# Patient Record
Sex: Male | Born: 1935 | Race: White | Hispanic: No | Marital: Married | Smoking: Former smoker
Health system: Southern US, Community
[De-identification: ages and names within clinical notes are randomized; demographics above are authoritative.]

## PROBLEM LIST (undated history)

## (undated) DIAGNOSIS — I10 Essential (primary) hypertension: Secondary | ICD-10-CM

## (undated) DIAGNOSIS — K5790 Diverticulosis of intestine, part unspecified, without perforation or abscess without bleeding: Secondary | ICD-10-CM

## (undated) DIAGNOSIS — Z860101 Personal history of adenomatous and serrated colon polyps: Secondary | ICD-10-CM

## (undated) DIAGNOSIS — I350 Nonrheumatic aortic (valve) stenosis: Secondary | ICD-10-CM

## (undated) DIAGNOSIS — M109 Gout, unspecified: Secondary | ICD-10-CM

## (undated) DIAGNOSIS — Z86010 Personal history of colonic polyps: Secondary | ICD-10-CM

## (undated) DIAGNOSIS — I6529 Occlusion and stenosis of unspecified carotid artery: Secondary | ICD-10-CM

## (undated) DIAGNOSIS — M1990 Unspecified osteoarthritis, unspecified site: Secondary | ICD-10-CM

## (undated) DIAGNOSIS — D509 Iron deficiency anemia, unspecified: Secondary | ICD-10-CM

## (undated) DIAGNOSIS — I4891 Unspecified atrial fibrillation: Secondary | ICD-10-CM

## (undated) DIAGNOSIS — Q2730 Arteriovenous malformation, site unspecified: Secondary | ICD-10-CM

## (undated) DIAGNOSIS — I639 Cerebral infarction, unspecified: Secondary | ICD-10-CM

## (undated) DIAGNOSIS — K648 Other hemorrhoids: Secondary | ICD-10-CM

## (undated) DIAGNOSIS — I00 Rheumatic fever without heart involvement: Secondary | ICD-10-CM

## (undated) DIAGNOSIS — E785 Hyperlipidemia, unspecified: Secondary | ICD-10-CM

## (undated) DIAGNOSIS — D126 Benign neoplasm of colon, unspecified: Secondary | ICD-10-CM

## (undated) HISTORY — DX: Hyperlipidemia, unspecified: E78.5

## (undated) HISTORY — PX: COLONOSCOPY: SHX174

## (undated) HISTORY — DX: Other hemorrhoids: K64.8

## (undated) HISTORY — DX: Diverticulosis of intestine, part unspecified, without perforation or abscess without bleeding: K57.90

## (undated) HISTORY — DX: Unspecified atrial fibrillation: I48.91

## (undated) HISTORY — DX: Personal history of colonic polyps: Z86.010

## (undated) HISTORY — DX: Nonrheumatic aortic (valve) stenosis: I35.0

## (undated) HISTORY — DX: Iron deficiency anemia, unspecified: D50.9

## (undated) HISTORY — DX: Arteriovenous malformation, site unspecified: Q27.30

## (undated) HISTORY — DX: Essential (primary) hypertension: I10

## (undated) HISTORY — DX: Personal history of adenomatous and serrated colon polyps: Z86.0101

## (undated) HISTORY — PX: ELBOW SURGERY: SHX618

## (undated) HISTORY — DX: Unspecified osteoarthritis, unspecified site: M19.90

## (undated) HISTORY — DX: Benign neoplasm of colon, unspecified: D12.6

---
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
## 2002-01-31 ENCOUNTER — Encounter: Payer: Self-pay | Admitting: *Deleted

## 2002-01-31 ENCOUNTER — Encounter: Payer: Self-pay | Admitting: Emergency Medicine

## 2002-01-31 ENCOUNTER — Emergency Department (HOSPITAL_COMMUNITY): Admission: EM | Admit: 2002-01-31 | Discharge: 2002-01-31 | Payer: Self-pay | Admitting: Emergency Medicine

## 2002-02-02 ENCOUNTER — Ambulatory Visit (HOSPITAL_BASED_OUTPATIENT_CLINIC_OR_DEPARTMENT_OTHER): Admission: RE | Admit: 2002-02-02 | Discharge: 2002-02-02 | Payer: Self-pay | Admitting: *Deleted

## 2002-07-23 ENCOUNTER — Encounter: Payer: Self-pay | Admitting: Family Medicine

## 2002-07-23 ENCOUNTER — Ambulatory Visit (HOSPITAL_COMMUNITY): Admission: RE | Admit: 2002-07-23 | Discharge: 2002-07-23 | Payer: Self-pay | Admitting: Family Medicine

## 2002-10-14 ENCOUNTER — Encounter: Payer: Self-pay | Admitting: Neurosurgery

## 2002-10-14 ENCOUNTER — Encounter: Admission: RE | Admit: 2002-10-14 | Discharge: 2002-10-14 | Payer: Self-pay | Admitting: Neurosurgery

## 2002-10-28 ENCOUNTER — Encounter: Payer: Self-pay | Admitting: Neurosurgery

## 2002-10-28 ENCOUNTER — Encounter: Admission: RE | Admit: 2002-10-28 | Discharge: 2002-10-28 | Payer: Self-pay | Admitting: Neurosurgery

## 2003-10-21 ENCOUNTER — Ambulatory Visit (HOSPITAL_COMMUNITY): Admission: RE | Admit: 2003-10-21 | Discharge: 2003-10-21 | Payer: Self-pay | Admitting: Family Medicine

## 2004-04-11 ENCOUNTER — Ambulatory Visit (HOSPITAL_COMMUNITY): Admission: RE | Admit: 2004-04-11 | Discharge: 2004-04-11 | Payer: Self-pay | Admitting: Neurosurgery

## 2004-05-03 ENCOUNTER — Encounter: Admission: RE | Admit: 2004-05-03 | Discharge: 2004-05-03 | Payer: Self-pay | Admitting: Neurosurgery

## 2004-05-17 ENCOUNTER — Encounter: Admission: RE | Admit: 2004-05-17 | Discharge: 2004-05-17 | Payer: Self-pay | Admitting: Neurosurgery

## 2005-02-05 ENCOUNTER — Ambulatory Visit (HOSPITAL_COMMUNITY): Admission: RE | Admit: 2005-02-05 | Discharge: 2005-02-05 | Payer: Self-pay | Admitting: Family Medicine

## 2005-11-14 ENCOUNTER — Ambulatory Visit (HOSPITAL_COMMUNITY): Admission: RE | Admit: 2005-11-14 | Discharge: 2005-11-14 | Payer: Self-pay | Admitting: Family Medicine

## 2006-05-16 ENCOUNTER — Ambulatory Visit: Payer: Self-pay | Admitting: Gastroenterology

## 2006-05-29 ENCOUNTER — Encounter: Payer: Self-pay | Admitting: Gastroenterology

## 2006-05-29 ENCOUNTER — Ambulatory Visit: Payer: Self-pay | Admitting: Gastroenterology

## 2009-05-03 ENCOUNTER — Encounter (INDEPENDENT_AMBULATORY_CARE_PROVIDER_SITE_OTHER): Payer: Self-pay | Admitting: *Deleted

## 2009-09-27 ENCOUNTER — Ambulatory Visit (HOSPITAL_COMMUNITY): Admission: RE | Admit: 2009-09-27 | Discharge: 2009-09-27 | Payer: Self-pay | Admitting: Family Medicine

## 2010-12-29 NOTE — Op Note (Signed)
[place]. [facility]  Patient:    BERVIN, KRONINGER Visit Number: [id number] MRN: [id number]          Service Type: DSU Location: [facility] Attending Physician:  Carlisle Cater Dictated by:   Daryl Eastern, M.D. Proc. Date: 02/02/02 Admit Date:  02/02/2002   CC:         Bonne Dolores, M.D.   Operative Report  PREOPERATIVE DIAGNOSIS:  Crush injury with avulsion fracture of extensor tendon, distal phalanx, right index finger.  POSTOPERATIVE DIAGNOSIS:  Crush injury with avulsion fracture of extensor tendon, distal phalanx, right index finger.  OPERATION PERFORMED:  Percutaneous pinning of distal interphalangeal joint right index finger with secondary closure of wound.  SURGEON:  Daryl Eastern, M.D.  ANESTHESIA:  0.5% local with sedation.  OPERATIVE FINDINGS:  The patient had a wood splitter injury with an avulsion fracture at the extensor tendon insertion on the distal phalanx.  The skin was fairly macerated and  the fracture was dorsally displaced.  DESCRIPTION OF PROCEDURE:  Under 0.5% Marcaine local anesthesia the right arm was prepped and draped in the usual fashion and the sutures were removed.  The DIP joint was pinned with a 4-5 K-wire percutaneously from the tip across the DIP joint with the DIP joint held in hyperextension.  The pin was bent over and left protruding from the skin after x-rays showed good alignment.  The fracture fragment was reduced and was manually pushed into place.  The joint had been irrigated with saline and the skin was closed with 4-0 nylon sutures. Sterile dressings were applied followed by finger splints.  The patient tolerated the procedure well and went to the recovery room awake and stable in good condition. Dictated by:   Daryl Eastern, M.D. Attending Physician:  Carlisle Cater DD:  02/02/02 TD:  02/02/02 Job: [id number]

## 2011-05-18 ENCOUNTER — Encounter: Payer: Self-pay | Admitting: Gastroenterology

## 2011-06-21 ENCOUNTER — Encounter: Payer: Self-pay | Admitting: Gastroenterology

## 2011-06-21 ENCOUNTER — Ambulatory Visit (INDEPENDENT_AMBULATORY_CARE_PROVIDER_SITE_OTHER): Payer: Medicare Other | Admitting: Gastroenterology

## 2011-06-21 VITALS — BP 152/70 | HR 60 | Wt 210.6 lb

## 2011-06-21 DIAGNOSIS — Z860100 Personal history of colon polyps, unspecified: Secondary | ICD-10-CM

## 2011-06-21 DIAGNOSIS — K5730 Diverticulosis of large intestine without perforation or abscess without bleeding: Secondary | ICD-10-CM

## 2011-06-21 NOTE — Patient Instructions (Signed)
Please call the office after the first of the year (January) to schedule your colonoscopy.  CC: Delman Cheadle M.D.  Diverticulosis Diverticulosis is a common condition that develops when small pouches (diverticula) form in the wall of the colon. The risk of diverticulosis increases with age. It happens more often in people who eat a low-fiber diet. Most individuals with diverticulosis have no symptoms. Those individuals with symptoms usually experience abdominal pain, constipation, or loose stools (diarrhea). HOME CARE INSTRUCTIONS   Increase the amount of fiber in your diet as directed by your caregiver or dietician. This may reduce symptoms of diverticulosis.   Your caregiver may recommend taking a dietary fiber supplement.   Drink at least 6 to 8 glasses of water each day to prevent constipation.   Try not to strain when you have a bowel movement.   Your caregiver may recommend avoiding nuts and seeds to prevent complications, although this is still an uncertain benefit.   Only take over-the-counter or prescription medicines for pain, discomfort, or fever as directed by your caregiver.  FOODS WITH HIGH FIBER CONTENT INCLUDE:  Fruits. Apple, peach, pear, tangerine, raisins, prunes.   Vegetables. Brussels sprouts, asparagus, broccoli, cabbage, carrot, cauliflower, romaine lettuce, spinach, summer squash, tomato, winter squash, zucchini.   Starchy Vegetables. Baked beans, kidney beans, lima beans, split peas, lentils, potatoes (with skin).   Grains. Whole wheat bread, brown rice, bran flake cereal, plain oatmeal, white rice, shredded wheat, bran muffins.  SEEK IMMEDIATE MEDICAL CARE IF:   You develop increasing pain or severe bloating.   You have an oral temperature above 102 F (38.9 C), not controlled by medicine.   You develop vomiting or bowel movements that are bloody or black.  Document Released: 04/26/2004 Document Revised: 04/11/2011 Document Reviewed:  12/28/2009 [facility] Patient Information 2012 [place].

## 2011-06-21 NOTE — Progress Notes (Signed)
This is a  extremely pleasant 77 year old Caucasian male in excellent health a sister mild hypertension. 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 Family history is noncontributory. He denies any GI complaints except for mild constipation relieved by herbal tea. He specifically denies rectal bleeding, melena, anorexia, weight loss, upper GI, hepatobiliary, or systemic complaints.  Current Medications, Allergies, Past Medical History, Past Surgical History, Family History and Social History were reviewed in [employer] record.  Pertinent Review of Systems Negative   Physical Exam: Healthy-appearing patient appearing younger than his stated age. I cannot appreciate stigmata of chronic liver disease. Chest is clear he is in a regular rhythm without murmurs gallops or rubs. I cannot appreciate hepatosplenomegaly, abdominal masses or tenderness. Bowel sounds are normal. Peripheral extremities are unremarkable and mental status is normal.    Assessment and Plan: We'll schedule followup colonoscopy in this extremely healthy 77 year old Caucasian male with mild essential hypertension. Blood pressure today on therapy is 152/70 and pulse is 60 and regular. He had a previous serrated adenoma, and this does increase his risk for colon cancer. I have reviewed colonoscopy, balanced electrolyte preparation, IV sedation, and he is agreed to proceed as planned. Patient is not on aspirin or other anticoagulants. Continue his other medications as listed and reviewed in his record.  Please copy her primary care physician, referring physician, and pertinent subspecialists. Encounter Diagnoses  Name Primary?  . Personal history of colonic polyps Yes  . Diverticulosis of colon (without mention of hemorrhage)

## 2011-08-30 DIAGNOSIS — D235 Other benign neoplasm of skin of trunk: Secondary | ICD-10-CM | POA: Diagnosis not present

## 2011-08-30 DIAGNOSIS — L259 Unspecified contact dermatitis, unspecified cause: Secondary | ICD-10-CM | POA: Diagnosis not present

## 2011-08-30 DIAGNOSIS — L570 Actinic keratosis: Secondary | ICD-10-CM | POA: Diagnosis not present

## 2011-09-27 DIAGNOSIS — E119 Type 2 diabetes mellitus without complications: Secondary | ICD-10-CM | POA: Diagnosis not present

## 2011-09-27 DIAGNOSIS — L820 Inflamed seborrheic keratosis: Secondary | ICD-10-CM | POA: Diagnosis not present

## 2011-09-27 DIAGNOSIS — M069 Rheumatoid arthritis, unspecified: Secondary | ICD-10-CM | POA: Diagnosis not present

## 2011-09-27 DIAGNOSIS — Z6828 Body mass index (BMI) 28.0-28.9, adult: Secondary | ICD-10-CM | POA: Diagnosis not present

## 2011-09-27 DIAGNOSIS — L570 Actinic keratosis: Secondary | ICD-10-CM | POA: Diagnosis not present

## 2011-09-27 DIAGNOSIS — I10 Essential (primary) hypertension: Secondary | ICD-10-CM | POA: Diagnosis not present

## 2011-09-27 DIAGNOSIS — E785 Hyperlipidemia, unspecified: Secondary | ICD-10-CM | POA: Diagnosis not present

## 2011-10-15 DIAGNOSIS — E785 Hyperlipidemia, unspecified: Secondary | ICD-10-CM | POA: Diagnosis not present

## 2011-10-15 DIAGNOSIS — Z125 Encounter for screening for malignant neoplasm of prostate: Secondary | ICD-10-CM | POA: Diagnosis not present

## 2011-10-15 DIAGNOSIS — E119 Type 2 diabetes mellitus without complications: Secondary | ICD-10-CM | POA: Diagnosis not present

## 2011-10-15 DIAGNOSIS — I10 Essential (primary) hypertension: Secondary | ICD-10-CM | POA: Diagnosis not present

## 2011-12-12 ENCOUNTER — Encounter: Payer: Self-pay | Admitting: Gastroenterology

## 2011-12-21 ENCOUNTER — Ambulatory Visit: Payer: Medicare Other | Admitting: Gastroenterology

## 2012-01-16 ENCOUNTER — Encounter: Payer: Medicare Other | Admitting: Gastroenterology

## 2012-01-21 ENCOUNTER — Ambulatory Visit (AMBULATORY_SURGERY_CENTER): Payer: Medicare Other | Admitting: *Deleted

## 2012-01-21 VITALS — Ht 74.0 in | Wt 223.0 lb

## 2012-01-21 DIAGNOSIS — Z860100 Personal history of colon polyps, unspecified: Secondary | ICD-10-CM

## 2012-01-21 DIAGNOSIS — Z1211 Encounter for screening for malignant neoplasm of colon: Secondary | ICD-10-CM

## 2012-01-21 MED ORDER — PEG-KCL-NACL-NASULF-NA ASC-C 100 G PO SOLR: ORAL | Status: DC

## 2012-02-04 ENCOUNTER — Ambulatory Visit (AMBULATORY_SURGERY_CENTER): Payer: Medicare Other | Admitting: Gastroenterology

## 2012-02-04 VITALS — BP 146/75 | HR 79 | Temp 98.2°F | Resp 16 | Ht 74.0 in | Wt 223.0 lb

## 2012-02-04 DIAGNOSIS — I10 Essential (primary) hypertension: Secondary | ICD-10-CM | POA: Diagnosis not present

## 2012-02-04 DIAGNOSIS — E785 Hyperlipidemia, unspecified: Secondary | ICD-10-CM | POA: Diagnosis not present

## 2012-02-04 DIAGNOSIS — K5520 Angiodysplasia of colon without hemorrhage: Secondary | ICD-10-CM

## 2012-02-04 DIAGNOSIS — D126 Benign neoplasm of colon, unspecified: Secondary | ICD-10-CM | POA: Diagnosis not present

## 2012-02-04 DIAGNOSIS — K5730 Diverticulosis of large intestine without perforation or abscess without bleeding: Secondary | ICD-10-CM | POA: Diagnosis not present

## 2012-02-04 DIAGNOSIS — Z1211 Encounter for screening for malignant neoplasm of colon: Secondary | ICD-10-CM

## 2012-02-04 DIAGNOSIS — Q2733 Arteriovenous malformation of digestive system vessel: Secondary | ICD-10-CM | POA: Diagnosis not present

## 2012-02-04 DIAGNOSIS — Z860100 Personal history of colon polyps, unspecified: Secondary | ICD-10-CM

## 2012-02-04 MED ORDER — SODIUM CHLORIDE 0.9 % IV SOLN: 500.0000 mL | INTRAVENOUS | Status: DC

## 2012-02-04 NOTE — Progress Notes (Signed)
Patient did not experience any of the following events: a burn prior to discharge; a fall within the facility; wrong site/side/patient/procedure/implant event; or a hospital transfer or hospital admission upon discharge from the facility. (G8907) Patient did not have preoperative order for IV antibiotic SSI prophylaxis. (G8918)  

## 2012-02-04 NOTE — Op Note (Signed)
[facility] [address] [employer] [address]. [place], [place]  [id number]  COLONOSCOPY PROCEDURE REPORT  PATIENT:  Ronald Colon, Ronald Colon  MR#:  [id number] BIRTHDATE:  1929-04-09, 83 yrs. old  GENDER:  male ENDOSCOPIST:  Loralee Pacas. Sharlett Iles, MD, [facility] REF. BY:  Stanford Scotland, NP PROCEDURE DATE:  02/04/2012 PROCEDURE:  Colonoscopy with snare polypectomy ASA CLASS:  Class III INDICATIONS:  history of pre-cancerous (adenomatous) colon polyps  MEDICATIONS:   propofol (Diprivan) 170 mg IV  DESCRIPTION OF PROCEDURE:   After the risks and benefits and of the procedure were explained, informed consent was obtained. Digital rectal exam was performed and revealed no abnormalities. The LB PCF-H180AL [id number] endoscope was introduced through the anus and advanced to the cecum, which was identified by both the appendix and ileocecal valve.  The quality of the prep was good, using MoviPrep.  The instrument was then slowly withdrawn as the colon was fully examined. <<PROCEDUREIMAGES>>  FINDINGS:  There were mild diverticular changes in left colon. diverticulosis was found.  A sessile polyp was found in the distal transverse colon. A 7MM FLAT POLYP HOT SNARE REMOVED FROM DISTAL TV COLON AREA.  A diminutive polyp was found in the rectum. SMALL NODULE ABLATED WITH CAUTERY.IN RECTUM.  This was otherwise a normal examination of the colon.  Arteriovenous malformations were seen in the in the cecum. SEE PICTURES.   Retroflexed views in the rectum revealed no abnormalities.    The scope was then withdrawn from the patient and the procedure completed.  COMPLICATIONS:  None ENDOSCOPIC IMPRESSION: 1) Diverticulosis,mild,left sided diverticulosis 2) Sessile polyp in the distal transverse colon 3) Diminutive polyp in the rectum 4) Otherwise normal examination R/O ADENOMAS. RECOMMENDATIONS: 1) Await biopsy results 2) High fiber diet. F/U PER AGE AND PATH RESULTS.  REPEAT EXAM:   No  ______________________________ Loralee Pacas. Sharlett Iles, MD, [facility]  CC:  n. eSIGNED:   Loralee Pacas. Antavia Tandy at 02/04/2012 11:17 AM  Gavin Pound, [id number]

## 2012-02-04 NOTE — Progress Notes (Signed)
The pt tolerated the colonoscopy very well. Maw   

## 2012-02-04 NOTE — Patient Instructions (Signed)
YOU HAD AN ENDOSCOPIC PROCEDURE TODAY AT THE [facility]: Refer to the procedure report that was given to you for any specific questions about what was found during the examination.  If the procedure report does not answer your questions, please call your gastroenterologist to clarify.  If you requested that your care partner not be given the details of your procedure findings, then the procedure report has been included in a sealed envelope for you to review at your convenience later.  YOU SHOULD EXPECT: Some feelings of bloating in the abdomen. Passage of more gas than usual.  Walking can help get rid of the air that was put into your GI tract during the procedure and reduce the bloating. If you had a lower endoscopy (such as a colonoscopy or flexible sigmoidoscopy) you may notice spotting of blood in your stool or on the toilet paper. If you underwent a bowel prep for your procedure, then you may not have a normal bowel movement for a few days.  DIET: Your first meal following the procedure should be a light meal and then it is ok to progress to your normal diet.  A half-sandwich or bowl of soup is an example of a good first meal.  Heavy or fried foods are harder to digest and may make you feel nauseous or bloated.  Likewise meals heavy in dairy and vegetables can cause extra gas to form and this can also increase the bloating.  Drink plenty of fluids but you should avoid alcoholic beverages for 24 hours.  ACTIVITY: Your care partner should take you home directly after the procedure.  You should plan to take it easy, moving slowly for the rest of the day.  You can resume normal activity the day after the procedure however you should NOT DRIVE or use heavy machinery for 24 hours (because of the sedation medicines used during the test).    SYMPTOMS TO REPORT IMMEDIATELY: A gastroenterologist can be reached at any hour.  During normal business hours, 8:30 AM to 5:00 PM Monday through Friday,  call [phone].  After hours and on weekends, please call the GI answering service at [phone] who will take a message and have the physician on call contact you.   Following lower endoscopy (colonoscopy or flexible sigmoidoscopy):  Excessive amounts of blood in the stool  Significant tenderness or worsening of abdominal pains  Swelling of the abdomen that is new, acute  Fever of 100F or higher    FOLLOW UP: If any biopsies were taken you will be contacted by phone or by letter within the next 1-3 weeks.  Call your gastroenterologist if you have not heard about the biopsies in 3 weeks.  Our staff will call the home number listed on your records the next business day following your procedure to check on you and address any questions or concerns that you may have at that time regarding the information given to you following your procedure. This is a courtesy call and so if there is no answer at the home number and we have not heard from you through the emergency physician on call, we will assume that you have returned to your regular daily activities without incident.  SIGNATURES/CONFIDENTIALITY: You and/or your care partner have signed paperwork which will be entered into your electronic medical record.  These signatures attest to the fact that that the information above on your After Visit Summary has been reviewed and is understood.  Full responsibility of the confidentiality   of this discharge information lies with you and/or your care-partner.     INFORMATION ON POLYPS, DIVERTICULOSIS, & HIGH FIBER DIET GIVEN TO YOU TODAY

## 2012-02-05 ENCOUNTER — Telehealth: Payer: Self-pay

## 2012-02-05 NOTE — Telephone Encounter (Signed)
  Follow up Call-  Call back number 02/04/2012  Post procedure Call Back phone  # [phone], [phone] cell  Permission to leave phone message Yes     Patient questions:  Do you have a fever, pain , or abdominal swelling? no Pain Score  0 *  Have you tolerated food without any problems? no  Have you been able to return to your normal activities? yes  Do you have any questions about your discharge instructions: Diet   no Medications  no Follow up visit  no  Do you have questions or concerns about your Care? no  Actions: * If pain score is 4 or above: No action needed, pain <4. Per the pt, "I did just fine.  I was real pleased how things went yesterday.  Everyone was real nice". Maw

## 2012-02-08 ENCOUNTER — Encounter: Payer: Self-pay | Admitting: Gastroenterology

## 2012-03-27 DIAGNOSIS — D235 Other benign neoplasm of skin of trunk: Secondary | ICD-10-CM | POA: Diagnosis not present

## 2012-03-27 DIAGNOSIS — L570 Actinic keratosis: Secondary | ICD-10-CM | POA: Diagnosis not present

## 2012-03-27 DIAGNOSIS — L820 Inflamed seborrheic keratosis: Secondary | ICD-10-CM | POA: Diagnosis not present

## 2012-04-24 DIAGNOSIS — Z6828 Body mass index (BMI) 28.0-28.9, adult: Secondary | ICD-10-CM | POA: Diagnosis not present

## 2012-04-24 DIAGNOSIS — E785 Hyperlipidemia, unspecified: Secondary | ICD-10-CM | POA: Diagnosis not present

## 2012-04-24 DIAGNOSIS — I10 Essential (primary) hypertension: Secondary | ICD-10-CM | POA: Diagnosis not present

## 2012-04-24 DIAGNOSIS — M159 Polyosteoarthritis, unspecified: Secondary | ICD-10-CM | POA: Diagnosis not present

## 2012-04-25 DIAGNOSIS — E119 Type 2 diabetes mellitus without complications: Secondary | ICD-10-CM | POA: Diagnosis not present

## 2012-04-25 DIAGNOSIS — I10 Essential (primary) hypertension: Secondary | ICD-10-CM | POA: Diagnosis not present

## 2012-04-25 DIAGNOSIS — E785 Hyperlipidemia, unspecified: Secondary | ICD-10-CM | POA: Diagnosis not present

## 2012-06-11 DIAGNOSIS — Z6828 Body mass index (BMI) 28.0-28.9, adult: Secondary | ICD-10-CM | POA: Diagnosis not present

## 2012-06-11 DIAGNOSIS — M130 Polyarthritis, unspecified: Secondary | ICD-10-CM | POA: Diagnosis not present

## 2012-06-11 DIAGNOSIS — E782 Mixed hyperlipidemia: Secondary | ICD-10-CM | POA: Diagnosis not present

## 2012-06-11 DIAGNOSIS — M109 Gout, unspecified: Secondary | ICD-10-CM | POA: Diagnosis not present

## 2012-06-11 DIAGNOSIS — Z23 Encounter for immunization: Secondary | ICD-10-CM | POA: Diagnosis not present

## 2012-06-20 ENCOUNTER — Ambulatory Visit (HOSPITAL_COMMUNITY)
Admission: RE | Admit: 2012-06-20 | Discharge: 2012-06-20 | Disposition: A | Discharge status: Home / Self Care | Payer: Medicare Other | Source: Ambulatory Visit | Attending: Family Medicine | Admitting: Family Medicine

## 2012-06-20 ENCOUNTER — Other Ambulatory Visit (HOSPITAL_COMMUNITY): Payer: Self-pay | Admitting: Family Medicine

## 2012-06-20 DIAGNOSIS — M109 Gout, unspecified: Secondary | ICD-10-CM | POA: Diagnosis not present

## 2012-06-20 DIAGNOSIS — Z6828 Body mass index (BMI) 28.0-28.9, adult: Secondary | ICD-10-CM | POA: Diagnosis not present

## 2012-06-20 DIAGNOSIS — S8000XA Contusion of unspecified knee, initial encounter: Secondary | ICD-10-CM

## 2012-06-20 DIAGNOSIS — S8990XA Unspecified injury of unspecified lower leg, initial encounter: Secondary | ICD-10-CM | POA: Diagnosis not present

## 2012-06-20 DIAGNOSIS — S99929A Unspecified injury of unspecified foot, initial encounter: Secondary | ICD-10-CM | POA: Diagnosis not present

## 2012-06-20 DIAGNOSIS — M7989 Other specified soft tissue disorders: Secondary | ICD-10-CM | POA: Diagnosis not present

## 2012-06-20 DIAGNOSIS — X58XXXA Exposure to other specified factors, initial encounter: Secondary | ICD-10-CM | POA: Insufficient documentation

## 2012-06-20 DIAGNOSIS — S99919A Unspecified injury of unspecified ankle, initial encounter: Secondary | ICD-10-CM | POA: Diagnosis not present

## 2012-07-09 DIAGNOSIS — J069 Acute upper respiratory infection, unspecified: Secondary | ICD-10-CM | POA: Diagnosis not present

## 2012-07-09 DIAGNOSIS — Z6828 Body mass index (BMI) 28.0-28.9, adult: Secondary | ICD-10-CM | POA: Diagnosis not present

## 2012-09-23 DIAGNOSIS — H538 Other visual disturbances: Secondary | ICD-10-CM | POA: Diagnosis not present

## 2012-09-23 DIAGNOSIS — H2510 Age-related nuclear cataract, unspecified eye: Secondary | ICD-10-CM | POA: Diagnosis not present

## 2012-10-23 DIAGNOSIS — L820 Inflamed seborrheic keratosis: Secondary | ICD-10-CM | POA: Diagnosis not present

## 2012-10-23 DIAGNOSIS — L570 Actinic keratosis: Secondary | ICD-10-CM | POA: Diagnosis not present

## 2012-11-19 DIAGNOSIS — R21 Rash and other nonspecific skin eruption: Secondary | ICD-10-CM | POA: Diagnosis not present

## 2012-11-19 DIAGNOSIS — Z6828 Body mass index (BMI) 28.0-28.9, adult: Secondary | ICD-10-CM | POA: Diagnosis not present

## 2012-11-19 DIAGNOSIS — M159 Polyosteoarthritis, unspecified: Secondary | ICD-10-CM | POA: Diagnosis not present

## 2012-11-19 DIAGNOSIS — E119 Type 2 diabetes mellitus without complications: Secondary | ICD-10-CM | POA: Diagnosis not present

## 2012-11-19 DIAGNOSIS — I10 Essential (primary) hypertension: Secondary | ICD-10-CM | POA: Diagnosis not present

## 2012-11-19 DIAGNOSIS — G5600 Carpal tunnel syndrome, unspecified upper limb: Secondary | ICD-10-CM | POA: Diagnosis not present

## 2012-12-25 ENCOUNTER — Ambulatory Visit: Payer: Medicare Other | Attending: Internal Medicine

## 2013-04-07 DIAGNOSIS — M549 Dorsalgia, unspecified: Secondary | ICD-10-CM | POA: Diagnosis not present

## 2013-04-07 DIAGNOSIS — M1990 Unspecified osteoarthritis, unspecified site: Secondary | ICD-10-CM | POA: Diagnosis not present

## 2013-04-07 DIAGNOSIS — Z6828 Body mass index (BMI) 28.0-28.9, adult: Secondary | ICD-10-CM | POA: Diagnosis not present

## 2013-04-07 DIAGNOSIS — R141 Gas pain: Secondary | ICD-10-CM | POA: Diagnosis not present

## 2013-06-10 DIAGNOSIS — I10 Essential (primary) hypertension: Secondary | ICD-10-CM | POA: Diagnosis not present

## 2013-06-10 DIAGNOSIS — M2550 Pain in unspecified joint: Secondary | ICD-10-CM | POA: Diagnosis not present

## 2013-06-10 DIAGNOSIS — I499 Cardiac arrhythmia, unspecified: Secondary | ICD-10-CM | POA: Diagnosis not present

## 2013-06-10 DIAGNOSIS — Z6827 Body mass index (BMI) 27.0-27.9, adult: Secondary | ICD-10-CM | POA: Diagnosis not present

## 2013-06-11 DIAGNOSIS — Z6827 Body mass index (BMI) 27.0-27.9, adult: Secondary | ICD-10-CM | POA: Diagnosis not present

## 2013-06-11 DIAGNOSIS — Z125 Encounter for screening for malignant neoplasm of prostate: Secondary | ICD-10-CM | POA: Diagnosis not present

## 2013-06-11 DIAGNOSIS — G8929 Other chronic pain: Secondary | ICD-10-CM | POA: Diagnosis not present

## 2013-06-11 DIAGNOSIS — I10 Essential (primary) hypertension: Secondary | ICD-10-CM | POA: Diagnosis not present

## 2013-06-11 DIAGNOSIS — Z23 Encounter for immunization: Secondary | ICD-10-CM | POA: Diagnosis not present

## 2013-07-02 ENCOUNTER — Ambulatory Visit: Payer: Medicare Other | Admitting: Cardiology

## 2013-08-11 DIAGNOSIS — E669 Obesity, unspecified: Secondary | ICD-10-CM | POA: Diagnosis not present

## 2013-08-11 DIAGNOSIS — E119 Type 2 diabetes mellitus without complications: Secondary | ICD-10-CM | POA: Diagnosis not present

## 2013-08-11 DIAGNOSIS — K219 Gastro-esophageal reflux disease without esophagitis: Secondary | ICD-10-CM | POA: Diagnosis not present

## 2013-08-12 ENCOUNTER — Encounter: Payer: Self-pay | Admitting: *Deleted

## 2013-08-18 ENCOUNTER — Ambulatory Visit (INDEPENDENT_AMBULATORY_CARE_PROVIDER_SITE_OTHER): Payer: Medicare Other | Admitting: Cardiology

## 2013-08-18 ENCOUNTER — Encounter: Payer: Self-pay | Admitting: Cardiology

## 2013-08-18 VITALS — BP 155/76 | HR 96 | Ht 74.0 in | Wt 207.1 lb

## 2013-08-18 DIAGNOSIS — I499 Cardiac arrhythmia, unspecified: Secondary | ICD-10-CM | POA: Diagnosis not present

## 2013-08-18 DIAGNOSIS — R011 Cardiac murmur, unspecified: Secondary | ICD-10-CM

## 2013-08-18 DIAGNOSIS — I10 Essential (primary) hypertension: Secondary | ICD-10-CM

## 2013-08-18 NOTE — Progress Notes (Signed)
Clinical Summary Mr. Ronald Colon is an 80 y.o.male referred for cardiology consultation by Dr. Hilma Favors. He has a reported long-standing history of cardiac murmur, recognized when he was trying to get into the [employer], he was declined based on his cardiac evaluation and asked if he had had history of rheumatic heart disease. He does recall a febrile illness as a child. Otherwise he does not indicate any limiting shortness of breath, no chest pain or palpitations, no syncope. He states that he is active, just hunted recently during deer season, was able to climb up into dear stands and drag deers out of the woods.  ECG from October 2014 showed sinus rhythm with PACs, left anterior fascicular block, incomplete right bundle branch block. Lab work at that time revealed hemoglobin 13.5, platelets 260, potassium 4.6, BUN 10, currently 0.9, normal AST and ALT, cholesterol 159, triglycerides 219, HDL 35, LDL 123.  He has never had an echocardiogram.  No Known Allergies  Current Outpatient Prescriptions  Medication Sig Dispense Refill  . acetaminophen (TYLENOL) 500 MG tablet Take 500 mg by mouth every 6 (six) hours as needed.      Marland Kitchen allopurinol (ZYLOPRIM) 300 MG tablet Take 300 mg by mouth daily.      Marland Kitchen amLODipine-olmesartan (AZOR) 5-40 MG per tablet Take 1 tablet by mouth daily.        Marland Kitchen COLCRYS 0.6 MG tablet Take 1 tablet by mouth as needed.      . ezetimibe (ZETIA) 10 MG tablet Take 10 mg by mouth daily.      . fish oil-omega-3 fatty acids 1000 MG capsule Take 1 g by mouth daily.      . predniSONE (DELTASONE) 10 MG tablet Take 1 tablet by mouth as needed.      . traZODone (DESYREL) 50 MG tablet Take 50 mg by mouth at bedtime as needed.        No current facility-administered medications for this visit.    Past Medical History  Diagnosis Date  . Diverticulosis   . Colon polyp   . Arthritis   . Essential hypertension, benign   . Hyperlipidemia     Past Surgical History  Procedure Laterality Date   . Elbow surgery      Family History  Problem Relation Age of Onset  . Colon cancer Neg Hx     Social History Mr. Vanorden reports that he has quit smoking. His smoking use included Cigarettes. He smoked 0.00 packs per day. He has never used smokeless tobacco. Mr. Fahey reports that he does not drink alcohol.  Review of Systems Arthritic pains are his main limitation. No bleeding problems. Stable appetite. Otherwise negative except as outlined.  Physical Examination Filed Vitals:   08/18/13 0806  BP: 155/76  Pulse: 96   Filed Weights   08/18/13 0806  Weight: 207 lb 1.9 oz (93.949 kg)   The patient appears comfortable at rest. HEENT: Conjunctiva and lids normal, oropharynx clear. Neck: Supple, no elevated JVP or carotid bruits, no thyromegaly. Lungs: Clear to auscultation, nonlabored breathing at rest. Cardiac: Regular rate and rhythm, no S3, [id number] at base systolic murmur, no obvious diastolic murmur appreciated, no pericardial rub. Abdomen: Soft, nontender, bowel sounds present, no guarding or rebound. Extremities: No pitting edema, distal pulses 2+. Skin: Warm and dry. Musculoskeletal: No kyphosis. Neuropsychiatric: Alert and oriented x3, affect grossly appropriate. Hard of hearing.   Problem List and Plan   Heart murmur He is asymptomatic, reportedly has a long-standing murmur. On today's  exam I appreciate a soft systolic murmur, no obvious diastolic murmur. He gives a history that could be consistent with rheumatic heart disease as a child, has never had an echocardiogram. ECG shows incomplete right bundle branch and left anterior fascicular block. Plan will be to obtain an echocardiogram and we will inform him of the results and determine if any further testing is needed.  Irregular heartbeat PACs noted on his most recent ECG. He reports no palpitations, no dizziness or syncope. Doubt that further workup beyond the echocardiogram already mentioned is needed at this  time.  Essential hypertension, benign Keep followup with Dr. Hilma Favors.    Satira Sark, M.D., F.A.C.C.

## 2013-08-18 NOTE — Assessment & Plan Note (Signed)
PACs noted on his most recent ECG. He reports no palpitations, no dizziness or syncope. Doubt that further workup beyond the echocardiogram already mentioned is needed at this time.

## 2013-08-18 NOTE — Assessment & Plan Note (Signed)
Keep followup with Dr. Hilma Favors.

## 2013-08-18 NOTE — Assessment & Plan Note (Signed)
He is asymptomatic, reportedly has a long-standing murmur. On today's exam I appreciate a soft systolic murmur, no obvious diastolic murmur. He gives a history that could be consistent with rheumatic heart disease as a child, has never had an echocardiogram. ECG shows incomplete right bundle branch and left anterior fascicular block. Plan will be to obtain an echocardiogram and we will inform him of the results and determine if any further testing is needed.

## 2013-08-18 NOTE — Patient Instructions (Addendum)

## 2013-08-24 ENCOUNTER — Other Ambulatory Visit (INDEPENDENT_AMBULATORY_CARE_PROVIDER_SITE_OTHER): Payer: Self-pay | Admitting: *Deleted

## 2013-08-24 ENCOUNTER — Encounter (INDEPENDENT_AMBULATORY_CARE_PROVIDER_SITE_OTHER): Payer: Self-pay | Admitting: *Deleted

## 2013-08-24 ENCOUNTER — Ambulatory Visit (INDEPENDENT_AMBULATORY_CARE_PROVIDER_SITE_OTHER): Payer: Medicare Other | Admitting: Internal Medicine

## 2013-08-24 ENCOUNTER — Encounter (INDEPENDENT_AMBULATORY_CARE_PROVIDER_SITE_OTHER): Payer: Self-pay | Admitting: Internal Medicine

## 2013-08-24 VITALS — BP 122/62 | HR 76 | Temp 98.2°F | Ht 74.0 in | Wt 209.2 lb

## 2013-08-24 DIAGNOSIS — K219 Gastro-esophageal reflux disease without esophagitis: Secondary | ICD-10-CM

## 2013-08-24 DIAGNOSIS — R634 Abnormal weight loss: Secondary | ICD-10-CM

## 2013-08-24 MED ORDER — DEXLANSOPRAZOLE 60 MG PO CPDR: 60.0000 mg | DELAYED_RELEASE_CAPSULE | Freq: Every day | ORAL | Status: DC

## 2013-08-24 NOTE — Progress Notes (Signed)
Subjective:     Patient ID: Ronald Colon, male   DOB: 1929-02-01, 80 y.o.   MRN: [id number]  HPI referred to our office for GERD. He says when he eats, he has a sick feeling and water will come up in his mouth.  At night when he lies down, he has tenderness in his epigastric region. Symptoms started around the 1st of December after coming off Prednisone for his arthritis. Hx of bleeding ulcer per patient.  No dysphagia. He sometimes he chokes on tea or water. He can lie down at night and sometimes the acid reflux will bubble up in his esophagus. He says he has lost from 222 to  209.2 pounds over the past 3 months.  His appetite is good.  Usually has a BM daily and sometimes he has 2. No melena or bright red rectal bleeding. He says for the past 3 months he was taking Prednisone for his arthritis so he can climb a tree to deer hunt.  02/05/2012 Colonoscopy for person hx of polyps.  Dr. Sharlett Iles:  Mild left sided diverticulosis. Sesile polyp in the distal transverse colon. Diminutive polyp in the rectum. Otherwise normal exam. Biopsy: Tubular adenoma. Negative for high grade dysplasia.   CBC 05/2013 H and H 13.5 and 38.7, platelets 260    Review of Systems see hpi Current Outpatient Prescriptions  Medication Sig Dispense Refill  . acetaminophen (TYLENOL) 500 MG tablet Take 500 mg by mouth every 6 (six) hours as needed.      Marland Kitchen allopurinol (ZYLOPRIM) 300 MG tablet Take 300 mg by mouth daily.      Marland Kitchen amLODipine-olmesartan (AZOR) 5-40 MG per tablet Take 1 tablet by mouth daily.        Marland Kitchen COLCRYS 0.6 MG tablet Take 1 tablet by mouth as needed.      . ezetimibe (ZETIA) 10 MG tablet Take 10 mg by mouth daily.      . fish oil-omega-3 fatty acids 1000 MG capsule Take 1 g by mouth daily.      . ondansetron (ZOFRAN-ODT) 4 MG disintegrating tablet Take 4 mg by mouth every 8 (eight) hours as needed for nausea or vomiting.      . RABEprazole (ACIPHEX) 20 MG tablet Take 20 mg by mouth daily.      . traZODone  (DESYREL) 50 MG tablet Take 50 mg by mouth at bedtime as needed.        No current facility-administered medications for this visit.   No Known Allergies Past Surgical History  Procedure Laterality Date  . Elbow surgery          Objective:   Physical Exam  Filed Vitals:   08/24/13 1438  BP: 122/62  Pulse: 76  Temp: 98.2 F (36.8 C)  Height: 6\' 2"  (1.88 m)  Weight: 209 lb 3.2 oz (94.892 kg)   Alert and oriented. Skin warm and dry. Oral mucosa is moist.   . Sclera anicteric, conjunctivae is pink. Thyroid not enlarged. No cervical lymphadenopathy. Lungs clear. Heart regular rate and rhythm.  Abdomen is soft. Bowel sounds are positive. No hepatomegaly. No abdominal masses felt. Soreness to epigastric region..  No edema to lower extremities.       Assessment:    Uncontrolled GERD. Weight loss. Gastric neoplasm needs to be ruled out. PUD also in the differential.     Plan:    Rx for Dexilant. EGD with Dr. Laural Golden. Dexilant samples x 2 given to patient.

## 2013-08-24 NOTE — Patient Instructions (Signed)
EGD with Dr. Rehman. The risks and benefits such as perforation, bleeding, and infection were reviewed with the patient and is agreeable. 

## 2013-08-27 ENCOUNTER — Encounter (HOSPITAL_COMMUNITY): Payer: Self-pay | Admitting: Pharmacy Technician

## 2013-09-03 ENCOUNTER — Ambulatory Visit (HOSPITAL_COMMUNITY)
Admission: RE | Admit: 2013-09-03 | Discharge: 2013-09-03 | Disposition: A | Discharge status: Home / Self Care | Payer: Medicare Other | Source: Ambulatory Visit | Attending: Internal Medicine | Admitting: Internal Medicine

## 2013-09-03 ENCOUNTER — Encounter (HOSPITAL_COMMUNITY): Payer: Self-pay | Admitting: *Deleted

## 2013-09-03 ENCOUNTER — Encounter (HOSPITAL_COMMUNITY)
Admission: RE | Disposition: A | Discharge status: Home / Self Care | Payer: Self-pay | Source: Ambulatory Visit | Attending: Internal Medicine

## 2013-09-03 DIAGNOSIS — I10 Essential (primary) hypertension: Secondary | ICD-10-CM | POA: Diagnosis not present

## 2013-09-03 DIAGNOSIS — E785 Hyperlipidemia, unspecified: Secondary | ICD-10-CM | POA: Diagnosis not present

## 2013-09-03 DIAGNOSIS — Z8711 Personal history of peptic ulcer disease: Secondary | ICD-10-CM | POA: Diagnosis not present

## 2013-09-03 DIAGNOSIS — K2940 Chronic atrophic gastritis without bleeding: Secondary | ICD-10-CM | POA: Insufficient documentation

## 2013-09-03 DIAGNOSIS — R1013 Epigastric pain: Secondary | ICD-10-CM | POA: Diagnosis not present

## 2013-09-03 DIAGNOSIS — K117 Disturbances of salivary secretion: Secondary | ICD-10-CM

## 2013-09-03 DIAGNOSIS — K219 Gastro-esophageal reflux disease without esophagitis: Secondary | ICD-10-CM

## 2013-09-03 DIAGNOSIS — R634 Abnormal weight loss: Secondary | ICD-10-CM

## 2013-09-03 DIAGNOSIS — R1310 Dysphagia, unspecified: Secondary | ICD-10-CM | POA: Diagnosis not present

## 2013-09-03 HISTORY — PX: ESOPHAGOGASTRODUODENOSCOPY: SHX5428

## 2013-09-03 HISTORY — PX: ESOPHAGEAL DILATION: SHX303

## 2013-09-03 SURGERY — EGD (ESOPHAGOGASTRODUODENOSCOPY)
Anesthesia: Moderate Sedation

## 2013-09-03 MED ORDER — MEPERIDINE HCL 50 MG/ML IJ SOLN
INTRAMUSCULAR | Status: DC | PRN
  Administered 2013-09-03 (×2): 25 mg via INTRAVENOUS

## 2013-09-03 MED ORDER — BUTAMBEN-TETRACAINE-BENZOCAINE 2-2-14 % EX AERO
INHALATION_SPRAY | CUTANEOUS | Status: DC | PRN
  Administered 2013-09-03: 2 via TOPICAL

## 2013-09-03 MED ORDER — MIDAZOLAM HCL 5 MG/5ML IJ SOLN
INTRAMUSCULAR | Status: AC
  Filled 2013-09-03: qty 10

## 2013-09-03 MED ORDER — SODIUM CHLORIDE 0.9 % IV SOLN
INTRAVENOUS | Status: DC
  Administered 2013-09-03: 1000 mL via INTRAVENOUS

## 2013-09-03 MED ORDER — MIDAZOLAM HCL 5 MG/5ML IJ SOLN
INTRAMUSCULAR | Status: DC | PRN
  Administered 2013-09-03 (×2): 1 mg via INTRAVENOUS
  Administered 2013-09-03: 2 mg via INTRAVENOUS
  Administered 2013-09-03: 1 mg via INTRAVENOUS

## 2013-09-03 MED ORDER — MEPERIDINE HCL 50 MG/ML IJ SOLN
INTRAMUSCULAR | Status: AC
  Filled 2013-09-03: qty 1

## 2013-09-03 NOTE — H&P (Addendum)
Ronald Colon is an 80 y.o. male.   Chief Complaint: Patient is here for EGD. HPI: Patient is an 80 year old Caucasian male who presents with a few weeks history of epigastric pain postprandial nausea and waterbrash. He took prednisone last month and symptoms started after he finished it. He has history of peptic ulcer disease with GI bleed 7 years ago. He has good appetite. He denies vomiting melena or rectal bleeding. He has lost 12 pounds in the last 3 months voluntarily. He does not take NSAIDs. Symptoms have not resolved with PPI. He also complains of intermittent dysphagia with solids and liquids.  Past Medical History  Diagnosis Date  . Diverticulosis   . Colon polyp   . Arthritis   . Essential hypertension, benign   . Hyperlipidemia     Past Surgical History  Procedure Laterality Date  . Elbow surgery      Family History  Problem Relation Age of Onset  . Colon cancer Neg Hx    Social History:  reports that he has quit smoking. His smoking use included Cigarettes. He smoked 0.00 packs per day. He has never used smokeless tobacco. He reports that he does not drink alcohol or use illicit drugs.  Allergies: No Known Allergies  Medications Prior to Admission  Medication Sig Dispense Refill  . acetaminophen (TYLENOL) 500 MG tablet Take 500 mg by mouth every 6 (six) hours as needed.      Marland Kitchen allopurinol (ZYLOPRIM) 300 MG tablet Take 300 mg by mouth daily.      Marland Kitchen amLODipine-olmesartan (AZOR) 5-40 MG per tablet Take 1 tablet by mouth daily.        Marland Kitchen COLCRYS 0.6 MG tablet Take 1 tablet by mouth daily as needed.       Marland Kitchen dexlansoprazole (DEXILANT) 60 MG capsule Take 1 capsule (60 mg total) by mouth daily.  90 capsule  3  . ezetimibe (ZETIA) 10 MG tablet Take 10 mg by mouth daily.      . fish oil-omega-3 fatty acids 1000 MG capsule Take 1 g by mouth daily.      . RABEprazole (ACIPHEX) 20 MG tablet Take 20 mg by mouth daily.      . traZODone (DESYREL) 50 MG tablet Take 50 mg by mouth at  bedtime as needed.         No results found for this or any previous visit (from the past 48 hour(s)). No results found.  ROS  Blood pressure 171/79, pulse 77, temperature 98.5 F (36.9 C), temperature source Oral, resp. rate 16, height 6\' 2"  (1.88 m), weight 209 lb (94.802 kg), SpO2 97.00%. Physical Exam  Constitutional: He appears well-developed and well-nourished.  HENT:  Mouth/Throat: Oropharynx is clear and moist.  Eyes: Conjunctivae are normal. No scleral icterus.  Neck: No thyromegaly present.  Cardiovascular: Normal rate, regular rhythm and normal heart sounds.   No murmur heard. Respiratory: Effort normal and breath sounds normal.  GI: Soft. He exhibits no distension and no mass. There is no tenderness.  Musculoskeletal: He exhibits no edema.  Lymphadenopathy:    He has no cervical adenopathy.  Neurological: He is alert.  Skin: Skin is warm and dry.     Assessment/Plan Epigastric pain postprandial hypersalivation. History of peptic ulcer disease. EGD and possible ED.  REHMAN,NAJEEB U 09/03/2013, 10:14 AM

## 2013-09-03 NOTE — Op Note (Signed)
EGD PROCEDURE REPORT  PATIENT:  Ronald Colon  MR#:  [id number] Birthdate:  06-18-1929, 80 y.o., male Endoscopist:  Dr. Rogene Houston, MD Referred By:  Dr. Purvis Kilts, MD  Procedure Date: 09/03/2013  Procedure:   EGD with ED.  Indications:  Patient is a digital Caucasian male with history of bleeding peptic ulcer disease who presents with a few weeks history of epigastric pain postprandial hypersalivation. Symptoms started after he took prednisone for arthritis. He has not responded to PPI. CBC was normal.            Informed Consent:  The risks, benefits, alternatives & imponderables which include, but are not limited to, bleeding, infection, perforation, drug reaction and potential missed lesion have been reviewed.  The potential for biopsy, lesion removal, esophageal dilation, etc. have also been discussed.  Questions have been answered.  All parties agreeable.  Please see history & physical in medical record for more information.  Medications:  Demerol 50 mg IV Versed 5 mg IV Cetacaine spray topically for oropharyngeal anesthesia  Description of procedure:  The endoscope was introduced through the mouth and advanced to the second portion of the duodenum without difficulty or limitations. The mucosal surfaces were surveyed very carefully during advancement of the scope and upon withdrawal.  Findings:  Esophagus:  Mucosa of the esophagus was normal. GE junction was unremarkable without ring or stricture formation. GEJ:  41 cm Stomach:  Stomach was empty and distended very well with insufflation. Folds in the proximal stomach are normal. Examination mucosa gastric body was normal. Antral/prepyloric mucosa revealed edema, erythema, granularity and deformity to pylorus was patent. Angularis fundus and cardia were examined by retroflexing the scope and were normal. Duodenum:  Normal bulbar and post bulbar mucosa.  Therapeutic/Diagnostic Maneuvers Performed:  Biopsy was taken from  antral mucosa for routine histology. Esophagus was dilated by passing 56 [place] Maloney dilator to full insertion. Esophageal mucosa was reexamined post dilation and no mucosal disruption noted.  Complications:  None  Impression: No evidence of esophagitis, ring or stricture formation. Antral gastritis with deformity to pylorus but it is patent. Biopsy taken from antral mucosa. Esophagus dilated by passing 56 [place] Maloney dilator.    Comment; EGD findings would not explain patient's epigastric pain and hypersalivation unless he has poor gastric emptying. If gastric biopsy is unremarkable will proceed with upper abdominal ultrasound.   Recommendations:  Patient will continue Dexilant 60 mg po qam. I will be contacting patient with results of biopsy and further recommendations.  REHMAN,NAJEEB U  09/03/2013  10:44 AM  CC: Dr. Hilma Favors, Betsy Coder, MD & Dr. Rayne Du ref. provider found

## 2013-09-03 NOTE — Discharge Instructions (Signed)
Resume usual medications. When you finish Dexilant samples go back on Rebarazole or AcipHex 20 mg daily before breakfast. Resume usual diet. No driving for 24 hours. Physician will contact you with biopsy results.  Gastrointestinal Endoscopy Care After Refer to this sheet in the next few weeks. These instructions provide you with information on caring for yourself after your procedure. Your caregiver may also give you more specific instructions. Your treatment has been planned according to current medical practices, but problems sometimes occur. Call your caregiver if you have any problems or questions after your procedure. HOME CARE INSTRUCTIONS  If you were given medicine to help you relax (sedative), do not drive, operate machinery, or sign important documents for 24 hours.  Avoid alcohol and hot or warm beverages for the first 24 hours after the procedure.  Only take over-the-counter or prescription medicines for pain, discomfort, or fever as directed by your caregiver. You may resume taking your normal medicines unless your caregiver tells you otherwise. Ask your caregiver when you may resume taking medicines that may cause bleeding, such as aspirin, clopidogrel, or warfarin.  You may return to your normal diet and activities on the day after your procedure, or as directed by your caregiver. Walking may help to reduce any bloated feeling in your abdomen.  Drink enough fluids to keep your urine clear or pale yellow.  You may gargle with salt water if you have a sore throat. SEEK IMMEDIATE MEDICAL CARE IF:  You have severe nausea or vomiting.  You have severe abdominal pain, abdominal cramps that last longer than 6 hours, or abdominal swelling (distention).  You have severe shoulder or back pain.  You have trouble swallowing.  You have shortness of breath, your breathing is shallow, or you are breathing faster than normal.  You have a fever or a rapid heartbeat.  You vomit blood  or material that looks like coffee grounds.  You have bloody, black, or tarry stools. MAKE SURE YOU:  Understand these instructions.  Will watch your condition.  Will get help right away if you are not doing well or get worse.

## 2013-09-08 ENCOUNTER — Encounter (HOSPITAL_COMMUNITY): Payer: Self-pay | Admitting: Internal Medicine

## 2013-09-08 ENCOUNTER — Encounter (INDEPENDENT_AMBULATORY_CARE_PROVIDER_SITE_OTHER): Payer: Self-pay | Admitting: *Deleted

## 2013-09-11 DIAGNOSIS — M549 Dorsalgia, unspecified: Secondary | ICD-10-CM | POA: Diagnosis not present

## 2013-09-11 DIAGNOSIS — J019 Acute sinusitis, unspecified: Secondary | ICD-10-CM | POA: Diagnosis not present

## 2013-09-11 DIAGNOSIS — Z6827 Body mass index (BMI) 27.0-27.9, adult: Secondary | ICD-10-CM | POA: Diagnosis not present

## 2013-09-16 ENCOUNTER — Other Ambulatory Visit: Payer: Medicare Other

## 2013-09-16 ENCOUNTER — Other Ambulatory Visit: Payer: Self-pay

## 2013-09-16 ENCOUNTER — Other Ambulatory Visit (INDEPENDENT_AMBULATORY_CARE_PROVIDER_SITE_OTHER): Payer: Medicare Other

## 2013-09-16 DIAGNOSIS — I369 Nonrheumatic tricuspid valve disorder, unspecified: Secondary | ICD-10-CM | POA: Diagnosis not present

## 2013-09-16 DIAGNOSIS — R011 Cardiac murmur, unspecified: Secondary | ICD-10-CM | POA: Diagnosis not present

## 2013-09-16 DIAGNOSIS — I499 Cardiac arrhythmia, unspecified: Secondary | ICD-10-CM

## 2013-09-16 DIAGNOSIS — I359 Nonrheumatic aortic valve disorder, unspecified: Secondary | ICD-10-CM

## 2013-09-29 ENCOUNTER — Ambulatory Visit: Payer: Medicare Other | Admitting: Cardiology

## 2013-09-30 ENCOUNTER — Encounter: Payer: Medicare Other | Admitting: Cardiology

## 2013-09-30 ENCOUNTER — Encounter: Payer: Self-pay | Admitting: Cardiology

## 2013-09-30 NOTE — Progress Notes (Signed)
Patient canceled.  This encounter was created in error - please disregard. 

## 2013-10-06 ENCOUNTER — Encounter (INDEPENDENT_AMBULATORY_CARE_PROVIDER_SITE_OTHER): Payer: Self-pay | Admitting: *Deleted

## 2013-10-12 DIAGNOSIS — J310 Chronic rhinitis: Secondary | ICD-10-CM | POA: Diagnosis not present

## 2013-11-04 ENCOUNTER — Ambulatory Visit (INDEPENDENT_AMBULATORY_CARE_PROVIDER_SITE_OTHER): Payer: Medicare Other | Admitting: Internal Medicine

## 2013-11-06 DIAGNOSIS — M545 Low back pain, unspecified: Secondary | ICD-10-CM | POA: Diagnosis not present

## 2013-11-06 DIAGNOSIS — M159 Polyosteoarthritis, unspecified: Secondary | ICD-10-CM | POA: Diagnosis not present

## 2013-11-06 DIAGNOSIS — Z6828 Body mass index (BMI) 28.0-28.9, adult: Secondary | ICD-10-CM | POA: Diagnosis not present

## 2013-11-06 DIAGNOSIS — M109 Gout, unspecified: Secondary | ICD-10-CM | POA: Diagnosis not present

## 2013-11-12 ENCOUNTER — Other Ambulatory Visit (HOSPITAL_COMMUNITY): Payer: Self-pay | Admitting: Family Medicine

## 2013-11-12 DIAGNOSIS — M545 Low back pain, unspecified: Secondary | ICD-10-CM

## 2013-11-16 ENCOUNTER — Ambulatory Visit: Payer: Medicare Other | Admitting: Cardiology

## 2013-11-18 ENCOUNTER — Ambulatory Visit (HOSPITAL_COMMUNITY)
Admission: RE | Admit: 2013-11-18 | Discharge: 2013-11-18 | Disposition: A | Discharge status: Home / Self Care | Payer: Medicare Other | Source: Ambulatory Visit | Attending: Family Medicine | Admitting: Family Medicine

## 2013-11-18 DIAGNOSIS — M545 Low back pain, unspecified: Secondary | ICD-10-CM | POA: Insufficient documentation

## 2013-11-18 DIAGNOSIS — X58XXXA Exposure to other specified factors, initial encounter: Secondary | ICD-10-CM | POA: Insufficient documentation

## 2013-11-18 DIAGNOSIS — IMO0002 Reserved for concepts with insufficient information to code with codable children: Secondary | ICD-10-CM | POA: Insufficient documentation

## 2013-11-18 DIAGNOSIS — M5137 Other intervertebral disc degeneration, lumbosacral region: Secondary | ICD-10-CM | POA: Diagnosis not present

## 2013-11-18 DIAGNOSIS — M47817 Spondylosis without myelopathy or radiculopathy, lumbosacral region: Secondary | ICD-10-CM | POA: Diagnosis not present

## 2013-11-18 DIAGNOSIS — M5126 Other intervertebral disc displacement, lumbar region: Secondary | ICD-10-CM | POA: Diagnosis not present

## 2013-11-18 DIAGNOSIS — M48061 Spinal stenosis, lumbar region without neurogenic claudication: Secondary | ICD-10-CM | POA: Diagnosis not present

## 2013-12-08 ENCOUNTER — Encounter: Payer: Self-pay | Admitting: Cardiology

## 2013-12-08 ENCOUNTER — Ambulatory Visit (INDEPENDENT_AMBULATORY_CARE_PROVIDER_SITE_OTHER): Payer: Medicare Other | Admitting: Cardiology

## 2013-12-08 VITALS — BP 150/54 | HR 93 | Ht 74.0 in | Wt 216.0 lb

## 2013-12-08 DIAGNOSIS — I10 Essential (primary) hypertension: Secondary | ICD-10-CM | POA: Diagnosis not present

## 2013-12-08 DIAGNOSIS — R011 Cardiac murmur, unspecified: Secondary | ICD-10-CM

## 2013-12-08 NOTE — Assessment & Plan Note (Signed)
Keep followup with Dr. Hilma Favors.

## 2013-12-08 NOTE — Patient Instructions (Signed)
Continue all current medications. Follow up as needed  

## 2013-12-08 NOTE — Progress Notes (Signed)
Clinical Summary Ronald Colon is an 80 y.o.male last seen in January of this year. He was referred for an echocardiogram in light of long-standing heart murmur.  Echocardiogram from February of this year demonstrated moderate LVH with LVEF 60%, mildly sclerotic aortic valve with mild aortic regurgitation, mildly thickened mitral leaflets with trivial mitral regurgitation.  We reviewed the results again. He reports no major functional limitation related to shortness of breath, chest pain, or palpitations. He has had trouble with lower back pain and will be seeing Dr. Carloyn Manner soon.    No Known Allergies  Current Outpatient Prescriptions  Medication Sig Dispense Refill  . acetaminophen (TYLENOL) 500 MG tablet Take 500 mg by mouth every 6 (six) hours as needed.      Marland Kitchen allopurinol (ZYLOPRIM) 300 MG tablet Take 300 mg by mouth daily.      Marland Kitchen amLODipine-olmesartan (AZOR) 5-40 MG per tablet Take 1 tablet by mouth daily.        Marland Kitchen COLCRYS 0.6 MG tablet Take 1 tablet by mouth daily as needed.       Marland Kitchen dexlansoprazole (DEXILANT) 60 MG capsule Take 1 capsule (60 mg total) by mouth daily.  90 capsule  3  . diazepam (VALIUM) 5 MG tablet Take 5 mg by mouth every 12 (twelve) hours as needed.       . ezetimibe (ZETIA) 10 MG tablet Take 10 mg by mouth daily.      . fish oil-omega-3 fatty acids 1000 MG capsule Take 1 g by mouth daily.      Marland Kitchen ipratropium (ATROVENT) 0.03 % nasal spray Place 2 sprays into both nostrils 2 (two) times daily.       . traZODone (DESYREL) 50 MG tablet Take 50 mg by mouth at bedtime as needed.        No current facility-administered medications for this visit.    Past Medical History  Diagnosis Date  . Diverticulosis   . Colon polyp   . Arthritis   . Essential hypertension, benign   . Hyperlipidemia     Social History Ronald Colon reports that he has quit smoking. His smoking use included Cigarettes. He smoked 0.00 packs per day. He has never used smokeless tobacco. Ronald Colon  reports that he does not drink alcohol.  Review of Systems Stable appetite, no bleeding problems, no syncope. Otherwise as outlined.  Physical Examination Filed Vitals:   12/08/13 1325  BP: 150/54  Pulse: 93   Filed Weights   12/08/13 1325  Weight: 216 lb (97.977 kg)    The patient appears comfortable at rest.  HEENT: Conjunctiva and lids normal, oropharynx clear.  Neck: Supple, no elevated JVP or carotid bruits, no thyromegaly.  Lungs: Clear to auscultation, nonlabored breathing at rest.  Cardiac: Regular rate and rhythm, no S3, [id number] at base systolic murmur, no obvious diastolic murmur appreciated, no pericardial rub.  Abdomen: Soft, nontender, bowel sounds present, no guarding or rebound.  Extremities: No pitting edema, distal pulses 2+.    Problem List and Plan   Heart murmur Related to sclerotic aortic valve without stenosis, also has mild aortic regurgitation but no significant diastolic murmur. He is asymptomatic and there is no indication for further workup at this point. He does not require antibiotic prophylaxis for dental procedures. I recommended that he keep follow with Dr. Hilma Favors. We can certainly see him back down the road if the situation changes.  Essential hypertension, benign Keep followup with Dr. Hilma Favors.    Satira Sark,  M.D., F.A.C.C.

## 2013-12-08 NOTE — Assessment & Plan Note (Signed)
Related to sclerotic aortic valve without stenosis, also has mild aortic regurgitation but no significant diastolic murmur. He is asymptomatic and there is no indication for further workup at this point. He does not require antibiotic prophylaxis for dental procedures. I recommended that he keep follow with Dr. Hilma Favors. We can certainly see him back down the road if the situation changes.

## 2013-12-24 DIAGNOSIS — M5126 Other intervertebral disc displacement, lumbar region: Secondary | ICD-10-CM | POA: Diagnosis not present

## 2013-12-24 DIAGNOSIS — M48061 Spinal stenosis, lumbar region without neurogenic claudication: Secondary | ICD-10-CM | POA: Diagnosis not present

## 2013-12-24 DIAGNOSIS — M47817 Spondylosis without myelopathy or radiculopathy, lumbosacral region: Secondary | ICD-10-CM | POA: Diagnosis not present

## 2014-01-11 DIAGNOSIS — M47817 Spondylosis without myelopathy or radiculopathy, lumbosacral region: Secondary | ICD-10-CM | POA: Diagnosis not present

## 2014-01-11 DIAGNOSIS — M5137 Other intervertebral disc degeneration, lumbosacral region: Secondary | ICD-10-CM | POA: Diagnosis not present

## 2014-01-11 DIAGNOSIS — M48061 Spinal stenosis, lumbar region without neurogenic claudication: Secondary | ICD-10-CM | POA: Diagnosis not present

## 2014-01-15 DIAGNOSIS — E785 Hyperlipidemia, unspecified: Secondary | ICD-10-CM | POA: Diagnosis not present

## 2014-01-15 DIAGNOSIS — I10 Essential (primary) hypertension: Secondary | ICD-10-CM | POA: Diagnosis not present

## 2014-01-15 DIAGNOSIS — M109 Gout, unspecified: Secondary | ICD-10-CM | POA: Diagnosis not present

## 2014-01-15 DIAGNOSIS — M47817 Spondylosis without myelopathy or radiculopathy, lumbosacral region: Secondary | ICD-10-CM | POA: Diagnosis not present

## 2014-01-15 DIAGNOSIS — Z8711 Personal history of peptic ulcer disease: Secondary | ICD-10-CM | POA: Diagnosis not present

## 2014-01-15 DIAGNOSIS — Z79899 Other long term (current) drug therapy: Secondary | ICD-10-CM | POA: Diagnosis not present

## 2014-01-15 DIAGNOSIS — M5137 Other intervertebral disc degeneration, lumbosacral region: Secondary | ICD-10-CM | POA: Diagnosis not present

## 2014-01-15 DIAGNOSIS — F411 Generalized anxiety disorder: Secondary | ICD-10-CM | POA: Diagnosis not present

## 2014-02-10 DIAGNOSIS — M5137 Other intervertebral disc degeneration, lumbosacral region: Secondary | ICD-10-CM | POA: Diagnosis not present

## 2014-02-10 DIAGNOSIS — M47817 Spondylosis without myelopathy or radiculopathy, lumbosacral region: Secondary | ICD-10-CM | POA: Diagnosis not present

## 2014-02-10 DIAGNOSIS — M48061 Spinal stenosis, lumbar region without neurogenic claudication: Secondary | ICD-10-CM | POA: Diagnosis not present

## 2014-03-02 DIAGNOSIS — F411 Generalized anxiety disorder: Secondary | ICD-10-CM | POA: Diagnosis not present

## 2014-03-02 DIAGNOSIS — M109 Gout, unspecified: Secondary | ICD-10-CM | POA: Diagnosis not present

## 2014-03-02 DIAGNOSIS — M5137 Other intervertebral disc degeneration, lumbosacral region: Secondary | ICD-10-CM | POA: Diagnosis not present

## 2014-03-02 DIAGNOSIS — E785 Hyperlipidemia, unspecified: Secondary | ICD-10-CM | POA: Diagnosis not present

## 2014-03-02 DIAGNOSIS — M47817 Spondylosis without myelopathy or radiculopathy, lumbosacral region: Secondary | ICD-10-CM | POA: Diagnosis not present

## 2014-03-02 DIAGNOSIS — I10 Essential (primary) hypertension: Secondary | ICD-10-CM | POA: Diagnosis not present

## 2014-03-02 DIAGNOSIS — Z79899 Other long term (current) drug therapy: Secondary | ICD-10-CM | POA: Diagnosis not present

## 2014-03-02 DIAGNOSIS — Z8711 Personal history of peptic ulcer disease: Secondary | ICD-10-CM | POA: Diagnosis not present

## 2014-03-18 DIAGNOSIS — L820 Inflamed seborrheic keratosis: Secondary | ICD-10-CM | POA: Diagnosis not present

## 2014-03-18 DIAGNOSIS — L570 Actinic keratosis: Secondary | ICD-10-CM | POA: Diagnosis not present

## 2014-04-06 ENCOUNTER — Encounter: Payer: Self-pay | Admitting: Gastroenterology

## 2014-04-28 DIAGNOSIS — M48061 Spinal stenosis, lumbar region without neurogenic claudication: Secondary | ICD-10-CM | POA: Diagnosis not present

## 2014-04-28 DIAGNOSIS — M2559 Pain in other specified joint: Secondary | ICD-10-CM | POA: Diagnosis not present

## 2014-04-28 DIAGNOSIS — M47817 Spondylosis without myelopathy or radiculopathy, lumbosacral region: Secondary | ICD-10-CM | POA: Diagnosis not present

## 2014-04-28 DIAGNOSIS — M5137 Other intervertebral disc degeneration, lumbosacral region: Secondary | ICD-10-CM | POA: Diagnosis not present

## 2014-05-18 DIAGNOSIS — E785 Hyperlipidemia, unspecified: Secondary | ICD-10-CM | POA: Diagnosis not present

## 2014-05-18 DIAGNOSIS — I10 Essential (primary) hypertension: Secondary | ICD-10-CM | POA: Diagnosis not present

## 2014-05-18 DIAGNOSIS — Z79899 Other long term (current) drug therapy: Secondary | ICD-10-CM | POA: Diagnosis not present

## 2014-05-18 DIAGNOSIS — M2550 Pain in unspecified joint: Secondary | ICD-10-CM | POA: Diagnosis not present

## 2014-05-18 DIAGNOSIS — M47816 Spondylosis without myelopathy or radiculopathy, lumbar region: Secondary | ICD-10-CM | POA: Diagnosis not present

## 2014-05-18 DIAGNOSIS — M5136 Other intervertebral disc degeneration, lumbar region: Secondary | ICD-10-CM | POA: Diagnosis not present

## 2014-05-18 DIAGNOSIS — F419 Anxiety disorder, unspecified: Secondary | ICD-10-CM | POA: Diagnosis not present

## 2014-05-18 DIAGNOSIS — M47817 Spondylosis without myelopathy or radiculopathy, lumbosacral region: Secondary | ICD-10-CM | POA: Diagnosis not present

## 2014-05-18 DIAGNOSIS — M109 Gout, unspecified: Secondary | ICD-10-CM | POA: Diagnosis not present

## 2014-05-18 DIAGNOSIS — M4806 Spinal stenosis, lumbar region: Secondary | ICD-10-CM | POA: Diagnosis not present

## 2014-05-18 DIAGNOSIS — Z8711 Personal history of peptic ulcer disease: Secondary | ICD-10-CM | POA: Diagnosis not present

## 2014-06-04 DIAGNOSIS — M169 Osteoarthritis of hip, unspecified: Secondary | ICD-10-CM | POA: Diagnosis not present

## 2014-06-04 DIAGNOSIS — Z6828 Body mass index (BMI) 28.0-28.9, adult: Secondary | ICD-10-CM | POA: Diagnosis not present

## 2014-06-04 DIAGNOSIS — J069 Acute upper respiratory infection, unspecified: Secondary | ICD-10-CM | POA: Diagnosis not present

## 2014-06-04 DIAGNOSIS — Z23 Encounter for immunization: Secondary | ICD-10-CM | POA: Diagnosis not present

## 2014-06-10 DIAGNOSIS — M1040 Other secondary gout, unspecified site: Secondary | ICD-10-CM | POA: Diagnosis not present

## 2014-06-10 DIAGNOSIS — E119 Type 2 diabetes mellitus without complications: Secondary | ICD-10-CM | POA: Diagnosis not present

## 2014-06-23 DIAGNOSIS — H2513 Age-related nuclear cataract, bilateral: Secondary | ICD-10-CM | POA: Diagnosis not present

## 2014-06-23 DIAGNOSIS — H538 Other visual disturbances: Secondary | ICD-10-CM | POA: Diagnosis not present

## 2014-06-25 DIAGNOSIS — M4806 Spinal stenosis, lumbar region: Secondary | ICD-10-CM | POA: Diagnosis not present

## 2014-06-25 DIAGNOSIS — M5126 Other intervertebral disc displacement, lumbar region: Secondary | ICD-10-CM | POA: Diagnosis not present

## 2014-06-25 DIAGNOSIS — M47816 Spondylosis without myelopathy or radiculopathy, lumbar region: Secondary | ICD-10-CM | POA: Diagnosis not present

## 2014-07-13 DIAGNOSIS — B029 Zoster without complications: Secondary | ICD-10-CM | POA: Diagnosis not present

## 2014-07-13 DIAGNOSIS — Z6828 Body mass index (BMI) 28.0-28.9, adult: Secondary | ICD-10-CM | POA: Diagnosis not present

## 2014-07-13 DIAGNOSIS — E119 Type 2 diabetes mellitus without complications: Secondary | ICD-10-CM | POA: Diagnosis not present

## 2014-07-15 DIAGNOSIS — E119 Type 2 diabetes mellitus without complications: Secondary | ICD-10-CM | POA: Diagnosis not present

## 2014-07-23 DIAGNOSIS — B029 Zoster without complications: Secondary | ICD-10-CM | POA: Diagnosis not present

## 2014-07-23 DIAGNOSIS — E663 Overweight: Secondary | ICD-10-CM | POA: Diagnosis not present

## 2014-07-23 DIAGNOSIS — Z6827 Body mass index (BMI) 27.0-27.9, adult: Secondary | ICD-10-CM | POA: Diagnosis not present

## 2014-07-26 NOTE — Patient Instructions (Signed)
Your procedure is scheduled on: 08/02/2014  Report to [facility] at  55   AM.  Call this number if you have problems the morning of surgery: [phone]   Do not eat food or drink liquids :After Midnight.      Take these medicines the morning of surgery with A SIP OF WATER: allopurinol, amlodipine   Do not wear jewelry, make-up or nail polish.  Do not wear lotions, powders, or perfumes.   Do not shave 48 hours prior to surgery.  Do not bring valuables to the hospital.  Contacts, dentures or bridgework may not be worn into surgery.  Leave suitcase in the car. After surgery it may be brought to your room.  For patients admitted to the hospital, checkout time is 11:00 AM the day of discharge.   Patients discharged the day of surgery will not be allowed to drive home.  :     Please read over the following fact sheets that you were given: Coughing and Deep Breathing, Surgical Site Infection Prevention, Anesthesia Post-op Instructions and Care and Recovery After Surgery    Cataract A cataract is a clouding of the lens of the eye. When a lens becomes cloudy, vision is reduced based on the degree and nature of the clouding. Many cataracts reduce vision to some degree. Some cataracts make people more near-sighted as they develop. Other cataracts increase glare. Cataracts that are ignored and become worse can sometimes look white. The white color can be seen through the pupil. CAUSES   Aging. However, cataracts may occur at any age, even in newborns.   Certain drugs.   Trauma to the eye.   Certain diseases such as diabetes.   Specific eye diseases such as chronic inflammation inside the eye or a sudden attack of a rare form of glaucoma.   Inherited or acquired medical problems.  SYMPTOMS   Gradual, progressive drop in vision in the affected eye.   Severe, rapid visual loss. This most often happens when trauma is the cause.  DIAGNOSIS  To detect a cataract, an eye doctor examines the  lens. Cataracts are best diagnosed with an exam of the eyes with the pupils enlarged (dilated) by drops.  TREATMENT  For an early cataract, vision may improve by using different eyeglasses or stronger lighting. If that does not help your vision, surgery is the only effective treatment. A cataract needs to be surgically removed when vision loss interferes with your everyday activities, such as driving, reading, or watching TV. A cataract may also have to be removed if it prevents examination or treatment of another eye problem. Surgery removes the cloudy lens and usually replaces it with a substitute lens (intraocular lens, IOL).  At a time when both you and your doctor agree, the cataract will be surgically removed. If you have cataracts in both eyes, only one is usually removed at a time. This allows the operated eye to heal and be out of danger from any possible problems after surgery (such as infection or poor wound healing). In rare cases, a cataract may be doing damage to your eye. In these cases, your caregiver may advise surgical removal right away. The vast majority of people who have cataract surgery have better vision afterward. HOME CARE INSTRUCTIONS  If you are not planning surgery, you may be asked to do the following:  Use different eyeglasses.   Use stronger or brighter lighting.   Ask your eye doctor about reducing your medicine dose or changing  medicines if it is thought that a medicine caused your cataract. Changing medicines does not make the cataract go away on its own.   Become familiar with your surroundings. Poor vision can lead to injury. Avoid bumping into things on the affected side. You are at a higher risk for tripping or falling.   Exercise extreme care when driving or operating machinery.   Wear sunglasses if you are sensitive to bright light or experiencing problems with glare.  SEEK IMMEDIATE MEDICAL CARE IF:   You have a worsening or sudden vision loss.   You  notice redness, swelling, or increasing pain in the eye.   You have a fever.  Document Released: 07/30/2005 Document Revised: 07/19/2011 Document Reviewed: 03/23/2011 [facility] Patient Information 2012 [place].PATIENT INSTRUCTIONS POST-ANESTHESIA  IMMEDIATELY FOLLOWING SURGERY:  Do not drive or operate machinery for the first twenty four hours after surgery.  Do not make any important decisions for twenty four hours after surgery or while taking narcotic pain medications or sedatives.  If you develop intractable nausea and vomiting or a severe headache please notify your doctor immediately.  FOLLOW-UP:  Please make an appointment with your surgeon as instructed. You do not need to follow up with anesthesia unless specifically instructed to do so.  WOUND CARE INSTRUCTIONS (if applicable):  Keep a dry clean dressing on the anesthesia/puncture wound site if there is drainage.  Once the wound has quit draining you may leave it open to air.  Generally you should leave the bandage intact for twenty four hours unless there is drainage.  If the epidural site drains for more than 36-48 hours please call the anesthesia department.  QUESTIONS?:  Please feel free to call your physician or the hospital operator if you have any questions, and they will be happy to assist you.

## 2014-07-27 ENCOUNTER — Encounter (HOSPITAL_COMMUNITY): Payer: Self-pay

## 2014-07-27 ENCOUNTER — Encounter (HOSPITAL_COMMUNITY)
Admission: RE | Admit: 2014-07-27 | Discharge: 2014-07-27 | Disposition: A | Discharge status: Home / Self Care | Payer: Medicare Other | Source: Ambulatory Visit | Attending: Ophthalmology | Admitting: Ophthalmology

## 2014-07-27 DIAGNOSIS — I517 Cardiomegaly: Secondary | ICD-10-CM | POA: Diagnosis not present

## 2014-07-27 DIAGNOSIS — Z01818 Encounter for other preprocedural examination: Secondary | ICD-10-CM | POA: Diagnosis not present

## 2014-07-27 DIAGNOSIS — I444 Left anterior fascicular block: Secondary | ICD-10-CM | POA: Diagnosis not present

## 2014-07-27 DIAGNOSIS — H2511 Age-related nuclear cataract, right eye: Secondary | ICD-10-CM | POA: Diagnosis not present

## 2014-07-27 DIAGNOSIS — H538 Other visual disturbances: Secondary | ICD-10-CM | POA: Diagnosis not present

## 2014-07-27 LAB — BASIC METABOLIC PANEL
ANION GAP: 12 (ref 5–15)
BUN: 20 mg/dL (ref 6–23)
CALCIUM: 10 mg/dL (ref 8.4–10.5)
CHLORIDE: 101 meq/L (ref 96–112)
CO2: 26 mEq/L (ref 19–32)
Creatinine, Ser: 1.03 mg/dL (ref 0.50–1.35)
GFR calc Af Amer: 75 mL/min — ABNORMAL LOW (ref >90)
GFR calc non Af Amer: 65 mL/min — ABNORMAL LOW (ref >90)
Glucose, Bld: 96 mg/dL (ref 70–99)
Potassium: 4.9 mEq/L (ref 3.7–5.3)
Sodium: 139 mEq/L (ref 137–147)

## 2014-07-27 LAB — HEMOGLOBIN AND HEMATOCRIT, BLOOD
HCT: 37.9 % — ABNORMAL LOW (ref 39.0–52.0)
Hemoglobin: 12.8 g/dL — ABNORMAL LOW (ref 13.0–17.0)

## 2014-08-02 ENCOUNTER — Encounter (HOSPITAL_COMMUNITY)
Admission: RE | Disposition: A | Discharge status: Home / Self Care | Payer: Self-pay | Source: Ambulatory Visit | Attending: Ophthalmology

## 2014-08-02 ENCOUNTER — Ambulatory Visit (HOSPITAL_COMMUNITY)
Admission: RE | Admit: 2014-08-02 | Discharge: 2014-08-02 | Disposition: A | Discharge status: Home / Self Care | Payer: Medicare Other | Source: Ambulatory Visit | Attending: Ophthalmology | Admitting: Ophthalmology

## 2014-08-02 ENCOUNTER — Ambulatory Visit (HOSPITAL_COMMUNITY): Payer: Medicare Other | Admitting: Anesthesiology

## 2014-08-02 ENCOUNTER — Encounter (HOSPITAL_COMMUNITY): Payer: Self-pay | Admitting: *Deleted

## 2014-08-02 DIAGNOSIS — Z87891 Personal history of nicotine dependence: Secondary | ICD-10-CM | POA: Insufficient documentation

## 2014-08-02 DIAGNOSIS — H2511 Age-related nuclear cataract, right eye: Secondary | ICD-10-CM | POA: Diagnosis not present

## 2014-08-02 DIAGNOSIS — K219 Gastro-esophageal reflux disease without esophagitis: Secondary | ICD-10-CM | POA: Insufficient documentation

## 2014-08-02 DIAGNOSIS — I10 Essential (primary) hypertension: Secondary | ICD-10-CM | POA: Diagnosis not present

## 2014-08-02 DIAGNOSIS — H538 Other visual disturbances: Secondary | ICD-10-CM | POA: Diagnosis not present

## 2014-08-02 DIAGNOSIS — H268 Other specified cataract: Secondary | ICD-10-CM | POA: Insufficient documentation

## 2014-08-02 DIAGNOSIS — H259 Unspecified age-related cataract: Secondary | ICD-10-CM | POA: Diagnosis not present

## 2014-08-02 DIAGNOSIS — Z79899 Other long term (current) drug therapy: Secondary | ICD-10-CM | POA: Diagnosis not present

## 2014-08-02 HISTORY — PX: CATARACT EXTRACTION W/PHACO: SHX586

## 2014-08-02 SURGERY — PHACOEMULSIFICATION, CATARACT, WITH IOL INSERTION
Anesthesia: Monitor Anesthesia Care | Site: Eye | Laterality: Right

## 2014-08-02 MED ORDER — MIDAZOLAM HCL 2 MG/2ML IJ SOLN
INTRAMUSCULAR | Status: AC
  Filled 2014-08-02: qty 2

## 2014-08-02 MED ORDER — LACTATED RINGERS IV SOLN
INTRAVENOUS | Status: DC
  Administered 2014-08-02: 1000 mL via INTRAVENOUS

## 2014-08-02 MED ORDER — TETRACAINE HCL 0.5 % OP SOLN
1.0000 [drp] | OPHTHALMIC | Status: AC
  Administered 2014-08-02 (×3): 1 [drp] via OPHTHALMIC

## 2014-08-02 MED ORDER — BSS IO SOLN
INTRAOCULAR | Status: DC | PRN
  Administered 2014-08-02: 15 mL via INTRAOCULAR

## 2014-08-02 MED ORDER — POVIDONE-IODINE 5 % OP SOLN
OPHTHALMIC | Status: DC | PRN
  Administered 2014-08-02: 1 via OPHTHALMIC

## 2014-08-02 MED ORDER — TETRACAINE 0.5 % OP SOLN OPTIME - NO CHARGE
OPHTHALMIC | Status: DC | PRN
  Administered 2014-08-02: 1 [drp] via OPHTHALMIC

## 2014-08-02 MED ORDER — NA HYALUR & NA CHOND-NA HYALUR 0.55-0.5 ML IO KIT
PACK | INTRAOCULAR | Status: DC | PRN
  Administered 2014-08-02: 1 via OPHTHALMIC

## 2014-08-02 MED ORDER — LIDOCAINE HCL 3.5 % OP GEL: 1.0000 "application " | Freq: Once | OPHTHALMIC | Status: DC

## 2014-08-02 MED ORDER — FENTANYL CITRATE 0.05 MG/ML IJ SOLN
INTRAMUSCULAR | Status: AC
  Filled 2014-08-02: qty 2

## 2014-08-02 MED ORDER — LIDOCAINE HCL 3.5 % OP GEL
OPHTHALMIC | Status: DC | PRN
  Administered 2014-08-02: 1 via OPHTHALMIC

## 2014-08-02 MED ORDER — CYCLOPENTOLATE-PHENYLEPHRINE OP SOLN OPTIME - NO CHARGE
OPHTHALMIC | Status: AC
  Filled 2014-08-02: qty 2

## 2014-08-02 MED ORDER — MIDAZOLAM HCL 2 MG/2ML IJ SOLN
1.0000 mg | INTRAMUSCULAR | Status: DC | PRN
  Administered 2014-08-02: 2 mg via INTRAVENOUS

## 2014-08-02 MED ORDER — EPINEPHRINE HCL 1 MG/ML IJ SOLN
INTRAOCULAR | Status: DC | PRN
  Administered 2014-08-02: 500 mL

## 2014-08-02 MED ORDER — TETRACAINE HCL 0.5 % OP SOLN
OPHTHALMIC | Status: AC
  Filled 2014-08-02: qty 2

## 2014-08-02 MED ORDER — EPINEPHRINE HCL 1 MG/ML IJ SOLN
INTRAMUSCULAR | Status: AC
  Filled 2014-08-02: qty 1

## 2014-08-02 MED ORDER — FENTANYL CITRATE 0.05 MG/ML IJ SOLN
25.0000 ug | INTRAMUSCULAR | Status: AC
  Administered 2014-08-02 (×2): 25 ug via INTRAVENOUS

## 2014-08-02 MED ORDER — CYCLOPENTOLATE-PHENYLEPHRINE 0.2-1 % OP SOLN
1.0000 [drp] | OPHTHALMIC | Status: AC
  Administered 2014-08-02 (×3): 1 [drp] via OPHTHALMIC

## 2014-08-02 MED ORDER — LIDOCAINE HCL 3.5 % OP GEL
OPHTHALMIC | Status: AC
  Filled 2014-08-02: qty 1

## 2014-08-02 SURGICAL SUPPLY — 28 items
CAPSULAR TENSION RING-AMO (OPHTHALMIC RELATED) IMPLANT
CLOTH BEACON ORANGE TIMEOUT ST (SAFETY) ×3 IMPLANT
GLOVE BIO SURGEON STRL SZ7.5 (GLOVE) IMPLANT
GLOVE BIOGEL M 6.5 STRL (GLOVE) IMPLANT
GLOVE BIOGEL PI IND STRL 6.5 (GLOVE) ×1 IMPLANT
GLOVE BIOGEL PI IND STRL 7.0 (GLOVE) IMPLANT
GLOVE BIOGEL PI INDICATOR 6.5 (GLOVE) ×2
GLOVE BIOGEL PI INDICATOR 7.0 (GLOVE)
GLOVE ECLIPSE 6.5 STRL STRAW (GLOVE) IMPLANT
GLOVE ECLIPSE 7.5 STRL STRAW (GLOVE) IMPLANT
GLOVE EXAM NITRILE LRG STRL (GLOVE) IMPLANT
GLOVE EXAM NITRILE MD LF STRL (GLOVE) ×3 IMPLANT
GLOVE SKINSENSE NS SZ6.5 (GLOVE)
GLOVE SKINSENSE NS SZ7.0 (GLOVE)
GLOVE SKINSENSE STRL SZ6.5 (GLOVE) IMPLANT
GLOVE SKINSENSE STRL SZ7.0 (GLOVE) IMPLANT
INST SET CATARACT ~~LOC~~ (KITS) ×3 IMPLANT
KIT VITRECTOMY (OPHTHALMIC RELATED) IMPLANT
PAD ARMBOARD 7.5X6 YLW CONV (MISCELLANEOUS) ×3 IMPLANT
PROC W NO LENS (INTRAOCULAR LENS)
PROC W SPEC LENS (INTRAOCULAR LENS)
PROCESS W NO LENS (INTRAOCULAR LENS) IMPLANT
PROCESS W SPEC LENS (INTRAOCULAR LENS) IMPLANT
RETRACTOR IRIS SIGHTPATH (OPHTHALMIC RELATED) IMPLANT
RING MALYGIN (MISCELLANEOUS) IMPLANT
SIGHTPATH CAT PROC W REG LENS (Ophthalmic Related) ×3 IMPLANT
VISCOELASTIC ADDITIONAL (OPHTHALMIC RELATED) IMPLANT
WATER STERILE IRR 250ML POUR (IV SOLUTION) ×3 IMPLANT

## 2014-08-02 NOTE — Transfer of Care (Signed)
Immediate Anesthesia Transfer of Care Note  Patient: Ronald Colon  Procedure(s) Performed: Procedure(s): CATARACT EXTRACTION PHACO AND INTRAOCULAR LENS PLACEMENT; CDE:  12.77 (Right)  Patient Location: Short Stay  Anesthesia Type:MAC  Level of Consciousness: awake, alert , oriented and patient cooperative  Airway & Oxygen Therapy: Patient Spontanous Breathing  Post-op Assessment: Report given to PACU RN, Post -op Vital signs reviewed and stable and Patient moving all extremities  Post vital signs: Reviewed and stable  Complications: No apparent anesthesia complications

## 2014-08-02 NOTE — Anesthesia Preprocedure Evaluation (Signed)
Anesthesia Evaluation  Patient identified by MRN, date of birth, ID band Patient awake    Reviewed: Allergy & Precautions, H&P , NPO status , Patient's Chart, lab work & pertinent test results  Airway Mallampati: II  TM Distance: >3 FB     Dental  (+) Teeth Intact, Poor Dentition   Pulmonary former smoker,  breath sounds clear to auscultation        Cardiovascular hypertension, Pt. on medications + dysrhythmias Rhythm:Regular Rate:Normal     Neuro/Psych    GI/Hepatic GERD-  Controlled and Medicated,  Endo/Other    Renal/GU      Musculoskeletal   Abdominal   Peds  Hematology   Anesthesia Other Findings   Reproductive/Obstetrics                             Anesthesia Physical Anesthesia Plan  ASA: III  Anesthesia Plan: MAC   Post-op Pain Management:    Induction: Intravenous  Airway Management Planned: Nasal Cannula  Additional Equipment:   Intra-op Plan:   Post-operative Plan:   Informed Consent: I have reviewed the patients History and Physical, chart, labs and discussed the procedure including the risks, benefits and alternatives for the proposed anesthesia with the patient or authorized representative who has indicated his/her understanding and acceptance.     Plan Discussed with:   Anesthesia Plan Comments:         Anesthesia Quick Evaluation

## 2014-08-02 NOTE — H&P (Signed)
I have reviewed the pre printed H&P, the patient was re-examined, and I have identified no significant interval changes in the patient's medical condition.  There is no change in the plan of care since the history and physical of record. 

## 2014-08-02 NOTE — Discharge Instructions (Addendum)
MARITZA SLIKER 08/02/2014 Dr. Iona Hansen Post operative Instructions for Cataract Patients  These instructions are for Ronald Colon and pertain to the operative eye.  1.  Resume your normal diet and previous oral medicines.  2. Your Follow-up appointment is at Dr. Iona Hansen' office in [place] on 07/04/2014 at 10:45 am.  3. You may leave the hospital when your driver is present and your nurse releases you.  4. Begin Pred Forte (prednisolone acetate 1%), Acular LS (ketorolac tromethamine .4%) and Gatifloxacin 0.5% eye drops; 1 drop each 4 times daily to operative eye. Begin 3 hours after discharge from Short Stay Unit.  Moxifloxacin 0.5% may be substituted for Gatifloxacin using the same instructions.  56. Page Dr. Iona Hansen via beeper [phone] for significant pain in or around operative eye that is not relieved by Tylenol.  6. If you took Plavix before surgery, restart it at the usual dose on the evening of surgery.  7. Wear dark glasses as necessary for excessive light sensitivity.  8. Do no forcefully rub you your operative eye.  9. Keep your operative eye dry for 1 week. You may gently clean your eyelids with a damp washcloth.  10. You may resume normal occupational activities in one week and resume driving as tolerated after the first post operative visit.  11. It is normal to have blurred vision and a scratchy sensation following surgery.  Dr. Iona [id number]  PATIENT INSTRUCTIONS POST-ANESTHESIA  IMMEDIATELY FOLLOWING SURGERY:  Do not drive or operate machinery for the first twenty four hours after surgery.  Do not make any important decisions for twenty four hours after surgery or while taking narcotic pain medications or sedatives.  If you develop intractable nausea and vomiting or a severe headache please notify your doctor immediately.  FOLLOW-UP:  Please make an appointment with your surgeon as instructed. You do not need to follow up with anesthesia unless specifically instructed to  do so.  WOUND CARE INSTRUCTIONS (if applicable):  Keep a dry clean dressing on the anesthesia/puncture wound site if there is drainage.  Once the wound has quit draining you may leave it open to air.  Generally you should leave the bandage intact for twenty four hours unless there is drainage.  If the epidural site drains for more than 36-48 hours please call the anesthesia department.  QUESTIONS?:  Please feel free to call your physician or the hospital operator if you have any questions, and they will be happy to assist you.

## 2014-08-02 NOTE — Op Note (Signed)
See scanned op note 

## 2014-08-02 NOTE — Anesthesia Postprocedure Evaluation (Signed)
  Anesthesia Post-op Note  Patient: Ronald Colon  Procedure(s) Performed: Procedure(s): CATARACT EXTRACTION PHACO AND INTRAOCULAR LENS PLACEMENT; CDE:  12.77 (Right)  Patient Location: Short Stay  Anesthesia Type:MAC  Level of Consciousness: awake, alert , oriented and patient cooperative  Airway and Oxygen Therapy: Patient Spontanous Breathing  Post-op Pain: none  Post-op Assessment: Post-op Vital signs reviewed, Patient's Cardiovascular Status Stable, Respiratory Function Stable, Patent Airway, Adequate PO intake and Pain level controlled  Post-op Vital Signs: Reviewed and stable  Last Vitals:  Filed Vitals:   08/02/14 0735  BP: 107/61  Temp:   Resp: 12    Complications: No apparent anesthesia complications

## 2014-08-02 NOTE — Brief Op Note (Signed)
08/02/2014  8:27 AM  PATIENT:  Ronald Colon  80 y.o. male  PRE-OPERATIVE DIAGNOSIS:  nuclear cataract right eye  POST-OPERATIVE DIAGNOSIS:  nuclear cataract right eye  PROCEDURE:  Procedure(s): CATARACT EXTRACTION PHACO AND INTRAOCULAR LENS PLACEMENT; CDE:  12.77  SURGEON:  Surgeon(s): Williams Che, MD  ASSISTANTS:  Bonney Roussel, CST  ANESTHESIA STAFF: Anesthesiologist: Lerry Liner, MD CRNA: Charmaine Downs, CRNA  ANESTHESIA:   topical and MAC  REQUESTED LENS POWER: 19.0  LENS IMPLANT INFORMATION:  [employer] [id number] s/n [id number]  Exp 03/2018  CUMULATIVE DISSIPATED ENERGY:12.77  INDICATIONS:see office H&P  OP FINDINGS:dense NS  COMPLICATIONS:None  DICTATION #: none  PLAN OF CARE: as above  PATIENT DISPOSITION:  Short Stay

## 2014-08-03 ENCOUNTER — Encounter (HOSPITAL_COMMUNITY): Payer: Self-pay | Admitting: Ophthalmology

## 2014-08-16 DIAGNOSIS — B029 Zoster without complications: Secondary | ICD-10-CM | POA: Diagnosis not present

## 2014-08-16 DIAGNOSIS — Z6827 Body mass index (BMI) 27.0-27.9, adult: Secondary | ICD-10-CM | POA: Diagnosis not present

## 2014-09-09 DIAGNOSIS — L570 Actinic keratosis: Secondary | ICD-10-CM | POA: Diagnosis not present

## 2014-09-09 DIAGNOSIS — X32XXXD Exposure to sunlight, subsequent encounter: Secondary | ICD-10-CM | POA: Diagnosis not present

## 2014-09-09 DIAGNOSIS — D225 Melanocytic nevi of trunk: Secondary | ICD-10-CM | POA: Diagnosis not present

## 2014-09-14 DIAGNOSIS — M47816 Spondylosis without myelopathy or radiculopathy, lumbar region: Secondary | ICD-10-CM | POA: Diagnosis not present

## 2014-09-14 DIAGNOSIS — M5126 Other intervertebral disc displacement, lumbar region: Secondary | ICD-10-CM | POA: Diagnosis not present

## 2014-09-14 DIAGNOSIS — M4806 Spinal stenosis, lumbar region: Secondary | ICD-10-CM | POA: Diagnosis not present

## 2014-10-06 DIAGNOSIS — Z6827 Body mass index (BMI) 27.0-27.9, adult: Secondary | ICD-10-CM | POA: Diagnosis not present

## 2014-10-06 DIAGNOSIS — B0229 Other postherpetic nervous system involvement: Secondary | ICD-10-CM | POA: Diagnosis not present

## 2014-10-19 DIAGNOSIS — M25552 Pain in left hip: Secondary | ICD-10-CM | POA: Diagnosis not present

## 2014-10-19 DIAGNOSIS — Z6828 Body mass index (BMI) 28.0-28.9, adult: Secondary | ICD-10-CM | POA: Diagnosis not present

## 2014-11-11 DIAGNOSIS — L820 Inflamed seborrheic keratosis: Secondary | ICD-10-CM | POA: Diagnosis not present

## 2014-11-11 DIAGNOSIS — X32XXXD Exposure to sunlight, subsequent encounter: Secondary | ICD-10-CM | POA: Diagnosis not present

## 2014-11-11 DIAGNOSIS — L570 Actinic keratosis: Secondary | ICD-10-CM | POA: Diagnosis not present

## 2014-11-23 DIAGNOSIS — Z23 Encounter for immunization: Secondary | ICD-10-CM | POA: Diagnosis not present

## 2015-02-02 DIAGNOSIS — G629 Polyneuropathy, unspecified: Secondary | ICD-10-CM | POA: Diagnosis not present

## 2015-02-02 DIAGNOSIS — E663 Overweight: Secondary | ICD-10-CM | POA: Diagnosis not present

## 2015-02-02 DIAGNOSIS — Z6827 Body mass index (BMI) 27.0-27.9, adult: Secondary | ICD-10-CM | POA: Diagnosis not present

## 2015-02-07 NOTE — Patient Instructions (Signed)
Ronald Colon  02/07/2015     @PREFPERIOPPHARMACY @   Your procedure is scheduled on 02/15/2015  Report to [facility] [place].M.  Call this number if you have problems the morning of surgery:  [phone]   Remember:  Do not eat food or drink liquids after midnight.  Take these medicines the morning of surgery with A SIP OF WATER Allopurinol, Azor, Colcrys   Do not wear jewelry, make-up or nail polish.  Do not wear lotions, powders, or perfumes.  You may wear deodorant.  Do not shave 48 hours prior to surgery.  Men may shave face and neck.  Do not bring valuables to the hospital.  [facility] is not responsible for any belongings or valuables.  Contacts, dentures or bridgework may not be worn into surgery.  Leave your suitcase in the car.  After surgery it may be brought to your room.  For patients admitted to the hospital, discharge time will be determined by your treatment team.  Patients discharged the day of surgery will not be allowed to drive home.    Please read over the following fact sheets that you were given. Anesthesia Post-op Instructions      PATIENT INSTRUCTIONS POST-ANESTHESIA  IMMEDIATELY FOLLOWING SURGERY:  Do not drive or operate machinery for the first twenty four hours after surgery.  Do not make any important decisions for twenty four hours after surgery or while taking narcotic pain medications or sedatives.  If you develop intractable nausea and vomiting or a severe headache please notify your doctor immediately.  FOLLOW-UP:  Please make an appointment with your surgeon as instructed. You do not need to follow up with anesthesia unless specifically instructed to do so.  WOUND CARE INSTRUCTIONS (if applicable):  Keep a dry clean dressing on the anesthesia/puncture wound site if there is drainage.  Once the wound has quit draining you may leave it open to air.  Generally you should leave the bandage intact for twenty four hours unless there is  drainage.  If the epidural site drains for more than 36-48 hours please call the anesthesia department.  QUESTIONS?:  Please feel free to call your physician or the hospital operator if you have any questions, and they will be happy to assist you.      Cataract A cataract is a clouding of the lens of the eye. When a lens becomes cloudy, vision is reduced based on the degree and nature of the clouding. Many cataracts reduce vision to some degree. Some cataracts make people more near-sighted as they develop. Other cataracts increase glare. Cataracts that are ignored and become worse can sometimes look white. The white color can be seen through the pupil. CAUSES   Aging. However, cataracts may occur at any age, even in newborns.  Certain drugs.  Trauma to the eye.  Certain diseases such as diabetes.  Specific eye diseases such as chronic inflammation inside the eye or a sudden attack of a rare form of glaucoma.  Inherited or acquired medical problems. SYMPTOMS   Gradual, progressive drop in vision in the affected eye.  Severe, rapid visual loss. This most often happens when trauma is the cause. DIAGNOSIS  To detect a cataract, an eye doctor examines the lens. Cataracts are best diagnosed with an exam of the eyes with the pupils enlarged (dilated) by drops.  TREATMENT  For an early cataract, vision may improve by using different eyeglasses or stronger lighting. If that does not help your vision, surgery  is the only effective treatment. A cataract needs to be surgically removed when vision loss interferes with your everyday activities, such as driving, reading, or watching TV. A cataract may also have to be removed if it prevents examination or treatment of another eye problem. Surgery removes the cloudy lens and usually replaces it with a substitute lens (intraocular lens, IOL).  At a time when both you and your doctor agree, the cataract will be surgically removed. If you have cataracts in  both eyes, only one is usually removed at a time. This allows the operated eye to heal and be out of danger from any possible problems after surgery (such as infection or poor wound healing). In rare cases, a cataract may be doing damage to your eye. In these cases, your caregiver may advise surgical removal right away. The vast majority of people who have cataract surgery have better vision afterward. HOME CARE INSTRUCTIONS  If you are not planning surgery, you may be asked to do the following:  Use different eyeglasses.  Use stronger or brighter lighting.  Ask your eye doctor about reducing your medicine dose or changing medicines if it is thought that a medicine caused your cataract. Changing medicines does not make the cataract go away on its own.  Become familiar with your surroundings. Poor vision can lead to injury. Avoid bumping into things on the affected side. You are at a higher risk for tripping or falling.  Exercise extreme care when driving or operating machinery.  Wear sunglasses if you are sensitive to bright light or experiencing problems with glare. SEEK IMMEDIATE MEDICAL CARE IF:   You have a worsening or sudden vision loss.  You notice redness, swelling, or increasing pain in the eye.  You have a fever. Document Released: 07/30/2005 Document Revised: 10/22/2011 Document Reviewed: 03/23/2011 [facility] Patient Information 2015 [place], [place]. This information is not intended to replace advice given to you by your health care provider. Make sure you discuss any questions you have with your health care provider.

## 2015-02-08 ENCOUNTER — Encounter (HOSPITAL_COMMUNITY): Payer: Self-pay

## 2015-02-08 ENCOUNTER — Encounter (HOSPITAL_COMMUNITY)
Admission: RE | Admit: 2015-02-08 | Discharge: 2015-02-08 | Disposition: A | Discharge status: Home / Self Care | Payer: Medicare Other | Source: Ambulatory Visit | Attending: Ophthalmology | Admitting: Ophthalmology

## 2015-02-08 DIAGNOSIS — Z01818 Encounter for other preprocedural examination: Secondary | ICD-10-CM | POA: Insufficient documentation

## 2015-02-08 DIAGNOSIS — H2512 Age-related nuclear cataract, left eye: Secondary | ICD-10-CM | POA: Insufficient documentation

## 2015-02-08 LAB — BASIC METABOLIC PANEL
Anion gap: 9 (ref 5–15)
BUN: 19 mg/dL (ref 6–20)
CO2: 25 mmol/L (ref 22–32)
CREATININE: 1.07 mg/dL (ref 0.61–1.24)
Calcium: 9.6 mg/dL (ref 8.9–10.3)
Chloride: 104 mmol/L (ref 101–111)
GFR calc Af Amer: >60 mL/min (ref >60)
GFR calc non Af Amer: >60 mL/min (ref >60)
Glucose, Bld: 111 mg/dL — ABNORMAL HIGH (ref 65–99)
Potassium: 5.2 mmol/L — ABNORMAL HIGH (ref 3.5–5.1)
Sodium: 138 mmol/L (ref 135–145)

## 2015-02-08 LAB — CBC
HCT: 41.4 % (ref 39.0–52.0)
HEMOGLOBIN: 13.8 g/dL (ref 13.0–17.0)
MCH: 31.3 pg (ref 26.0–34.0)
MCHC: 33.3 g/dL (ref 30.0–36.0)
MCV: 93.9 fL (ref 78.0–100.0)
Platelets: 226 10*3/uL (ref 150–400)
RBC: 4.41 MIL/uL (ref 4.22–5.81)
RDW: 13.7 % (ref 11.5–15.5)
WBC: 7.8 10*3/uL (ref 4.0–10.5)

## 2015-02-08 NOTE — Progress Notes (Signed)
K+ 5.2 reported to Dr Patsey Berthold. No orders given.

## 2015-02-14 MED ORDER — CYCLOPENTOLATE-PHENYLEPHRINE OP SOLN OPTIME - NO CHARGE
OPHTHALMIC | Status: AC
  Filled 2015-02-14: qty 2

## 2015-02-14 MED ORDER — LIDOCAINE HCL 3.5 % OP GEL
OPHTHALMIC | Status: AC
  Filled 2015-02-14: qty 1

## 2015-02-14 MED ORDER — TETRACAINE HCL 0.5 % OP SOLN
OPHTHALMIC | Status: AC
  Filled 2015-02-14: qty 2

## 2015-02-15 ENCOUNTER — Ambulatory Visit (HOSPITAL_COMMUNITY): Payer: Medicare Other | Admitting: Anesthesiology

## 2015-02-15 ENCOUNTER — Encounter (HOSPITAL_COMMUNITY): Payer: Self-pay | Admitting: *Deleted

## 2015-02-15 ENCOUNTER — Encounter (HOSPITAL_COMMUNITY)
Admission: RE | Disposition: A | Discharge status: Home / Self Care | Payer: Self-pay | Source: Ambulatory Visit | Attending: Ophthalmology

## 2015-02-15 ENCOUNTER — Ambulatory Visit (HOSPITAL_COMMUNITY)
Admission: RE | Admit: 2015-02-15 | Discharge: 2015-02-15 | Disposition: A | Discharge status: Home / Self Care | Payer: Medicare Other | Source: Ambulatory Visit | Attending: Ophthalmology | Admitting: Ophthalmology

## 2015-02-15 DIAGNOSIS — H2512 Age-related nuclear cataract, left eye: Secondary | ICD-10-CM | POA: Insufficient documentation

## 2015-02-15 DIAGNOSIS — H259 Unspecified age-related cataract: Secondary | ICD-10-CM | POA: Diagnosis not present

## 2015-02-15 DIAGNOSIS — Z79899 Other long term (current) drug therapy: Secondary | ICD-10-CM | POA: Insufficient documentation

## 2015-02-15 DIAGNOSIS — I10 Essential (primary) hypertension: Secondary | ICD-10-CM | POA: Insufficient documentation

## 2015-02-15 DIAGNOSIS — Z87891 Personal history of nicotine dependence: Secondary | ICD-10-CM | POA: Diagnosis not present

## 2015-02-15 DIAGNOSIS — H538 Other visual disturbances: Secondary | ICD-10-CM | POA: Diagnosis not present

## 2015-02-15 DIAGNOSIS — K219 Gastro-esophageal reflux disease without esophagitis: Secondary | ICD-10-CM | POA: Diagnosis not present

## 2015-02-15 HISTORY — PX: CATARACT EXTRACTION W/PHACO: SHX586

## 2015-02-15 SURGERY — PHACOEMULSIFICATION, CATARACT, WITH IOL INSERTION
Anesthesia: Monitor Anesthesia Care | Laterality: Left

## 2015-02-15 MED ORDER — LACTATED RINGERS IV SOLN
INTRAVENOUS | Status: DC
  Administered 2015-02-15: 1000 mL via INTRAVENOUS

## 2015-02-15 MED ORDER — BSS IO SOLN
INTRAOCULAR | Status: AC
  Filled 2015-02-15: qty 4

## 2015-02-15 MED ORDER — BSS IO SOLN
INTRAOCULAR | Status: DC | PRN
  Administered 2015-02-15: 15 mL

## 2015-02-15 MED ORDER — ONDANSETRON HCL 4 MG/2ML IJ SOLN: 4.0000 mg | Freq: Once | INTRAMUSCULAR | Status: DC | PRN

## 2015-02-15 MED ORDER — POVIDONE-IODINE 5 % OP SOLN
OPHTHALMIC | Status: DC | PRN
  Administered 2015-02-15: 1 via OPHTHALMIC

## 2015-02-15 MED ORDER — FENTANYL CITRATE (PF) 100 MCG/2ML IJ SOLN: 25.0000 ug | INTRAMUSCULAR | Status: DC | PRN

## 2015-02-15 MED ORDER — FENTANYL CITRATE (PF) 100 MCG/2ML IJ SOLN
25.0000 ug | INTRAMUSCULAR | Status: AC
  Administered 2015-02-15 (×2): 25 ug via INTRAVENOUS

## 2015-02-15 MED ORDER — PHENYLEPHRINE-KETOROLAC 1-0.3 % IO SOLN
INTRAOCULAR | Status: DC | PRN
  Administered 2015-02-15: 500 mL via OPHTHALMIC

## 2015-02-15 MED ORDER — LIDOCAINE HCL 3.5 % OP GEL: 1.0000 "application " | Freq: Once | OPHTHALMIC | Status: DC

## 2015-02-15 MED ORDER — TETRACAINE 0.5 % OP SOLN OPTIME - NO CHARGE
OPHTHALMIC | Status: DC | PRN
  Administered 2015-02-15: 1 [drp] via OPHTHALMIC

## 2015-02-15 MED ORDER — LIDOCAINE HCL 3.5 % OP GEL
OPHTHALMIC | Status: DC | PRN
  Administered 2015-02-15: 1 via OPHTHALMIC

## 2015-02-15 MED ORDER — CYCLOPENTOLATE-PHENYLEPHRINE 0.2-1 % OP SOLN
1.0000 [drp] | OPHTHALMIC | Status: AC
  Administered 2015-02-15 (×3): 1 [drp] via OPHTHALMIC

## 2015-02-15 MED ORDER — FENTANYL CITRATE (PF) 100 MCG/2ML IJ SOLN
INTRAMUSCULAR | Status: AC
  Filled 2015-02-15: qty 2

## 2015-02-15 MED ORDER — MIDAZOLAM HCL 2 MG/2ML IJ SOLN
1.0000 mg | INTRAMUSCULAR | Status: DC | PRN
  Administered 2015-02-15: 2 mg via INTRAVENOUS

## 2015-02-15 MED ORDER — TETRACAINE HCL 0.5 % OP SOLN
1.0000 [drp] | OPHTHALMIC | Status: AC
  Administered 2015-02-15 (×3): 1 [drp] via OPHTHALMIC

## 2015-02-15 MED ORDER — NA HYALUR & NA CHOND-NA HYALUR 0.55-0.5 ML IO KIT
PACK | INTRAOCULAR | Status: DC | PRN
  Administered 2015-02-15: 1 via OPHTHALMIC

## 2015-02-15 MED ORDER — MIDAZOLAM HCL 2 MG/2ML IJ SOLN
INTRAMUSCULAR | Status: AC
  Filled 2015-02-15: qty 2

## 2015-02-15 SURGICAL SUPPLY — 28 items
CAPSULAR TENSION RING-AMO (OPHTHALMIC RELATED) IMPLANT
CLOTH BEACON ORANGE TIMEOUT ST (SAFETY) ×2 IMPLANT
GLOVE BIO SURGEON STRL SZ7.5 (GLOVE) IMPLANT
GLOVE BIOGEL M 6.5 STRL (GLOVE) IMPLANT
GLOVE BIOGEL PI IND STRL 6.5 (GLOVE) IMPLANT
GLOVE BIOGEL PI IND STRL 7.0 (GLOVE) ×2 IMPLANT
GLOVE BIOGEL PI INDICATOR 6.5 (GLOVE)
GLOVE BIOGEL PI INDICATOR 7.0 (GLOVE) ×2
GLOVE ECLIPSE 6.5 STRL STRAW (GLOVE) IMPLANT
GLOVE ECLIPSE 7.5 STRL STRAW (GLOVE) IMPLANT
GLOVE EXAM NITRILE LRG STRL (GLOVE) ×2 IMPLANT
GLOVE EXAM NITRILE MD LF STRL (GLOVE) IMPLANT
GLOVE SKINSENSE NS SZ6.5 (GLOVE)
GLOVE SKINSENSE NS SZ7.0 (GLOVE)
GLOVE SKINSENSE STRL SZ6.5 (GLOVE) IMPLANT
GLOVE SKINSENSE STRL SZ7.0 (GLOVE) IMPLANT
INST SET CATARACT ~~LOC~~ (KITS) ×2 IMPLANT
KIT VITRECTOMY (OPHTHALMIC RELATED) IMPLANT
LENS ALC ACRYL/TECN (Ophthalmic Related) ×2 IMPLANT
PAD ARMBOARD 7.5X6 YLW CONV (MISCELLANEOUS) ×2 IMPLANT
PROC W NO LENS (INTRAOCULAR LENS)
PROC W SPEC LENS (INTRAOCULAR LENS)
PROCESS W NO LENS (INTRAOCULAR LENS) IMPLANT
PROCESS W SPEC LENS (INTRAOCULAR LENS) IMPLANT
RETRACTOR IRIS SIGHTPATH (OPHTHALMIC RELATED) IMPLANT
RING MALYGIN (MISCELLANEOUS) IMPLANT
VISCOELASTIC ADDITIONAL (OPHTHALMIC RELATED) IMPLANT
WATER STERILE IRR 250ML POUR (IV SOLUTION) ×2 IMPLANT

## 2015-02-15 NOTE — Discharge Instructions (Addendum)
Cataract A cataract is a clouding of the lens of the eye. When a lens becomes cloudy, vision is reduced based on the degree and nature of the clouding. Many cataracts reduce vision to some degree. Some cataracts make people more near-sighted as they develop. Other cataracts increase glare. Cataracts that are ignored and become worse can sometimes look white. The white color can be seen through the pupil. CAUSES   Aging. However, cataracts may occur at any age, even in newborns.  Certain drugs.  Trauma to the eye.  Certain diseases such as diabetes.  Specific eye diseases such as chronic inflammation inside the eye or a sudden attack of a rare form of glaucoma.  Inherited or acquired medical problems. SYMPTOMS   Gradual, progressive drop in vision in the affected eye.  Severe, rapid visual loss. This most often happens when trauma is the cause. DIAGNOSIS  To detect a cataract, an eye doctor examines the lens. Cataracts are best diagnosed with an exam of the eyes with the pupils enlarged (dilated) by drops.  TREATMENT  For an early cataract, vision may improve by using different eyeglasses or stronger lighting. If that does not help your vision, surgery is the only effective treatment. A cataract needs to be surgically removed when vision loss interferes with your everyday activities, such as driving, reading, or watching TV. A cataract may also have to be removed if it prevents examination or treatment of another eye problem. Surgery removes the cloudy lens and usually replaces it with a substitute lens (intraocular lens, IOL).  At a time when both you and your doctor agree, the cataract will be surgically removed. If you have cataracts in both eyes, only one is usually removed at a time. This allows the operated eye to heal and be out of danger from any possible problems after surgery (such as infection or poor wound healing). In rare cases, a cataract may be doing damage to your eye. In  these cases, your caregiver may advise surgical removal right away. The vast majority of people who have cataract surgery have better vision afterward. HOME CARE INSTRUCTIONS  If you are not planning surgery, you may be asked to do the following:  Use different eyeglasses.  Use stronger or brighter lighting.  Ask your eye doctor about reducing your medicine dose or changing medicines if it is thought that a medicine caused your cataract. Changing medicines does not make the cataract go away on its own.  Become familiar with your surroundings. Poor vision can lead to injury. Avoid bumping into things on the affected side. You are at a higher risk for tripping or falling.  Exercise extreme care when driving or operating machinery.  Wear sunglasses if you are sensitive to bright light or experiencing problems with glare. SEEK IMMEDIATE MEDICAL CARE IF:   You have a worsening or sudden vision loss.  You notice redness, swelling, or increasing pain in the eye. You have a fever.  DrJames RONNALD BIDO 02/15/2015 Dr. Iona Hansen Post operative Instructions for Cataract Patients  These instructions are for Ronald Colon and pertain to the operative eye.  1.  Resume your normal diet and previous oral medicines.  2. Your Follow-up appointment is at Dr. Iona Hansen' office in [place] on 02/16/2015   at 10:15.  3. You may leave the hospital when your driver is present and your nurse releases you.  4. Begin Pred Forte (prednisolone acetate 1%), Acular LS (ketorolac tromethamine .4%) and Gatifloxacin 0.5% eye drops; 1 drop each  4 times daily to operative eye. Begin 3 hours after discharge from Short Stay Unit.  Moxifloxacin 0.5% may be substituted for Gatifloxacin using the same instructions.  88. Page Dr. Iona Hansen via beeper [phone] for significant pain in or around operative eye that is not relieved by Tylenol.  6. If you took Plavix before surgery, restart it at the usual dose on the evening of surgery.  7.  Wear dark glasses as necessary for excessive light sensitivity.  8. Do no forcefully rub you your operative eye.  9. Keep your operative eye dry for 1 week. You may gently clean your eyelids with a damp washcloth.  10. You may resume normal occupational activities in one week and resume driving as tolerated after the first post operative visit.  11. It is normal to have blurred vision and a scratchy sensation following surgery.  Dr. Iona [id number] Monitored Anesthesia Care Monitored anesthesia care is an anesthesia service for a medical procedure. Anesthesia is the loss of the ability to feel pain. It is produced by medicines called anesthetics. It may affect a small area of your body (local anesthesia), a large area of your body (regional anesthesia), or your entire body (general anesthesia). The need for monitored anesthesia care depends your procedure, your condition, and the potential need for regional or general anesthesia. It is often provided during procedures where:   General anesthesia may be needed if there are complications. This is because you need special care when you are under general anesthesia.   You will be under local or regional anesthesia. This is so that you are able to have higher levels of anesthesia if needed.   You will receive calming medicines (sedatives). This is especially the case if sedatives are given to put you in a semi-conscious state of relaxation (deep sedation). This is because the amount of sedative needed to produce this state can be hard to predict. Too much of a sedative can produce general anesthesia. Monitored anesthesia care is performed by one or more health care providers who have special training in all types of anesthesia. You will need to meet with these health care providers before your procedure. During this meeting, they will ask you about your medical history. They will also give you instructions to follow. (For example, you will need to  stop eating and drinking before your procedure. You may also need to stop or change medicines you are taking.) During your procedure, your health care providers will stay with you. They will:   Watch your condition. This includes watching your blood pressure, breathing, and level of pain.   Diagnose and treat problems that occur.   Give medicines if they are needed. These may include calming medicines (sedatives) and anesthetics.   Make sure you are comfortable.  Having monitored anesthesia care does not necessarily mean that you will be under anesthesia. It does mean that your health care providers will be able to manage anesthesia if you need it or if it occurs. It also means that you will be able to have a different type of anesthesia than you are having if you need it. When your procedure is complete, your health care providers will continue to watch your condition. They will make sure any medicines wear off before you are allowed to go home.  Document Released: 04/25/2005 Document Revised: 12/14/2013 Document Reviewed: 09/10/2012 [facility] Patient Information 2015 [place], [place]. This information is not intended to replace advice given to you by your health care provider. Make sure  you discuss any questions you have with your health care provider.

## 2015-02-15 NOTE — Transfer of Care (Signed)
Immediate Anesthesia Transfer of Care Note  Patient: Ronald Colon  Procedure(s) Performed: Procedure(s) with comments: CATARACT EXTRACTION PHACO AND INTRAOCULAR LENS PLACEMENT (IOC) (Left) - CDE 18.00  Patient Location: Short Stay  Anesthesia Type:MAC  Level of Consciousness: awake  Airway & Oxygen Therapy: Patient Spontanous Breathing  Post-op Assessment: Report given to RN  Post vital signs: Reviewed  Last Vitals:  Filed Vitals:   02/15/15 0730  BP: 130/74  Temp:   Resp: 14    Complications: No apparent anesthesia complications

## 2015-02-15 NOTE — Anesthesia Postprocedure Evaluation (Signed)
  Anesthesia Post-op Note  Patient: Ronald Colon  Procedure(s) Performed: Procedure(s) with comments: CATARACT EXTRACTION PHACO AND INTRAOCULAR LENS PLACEMENT (IOC) (Left) - CDE 18.00  Patient Location: Short Stay  Anesthesia Type:MAC  Level of Consciousness: awake, alert  and oriented  Airway and Oxygen Therapy: Patient Spontanous Breathing  Post-op Pain: none  Post-op Assessment: Post-op Vital signs reviewed, Patient's Cardiovascular Status Stable, Respiratory Function Stable, Patent Airway and No signs of Nausea or vomiting              Post-op Vital Signs: Reviewed and stable  Last Vitals:  Filed Vitals:   02/15/15 0730  BP: 130/74  Temp:   Resp: 14    Complications: No apparent anesthesia complications

## 2015-02-15 NOTE — Op Note (Signed)
02/15/2015  8:13 AM  PATIENT:  Ronald Colon  81 y.o. male  PRE-OPERATIVE DIAGNOSIS:  nuclear cataract left eye  POST-OPERATIVE DIAGNOSIS:  nuclear cataract left eye  PROCEDURE:  Procedure(s): CATARACT EXTRACTION PHACO AND INTRAOCULAR LENS PLACEMENT ([place])  SURGEON:  Surgeon(s): Williams Che, MD  ASSISTANTS:  Bonney Roussel, CST  ANESTHESIA STAFF: Anesthesiologist: Lerry Liner, MD CRNA: Ollen Bowl, CRNA  ANESTHESIA:   topical and MAC  REQUESTED LENS POWER: 20.5  LENS IMPLANT INFORMATION:   Alcon SN60WF   S/n SB:[id number]   Exp 03/2019  CUMULATIVE DISSIPATED ENERGY:18.00  INDICATIONS:see scanned office H&P  OP FINDINGS:dense NS  COMPLICATIONS:None  PROCEDURE:  The patient was brought to the operating room in good condition.  The operative eye was prepped and draped in the usual fashion for intraocular surgery.  Lidocaine gel was dropped onto the eye.  A 2.4 mm 10 O'clock near clear corneal stepped incision and a 12 O'clock stab incision were created.  Viscoat was instilled into the anterior chamber.  The 5 mm anterior capsulorhexis was performed with a bent needle cystotome and Utrata forceps.  The lens was hydrodissected and hydrodelineated with a cannula and balanced salt solution and rotated with a Kuglen hook.  Phacoemulsification was perfomed in the divide and conquer technique.  The remaining cortex was removed with I&A and the capsular surfaces polished as necessary.  Provisc was placed into the capsular bag and the lens inserted with the Alcon inserter.  The viscoelastic was removed with I&A and the lens "rocked" into position.  The wounds were hydrated and te anterior chamber was refilled with balanced salt solution.  The wounds were checked for leakage and rehydrated as necessary.  The lid speculum and drapes were removed and the patient was transported to short stay in good condition.  PATIENT DISPOSITION:  Short Stay

## 2015-02-15 NOTE — Brief Op Note (Signed)
02/15/2015  8:13 AM  PATIENT:  Ronald Colon  81 y.o. male  PRE-OPERATIVE DIAGNOSIS:  nuclear cataract left eye  POST-OPERATIVE DIAGNOSIS:  nuclear cataract left eye  PROCEDURE:  Procedure(s): CATARACT EXTRACTION PHACO AND INTRAOCULAR LENS PLACEMENT ([place])  SURGEON:  Surgeon(s): Williams Che, MD  ASSISTANTS:  Bonney Roussel, CST  ANESTHESIA STAFF: Anesthesiologist: Lerry Liner, MD CRNA: Ollen Bowl, CRNA  ANESTHESIA:   topical and MAC  REQUESTED LENS POWER: 20.5  LENS IMPLANT INFORMATION:   Alcon SN60WF   S/n SB:[id number]   Exp 03/2019  CUMULATIVE DISSIPATED ENERGY:18.00  INDICATIONS:see scanned office H&P  OP FINDINGS:dense NS  COMPLICATIONS:None  DICTATION #: none  PLAN OF CARE:  As above  PATIENT DISPOSITION:  Short Stay

## 2015-02-15 NOTE — H&P (Signed)
I have reviewed the pre printed H&P, the patient was re-examined, and I have identified no significant interval changes in the patient's medical condition.  There is no change in the plan of care since the history and physical of record. 

## 2015-02-15 NOTE — Anesthesia Preprocedure Evaluation (Signed)
Anesthesia Evaluation  Patient identified by MRN, date of birth, ID band Patient awake    Reviewed: Allergy & Precautions, H&P , NPO status , Patient's Chart, lab work & pertinent test results  Airway Mallampati: II  TM Distance: >3 FB     Dental  (+) Teeth Intact, Poor Dentition   Pulmonary former smoker,  breath sounds clear to auscultation        Cardiovascular hypertension, Pt. on medications + dysrhythmias Rhythm:Regular Rate:Normal     Neuro/Psych    GI/Hepatic GERD-  Controlled and Medicated,  Endo/Other    Renal/GU      Musculoskeletal   Abdominal   Peds  Hematology   Anesthesia Other Findings   Reproductive/Obstetrics                             Anesthesia Physical Anesthesia Plan  ASA: III  Anesthesia Plan: MAC   Post-op Pain Management:    Induction: Intravenous  Airway Management Planned: Nasal Cannula  Additional Equipment:   Intra-op Plan:   Post-operative Plan:   Informed Consent: I have reviewed the patients History and Physical, chart, labs and discussed the procedure including the risks, benefits and alternatives for the proposed anesthesia with the patient or authorized representative who has indicated his/her understanding and acceptance.     Plan Discussed with:   Anesthesia Plan Comments:         Anesthesia Quick Evaluation

## 2015-02-16 ENCOUNTER — Encounter (HOSPITAL_COMMUNITY): Payer: Self-pay | Admitting: Ophthalmology

## 2015-03-31 DIAGNOSIS — L820 Inflamed seborrheic keratosis: Secondary | ICD-10-CM | POA: Diagnosis not present

## 2015-06-09 DIAGNOSIS — Z1389 Encounter for screening for other disorder: Secondary | ICD-10-CM | POA: Diagnosis not present

## 2015-06-09 DIAGNOSIS — Z6827 Body mass index (BMI) 27.0-27.9, adult: Secondary | ICD-10-CM | POA: Diagnosis not present

## 2015-06-09 DIAGNOSIS — E663 Overweight: Secondary | ICD-10-CM | POA: Diagnosis not present

## 2015-06-09 DIAGNOSIS — Z23 Encounter for immunization: Secondary | ICD-10-CM | POA: Diagnosis not present

## 2015-06-09 DIAGNOSIS — M179 Osteoarthritis of knee, unspecified: Secondary | ICD-10-CM | POA: Diagnosis not present

## 2015-09-20 DIAGNOSIS — Z125 Encounter for screening for malignant neoplasm of prostate: Secondary | ICD-10-CM | POA: Diagnosis not present

## 2015-09-20 DIAGNOSIS — I10 Essential (primary) hypertension: Secondary | ICD-10-CM | POA: Diagnosis not present

## 2015-09-20 DIAGNOSIS — E782 Mixed hyperlipidemia: Secondary | ICD-10-CM | POA: Diagnosis not present

## 2015-09-21 DIAGNOSIS — G5603 Carpal tunnel syndrome, bilateral upper limbs: Secondary | ICD-10-CM | POA: Diagnosis not present

## 2015-09-21 DIAGNOSIS — M79642 Pain in left hand: Secondary | ICD-10-CM | POA: Diagnosis not present

## 2015-09-21 DIAGNOSIS — M79641 Pain in right hand: Secondary | ICD-10-CM | POA: Diagnosis not present

## 2015-10-04 DIAGNOSIS — E785 Hyperlipidemia, unspecified: Secondary | ICD-10-CM | POA: Diagnosis not present

## 2015-10-04 DIAGNOSIS — R208 Other disturbances of skin sensation: Secondary | ICD-10-CM | POA: Diagnosis not present

## 2015-10-04 DIAGNOSIS — M545 Low back pain: Secondary | ICD-10-CM | POA: Diagnosis not present

## 2015-10-04 DIAGNOSIS — I10 Essential (primary) hypertension: Secondary | ICD-10-CM | POA: Diagnosis not present

## 2015-10-04 DIAGNOSIS — G5603 Carpal tunnel syndrome, bilateral upper limbs: Secondary | ICD-10-CM | POA: Diagnosis not present

## 2015-10-13 DIAGNOSIS — E663 Overweight: Secondary | ICD-10-CM | POA: Diagnosis not present

## 2015-10-13 DIAGNOSIS — E782 Mixed hyperlipidemia: Secondary | ICD-10-CM | POA: Diagnosis not present

## 2015-10-13 DIAGNOSIS — Z1389 Encounter for screening for other disorder: Secondary | ICD-10-CM | POA: Diagnosis not present

## 2015-10-13 DIAGNOSIS — G5603 Carpal tunnel syndrome, bilateral upper limbs: Secondary | ICD-10-CM | POA: Diagnosis not present

## 2015-10-13 DIAGNOSIS — I10 Essential (primary) hypertension: Secondary | ICD-10-CM | POA: Diagnosis not present

## 2015-10-13 DIAGNOSIS — Z6828 Body mass index (BMI) 28.0-28.9, adult: Secondary | ICD-10-CM | POA: Diagnosis not present

## 2015-10-26 DIAGNOSIS — G5603 Carpal tunnel syndrome, bilateral upper limbs: Secondary | ICD-10-CM | POA: Diagnosis not present

## 2015-10-26 DIAGNOSIS — I10 Essential (primary) hypertension: Secondary | ICD-10-CM | POA: Diagnosis not present

## 2015-10-26 DIAGNOSIS — G5602 Carpal tunnel syndrome, left upper limb: Secondary | ICD-10-CM | POA: Diagnosis not present

## 2015-10-26 DIAGNOSIS — E785 Hyperlipidemia, unspecified: Secondary | ICD-10-CM | POA: Diagnosis not present

## 2015-10-26 DIAGNOSIS — G5601 Carpal tunnel syndrome, right upper limb: Secondary | ICD-10-CM | POA: Diagnosis not present

## 2015-10-26 DIAGNOSIS — M545 Low back pain: Secondary | ICD-10-CM | POA: Diagnosis not present

## 2015-10-26 DIAGNOSIS — M130 Polyarthritis, unspecified: Secondary | ICD-10-CM | POA: Diagnosis not present

## 2015-10-26 DIAGNOSIS — R208 Other disturbances of skin sensation: Secondary | ICD-10-CM | POA: Diagnosis not present

## 2015-11-02 DIAGNOSIS — G5603 Carpal tunnel syndrome, bilateral upper limbs: Secondary | ICD-10-CM | POA: Diagnosis not present

## 2015-11-02 DIAGNOSIS — M79641 Pain in right hand: Secondary | ICD-10-CM | POA: Diagnosis not present

## 2015-11-02 DIAGNOSIS — M79642 Pain in left hand: Secondary | ICD-10-CM | POA: Diagnosis not present

## 2015-11-18 DIAGNOSIS — E7800 Pure hypercholesterolemia, unspecified: Secondary | ICD-10-CM | POA: Diagnosis not present

## 2015-11-18 DIAGNOSIS — Z79899 Other long term (current) drug therapy: Secondary | ICD-10-CM | POA: Diagnosis not present

## 2015-11-18 DIAGNOSIS — Z886 Allergy status to analgesic agent status: Secondary | ICD-10-CM | POA: Diagnosis not present

## 2015-11-18 DIAGNOSIS — M1990 Unspecified osteoarthritis, unspecified site: Secondary | ICD-10-CM | POA: Diagnosis not present

## 2015-11-18 DIAGNOSIS — G5603 Carpal tunnel syndrome, bilateral upper limbs: Secondary | ICD-10-CM | POA: Diagnosis not present

## 2015-11-18 DIAGNOSIS — I10 Essential (primary) hypertension: Secondary | ICD-10-CM | POA: Diagnosis not present

## 2015-11-18 DIAGNOSIS — G5601 Carpal tunnel syndrome, right upper limb: Secondary | ICD-10-CM | POA: Diagnosis not present

## 2015-12-16 DIAGNOSIS — Z6828 Body mass index (BMI) 28.0-28.9, adult: Secondary | ICD-10-CM | POA: Diagnosis not present

## 2015-12-16 DIAGNOSIS — E119 Type 2 diabetes mellitus without complications: Secondary | ICD-10-CM | POA: Diagnosis not present

## 2015-12-16 DIAGNOSIS — Z1389 Encounter for screening for other disorder: Secondary | ICD-10-CM | POA: Diagnosis not present

## 2015-12-16 DIAGNOSIS — E663 Overweight: Secondary | ICD-10-CM | POA: Diagnosis not present

## 2016-03-07 DIAGNOSIS — M9903 Segmental and somatic dysfunction of lumbar region: Secondary | ICD-10-CM | POA: Diagnosis not present

## 2016-03-07 DIAGNOSIS — M4806 Spinal stenosis, lumbar region: Secondary | ICD-10-CM | POA: Diagnosis not present

## 2016-03-07 DIAGNOSIS — M47816 Spondylosis without myelopathy or radiculopathy, lumbar region: Secondary | ICD-10-CM | POA: Diagnosis not present

## 2016-03-09 DIAGNOSIS — M4806 Spinal stenosis, lumbar region: Secondary | ICD-10-CM | POA: Diagnosis not present

## 2016-03-09 DIAGNOSIS — M9903 Segmental and somatic dysfunction of lumbar region: Secondary | ICD-10-CM | POA: Diagnosis not present

## 2016-03-09 DIAGNOSIS — M47816 Spondylosis without myelopathy or radiculopathy, lumbar region: Secondary | ICD-10-CM | POA: Diagnosis not present

## 2016-03-12 DIAGNOSIS — M4806 Spinal stenosis, lumbar region: Secondary | ICD-10-CM | POA: Diagnosis not present

## 2016-03-12 DIAGNOSIS — M47816 Spondylosis without myelopathy or radiculopathy, lumbar region: Secondary | ICD-10-CM | POA: Diagnosis not present

## 2016-03-12 DIAGNOSIS — M9903 Segmental and somatic dysfunction of lumbar region: Secondary | ICD-10-CM | POA: Diagnosis not present

## 2016-03-15 DIAGNOSIS — M4806 Spinal stenosis, lumbar region: Secondary | ICD-10-CM | POA: Diagnosis not present

## 2016-03-15 DIAGNOSIS — M47816 Spondylosis without myelopathy or radiculopathy, lumbar region: Secondary | ICD-10-CM | POA: Diagnosis not present

## 2016-03-15 DIAGNOSIS — M9903 Segmental and somatic dysfunction of lumbar region: Secondary | ICD-10-CM | POA: Diagnosis not present

## 2016-04-04 DIAGNOSIS — Z1389 Encounter for screening for other disorder: Secondary | ICD-10-CM | POA: Diagnosis not present

## 2016-04-04 DIAGNOSIS — I10 Essential (primary) hypertension: Secondary | ICD-10-CM | POA: Diagnosis not present

## 2016-04-04 DIAGNOSIS — J0100 Acute maxillary sinusitis, unspecified: Secondary | ICD-10-CM | POA: Diagnosis not present

## 2016-04-04 DIAGNOSIS — E119 Type 2 diabetes mellitus without complications: Secondary | ICD-10-CM | POA: Diagnosis not present

## 2016-04-04 DIAGNOSIS — Z6827 Body mass index (BMI) 27.0-27.9, adult: Secondary | ICD-10-CM | POA: Diagnosis not present

## 2016-04-04 DIAGNOSIS — E663 Overweight: Secondary | ICD-10-CM | POA: Diagnosis not present

## 2016-04-04 DIAGNOSIS — E782 Mixed hyperlipidemia: Secondary | ICD-10-CM | POA: Diagnosis not present

## 2016-04-13 ENCOUNTER — Other Ambulatory Visit: Payer: Self-pay | Admitting: Family Medicine

## 2016-04-13 DIAGNOSIS — M545 Low back pain, unspecified: Secondary | ICD-10-CM

## 2016-04-13 DIAGNOSIS — G8929 Other chronic pain: Secondary | ICD-10-CM

## 2016-04-24 ENCOUNTER — Other Ambulatory Visit: Payer: Medicare Other

## 2016-04-25 ENCOUNTER — Ambulatory Visit
Admission: RE | Admit: 2016-04-25 | Discharge: 2016-04-25 | Disposition: A | Discharge status: Home / Self Care | Payer: Medicare Other | Source: Ambulatory Visit | Attending: Family Medicine | Admitting: Family Medicine

## 2016-04-25 DIAGNOSIS — M545 Low back pain, unspecified: Secondary | ICD-10-CM

## 2016-04-25 DIAGNOSIS — M47817 Spondylosis without myelopathy or radiculopathy, lumbosacral region: Secondary | ICD-10-CM | POA: Diagnosis not present

## 2016-04-25 DIAGNOSIS — G8929 Other chronic pain: Secondary | ICD-10-CM

## 2016-04-25 MED ORDER — IOPAMIDOL (ISOVUE-M 200) INJECTION 41%
1.0000 mL | Freq: Once | INTRAMUSCULAR | Status: AC
  Administered 2016-04-25: 1 mL via EPIDURAL

## 2016-04-25 MED ORDER — METHYLPREDNISOLONE ACETATE 40 MG/ML INJ SUSP (RADIOLOG
120.0000 mg | Freq: Once | INTRAMUSCULAR | Status: AC
  Administered 2016-04-25: 120 mg via EPIDURAL

## 2016-04-25 NOTE — Discharge Instructions (Signed)

## 2016-06-06 DIAGNOSIS — Z23 Encounter for immunization: Secondary | ICD-10-CM | POA: Diagnosis not present

## 2016-06-08 DIAGNOSIS — Z0000 Encounter for general adult medical examination without abnormal findings: Secondary | ICD-10-CM | POA: Diagnosis not present

## 2016-06-08 DIAGNOSIS — M48061 Spinal stenosis, lumbar region without neurogenic claudication: Secondary | ICD-10-CM | POA: Diagnosis not present

## 2016-06-08 DIAGNOSIS — M545 Low back pain: Secondary | ICD-10-CM | POA: Diagnosis not present

## 2016-06-08 DIAGNOSIS — E663 Overweight: Secondary | ICD-10-CM | POA: Diagnosis not present

## 2016-06-08 DIAGNOSIS — Z6827 Body mass index (BMI) 27.0-27.9, adult: Secondary | ICD-10-CM | POA: Diagnosis not present

## 2016-06-08 DIAGNOSIS — M5136 Other intervertebral disc degeneration, lumbar region: Secondary | ICD-10-CM | POA: Diagnosis not present

## 2016-06-15 ENCOUNTER — Other Ambulatory Visit: Payer: Self-pay | Admitting: Family Medicine

## 2016-06-15 DIAGNOSIS — E663 Overweight: Secondary | ICD-10-CM

## 2016-06-15 DIAGNOSIS — Z0000 Encounter for general adult medical examination without abnormal findings: Secondary | ICD-10-CM

## 2016-06-27 ENCOUNTER — Ambulatory Visit
Admission: RE | Admit: 2016-06-27 | Discharge: 2016-06-27 | Disposition: A | Discharge status: Home / Self Care | Payer: Medicare Other | Source: Ambulatory Visit | Attending: Family Medicine | Admitting: Family Medicine

## 2016-06-27 DIAGNOSIS — Z0000 Encounter for general adult medical examination without abnormal findings: Secondary | ICD-10-CM

## 2016-06-27 DIAGNOSIS — E663 Overweight: Secondary | ICD-10-CM

## 2016-06-27 DIAGNOSIS — M48061 Spinal stenosis, lumbar region without neurogenic claudication: Secondary | ICD-10-CM | POA: Diagnosis not present

## 2016-07-27 ENCOUNTER — Other Ambulatory Visit: Payer: Self-pay | Admitting: Family Medicine

## 2016-07-27 DIAGNOSIS — Z6827 Body mass index (BMI) 27.0-27.9, adult: Secondary | ICD-10-CM | POA: Diagnosis not present

## 2016-07-27 DIAGNOSIS — M48062 Spinal stenosis, lumbar region with neurogenic claudication: Secondary | ICD-10-CM | POA: Diagnosis not present

## 2016-07-27 DIAGNOSIS — M545 Low back pain: Secondary | ICD-10-CM | POA: Diagnosis not present

## 2016-07-27 DIAGNOSIS — M5416 Radiculopathy, lumbar region: Secondary | ICD-10-CM | POA: Diagnosis not present

## 2016-07-27 DIAGNOSIS — I10 Essential (primary) hypertension: Secondary | ICD-10-CM | POA: Diagnosis not present

## 2016-07-30 ENCOUNTER — Other Ambulatory Visit: Payer: Self-pay | Admitting: Family Medicine

## 2016-07-30 DIAGNOSIS — M545 Low back pain, unspecified: Secondary | ICD-10-CM

## 2016-07-30 DIAGNOSIS — G8929 Other chronic pain: Secondary | ICD-10-CM

## 2016-08-21 ENCOUNTER — Other Ambulatory Visit: Payer: Medicare Other

## 2016-08-27 ENCOUNTER — Ambulatory Visit
Admission: RE | Admit: 2016-08-27 | Discharge: 2016-08-27 | Disposition: A | Discharge status: Home / Self Care | Payer: Medicare Other | Source: Ambulatory Visit | Attending: Family Medicine | Admitting: Family Medicine

## 2016-08-27 ENCOUNTER — Other Ambulatory Visit: Payer: Medicare Other

## 2016-08-27 DIAGNOSIS — M5126 Other intervertebral disc displacement, lumbar region: Secondary | ICD-10-CM | POA: Diagnosis not present

## 2016-08-27 DIAGNOSIS — G8929 Other chronic pain: Secondary | ICD-10-CM

## 2016-08-27 DIAGNOSIS — M545 Low back pain: Principal | ICD-10-CM

## 2016-08-27 MED ORDER — METHYLPREDNISOLONE ACETATE 40 MG/ML INJ SUSP (RADIOLOG
120.0000 mg | Freq: Once | INTRAMUSCULAR | Status: AC
  Administered 2016-08-27: 120 mg via EPIDURAL

## 2016-08-27 MED ORDER — IOPAMIDOL (ISOVUE-M 200) INJECTION 41%
1.0000 mL | Freq: Once | INTRAMUSCULAR | Status: AC
  Administered 2016-08-27: 1 mL via EPIDURAL

## 2016-08-27 NOTE — Discharge Instructions (Signed)

## 2016-09-07 ENCOUNTER — Other Ambulatory Visit: Payer: Self-pay | Admitting: Family Medicine

## 2016-09-07 DIAGNOSIS — M545 Low back pain: Principal | ICD-10-CM

## 2016-09-07 DIAGNOSIS — G8929 Other chronic pain: Secondary | ICD-10-CM

## 2016-09-10 ENCOUNTER — Emergency Department (HOSPITAL_COMMUNITY): Payer: Medicare Other

## 2016-09-10 ENCOUNTER — Encounter (HOSPITAL_COMMUNITY): Payer: Self-pay | Admitting: Emergency Medicine

## 2016-09-10 ENCOUNTER — Inpatient Hospital Stay (HOSPITAL_COMMUNITY)
Admission: EM | Admit: 2016-09-10 | Discharge: 2016-09-11 | DRG: 065 | Disposition: A | Discharge status: Home / Self Care | Payer: Medicare Other | Attending: Internal Medicine | Admitting: Internal Medicine

## 2016-09-10 DIAGNOSIS — N189 Chronic kidney disease, unspecified: Secondary | ICD-10-CM

## 2016-09-10 DIAGNOSIS — E785 Hyperlipidemia, unspecified: Secondary | ICD-10-CM | POA: Diagnosis present

## 2016-09-10 DIAGNOSIS — Z9842 Cataract extraction status, left eye: Secondary | ICD-10-CM | POA: Diagnosis not present

## 2016-09-10 DIAGNOSIS — I63132 Cerebral infarction due to embolism of left carotid artery: Secondary | ICD-10-CM | POA: Diagnosis not present

## 2016-09-10 DIAGNOSIS — I129 Hypertensive chronic kidney disease with stage 1 through stage 4 chronic kidney disease, or unspecified chronic kidney disease: Secondary | ICD-10-CM | POA: Diagnosis present

## 2016-09-10 DIAGNOSIS — R011 Cardiac murmur, unspecified: Secondary | ICD-10-CM | POA: Diagnosis present

## 2016-09-10 DIAGNOSIS — I639 Cerebral infarction, unspecified: Secondary | ICD-10-CM

## 2016-09-10 DIAGNOSIS — I6340 Cerebral infarction due to embolism of unspecified cerebral artery: Principal | ICD-10-CM | POA: Diagnosis present

## 2016-09-10 DIAGNOSIS — K219 Gastro-esophageal reflux disease without esophagitis: Secondary | ICD-10-CM | POA: Diagnosis present

## 2016-09-10 DIAGNOSIS — Z86010 Personal history of colonic polyps: Secondary | ICD-10-CM

## 2016-09-10 DIAGNOSIS — R1310 Dysphagia, unspecified: Secondary | ICD-10-CM | POA: Diagnosis present

## 2016-09-10 DIAGNOSIS — Z961 Presence of intraocular lens: Secondary | ICD-10-CM | POA: Diagnosis present

## 2016-09-10 DIAGNOSIS — K5790 Diverticulosis of intestine, part unspecified, without perforation or abscess without bleeding: Secondary | ICD-10-CM | POA: Diagnosis present

## 2016-09-10 DIAGNOSIS — R29701 NIHSS score 1: Secondary | ICD-10-CM | POA: Diagnosis not present

## 2016-09-10 DIAGNOSIS — Z87891 Personal history of nicotine dependence: Secondary | ICD-10-CM

## 2016-09-10 DIAGNOSIS — M109 Gout, unspecified: Secondary | ICD-10-CM | POA: Diagnosis present

## 2016-09-10 DIAGNOSIS — Z79899 Other long term (current) drug therapy: Secondary | ICD-10-CM | POA: Diagnosis not present

## 2016-09-10 DIAGNOSIS — R29710 NIHSS score 10: Secondary | ICD-10-CM | POA: Diagnosis present

## 2016-09-10 DIAGNOSIS — R29810 Facial weakness: Secondary | ICD-10-CM | POA: Diagnosis not present

## 2016-09-10 DIAGNOSIS — R130 Aphagia: Secondary | ICD-10-CM | POA: Diagnosis present

## 2016-09-10 DIAGNOSIS — Z66 Do not resuscitate: Secondary | ICD-10-CM | POA: Diagnosis present

## 2016-09-10 DIAGNOSIS — Z9841 Cataract extraction status, right eye: Secondary | ICD-10-CM

## 2016-09-10 DIAGNOSIS — G459 Transient cerebral ischemic attack, unspecified: Secondary | ICD-10-CM

## 2016-09-10 DIAGNOSIS — I10 Essential (primary) hypertension: Secondary | ICD-10-CM | POA: Diagnosis not present

## 2016-09-10 DIAGNOSIS — I63531 Cerebral infarction due to unspecified occlusion or stenosis of right posterior cerebral artery: Secondary | ICD-10-CM | POA: Diagnosis not present

## 2016-09-10 DIAGNOSIS — G8191 Hemiplegia, unspecified affecting right dominant side: Secondary | ICD-10-CM | POA: Diagnosis present

## 2016-09-10 DIAGNOSIS — N183 Chronic kidney disease, stage 3 unspecified: Secondary | ICD-10-CM | POA: Diagnosis present

## 2016-09-10 DIAGNOSIS — I6523 Occlusion and stenosis of bilateral carotid arteries: Secondary | ICD-10-CM | POA: Diagnosis not present

## 2016-09-10 DIAGNOSIS — R739 Hyperglycemia, unspecified: Secondary | ICD-10-CM | POA: Diagnosis present

## 2016-09-10 DIAGNOSIS — Z823 Family history of stroke: Secondary | ICD-10-CM | POA: Diagnosis not present

## 2016-09-10 DIAGNOSIS — I63512 Cerebral infarction due to unspecified occlusion or stenosis of left middle cerebral artery: Secondary | ICD-10-CM | POA: Diagnosis present

## 2016-09-10 HISTORY — DX: Gout, unspecified: M10.9

## 2016-09-10 HISTORY — DX: Cerebral infarction, unspecified: I63.9

## 2016-09-10 LAB — URINALYSIS, ROUTINE W REFLEX MICROSCOPIC
Bilirubin Urine: NEGATIVE
Glucose, UA: NEGATIVE mg/dL
Hgb urine dipstick: NEGATIVE
KETONES UR: 5 mg/dL — AB
Leukocytes, UA: NEGATIVE
NITRITE: NEGATIVE
PH: 5 (ref 5.0–8.0)
Protein, ur: NEGATIVE mg/dL
Specific Gravity, Urine: 1.015 (ref 1.005–1.030)

## 2016-09-10 LAB — BASIC METABOLIC PANEL
Anion gap: 10 (ref 5–15)
BUN: 28 mg/dL — AB (ref 6–20)
CO2: 20 mmol/L — ABNORMAL LOW (ref 22–32)
CREATININE: 1.47 mg/dL — AB (ref 0.61–1.24)
Calcium: 9.6 mg/dL (ref 8.9–10.3)
Chloride: 103 mmol/L (ref 101–111)
GFR calc Af Amer: 48 mL/min — ABNORMAL LOW (ref >60)
GFR, EST NON AFRICAN AMERICAN: 42 mL/min — AB (ref >60)
GLUCOSE: 130 mg/dL — AB (ref 65–99)
Potassium: 4.7 mmol/L (ref 3.5–5.1)
SODIUM: 133 mmol/L — AB (ref 135–145)

## 2016-09-10 LAB — RAPID URINE DRUG SCREEN, HOSP PERFORMED
AMPHETAMINES: NOT DETECTED
BENZODIAZEPINES: NOT DETECTED
Barbiturates: NOT DETECTED
COCAINE: NOT DETECTED
OPIATES: NOT DETECTED
Tetrahydrocannabinol: NOT DETECTED

## 2016-09-10 LAB — CBC
HCT: 45 % (ref 39.0–52.0)
Hemoglobin: 14.9 g/dL (ref 13.0–17.0)
MCH: 31.6 pg (ref 26.0–34.0)
MCHC: 33.1 g/dL (ref 30.0–36.0)
MCV: 95.5 fL (ref 78.0–100.0)
PLATELETS: 236 10*3/uL (ref 150–400)
RBC: 4.71 MIL/uL (ref 4.22–5.81)
RDW: 13.6 % (ref 11.5–15.5)
WBC: 14.1 10*3/uL — AB (ref 4.0–10.5)

## 2016-09-10 LAB — TROPONIN I: TROPONIN I: 0.06 ng/mL — AB (ref <0.03)

## 2016-09-10 LAB — CBG MONITORING, ED: Glucose-Capillary: 120 mg/dL — ABNORMAL HIGH (ref 65–99)

## 2016-09-10 LAB — TSH: TSH: 2.827 u[IU]/mL (ref 0.350–4.500)

## 2016-09-10 MED ORDER — ACETAMINOPHEN 160 MG/5ML PO SOLN: 650.0000 mg | ORAL | Status: DC | PRN

## 2016-09-10 MED ORDER — ACETAMINOPHEN 650 MG RE SUPP: 650.0000 mg | RECTAL | Status: DC | PRN

## 2016-09-10 MED ORDER — ENOXAPARIN SODIUM 40 MG/0.4ML ~~LOC~~ SOLN
40.0000 mg | SUBCUTANEOUS | Status: DC
  Administered 2016-09-10: 40 mg via SUBCUTANEOUS
  Filled 2016-09-10: qty 0.4

## 2016-09-10 MED ORDER — ASPIRIN 325 MG PO TABS
325.0000 mg | ORAL_TABLET | Freq: Every day | ORAL | Status: DC
  Administered 2016-09-10 – 2016-09-11 (×2): 325 mg via ORAL
  Filled 2016-09-10 (×2): qty 1

## 2016-09-10 MED ORDER — SODIUM CHLORIDE 0.9 % IV SOLN
INTRAVENOUS | Status: DC
  Administered 2016-09-10: 22:00:00 via INTRAVENOUS

## 2016-09-10 MED ORDER — EZETIMIBE 10 MG PO TABS
10.0000 mg | ORAL_TABLET | Freq: Every day | ORAL | Status: DC
  Administered 2016-09-11: 10 mg via ORAL
  Filled 2016-09-10: qty 1

## 2016-09-10 MED ORDER — ACETAMINOPHEN 325 MG PO TABS: 650.0000 mg | ORAL_TABLET | ORAL | Status: DC | PRN

## 2016-09-10 MED ORDER — ALLOPURINOL 300 MG PO TABS
300.0000 mg | ORAL_TABLET | Freq: Every day | ORAL | Status: DC
  Administered 2016-09-11: 300 mg via ORAL
  Filled 2016-09-10: qty 1

## 2016-09-10 MED ORDER — SODIUM CHLORIDE 0.9 % IV SOLN
Freq: Once | INTRAVENOUS | Status: AC
  Administered 2016-09-10: 17:00:00 via INTRAVENOUS

## 2016-09-10 MED ORDER — SENNOSIDES-DOCUSATE SODIUM 8.6-50 MG PO TABS: 1.0000 | ORAL_TABLET | Freq: Every evening | ORAL | Status: DC | PRN

## 2016-09-10 MED ORDER — ASPIRIN 300 MG RE SUPP: 300.0000 mg | Freq: Every day | RECTAL | Status: DC

## 2016-09-10 MED ORDER — STROKE: EARLY STAGES OF RECOVERY BOOK
Freq: Once | Status: DC
  Filled 2016-09-10: qty 1

## 2016-09-10 NOTE — ED Notes (Signed)
Hospitalist at bedside 

## 2016-09-10 NOTE — ED Notes (Signed)
Patient transported to CT 

## 2016-09-10 NOTE — ED Notes (Signed)
Returned from CT.

## 2016-09-10 NOTE — ED Provider Notes (Signed)
[facility] DEPT Provider Note   CSN: [id number] Arrival date & time: 09/10/16  1527     History   Chief Complaint Chief Complaint  Patient presents with  . Extremity Weakness    HPI Ronald Colon is a 83 y.o. male.  Pt presents to the ED today with his son.  The pt has had a right facial droop, right arm weakness, and difficulty speaking for the past 2-3 days.  The speech has gotten worse, so he finally came to the ED.  The pt was able to eat and swallow breakfast this morning without problems.  Pt did have ASA this morning.      Past Medical History:  Diagnosis Date  . Arthritis   . Colon polyp   . Diverticulosis   . Essential hypertension, benign   . Heart murmur   . Hyperlipidemia     Patient Active Problem List   Diagnosis Date Noted  . CVA (cerebral vascular accident) ([place]) 09/10/2016  . GERD (gastroesophageal reflux disease) 08/24/2013  . Heart murmur 08/18/2013  . Irregular heartbeat 08/18/2013  . Essential hypertension, benign 08/18/2013    Past Surgical History:  Procedure Laterality Date  . CATARACT EXTRACTION W/PHACO Right 08/02/2014   Procedure: CATARACT EXTRACTION PHACO AND INTRAOCULAR LENS PLACEMENT; CDE:  12.77;  Surgeon: Williams Che, MD;  Location: [facility];  Service: Ophthalmology;  Laterality: Right;  . CATARACT EXTRACTION W/PHACO Left 02/15/2015   Procedure: CATARACT EXTRACTION PHACO AND INTRAOCULAR LENS PLACEMENT (IOC);  Surgeon: Williams Che, MD;  Location: [facility];  Service: Ophthalmology;  Laterality: Left;  CDE 18.00  . COLONOSCOPY    . ELBOW SURGERY    . ESOPHAGEAL DILATION  09/03/2013   Procedure: ESOPHAGEAL DILATION;  Surgeon: Rogene Houston, MD;  Location: [facility];  Service: Endoscopy;;  . ESOPHAGOGASTRODUODENOSCOPY N/A 09/03/2013   Procedure: ESOPHAGOGASTRODUODENOSCOPY (EGD);  Surgeon: Rogene Houston, MD;  Location: [facility];  Service: Endoscopy;  Laterality: N/A;  255-rescheduled to 1030 Ann to notify pt        Home Medications    Prior to Admission medications   Medication Sig Start Date End Date Taking? Authorizing Provider  allopurinol (ZYLOPRIM) 300 MG tablet Take 300 mg by mouth daily.   Yes Historical Provider, MD  amLODipine-olmesartan (AZOR) 5-40 MG per tablet Take 1 tablet by mouth daily.     Yes Historical Provider, MD  Aspirin-Salicylamide-Caffeine (BC HEADACHE) 325-95-16 MG TABS Take 1 packet by mouth daily as needed (for pain).   Yes Historical Provider, MD  ezetimibe (ZETIA) 10 MG tablet Take 10 mg by mouth daily.   Yes Historical Provider, MD  fish oil-omega-3 fatty acids 1000 MG capsule Take 1 g by mouth daily.   Yes Historical Provider, MD    Family History Family History  Problem Relation Age of Onset  . Colon cancer Neg Hx     Social History Social History  Substance Use Topics  . Smoking status: Former Smoker    Packs/day: 1.00    Years: 20.00    Types: Cigarettes  . Smokeless tobacco: Never Used  . Alcohol use No     Allergies   Patient has no known allergies.   Review of Systems Review of Systems  Neurological: Positive for facial asymmetry, speech difficulty, weakness and numbness.  All other systems reviewed and are negative.    Physical Exam Updated Vital Signs BP 157/81   Pulse 90   Temp 98.6 F (37 C)   Resp 12  Ht 6\' 1"  (1.854 m)   Wt 215 lb (97.5 kg)   SpO2 99%   BMI 28.37 kg/m   Physical Exam  Constitutional: He appears well-developed and well-nourished.  HENT:  Head: Normocephalic and atraumatic.  Right Ear: External ear normal.  Left Ear: External ear normal.  Nose: Nose normal.  Mouth/Throat: Oropharynx is clear and moist.  Eyes: Conjunctivae and EOM are normal. Pupils are equal, round, and reactive to light.  Neck: Normal range of motion. Neck supple.  Cardiovascular: Regular rhythm, normal heart sounds and intact distal pulses.  Tachycardia present.   Pulmonary/Chest: Effort normal and breath sounds normal.   Abdominal: Soft. Bowel sounds are normal.  Musculoskeletal: Normal range of motion.  Neurological: He is alert.  Pt can understand questions, but is aphasic.  It is frustrating to him that he can't speak.  His right arm is weak, but he is able to move it.  Right facial droop.    Skin: Skin is warm.  Psychiatric: He has a normal mood and affect. His behavior is normal. Judgment and thought content normal.  Nursing note and vitals reviewed.    ED Treatments / Results  Labs (all labs ordered are listed, but only abnormal results are displayed) Labs Reviewed  BASIC METABOLIC PANEL - Abnormal; Notable for the following:       Result Value   Sodium 133 (*)    CO2 20 (*)    Glucose, Bld 130 (*)    BUN 28 (*)    Creatinine, Ser 1.47 (*)    GFR calc non Af Amer 42 (*)    GFR calc Af Amer 48 (*)    All other components within normal limits  CBC - Abnormal; Notable for the following:    WBC 14.1 (*)    All other components within normal limits  CBG MONITORING, ED - Abnormal; Notable for the following:    Glucose-Capillary 120 (*)    All other components within normal limits  URINALYSIS, ROUTINE W REFLEX MICROSCOPIC    EKG  EKG Interpretation  Date/Time:  Monday September 10 2016 16:12:08 EST Ventricular Rate:  103 PR Interval:    QRS Duration: 124 QT Interval:  365 QTC Calculation: 478 R Axis:   -71 Text Interpretation:  Sinus tachycardia Nonspecific IVCD with LAD Left ventricular hypertrophy ST elevation, consider inferior injury Baseline wander in lead(s) V6 Confirmed by [facility] MD, Karlin Heilman ([id number]) on 09/10/2016 4:16:10 PM       Radiology Ct Head Wo Contrast  Result Date: 09/10/2016 CLINICAL DATA:  RIGHT-sided facial droop, aphasia, RIGHT upper extremity weakness and numbness for 2-3 days, history essential benign hypertension, hyperlipidemia EXAM: CT HEAD WITHOUT CONTRAST TECHNIQUE: Contiguous axial images were obtained from the base of the skull through the vertex without  intravenous contrast. COMPARISON:  10/21/2003 FINDINGS: Brain: Generalized atrophy. Normal ventricular morphology. No midline shift or mass effect. Small vessel chronic ischemic changes of deep cerebral white matter. No intracranial hemorrhage, mass lesion, or evidence acute infarction. No extra-axial fluid collections. Vascular: Atherosclerotic calcifications of the carotid siphons and vertebral arteries. Skull: Intact Sinuses/Orbits: Clear Other: N/A IMPRESSION: Atrophy with small vessel chronic ischemic changes of deep cerebral white matter. No acute intracranial abnormalities. Electronically Signed   By: Lavonia Dana M.D.   On: 09/10/2016 17:54    Procedures Procedures (including critical care time)  Medications Ordered in ED Medications  0.9 %  sodium chloride infusion ( Intravenous New Bag/Given 09/10/16 1642)     Initial Impression / Assessment  and Plan / ED Course  I have reviewed the triage vital signs and the nursing notes.  Pertinent labs & imaging results that were available during my care of the patient were reviewed by me and considered in my medical decision making (see chart for details).  Pt's speech has improved, but he still have some delay.  Pt d/w Dr. Lorin Mercy ([facility]) for admission.      Final Clinical Impressions(s) / ED Diagnoses   Final diagnoses:  Cerebrovascular accident (CVA), unspecified mechanism ([place])  Chronic renal impairment, unspecified CKD stage    New Prescriptions New Prescriptions   No medications on file     Isla Pence, MD 09/10/16 [phone]

## 2016-09-10 NOTE — H&P (Addendum)
History and Physical    Ronald Colon OFB:[id number] DOB: [birth year] DOA: 09/10/2016  PCP: Purvis Kilts, MD Consultants:  None Patient coming from: home - lives with wife; NOK: wife, [phone]  Chief Complaint: Possible stroke  HPI: Ronald Colon is a 83 y.o. male with medical history significant of HTN, HLD, and gout.  He reports "I thought I had a stroke."  R upper extremity numbness and weakness starting about 3 days ago.  Intermittent numbness.  No facial symptoms by his report, but his son reported right facial droop.  Expressive apahasia yesterday, worse today but now resolved by his report.  No LE symptoms.  No headache.  Ambualtes without diffucilty.  Some balance disturbance.  No dysphagia.   ED Course: Per Dr. Gilford Raid: Pt's speech has improved, but he still have some delay.  Pt d/w Dr. Lorin Mercy ([facility]) for admission.    Review of Systems: As per HPI; otherwise 10 point review of systems reviewed and negative.   Ambulatory Status:  Ambulates without assistance  Past Medical History:  Diagnosis Date  . Arthritis   . Colon polyp   . Diverticulosis   . Essential hypertension, benign   . Gout   . Heart murmur   . Hyperlipidemia     Past Surgical History:  Procedure Laterality Date  . CATARACT EXTRACTION W/PHACO Right 08/02/2014   Procedure: CATARACT EXTRACTION PHACO AND INTRAOCULAR LENS PLACEMENT; CDE:  12.77;  Surgeon: Williams Che, MD;  Location: [facility];  Service: Ophthalmology;  Laterality: Right;  . CATARACT EXTRACTION W/PHACO Left 02/15/2015   Procedure: CATARACT EXTRACTION PHACO AND INTRAOCULAR LENS PLACEMENT (IOC);  Surgeon: Williams Che, MD;  Location: [facility];  Service: Ophthalmology;  Laterality: Left;  CDE 18.00  . COLONOSCOPY    . ELBOW SURGERY    . ESOPHAGEAL DILATION  09/03/2013   Procedure: ESOPHAGEAL DILATION;  Surgeon: Rogene Houston, MD;  [facility];  Service: Endoscopy;;  . ESOPHAGOGASTRODUODENOSCOPY N/A 09/03/2013   Procedure: ESOPHAGOGASTRODUODENOSCOPY (EGD);  Surgeon: Rogene Houston, MD;  [facility];  Service: Endoscopy;  Laterality: N/A;  255-rescheduled to 1030 Ann to notify pt    Social History   Social History  . Marital status: Married    Spouse name: N/A  . Number of children: 1  . Years of education: N/A   Occupational History  . Retired    Social History Main Topics  . Smoking status: Former Smoker    Packs/day: 1.00    Years: 20.00    Types: Cigarettes    Quit date: 19  . Smokeless tobacco: Never Used  . Alcohol use No  . Drug use: No  . Sexual activity: Not on file   Other Topics Concern  . Not on file   Social History Narrative  . No narrative on file    No Known Allergies  Family History  Problem Relation Age of Onset  . CVA Mother 36  . Pneumonia Father 6  . Colon cancer Neg Hx     Prior to Admission medications   Medication Sig Start Date End Date Taking? Authorizing Provider  allopurinol (ZYLOPRIM) 300 MG tablet Take 300 mg by mouth daily.   Yes Historical Provider, MD  amLODipine-olmesartan (AZOR) 5-40 MG per tablet Take 1 tablet by mouth daily.     Yes Historical Provider, MD  Aspirin-Salicylamide-Caffeine (BC HEADACHE) 325-95-16 MG TABS Take 1 packet by mouth daily as needed (for pain).   Yes Historical Provider, MD  ezetimibe (ZETIA) 10 MG tablet Take 10 mg by mouth daily.   Yes Historical Provider, MD  fish oil-omega-3 fatty acids 1000 MG capsule Take 1 g by mouth daily.   Yes Historical Provider, MD    Physical Exam: Vitals:   09/10/16 1800 09/10/16 1832 09/10/16 1918 09/10/16 2017  BP: 157/81  171/96 123/75  Pulse:   75 96  Resp: 12  17 22   Temp:  98.6 F (37 C)    TempSrc:      SpO2:   99% 99%  Weight:      Height:         General:  Appears calm and comfortable and is NAD Eyes:  PERRL, EOMI, normal lids, iris ENT:  grossly normal hearing, lips & tongue, mmm Neck:  no LAD, masses or thyromegaly Cardiovascular:  RRR, no  m/r/g. No LE edema.  Respiratory:  CTA bilaterally, no w/r/r. Normal respiratory effort. Abdomen:  soft, ntnd, NABS Skin:  no rash or induration seen on limited exam Musculoskeletal:  grossly normal tone BUE/BLE, good ROM, no bony abnormality Psychiatric:  grossly normal mood and affect, speech generally appropriate but still with apparent episodic aphasia, AOx3 Neurologic:  CN 2-12 grossly intact, moves all extremities in coordinated fashion, sensation intact  Labs on Admission: I have personally reviewed following labs and imaging studies  CBC:  Recent Labs Lab 09/10/16 1626  WBC 14.1*  HGB 14.9  HCT 45.0  MCV 95.5  PLT 673   Basic Metabolic Panel:  Recent Labs Lab 09/10/16 1626  NA 133*  K 4.7  CL 103  CO2 20*  GLUCOSE 130*  BUN 28*  CREATININE 1.47*  CALCIUM 9.6   GFR: Estimated Creatinine Clearance: 45.2 mL/min (by C-G formula based on SCr of 1.47 mg/dL (H)). Liver Function Tests: No results for input(s): AST, ALT, ALKPHOS, BILITOT, PROT, ALBUMIN in the last 168 hours. No results for input(s): LIPASE, AMYLASE in the last 168 hours. No results for input(s): AMMONIA in the last 168 hours. Coagulation Profile: No results for input(s): INR, PROTIME in the last 168 hours. Cardiac Enzymes: No results for input(s): CKTOTAL, CKMB, CKMBINDEX, TROPONINI in the last 168 hours. BNP (last 3 results) No results for input(s): PROBNP in the last 8760 hours. HbA1C: No results for input(s): HGBA1C in the last 72 hours. CBG:  Recent Labs Lab 09/10/16 1645  GLUCAP 120*   Lipid Profile: No results for input(s): CHOL, HDL, LDLCALC, TRIG, CHOLHDL, LDLDIRECT in the last 72 hours. Thyroid Function Tests: No results for input(s): TSH, T4TOTAL, FREET4, T3FREE, THYROIDAB in the last 72 hours. Anemia Panel: No results for input(s): VITAMINB12, FOLATE, FERRITIN, TIBC, IRON, RETICCTPCT in the last 72 hours. Urine analysis:    Component Value Date/Time   COLORURINE YELLOW  09/10/2016 [place] 09/10/2016 1605   LABSPEC 1.015 09/10/2016 1605   PHURINE 5.0 09/10/2016 1605   GLUCOSEU NEGATIVE 09/10/2016 1605   HGBUR NEGATIVE 09/10/2016 [place] 09/10/2016 1605   KETONESUR 5 (A) 09/10/2016 1605   PROTEINUR NEGATIVE 09/10/2016 1605   NITRITE NEGATIVE 09/10/2016 1605   LEUKOCYTESUR NEGATIVE 09/10/2016 1605    Creatinine Clearance: Estimated Creatinine Clearance: 45.2 mL/min (by C-G formula based on SCr of 1.47 mg/dL (H)).  Sepsis Labs: @LABRCNTIP (procalcitonin:4,lacticidven:4) )No results found for this or any previous visit (from the past 240 hour(s)).   Radiological Exams on Admission: Ct Head Wo Contrast  Result Date: 09/10/2016 CLINICAL DATA:  RIGHT-sided facial droop, aphasia, RIGHT upper extremity weakness and  numbness for 2-3 days, history essential benign hypertension, hyperlipidemia EXAM: CT HEAD WITHOUT CONTRAST TECHNIQUE: Contiguous axial images were obtained from the base of the skull through the vertex without intravenous contrast. COMPARISON:  10/21/2003 FINDINGS: Brain: Generalized atrophy. Normal ventricular morphology. No midline shift or mass effect. Small vessel chronic ischemic changes of deep cerebral white matter. No intracranial hemorrhage, mass lesion, or evidence acute infarction. No extra-axial fluid collections. Vascular: Atherosclerotic calcifications of the carotid siphons and vertebral arteries. Skull: Intact Sinuses/Orbits: Clear Other: N/A IMPRESSION: Atrophy with small vessel chronic ischemic changes of deep cerebral white matter. No acute intracranial abnormalities. Electronically Signed   By: Lavonia Dana M.D.   On: 09/10/2016 17:54    EKG: Independently reviewed.  Sinus tachycardia with rate 103; IVCD and LVH with nonspecific ST changes with no evidence of acute ischemia  Assessment/Plan Principal Problem:   CVA (cerebral vascular accident) ([place]) Active Problems:   Essential hypertension,  benign   Hyperglycemia   CKD (chronic kidney disease), stage III   CVA -Symptoms - particularly ongoing aphasia - concerning for TIA/CVA -This far out, he would be expected to have CT changes if this was a CVA - but there is still a high likelihood -Will admit for CVA/TIA evaluation -Telemetry monitoring -MRI/MRA -Carotid dopplers -Echo -Risk stratification with FLP, A1c; will also check TSH and UDS -ASA daily -PT/OT/ST/Nutrition Consults -WBC 14.1, likely reactive -Troponin minimally elevated at 0.06, no chest pain, will trend.  HTN -Allow permissive HTN -Treat BP only if >220/120, and then with goal of 15% reduction -Hold CCB/ARB combo and plan to restart in 48-72 hours  HLD -Check FLP -Will plan to start statin therapy if FLP is not at goal (currently on Zetia monotherapy with fish oil)  Hypergylcemia -Glucose 130 -Will check A1c  CKD -Creatinine 1.47, prior 1.07 (6/16) -With chronic HTN, this is likely progression of disease - although there could be an acute kidney injury component, as well   DVT prophylaxis: Lovenox Code Status: DNR - confirmed with patient Family Communication: None present Disposition Plan:  Home once clinically improved Consults called: PT/ST/OT/Nutrition  Admission status: Admit - It is my clinical opinion that admission to [facility] is reasonable and necessary because this patient will require at least 2 midnights in the hospital to treat this condition based on the medical complexity of the problems presented.  Given the aforementioned information, the predictability of an adverse outcome is felt to be significant.    Karmen Bongo MD [facility]  If 7PM-7AM, please contact night-coverage [email or web address] Password [facility]  09/10/2016, 8:29 PM

## 2016-09-10 NOTE — ED Triage Notes (Signed)
PT c/o right sided facial droop, aphasia, right upper extremity weakness and numbness x2-3 days.

## 2016-09-10 NOTE — ED Notes (Signed)
Attempted to give report to nurse on floor- nurse busy, reports she will call back.

## 2016-09-10 NOTE — ED Notes (Signed)
Per Dr Gilford Raid, pt may have dinner tray.

## 2016-09-11 ENCOUNTER — Inpatient Hospital Stay (HOSPITAL_COMMUNITY): Payer: Medicare Other

## 2016-09-11 DIAGNOSIS — I63132 Cerebral infarction due to embolism of left carotid artery: Secondary | ICD-10-CM

## 2016-09-11 LAB — LIPID PANEL
CHOL/HDL RATIO: 4.8 ratio
Cholesterol: 178 mg/dL (ref 0–200)
HDL: 37 mg/dL — ABNORMAL LOW (ref >40)
LDL CALC: 113 mg/dL — AB (ref 0–99)
Triglycerides: 139 mg/dL (ref <150)
VLDL: 28 mg/dL (ref 0–40)

## 2016-09-11 LAB — TROPONIN I
Troponin I: 0.06 ng/mL (ref <0.03)
Troponin I: 0.06 ng/mL (ref <0.03)

## 2016-09-11 MED ORDER — ASPIRIN EC 81 MG PO TBEC: 81.0000 mg | DELAYED_RELEASE_TABLET | Freq: Every day | ORAL | Status: DC

## 2016-09-11 NOTE — Progress Notes (Signed)
Pt discharged via wheelchair into the care of his son via private vehicle.  Risks regarding not transferring to [facility] for further intervention for occluded Left ICA.  Pt verbalized understanding of risks and requests to go home.  Pt states that he will go to the follow up appt. PIV removed intact w/o S&S of complications.  Discharge instructions reviewed with pt/son. Pt/son verbalized understanding.

## 2016-09-11 NOTE — Discharge Summary (Signed)
Physician Discharge Summary  Ronald Colon PPI:[id number] DOB: Mar 14, 1929 DOA: 09/10/2016  PCP: Purvis Kilts, MD  Admit date: 09/10/2016 Discharge date: 09/11/2016  Time spent: 45 minutes  Recommendations for Outpatient Follow-up:  -To be discharged home today. -Patient has been urged to make appointment with Dr. Scot Dock with vascular surgery at his earliest convenience.   Discharge Diagnoses:  Principal Problem:   CVA (cerebral vascular accident) [facility]) Active Problems:   Essential hypertension, benign   Hyperglycemia   CKD (chronic kidney disease), stage III   Discharge Condition: Stable and improved  Filed Weights   09/10/16 1602  Weight: 97.5 kg (215 lb)    History of present illness:  As per Dr. Lorin Mercy on 1/29: Ronald Colon is a 83 y.o. male with medical history significant of HTN, HLD, and gout.  He reports "I thought I had a stroke."  R upper extremity numbness and weakness starting about 3 days ago.  Intermittent numbness.  No facial symptoms by his report, but his son reported right facial droop.  Expressive apahasia yesterday, worse today but now resolved by his report.  No LE symptoms.  No headache.  Ambualtes without diffucilty.  Some balance disturbance.  No dysphagia.   Hospital Course:   Embolic left cerebral hemispheric CVAs -MRI confirms patchy acute infarcts throughout the left cerebral hemisphere which may reflect emboli or hypoperfusion. -Patient has been started on aspirin for secondary stroke prevention. -Has been seen by physical and occupational therapy and they have not recommended any follow-up. -Carotid Dopplers have shown a nearly occluded left ICA. This has been discussed with Dr. Scot Dock with vascular surgery in [place], he has recommended that the patient be transferred to [facility] to be assessed by him for potential surgery. I have discussed this with the patient and his son at bedside. Patient refuses transfer, states he wants to  go home to be with his wife. I have explained to him that he is at high risk for having continued stroke that could have major disability. He still requests to go home despite my and his son's counseling. I have provided him with Dr. Nicole Cella phone number and have urged him to make an outpatient appointment at his earliest convenience. -He refuses to stay for his echo evaluation.   Procedures:  As above   Consultations:  Via phone with vascular surgery, Dr. Scot Dock  Discharge Instructions  Discharge Instructions    Ambulatory referral to Physical Therapy    Complete by:  As directed    Diet - low sodium heart healthy    Complete by:  As directed    Increase activity slowly    Complete by:  As directed      Allergies as of 09/11/2016   No Known Allergies     Medication List    TAKE these medications   allopurinol 300 MG tablet Commonly known as:  ZYLOPRIM Take 300 mg by mouth daily.   aspirin EC 81 MG tablet Take 1 tablet (81 mg total) by mouth daily.   AZOR 5-40 MG tablet Generic drug:  amLODipine-olmesartan Take 1 tablet by mouth daily.   BC HEADACHE 325-95-16 MG Tabs Generic drug:  Aspirin-Salicylamide-Caffeine Take 1 packet by mouth daily as needed (for pain).   ezetimibe 10 MG tablet Commonly known as:  ZETIA Take 10 mg by mouth daily.   fish oil-omega-3 fatty acids 1000 MG capsule Take 1 g by mouth daily.      No Known Allergies Follow-up Information  Schedule an appointment as soon as possible for a visit with Deitra Mayo, MD.   Specialties:  Vascular Surgery, Cardiology Why:  call for appt Contact information: [address] [place] [address] [phone]            The results of significant diagnostics from this hospitalization (including imaging, microbiology, ancillary and laboratory) are listed below for reference.    Significant Diagnostic Studies: Ct Head Wo Contrast  Result Date: 09/10/2016 CLINICAL DATA:   RIGHT-sided facial droop, aphasia, RIGHT upper extremity weakness and numbness for 2-3 days, history essential benign hypertension, hyperlipidemia EXAM: CT HEAD WITHOUT CONTRAST TECHNIQUE: Contiguous axial images were obtained from the base of the skull through the vertex without intravenous contrast. COMPARISON:  10/21/2003 FINDINGS: Brain: Generalized atrophy. Normal ventricular morphology. No midline shift or mass effect. Small vessel chronic ischemic changes of deep cerebral white matter. No intracranial hemorrhage, mass lesion, or evidence acute infarction. No extra-axial fluid collections. Vascular: Atherosclerotic calcifications of the carotid siphons and vertebral arteries. Skull: Intact Sinuses/Orbits: Clear Other: N/A IMPRESSION: Atrophy with small vessel chronic ischemic changes of deep cerebral white matter. No acute intracranial abnormalities. Electronically Signed   By: Lavonia Dana M.D.   On: 09/10/2016 17:54   Mr Brain Wo Contrast  Result Date: 09/11/2016 CLINICAL DATA:  Right upper extremity numbness and weakness. Aphasia. EXAM: MRI HEAD WITHOUT CONTRAST MRA HEAD WITHOUT CONTRAST TECHNIQUE: Multiplanar, multiecho pulse sequences of the brain and surrounding structures were obtained without intravenous contrast. Angiographic images of the head were obtained using MRA technique without contrast. COMPARISON:  Head CT 09/10/2016 FINDINGS: MRI HEAD FINDINGS Brain: There are small foci of patchy acute cortical and white matter infarction throughout the left cerebral hemisphere. The parietal lobe is involved to the greatest extent, however there is also involvement of frontal lobe cortex at the vertex, centrum semiovale, posterior and lateral temporal lobe, and lateral occipital lobe. There is also a punctate acute infarct in the posterior aspect of the external capsule. There is no evidence of intracranial hemorrhage, mass, midline shift, or extra-axial fluid collection. Mild generalized cerebral  atrophy is within normal limits for age. Patchy T2 hyperintensities in the cerebral white matter bilaterally are nonspecific but compatible with mild-to-moderate chronic small vessel ischemic disease. There is a chronic lacunar infarct in the body of the left caudate nucleus. Vascular: Major intracranial vascular flow voids are preserved. Skull and upper cervical spine: Unremarkable bone marrow signal. Sinuses/Orbits: Prior bilateral cataract extraction. Paranasal sinuses and mastoid air cells are clear. Other: None. MRA HEAD FINDINGS The visualized distal left vertebral artery is widely patent and strongly dominant. The distal right vertebral artery is hypoplastic with superimposed tandem severe stenoses. The left PICA, right AICA, and bilateral SCA origins are patent. Basilar artery is widely patent. PCAs are patent with a moderate proximal right P2 stenosis noted. The internal carotid arteries are widely patent from skullbase to carotid termini. The right A1 segment is slightly dominant. There is a 4 x 3 mm superiorly directed outpouching from the proximal to mid left M1 segment with appearance most compatible with an aneurysm rather than occluded branch vessel. The left M1 segment is widely patent, as is what appears to be a normal left MCA bifurcation. There does not appear to be a decreased number of left MCA branch vessels compared to the contralateral side. IMPRESSION: 1. Patchy acute infarcts throughout the left cerebral hemisphere which may reflect emboli or hypoperfusion. 2. Mild-to-moderate chronic small vessel ischemic disease. 3. No intracranial large vessel  occlusion. 4. 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 The dominant distal left vertebral artery is widely patent. 5. Moderate proximal right P2 stenosis. 6. 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 Electronically Signed   By: Logan Bores M.D.   On: 09/11/2016 08:32   US Carotid Bilateral (at [facility] Only)  Result Date:  09/11/2016 CLINICAL DATA:  TIA/stroke. Hypertension, hyperlipidemia, previous tobacco abuse. EXAM: BILATERAL CAROTID DUPLEX ULTRASOUND TECHNIQUE: Pearline Cables scale imaging, color Doppler and duplex ultrasound was performed of bilateral carotid and vertebral arteries in the neck. COMPARISON:  None available TECHNIQUE: Quantification of carotid stenosis is based on velocity parameters that correlate the residual internal carotid diameter with NASCET-based stenosis levels, using the diameter of the distal internal carotid lumen as the denominator for stenosis measurement. The following velocity measurements were obtained: PEAK SYSTOLIC/END DIASTOLIC RIGHT ICA:                     78/16cm/sec CCA:                     16/10RU/EAV SYSTOLIC ICA/CCA RATIO:  1.3 DIASTOLIC ICA/CCA RATIO: 1.5 ECA:                     141cm/sec LEFT ICA:                     661/311cm/sec CCA:                     40/98JX/BJY SYSTOLIC ICA/CCA RATIO:  78.2 DIASTOLIC ICA/CCA RATIO: 95.6 ECA:                     91cm/sec FINDINGS: RIGHT CAROTID ARTERY: Scattered calcified plaque through the common carotid artery and bulb extending into the proximal internal and external carotid arteries. No high-grade stenosis. Normal waveforms and color Doppler signal. RIGHT VERTEBRAL ARTERY:  Normal flow direction and waveform. LEFT CAROTID ARTERY: Smooth nonocclusive plaque through the common carotid artery. Heavily calcified plaque in the carotid bulb. There is significant eccentric plaque in both of the proximal internal and external carotid arteries with focal areas of stenosis. Markedly elevated peak systolic velocities in the proximal ICA with spectral broadening and focal aliasing on color Doppler ; distally a parvus tardus waveform. Mildly elevated peak systolic velocities in the external carotid artery. LEFT VERTEBRAL ARTERY: Normal flow direction and waveform. IMPRESSION: 1. Bilateral carotid bifurcation and proximal ICA plaque, resulting in NEAR OCCLUSIVE  STENOSIS of the left ICA, less than 50% diameter stenosis of the right ICA. Recommend vascular surgical or neurointerventional radiology consultation. 2.  Antegrade bilateral vertebral arterial flow. Electronically Signed   By: Lucrezia Europe M.D.   On: 09/11/2016 09:06   Mr Jodene Nam Head/brain OZ Cm  Result Date: 09/11/2016 CLINICAL DATA:  Right upper extremity numbness and weakness. Aphasia. EXAM: MRI HEAD WITHOUT CONTRAST MRA HEAD WITHOUT CONTRAST TECHNIQUE: Multiplanar, multiecho pulse sequences of the brain and surrounding structures were obtained without intravenous contrast. Angiographic images of the head were obtained using MRA technique without contrast. COMPARISON:  Head CT 09/10/2016 FINDINGS: MRI HEAD FINDINGS Brain: There are small foci of patchy acute cortical and white matter infarction throughout the left cerebral hemisphere. The parietal lobe is involved to the greatest extent, however there is also involvement of frontal lobe cortex at the vertex, centrum semiovale, posterior and lateral temporal lobe, and lateral occipital lobe. There is also a punctate acute infarct in  the posterior aspect of the external capsule. There is no evidence of intracranial hemorrhage, mass, midline shift, or extra-axial fluid collection. Mild generalized cerebral atrophy is within normal limits for age. Patchy T2 hyperintensities in the cerebral white matter bilaterally are nonspecific but compatible with mild-to-moderate chronic small vessel ischemic disease. There is a chronic lacunar infarct in the body of the left caudate nucleus. Vascular: Major intracranial vascular flow voids are preserved. Skull and upper cervical spine: Unremarkable bone marrow signal. Sinuses/Orbits: Prior bilateral cataract extraction. Paranasal sinuses and mastoid air cells are clear. Other: None. MRA HEAD FINDINGS The visualized distal left vertebral artery is widely patent and strongly dominant. The distal right vertebral artery is  hypoplastic with superimposed tandem severe stenoses. The left PICA, right AICA, and bilateral SCA origins are patent. Basilar artery is widely patent. PCAs are patent with a moderate proximal right P2 stenosis noted. The internal carotid arteries are widely patent from skullbase to carotid termini. The right A1 segment is slightly dominant. There is a 4 x 3 mm superiorly directed outpouching from the proximal to mid left M1 segment with appearance most compatible with an aneurysm rather than occluded branch vessel. The left M1 segment is widely patent, as is what appears to be a normal left MCA bifurcation. There does not appear to be a decreased number of left MCA branch vessels compared to the contralateral side. IMPRESSION: 1. Patchy acute infarcts throughout the left cerebral hemisphere which may reflect emboli or hypoperfusion. 2. Mild-to-moderate chronic small vessel ischemic disease. 3. No intracranial large vessel occlusion. 4. 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 The dominant distal left vertebral artery is widely patent. 5. Moderate proximal right P2 stenosis. 6. 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 Electronically Signed   By: Logan Bores M.D.   On: 09/11/2016 08:32   Dg Inject Diag/thera/inc Needle/cath/plc Epi/lumb/sac W/img  Result Date: 08/27/2016 CLINICAL DATA:  Left lower extremity radiculitis. Displacement of the lumbar discs at L2-3, L3-4, L4-5, and L5-S1. FLUOROSCOPY TIME:  Radiation Exposure Index (as provided by the fluoroscopic device): 39.65 uGy*m2 Fluoroscopy Time:  30 seconds Number of Acquired Images:  0 PROCEDURE: The procedure, risks, benefits, and alternatives were explained to the patient. Questions regarding the procedure were encouraged and answered. The patient understands and consents to the procedure. LUMBAR EPIDURAL INJECTION: An interlaminar approach was performed on left at L4-5. The overlying skin was cleansed and anesthetized. A 20 gauge  epidural needle was advanced using loss-of-resistance technique. DIAGNOSTIC EPIDURAL INJECTION: Injection of Isovue-M 200 shows a good epidural pattern with spread above and below the level of needle placement, primarily on the left no vascular opacification is seen. THERAPEUTIC EPIDURAL INJECTION: 120 Mg of Depo-Medrol mixed with 3 mL 1% lidocaine were instilled. The procedure was well-tolerated, and the patient was discharged thirty minutes following the injection in good condition. COMPLICATIONS: None IMPRESSION: Technically successful epidural injection on the left L4-5 # 2 Electronically Signed   By: San Morelle M.D.   On: 08/27/2016 15:26    Microbiology: No results found for this or any previous visit (from the past 240 hour(s)).   Labs: Basic Metabolic Panel:  Recent Labs Lab 09/10/16 1626  NA 133*  K 4.7  CL 103  CO2 20*  GLUCOSE 130*  BUN 28*  CREATININE 1.47*  CALCIUM 9.6   Liver Function Tests: No results for input(s): AST, ALT, ALKPHOS, BILITOT, PROT, ALBUMIN in the last 168 hours. No results for input(s): LIPASE, AMYLASE in the last  168 hours. No results for input(s): AMMONIA in the last 168 hours. CBC:  Recent Labs Lab 09/10/16 1626  WBC 14.1*  HGB 14.9  HCT 45.0  MCV 95.5  PLT 236   Cardiac Enzymes:  Recent Labs Lab 09/10/16 1634 09/11/16 0252 09/11/16 0927  TROPONINI 0.06* 0.06* 0.06*   BNP: BNP (last 3 results) No results for input(s): BNP in the last 8760 hours.  ProBNP (last 3 results) No results for input(s): PROBNP in the last 8760 hours.  CBG:  Recent Labs Lab 09/10/16 1645  GLUCAP 120*       Signed:  HERNANDEZ ACOSTA,ESTELA  [facility] Pager: [phone] 09/11/2016, 4:46 PM

## 2016-09-11 NOTE — Care Management Important Message (Signed)
Important Message  Patient Details  Name: Ronald Colon MRN: [id number] Date of Birth: 07-Jan-1929   Medicare Important Message Given:  Yes    Sherald Barge, RN 09/11/2016, 12:50 PM

## 2016-09-11 NOTE — Care Management Note (Signed)
Case Management Note  Patient Details  Name: Ronald Colon MRN: [id number] Date of Birth: [birth year]  Subjective/Objective:                  Pt admitted with CVA. He is from home. He is ind with ADL's. He is experiencing right-sided weakness as result of this stroke. Pt being discharged home today. Pt's son at bedside to provide transportation. Pt offered [facility] services and has refused. Pt is very active and drives himself. Pt offered OP PT and he has agreed. He wishes to complete OP PT at the [facility]. Referral sent and received by facility. No DME needs.   Action/Plan: Pt discharging home today with OP PT referral.   Expected Discharge Date:  09/11/16               Expected Discharge Plan:  Home/Self Care  In-House Referral:  NA  Discharge planning Services  CM Consult  Post Acute Care Choice:  NA Choice offered to:  NA  Status of Service:  Completed, signed off  Sherald Barge, RN 09/11/2016, 12:47 PM

## 2016-09-11 NOTE — Evaluation (Signed)
   Occupational Therapy Evaluation Patient Details Name: Ronald Colon MRN: [id number] DOB: October 20, 1928 Today's Date: 09/11/2016    History of Present Illness Ronald Colon is a 83 y.o. male with medical history significant of HTN, HLD, and gout.  He reports "I thought I had a stroke."  R upper extremity numbness and weakness starting about 3 days ago.  Intermittent numbness.  No facial symptoms by his report, but his son reported right facial droop.  Expressive apahasia yesterday, worse today but now resolved by his report.  No LE symptoms.  No headache.  Ambualtes without diffucilty.  Some balance disturbance.  No dysphagia.    Clinical Impression   Pt awake, alert, oriented x4 this am, agreeable to OT evaluation. Pt reports all symptoms have resolved and he feels like himself. Pt demonstrates independence in ADL completion and functional mobility, strength is WFL, sensation and coordination are intact. No further OT services required at this time.     Follow Up Recommendations  No OT follow up    Equipment Recommendations  None recommended by OT       Precautions / Restrictions Precautions Precautions: None Restrictions Weight Bearing Restrictions: No      Mobility Bed Mobility Overal bed mobility: Independent                Transfers Overall transfer level: Independent                         ADL Overall ADL's : Independent;At baseline                                             Vision Vision Assessment?: No apparent visual deficits          Pertinent Vitals/Pain Pain Assessment: No/denies pain     Hand Dominance Right   Extremity/Trunk Assessment Upper Extremity Assessment Upper Extremity Assessment: Overall WFL for tasks assessed   Lower Extremity Assessment Lower Extremity Assessment: Defer to PT evaluation   Cervical / Trunk Assessment Cervical / Trunk Assessment: Normal   Communication  Communication Communication: No difficulties   Cognition Arousal/Alertness: Awake/alert Behavior During Therapy: WFL for tasks assessed/performed Overall Cognitive Status: Within Functional Limits for tasks assessed                                Home Living Family/patient expects to be discharged to:: Private residence Living Arrangements: Spouse/significant other Available Help at Discharge: Family;Available 24 hours/day Type of Home: House             Bathroom Shower/Tub: Teacher, early years/pre: Standard     Home Equipment: None          Prior Functioning/Environment Level of Independence: Independent                  End of Session    Activity Tolerance: Patient tolerated treatment well Patient left: in chair;with call bell/phone within reach   Time: [phone] OT Time Calculation (min): 16 min Charges:  OT General Charges $OT Visit: 1 Procedure OT Evaluation $OT Eval Low Complexity: 1 Procedure  Guadelupe Sabin, OTR/L  [phone] 09/11/2016, 9:40 AM

## 2016-09-12 ENCOUNTER — Encounter: Payer: Self-pay | Admitting: Vascular Surgery

## 2016-09-12 ENCOUNTER — Encounter (HOSPITAL_COMMUNITY): Payer: Self-pay | Admitting: General Practice

## 2016-09-12 ENCOUNTER — Inpatient Hospital Stay (HOSPITAL_COMMUNITY)
Admission: AD | Admit: 2016-09-12 | Discharge: 2016-09-19 | DRG: 038 | Disposition: A | Discharge status: Home / Self Care | Payer: Medicare Other | Source: Ambulatory Visit | Attending: Vascular Surgery | Admitting: Vascular Surgery

## 2016-09-12 ENCOUNTER — Ambulatory Visit (INDEPENDENT_AMBULATORY_CARE_PROVIDER_SITE_OTHER): Payer: Medicare Other | Admitting: Vascular Surgery

## 2016-09-12 VITALS — BP 88/56 | HR 71 | Temp 97.0°F | Resp 18 | Ht 74.0 in | Wt 222.0 lb

## 2016-09-12 DIAGNOSIS — Z7901 Long term (current) use of anticoagulants: Secondary | ICD-10-CM | POA: Diagnosis not present

## 2016-09-12 DIAGNOSIS — Z823 Family history of stroke: Secondary | ICD-10-CM | POA: Diagnosis not present

## 2016-09-12 DIAGNOSIS — I248 Other forms of acute ischemic heart disease: Secondary | ICD-10-CM | POA: Diagnosis present

## 2016-09-12 DIAGNOSIS — R778 Other specified abnormalities of plasma proteins: Secondary | ICD-10-CM

## 2016-09-12 DIAGNOSIS — Z961 Presence of intraocular lens: Secondary | ICD-10-CM | POA: Diagnosis present

## 2016-09-12 DIAGNOSIS — N179 Acute kidney failure, unspecified: Secondary | ICD-10-CM | POA: Diagnosis present

## 2016-09-12 DIAGNOSIS — I4891 Unspecified atrial fibrillation: Secondary | ICD-10-CM | POA: Diagnosis not present

## 2016-09-12 DIAGNOSIS — I482 Chronic atrial fibrillation, unspecified: Secondary | ICD-10-CM

## 2016-09-12 DIAGNOSIS — Z9842 Cataract extraction status, left eye: Secondary | ICD-10-CM | POA: Diagnosis not present

## 2016-09-12 DIAGNOSIS — I63132 Cerebral infarction due to embolism of left carotid artery: Secondary | ICD-10-CM

## 2016-09-12 DIAGNOSIS — E785 Hyperlipidemia, unspecified: Secondary | ICD-10-CM

## 2016-09-12 DIAGNOSIS — I6320 Cerebral infarction due to unspecified occlusion or stenosis of unspecified precerebral arteries: Secondary | ICD-10-CM

## 2016-09-12 DIAGNOSIS — I6522 Occlusion and stenosis of left carotid artery: Secondary | ICD-10-CM

## 2016-09-12 DIAGNOSIS — I459 Conduction disorder, unspecified: Secondary | ICD-10-CM | POA: Diagnosis not present

## 2016-09-12 DIAGNOSIS — I959 Hypotension, unspecified: Secondary | ICD-10-CM | POA: Diagnosis present

## 2016-09-12 DIAGNOSIS — Z87891 Personal history of nicotine dependence: Secondary | ICD-10-CM | POA: Diagnosis not present

## 2016-09-12 DIAGNOSIS — Z9841 Cataract extraction status, right eye: Secondary | ICD-10-CM | POA: Diagnosis not present

## 2016-09-12 DIAGNOSIS — E860 Dehydration: Secondary | ICD-10-CM | POA: Diagnosis present

## 2016-09-12 DIAGNOSIS — R7989 Other specified abnormal findings of blood chemistry: Secondary | ICD-10-CM

## 2016-09-12 DIAGNOSIS — G8191 Hemiplegia, unspecified affecting right dominant side: Secondary | ICD-10-CM | POA: Diagnosis present

## 2016-09-12 DIAGNOSIS — M109 Gout, unspecified: Secondary | ICD-10-CM | POA: Diagnosis present

## 2016-09-12 DIAGNOSIS — K5900 Constipation, unspecified: Secondary | ICD-10-CM | POA: Diagnosis present

## 2016-09-12 DIAGNOSIS — R4701 Aphasia: Secondary | ICD-10-CM | POA: Diagnosis present

## 2016-09-12 DIAGNOSIS — I63212 Cerebral infarction due to unspecified occlusion or stenosis of left vertebral arteries: Secondary | ICD-10-CM | POA: Diagnosis not present

## 2016-09-12 DIAGNOSIS — E1122 Type 2 diabetes mellitus with diabetic chronic kidney disease: Secondary | ICD-10-CM | POA: Diagnosis present

## 2016-09-12 DIAGNOSIS — Z7982 Long term (current) use of aspirin: Secondary | ICD-10-CM

## 2016-09-12 DIAGNOSIS — R011 Cardiac murmur, unspecified: Secondary | ICD-10-CM | POA: Diagnosis not present

## 2016-09-12 DIAGNOSIS — Z86010 Personal history of colonic polyps: Secondary | ICD-10-CM | POA: Diagnosis not present

## 2016-09-12 DIAGNOSIS — M1990 Unspecified osteoarthritis, unspecified site: Secondary | ICD-10-CM | POA: Diagnosis present

## 2016-09-12 DIAGNOSIS — R748 Abnormal levels of other serum enzymes: Secondary | ICD-10-CM | POA: Diagnosis not present

## 2016-09-12 DIAGNOSIS — N182 Chronic kidney disease, stage 2 (mild): Secondary | ICD-10-CM | POA: Diagnosis present

## 2016-09-12 DIAGNOSIS — I6789 Other cerebrovascular disease: Secondary | ICD-10-CM | POA: Diagnosis not present

## 2016-09-12 DIAGNOSIS — I671 Cerebral aneurysm, nonruptured: Secondary | ICD-10-CM | POA: Diagnosis present

## 2016-09-12 DIAGNOSIS — I129 Hypertensive chronic kidney disease with stage 1 through stage 4 chronic kidney disease, or unspecified chronic kidney disease: Secondary | ICD-10-CM | POA: Diagnosis present

## 2016-09-12 DIAGNOSIS — I63232 Cerebral infarction due to unspecified occlusion or stenosis of left carotid arteries: Secondary | ICD-10-CM | POA: Diagnosis not present

## 2016-09-12 DIAGNOSIS — I6523 Occlusion and stenosis of bilateral carotid arteries: Secondary | ICD-10-CM | POA: Diagnosis not present

## 2016-09-12 DIAGNOSIS — N183 Chronic kidney disease, stage 3 (moderate): Secondary | ICD-10-CM | POA: Diagnosis not present

## 2016-09-12 DIAGNOSIS — I6529 Occlusion and stenosis of unspecified carotid artery: Secondary | ICD-10-CM | POA: Diagnosis present

## 2016-09-12 HISTORY — DX: Cerebral infarction, unspecified: I63.9

## 2016-09-12 HISTORY — DX: Occlusion and stenosis of unspecified carotid artery: I65.29

## 2016-09-12 HISTORY — DX: Rheumatic fever without heart involvement: I00

## 2016-09-12 LAB — CBC
HEMATOCRIT: 41.9 % (ref 39.0–52.0)
HEMOGLOBIN: 14 g/dL (ref 13.0–17.0)
MCH: 32 pg (ref 26.0–34.0)
MCHC: 33.4 g/dL (ref 30.0–36.0)
MCV: 95.7 fL (ref 78.0–100.0)
Platelets: 194 10*3/uL (ref 150–400)
RBC: 4.38 MIL/uL (ref 4.22–5.81)
RDW: 13.6 % (ref 11.5–15.5)
WBC: 12.7 10*3/uL — ABNORMAL HIGH (ref 4.0–10.5)

## 2016-09-12 LAB — URINALYSIS, ROUTINE W REFLEX MICROSCOPIC
Bilirubin Urine: NEGATIVE
GLUCOSE, UA: NEGATIVE mg/dL
Hgb urine dipstick: NEGATIVE
KETONES UR: 5 mg/dL — AB
Leukocytes, UA: NEGATIVE
Nitrite: NEGATIVE
PROTEIN: 100 mg/dL — AB
Specific Gravity, Urine: 1.023 (ref 1.005–1.030)
pH: 5 (ref 5.0–8.0)

## 2016-09-12 LAB — COMPREHENSIVE METABOLIC PANEL
ALBUMIN: 3.8 g/dL (ref 3.5–5.0)
ALK PHOS: 45 U/L (ref 38–126)
ALT: 24 U/L (ref 17–63)
AST: 24 U/L (ref 15–41)
Anion gap: 12 (ref 5–15)
BILIRUBIN TOTAL: 0.8 mg/dL (ref 0.3–1.2)
BUN: 38 mg/dL — AB (ref 6–20)
CALCIUM: 9.8 mg/dL (ref 8.9–10.3)
CO2: 21 mmol/L — ABNORMAL LOW (ref 22–32)
CREATININE: 2.16 mg/dL — AB (ref 0.61–1.24)
Chloride: 107 mmol/L (ref 101–111)
GFR calc Af Amer: 30 mL/min — ABNORMAL LOW (ref >60)
GFR, EST NON AFRICAN AMERICAN: 26 mL/min — AB (ref >60)
Glucose, Bld: 129 mg/dL — ABNORMAL HIGH (ref 65–99)
Potassium: 5.1 mmol/L (ref 3.5–5.1)
Sodium: 140 mmol/L (ref 135–145)
TOTAL PROTEIN: 6.3 g/dL — AB (ref 6.5–8.1)

## 2016-09-12 LAB — HEMOGLOBIN A1C
HEMOGLOBIN A1C: 6.8 % — AB (ref 4.8–5.6)
Mean Plasma Glucose: 148 mg/dL

## 2016-09-12 LAB — PROTIME-INR
INR: 1
Prothrombin Time: 13.2 seconds (ref 11.4–15.2)

## 2016-09-12 LAB — TROPONIN I
TROPONIN I: 0.07 ng/mL — AB (ref <0.03)
TROPONIN I: 0.07 ng/mL — AB (ref <0.03)

## 2016-09-12 MED ORDER — ONDANSETRON HCL 4 MG/2ML IJ SOLN: 4.0000 mg | Freq: Four times a day (QID) | INTRAMUSCULAR | Status: DC | PRN

## 2016-09-12 MED ORDER — PANTOPRAZOLE SODIUM 40 MG PO TBEC
40.0000 mg | DELAYED_RELEASE_TABLET | Freq: Every day | ORAL | Status: DC
  Administered 2016-09-12 – 2016-09-16 (×5): 40 mg via ORAL
  Filled 2016-09-12 (×5): qty 1

## 2016-09-12 MED ORDER — ENOXAPARIN SODIUM 40 MG/0.4ML ~~LOC~~ SOLN
40.0000 mg | SUBCUTANEOUS | Status: DC
  Administered 2016-09-12: 40 mg via SUBCUTANEOUS
  Filled 2016-09-12: qty 0.4

## 2016-09-12 MED ORDER — METOPROLOL TARTRATE 5 MG/5ML IV SOLN: 2.0000 mg | INTRAVENOUS | Status: DC | PRN

## 2016-09-12 MED ORDER — SODIUM CHLORIDE 0.9 % IV SOLN: 250.0000 mL | INTRAVENOUS | Status: DC | PRN

## 2016-09-12 MED ORDER — ALLOPURINOL 300 MG PO TABS
300.0000 mg | ORAL_TABLET | Freq: Every day | ORAL | Status: DC
  Administered 2016-09-13 – 2016-09-19 (×6): 300 mg via ORAL
  Filled 2016-09-12 (×6): qty 1

## 2016-09-12 MED ORDER — OMEGA-3-ACID ETHYL ESTERS 1 G PO CAPS
1.0000 g | ORAL_CAPSULE | Freq: Every day | ORAL | Status: DC
  Administered 2016-09-13 – 2016-09-19 (×6): 1 g via ORAL
  Filled 2016-09-12 (×6): qty 1

## 2016-09-12 MED ORDER — EZETIMIBE 10 MG PO TABS
10.0000 mg | ORAL_TABLET | Freq: Every day | ORAL | Status: DC
  Administered 2016-09-13 – 2016-09-19 (×6): 10 mg via ORAL
  Filled 2016-09-12 (×6): qty 1

## 2016-09-12 MED ORDER — LABETALOL HCL 5 MG/ML IV SOLN: 10.0000 mg | INTRAVENOUS | Status: DC | PRN

## 2016-09-12 MED ORDER — DILTIAZEM HCL 100 MG IV SOLR
5.0000 mg/h | INTRAVENOUS | Status: DC
  Administered 2016-09-12: 5 mg/h via INTRAVENOUS
  Filled 2016-09-12: qty 100

## 2016-09-12 MED ORDER — ALUM & MAG HYDROXIDE-SIMETH 200-200-20 MG/5ML PO SUSP: 15.0000 mL | ORAL | Status: DC | PRN

## 2016-09-12 MED ORDER — POTASSIUM CHLORIDE CRYS ER 20 MEQ PO TBCR
20.0000 meq | EXTENDED_RELEASE_TABLET | Freq: Once | ORAL | Status: DC
  Filled 2016-09-12 (×2): qty 2

## 2016-09-12 MED ORDER — ASPIRIN EC 81 MG PO TBEC: 81.0000 mg | DELAYED_RELEASE_TABLET | Freq: Every day | ORAL | Status: DC

## 2016-09-12 MED ORDER — GUAIFENESIN-DM 100-10 MG/5ML PO SYRP: 15.0000 mL | ORAL_SOLUTION | ORAL | Status: DC | PRN

## 2016-09-12 MED ORDER — PHENOL 1.4 % MT LIQD: 1.0000 | OROMUCOSAL | Status: DC | PRN

## 2016-09-12 MED ORDER — SODIUM CHLORIDE 0.9% FLUSH: 3.0000 mL | INTRAVENOUS | Status: DC | PRN

## 2016-09-12 MED ORDER — SODIUM CHLORIDE 0.9% FLUSH
3.0000 mL | Freq: Two times a day (BID) | INTRAVENOUS | Status: DC
  Administered 2016-09-13 – 2016-09-16 (×7): 3 mL via INTRAVENOUS

## 2016-09-12 MED ORDER — ENOXAPARIN SODIUM 30 MG/0.3ML ~~LOC~~ SOLN: 30.0000 mg | SUBCUTANEOUS | Status: DC

## 2016-09-12 MED ORDER — HYDRALAZINE HCL 20 MG/ML IJ SOLN: 5.0000 mg | INTRAMUSCULAR | Status: DC | PRN

## 2016-09-12 NOTE — Progress Notes (Signed)
Referring Physician: Dr Deniece Ree, [facility]  Patient name: Ronald Colon MRN: [id number] DOB: 09-14-1928 Sex: male  REASON FOR CONSULT: acute stroke with carotid stenosis  HPI: Ronald Colon is a 83 y.o. male who 3 days ago had acute onset of some weakness numbness in his right arm and difficulty finding his words with his speech. He presented to the [facility] emergency room. He was found to have acute stroke with showering of emboli to his left brain. He was also found to have a high-grade left internal carotid artery stenosis by duplex exam. He was originally offered transfer to [facility] and evaluation by my partner Dr. Scot Dock. However the patient decided he wanted to go home. He was then discharged from [facility] yesterday. The patient called our office today wishing to be seen. The patient has not really had any progression of his symptoms. The numbness in his right arm seems to have improved. He is still having difficulty gathering his words. Additionally, he felt lightheaded and are parking lot and actually fell on entrance to our office today. He still has some dizziness. He was discharged on aspirin and Zetia. He also takes fish oil. He has a history of hypertension and is on AZOR for this. Other medical problems include arthritis and gout in addition to his hypertension and hyperlipidemia. He smoked in the remote past.   Past Medical History:  Diagnosis Date  . Arthritis   . Colon polyp   . Diverticulosis   . Essential hypertension, benign   . Gout   . Heart murmur   . Hyperlipidemia    Past Surgical History:  Procedure Laterality Date  . CATARACT EXTRACTION W/PHACO Right 08/02/2014   Procedure: CATARACT EXTRACTION PHACO AND INTRAOCULAR LENS PLACEMENT; CDE:  12.77;  Surgeon: Williams Che, MD;  Location: [facility] ORS;  Service: Ophthalmology;  Laterality: Right;  . CATARACT EXTRACTION W/PHACO Left 02/15/2015   Procedure: CATARACT EXTRACTION PHACO AND INTRAOCULAR LENS  PLACEMENT (IOC);  Surgeon: Williams Che, MD;  Location: [facility];  Service: Ophthalmology;  Laterality: Left;  CDE 18.00  . COLONOSCOPY    . ELBOW SURGERY    . ESOPHAGEAL DILATION  09/03/2013   Procedure: ESOPHAGEAL DILATION;  Surgeon: Rogene Houston, MD;  Location: [facility];  Service: Endoscopy;;  . ESOPHAGOGASTRODUODENOSCOPY N/A 09/03/2013   Procedure: ESOPHAGOGASTRODUODENOSCOPY (EGD);  Surgeon: Rogene Houston, MD;  Location: [facility];  Service: Endoscopy;  Laterality: N/A;  255-rescheduled to 1030 Ann to notify pt    Family History  Problem Relation Age of Onset  . CVA Mother 57  . Pneumonia Father 25  . Colon cancer Neg Hx     SOCIAL HISTORY: Social History   Social History  . Marital status: Married    Spouse name: N/A  . Number of children: 1  . Years of education: N/A   Occupational History  . Retired    Social History Main Topics  . Smoking status: Former Smoker    Packs/day: 1.00    Years: 20.00    Types: Cigarettes    Quit date: 62  . Smokeless tobacco: Never Used  . Alcohol use No  . Drug use: No  . Sexual activity: Not on file   Other Topics Concern  . Not on file   Social History Narrative  . No narrative on file    No Known Allergies  Current Outpatient Prescriptions  Medication Sig Dispense Refill  . allopurinol (ZYLOPRIM) 300 MG  tablet Take 300 mg by mouth daily.    Marland Kitchen aspirin EC 81 MG tablet Take 1 tablet (81 mg total) by mouth daily.    . Aspirin-Salicylamide-Caffeine (BC HEADACHE) 325-95-16 MG TABS Take 1 packet by mouth daily as needed (for pain).    Marland Kitchen ezetimibe (ZETIA) 10 MG tablet Take 10 mg by mouth daily.    . fish oil-omega-3 fatty acids 1000 MG capsule Take 1 g by mouth daily.    Marland Kitchen amLODipine-olmesartan (AZOR) 5-40 MG per tablet Take 1 tablet by mouth daily.      . predniSONE (DELTASONE) 10 MG tablet TAKE 5 TABLETS X 3 DAYS, 4 TABS X 3 DAYS, 3 TABS X 3 DAYS, 2 TABS X 3 DAYS,THEN 1 TAB X 3 DAYS  0   No current  facility-administered medications for this visit.     ROS:   General:  No weight loss, Fever, chills  HEENT: No recent headaches, no nasal bleeding, no visual changes, no sore throat  Neurologic: + dizziness, no blackouts, no seizures. + recent symptoms of stroke or mini- stroke. + recent episodes of disorganized speech, no temporary blindness.  Cardiac: No recent episodes of chest pain/pressure, no shortness of breath at rest.  No shortness of breath with exertion.  Denies history of atrial fibrillation or irregular heartbeat  Vascular: No history of rest pain in feet.  No history of claudication.  No history of non-healing ulcer, No history of DVT   Pulmonary: No home oxygen, no productive cough, no hemoptysis,  No asthma or wheezing  Musculoskeletal:  []  Arthritis, []  Low back pain,  [ ]  Joint pain  Hematologic:No history of hypercoagulable state.  No history of easy bleeding.  No history of anemia  Gastrointestinal: No hematochezia or melena,  No gastroesophageal reflux, no trouble swallowing  Urinary: [ ]  chronic Kidney disease, [ ]  on HD - [ ]  MWF or [ ]  TTHS, [ ]  Burning with urination, [ ]  Frequent urination, [ ]  Difficulty urinating;   Skin: No rashes  Psychological: No history of anxiety,  No history of depression   Physical Examination  Vitals:   09/12/16 1434 09/12/16 1439  BP: (!) 88/47 (!) 88/56  Pulse: 71 71  Resp: 18   Temp: 97 F (36.1 C)   SpO2: 98%   Weight: 222 lb (100.7 kg)   Height: 6\' 2"  (1.88 m)     Body mass index is 28.5 kg/m.  General:  Alert and oriented, no acute distress HEENT: Normal Neck: No bruit or JVD Pulmonary: Clear to auscultation bilaterally Cardiac: Regular Rate and Rhythm  Abdomen: Soft, non-tender, non-distended Skin: No rash Extremity Pulses:  2+ radial, brachial, femoral, absent dorsalis pedis, posterior tibial pulses bilaterally Musculoskeletal: No deformity or edema  Neurologic: Upper and lower extremity motor 5/5  and symmetric but some discoordination fine motor right hand, unable to come up with appropriate words when trying to communicate but no slurring  DATA:  Duplex Forestine Na 09/11/2016. Greater than 80% left internal carotid artery stenosis with velocities of 661/311 no significant right side stenosis antegrade vertebral flow  CT scan head Gen. 29th 2018 small vessel ischemic changes with atrophy  MRI brain 09/11/2016 multiple scattered areas of infarct left brain consistent with emboli    ASSESSMENT:  Acute stroke primary event probably 72 hours ago. Patient still with difficulty coming up with words. Additionally, today he is hypotensive and fell with no significant injury but obviously needs adjustment of his blood pressure medication   PLAN:  Will admit to 2 W. telemetry. We'll have neurology see as consult to assist with blood pressure management as well as management of his acute stroke. Most likely patient will need left carotid endarterectomy early next week when his blood pressure has been adjusted and is neurologically stable.  Spoke with Dr Erlinda Hong who will have Neuro see pt later today or tomorrow morning  Will hold antihypertensives for now   Ruta Hinds, MD [facility] [place]: [phone] Pager: [phone]

## 2016-09-12 NOTE — Progress Notes (Signed)
Dr Oneida Alar paged re: elevated troponin level at 0.07. Slightly up from prior level at 0.06.

## 2016-09-12 NOTE — Progress Notes (Signed)
Vitals:   09/12/16 1434  BP: (!) 88/47  Pulse: 71  Resp: 18  Temp: 97 F (36.1 C)  SpO2: 98%  Weight: 222 lb (100.7 kg)  Height: 6\' 2"  (1.88 m)

## 2016-09-12 NOTE — ED Notes (Signed)
Pt called and reported wanted to speak to "doctor that discharged me yesterday". Pt reports, "i was confused then and Im confused now." pt reported called follow-up MD yesterday and made an appointment but reports needs to be "seen now". Pt repots "I called my son and he is coming over." pt reports "I don't know what to do." Offered follow-up number for Dr. Scot Dock per discharge summary. Pt remained agitated, and reported " ill call the doctor or come to the ED and get referred to [facility]" and hung up the phone.

## 2016-09-12 NOTE — Progress Notes (Signed)
Called regarding pt in afib HR 120s.  BP 110s. Will try diltiazem if BP tolerates Cycle cardiac enzymes check lytes  Ruta Hinds, MD [facility] [place] Office: [phone] Pager: [phone]

## 2016-09-12 NOTE — Progress Notes (Signed)
Pt HR had been in the 120's to 140's. BP 102/67. Dr Oneida Alar updated with order for Cardizem IV titration.

## 2016-09-13 ENCOUNTER — Inpatient Hospital Stay (HOSPITAL_COMMUNITY): Payer: Medicare Other

## 2016-09-13 DIAGNOSIS — R748 Abnormal levels of other serum enzymes: Secondary | ICD-10-CM

## 2016-09-13 DIAGNOSIS — R7989 Other specified abnormal findings of blood chemistry: Secondary | ICD-10-CM

## 2016-09-13 DIAGNOSIS — I6789 Other cerebrovascular disease: Secondary | ICD-10-CM

## 2016-09-13 DIAGNOSIS — I6522 Occlusion and stenosis of left carotid artery: Secondary | ICD-10-CM

## 2016-09-13 DIAGNOSIS — Z7901 Long term (current) use of anticoagulants: Secondary | ICD-10-CM

## 2016-09-13 DIAGNOSIS — I482 Chronic atrial fibrillation, unspecified: Secondary | ICD-10-CM

## 2016-09-13 DIAGNOSIS — R778 Other specified abnormalities of plasma proteins: Secondary | ICD-10-CM

## 2016-09-13 DIAGNOSIS — I4891 Unspecified atrial fibrillation: Secondary | ICD-10-CM

## 2016-09-13 DIAGNOSIS — I63512 Cerebral infarction due to unspecified occlusion or stenosis of left middle cerebral artery: Secondary | ICD-10-CM

## 2016-09-13 DIAGNOSIS — I63232 Cerebral infarction due to unspecified occlusion or stenosis of left carotid arteries: Secondary | ICD-10-CM

## 2016-09-13 LAB — ECHOCARDIOGRAM COMPLETE
HEIGHTINCHES: 74 in
WEIGHTICAEL: 3257.6 [oz_av]

## 2016-09-13 LAB — BASIC METABOLIC PANEL
ANION GAP: 4 — AB (ref 5–15)
BUN: 38 mg/dL — ABNORMAL HIGH (ref 6–20)
CALCIUM: 9.2 mg/dL (ref 8.9–10.3)
CO2: 24 mmol/L (ref 22–32)
Chloride: 110 mmol/L (ref 101–111)
Creatinine, Ser: 1.76 mg/dL — ABNORMAL HIGH (ref 0.61–1.24)
GFR, EST AFRICAN AMERICAN: 39 mL/min — AB (ref >60)
GFR, EST NON AFRICAN AMERICAN: 34 mL/min — AB (ref >60)
Glucose, Bld: 118 mg/dL — ABNORMAL HIGH (ref 65–99)
POTASSIUM: 5 mmol/L (ref 3.5–5.1)
SODIUM: 138 mmol/L (ref 135–145)

## 2016-09-13 LAB — HEPARIN LEVEL (UNFRACTIONATED): HEPARIN UNFRACTIONATED: 0.93 [IU]/mL — AB (ref 0.30–0.70)

## 2016-09-13 LAB — TROPONIN I: TROPONIN I: 0.08 ng/mL — AB (ref <0.03)

## 2016-09-13 MED ORDER — MAGNESIUM CITRATE PO SOLN
300.0000 mL | Freq: Once | ORAL | Status: AC
  Administered 2016-09-13: 300 mL via ORAL
  Filled 2016-09-13: qty 592

## 2016-09-13 MED ORDER — ASPIRIN EC 325 MG PO TBEC
325.0000 mg | DELAYED_RELEASE_TABLET | Freq: Every day | ORAL | Status: DC
  Administered 2016-09-13: 325 mg via ORAL
  Filled 2016-09-13: qty 1

## 2016-09-13 MED ORDER — HEPARIN (PORCINE) IN NACL 100-0.45 UNIT/ML-% IJ SOLN
1250.0000 [IU]/kg/h | INTRAMUSCULAR | Status: DC
  Filled 2016-09-13: qty 250

## 2016-09-13 MED ORDER — FLEET ENEMA 7-19 GM/118ML RE ENEM: 1.0000 | ENEMA | Freq: Every day | RECTAL | Status: DC | PRN

## 2016-09-13 MED ORDER — ATORVASTATIN CALCIUM 40 MG PO TABS
40.0000 mg | ORAL_TABLET | Freq: Every day | ORAL | Status: DC
  Administered 2016-09-13 – 2016-09-19 (×6): 40 mg via ORAL
  Filled 2016-09-13 (×6): qty 1

## 2016-09-13 MED ORDER — AMIODARONE HCL IN DEXTROSE 360-4.14 MG/200ML-% IV SOLN
60.0000 mg/h | INTRAVENOUS | Status: AC
  Administered 2016-09-13: 60 mg/h via INTRAVENOUS
  Filled 2016-09-13: qty 200

## 2016-09-13 MED ORDER — ASPIRIN EC 81 MG PO TBEC
81.0000 mg | DELAYED_RELEASE_TABLET | Freq: Every day | ORAL | Status: DC
  Administered 2016-09-14 – 2016-09-19 (×5): 81 mg via ORAL
  Filled 2016-09-13 (×5): qty 1

## 2016-09-13 MED ORDER — BISACODYL 10 MG RE SUPP: 10.0000 mg | Freq: Every day | RECTAL | Status: DC | PRN

## 2016-09-13 MED ORDER — HEPARIN (PORCINE) IN NACL 100-0.45 UNIT/ML-% IJ SOLN
950.0000 [IU]/h | INTRAMUSCULAR | Status: DC
  Administered 2016-09-13: 1250 [IU]/h via INTRAVENOUS

## 2016-09-13 MED ORDER — HEPARIN BOLUS VIA INFUSION
3000.0000 [IU] | Freq: Once | INTRAVENOUS | Status: DC
  Filled 2016-09-13: qty 3000

## 2016-09-13 MED ORDER — AMIODARONE HCL IN DEXTROSE 360-4.14 MG/200ML-% IV SOLN
30.0000 mg/h | INTRAVENOUS | Status: DC
Start: 2016-09-13 — End: 2016-09-18
  Administered 2016-09-14 – 2016-09-18 (×8): 30 mg/h via INTRAVENOUS
  Filled 2016-09-13 (×10): qty 200

## 2016-09-13 MED ORDER — HEPARIN (PORCINE) IN NACL 100-0.45 UNIT/ML-% IJ SOLN: 1400.0000 [IU]/h | INTRAMUSCULAR | Status: DC

## 2016-09-13 NOTE — Consult Note (Addendum)
Cardiology Consult    Patient ID: Ronald Colon MRN: [id number], DOB/AGE: 1929/01/13   Admit date: 09/12/2016 Date of Consult: 09/13/2016  Primary Physician: Ronald Kilts, MD Primary Cardiologist: Ronald Colon Requesting Provider: Ruta Colon  Patient Profile    83 y/o man seen several years ago for benign heart murmur with hypertension, hyperlipidemia, gout, chronic osteoarthritis, and new left CVA identified 3 days ago at [facility] who was admitted to [facility] from Vascular Surgery clinic where he fell with symptomatic hypotension and subsequently developed Afib with RVR that was observed since admission. Cardiology was consulted for evaluation and management of his Afib with RVR.  Past Medical History   Past Medical History:  Diagnosis Date  . Arthritis    "RLE; lower back" (09/12/2016)  . Carotid stenosis   . Colon polyp   . Diverticulosis   . Essential hypertension, benign   . Gout   . Heart murmur    "I've had it since age 55 when I had rheumatic fever"  . Hyperlipidemia   . Rheumatic fever 1937  . Stroke ([place]) 09/10/2016   "pain/numbness in RUE; some problems saying the right word since" (09/12/2016)    Past Surgical History:  Procedure Laterality Date  . CATARACT EXTRACTION W/PHACO Right 08/02/2014   Procedure: CATARACT EXTRACTION PHACO AND INTRAOCULAR LENS PLACEMENT; CDE:  12.77;  Surgeon: Williams Che, MD;  Location: [facility] ORS;  Service: Ophthalmology;  Laterality: Right;  . CATARACT EXTRACTION W/PHACO Left 02/15/2015   Procedure: CATARACT EXTRACTION PHACO AND INTRAOCULAR LENS PLACEMENT (IOC);  Surgeon: Williams Che, MD;  Location: [facility];  Service: Ophthalmology;  Laterality: Left;  CDE 18.00  . COLONOSCOPY    . ELBOW SURGERY Right    "grissle in there"  . ESOPHAGEAL DILATION  09/03/2013   Procedure: ESOPHAGEAL DILATION;  Surgeon: Rogene Houston, MD;  Location: [facility] SU[facility];  Service: Endoscopy;;  . ESOPHAGOGASTRODUODENOSCOPY N/A  09/03/2013   Procedure: ESOPHAGOGASTRODUODENOSCOPY (EGD);  Surgeon: Rogene Houston, MD;  Location: [facility] END[facility] SU[facility];  Service: Endoscopy;  Laterality: N/A;  255-rescheduled to 1030 Ann to notify pt     Allergies  No Known Allergies  History of Present Illness    Mr. Ronald Colon presented to [facility] hospital on 1/29 with RUE weakness, parasthesias, right facial droop, and expressive aphasia with symptom improvement within a day. Imaging demonstrated left MCA distribution watershed infarcts with decreased flow distal to a nearly complete left ICA occlusion. He left AMA and followed up one day later at [facility] clinic with Dr. Oneida Colon where he fell from lightheadedness and was found to be hypotensive at 28 SBP with a heart rate of 72 bpm. He was admitted for this and for anticipated carotid endarterectomy Monday if other medical problems are stabilized. By the time he was admitted and placed on telemetry monitoring he developed Afib with RVR up to 130s that was controlled with IV diltiazem and continued until this morning. His heart rate has remained elevated with variable blood pressure 100s-120s. EKG has shown Afib with some conduction defect and no concerning ST segment changes. Serial troponins show mild elevation to 0.06-0.08. He is on bed rest and has been asymptomatic during this admission.  He has previously been seen by Dr. Domenic Colon in Cardiology Clinic in 2015 for evaluation of a chronic systolic heart murmur. He has never had accompanying symptoms with this. He underwent TTE that only showed mild aortic calcifications and regurgitation and trivial mitral regurgitation. He has no other  known cardiac history.  Inpatient Medications    . allopurinol  300 mg Oral Daily  . [START ON 09/14/2016] aspirin EC  81 mg Oral Daily  . atorvastatin  40 mg Oral q1800  . ezetimibe  10 mg Oral Daily  . magnesium citrate  300 mL Oral Once  . omega-3 acid ethyl esters  1 g Oral Daily  . pantoprazole  40 mg Oral Daily   . potassium chloride  20-40 mEq Oral Once  . sodium chloride flush  3 mL Intravenous Q12H   . diltiazem (CARDIZEM) infusion Stopped (09/13/16 0830)  . heparin 1,250 Units/hr (09/13/16 1004)   Outpatient Medications    Prior to Admission medications   Medication Sig Start Date End Date Taking? Authorizing Provider  allopurinol (ZYLOPRIM) 300 MG tablet Take 300 mg by mouth daily.   Yes Historical Provider, MD  amLODipine-olmesartan (AZOR) 5-40 MG per tablet Take 1 tablet by mouth daily.     Yes Historical Provider, MD  aspirin EC 81 MG tablet Take 1 tablet (81 mg total) by mouth daily. 09/11/16  Yes Erline Hau, MD  ezetimibe (ZETIA) 10 MG tablet Take 10 mg by mouth daily.   Yes Historical Provider, MD  fish oil-omega-3 fatty acids 1000 MG capsule Take 1 g by mouth daily.   Yes Historical Provider, MD  Aspirin-Salicylamide-Caffeine (BC HEADACHE) 325-95-16 MG TABS Take 1 packet by mouth daily as needed (for pain).    Historical Provider, MD     Family History    Family History  Problem Relation Age of Onset  . CVA Mother 83  . Pneumonia Father 19  . Colon cancer Neg Hx     Social History    Social History   Social History  . Marital status: Married    Spouse name: N/A  . Number of children: 1  . Years of education: N/A   Occupational History  . Retired    Social History Main Topics  . Smoking status: Former Smoker    Packs/day: 1.00    Years: 20.00    Types: Cigarettes    Quit date: 84  . Smokeless tobacco: Never Used  . Alcohol use No  . Drug use: No  . Sexual activity: Not on file   Other Topics Concern  . Not on file   Social History Narrative  . No narrative on file     Review of Systems    General:  No chills, fever, night sweats or weight changes.  Cardiovascular:  No chest pain, dyspnea on exertion, edema, orthopnea, palpitations, paroxysmal nocturnal dyspnea. Dermatological: No rash, lesions/masses Respiratory: No cough,  dyspnea Urologic: No hematuria, dysuria Abdominal:   No nausea, vomiting, diarrhea, bright red blood per rectum, melena, or hematemesis Neurologic:  Mild right hand clumsiness may be baseline, episode of lightheadedness yesterday. All other systems reviewed and are otherwise negative except as noted above.  Physical Exam    Blood pressure 121/79, pulse (!) 117, temperature 97.8 F (36.6 C), temperature source Oral, resp. rate 18, weight 203 lb 9.6 oz (92.4 kg), SpO2 100 %.  General: Pleasant, NAD Psych: Normal affect. Neuro: Alert and oriented X 3. Moves all extremities spontaneously, strength is intact, RUE 2nd digit movement and sensation decreased distal to PIP HEENT: Normal  Neck: Supple without bruits or JVD. Lungs:  Resp regular and unlabored, CTA. Heart: Tachycardic, irregular rhythm, early systolic murmur is audible over LUSB difficult to characterize Abdomen: Soft, non-tender, non-distended, BS + x 4.  Extremities:  No clubbing, cyanosis or edema. DP/PT/Radials 2+ and equal bilaterally, good capillary refill  Labs    Troponin (Point of Care Test) No results for input(s): TROPIPOC in the last 72 hours.  Recent Labs  09/11/16 0927 09/12/16 1702 09/12/16 2218 09/13/16 0350  TROPONINI 0.06* 0.07* 0.07* 0.08*   Lab Results  Component Value Date   WBC 12.7 (H) 09/12/2016   HGB 14.0 09/12/2016   HCT 41.9 09/12/2016   MCV 95.7 09/12/2016   PLT 194 09/12/2016     Recent Labs Lab 09/12/16 1649 09/13/16 0350  NA 140 138  K 5.1 5.0  CL 107 110  CO2 21* 24  BUN 38* 38*  CREATININE 2.16* 1.76*  CALCIUM 9.8 9.2  PROT 6.3*  --   BILITOT 0.8  --   ALKPHOS 45  --   ALT 24  --   AST 24  --   GLUCOSE 129* 118*   Lab Results  Component Value Date   CHOL 178 09/11/2016   HDL 37 (L) 09/11/2016   LDLCALC 113 (H) 09/11/2016   TRIG 139 09/11/2016   No results found for: [facility]   Radiology Studies    Ct Head Wo Contrast  Result Date: 09/10/2016 CLINICAL DATA:   RIGHT-sided facial droop, aphasia, RIGHT upper extremity weakness and numbness for 2-3 days, history essential benign hypertension, hyperlipidemia EXAM: CT HEAD WITHOUT CONTRAST TECHNIQUE: Contiguous axial images were obtained from the base of the skull through the vertex without intravenous contrast. COMPARISON:  10/21/2003 FINDINGS: Brain: Generalized atrophy. Normal ventricular morphology. No midline shift or mass effect. Small vessel chronic ischemic changes of deep cerebral white matter. No intracranial hemorrhage, mass lesion, or evidence acute infarction. No extra-axial fluid collections. Vascular: Atherosclerotic calcifications of the carotid siphons and vertebral arteries. Skull: Intact Sinuses/Orbits: Clear Other: N/A IMPRESSION: Atrophy with small vessel chronic ischemic changes of deep cerebral white matter. No acute intracranial abnormalities. Electronically Signed   By: Lavonia Dana M.D.   On: 09/10/2016 17:54   Mr Brain Wo Contrast  Result Date: 09/11/2016 CLINICAL DATA:  Right upper extremity numbness and weakness. Aphasia. EXAM: MRI HEAD WITHOUT CONTRAST MRA HEAD WITHOUT CONTRAST TECHNIQUE: Multiplanar, multiecho pulse sequences of the brain and surrounding structures were obtained without intravenous contrast. Angiographic images of the head were obtained using MRA technique without contrast. COMPARISON:  Head CT 09/10/2016 FINDINGS: MRI HEAD FINDINGS Brain: There are small foci of patchy acute cortical and white matter infarction throughout the left cerebral hemisphere. The parietal lobe is involved to the greatest extent, however there is also involvement of frontal lobe cortex at the vertex, centrum semiovale, posterior and lateral temporal lobe, and lateral occipital lobe. There is also a punctate acute infarct in the posterior aspect of the external capsule. There is no evidence of intracranial hemorrhage, mass, midline shift, or extra-axial fluid collection. Mild generalized cerebral  atrophy is within normal limits for age. Patchy T2 hyperintensities in the cerebral white matter bilaterally are nonspecific but compatible with mild-to-moderate chronic small vessel ischemic disease. There is a chronic lacunar infarct in the body of the left caudate nucleus. Vascular: Major intracranial vascular flow voids are preserved. Skull and upper cervical spine: Unremarkable bone marrow signal. Sinuses/Orbits: Prior bilateral cataract extraction. Paranasal sinuses and mastoid air cells are clear. Other: None. MRA HEAD FINDINGS The visualized distal left vertebral artery is widely patent and strongly dominant. The distal right vertebral artery is hypoplastic with superimposed tandem severe stenoses. The left PICA, right AICA, and bilateral SCA origins are  patent. Basilar artery is widely patent. PCAs are patent with a moderate proximal right P2 stenosis noted. The internal carotid arteries are widely patent from skullbase to carotid termini. The right A1 segment is slightly dominant. There is a 4 x 3 mm superiorly directed outpouching from the proximal to mid left M1 segment with appearance most compatible with an aneurysm rather than occluded branch vessel. The left M1 segment is widely patent, as is what appears to be a normal left MCA bifurcation. There does not appear to be a decreased number of left MCA branch vessels compared to the contralateral side. IMPRESSION: 1. Patchy acute infarcts throughout the left cerebral hemisphere which may reflect emboli or hypoperfusion. 2. Mild-to-moderate chronic small vessel ischemic disease. 3. No intracranial large vessel occlusion. 4. 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 The dominant distal left vertebral artery is widely patent. 5. Moderate proximal right P2 stenosis. 6. 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 Electronically Signed   By: Logan Bores M.D.   On: 09/11/2016 08:32   US Carotid Bilateral (at [facility] Only)  Result Date:  09/11/2016 CLINICAL DATA:  TIA/stroke. Hypertension, hyperlipidemia, previous tobacco abuse. EXAM: BILATERAL CAROTID DUPLEX ULTRASOUND TECHNIQUE: Pearline Cables scale imaging, color Doppler and duplex ultrasound was performed of bilateral carotid and vertebral arteries in the neck. COMPARISON:  None available TECHNIQUE: Quantification of carotid stenosis is based on velocity parameters that correlate the residual internal carotid diameter with NASCET-based stenosis levels, using the diameter of the distal internal carotid lumen as the denominator for stenosis measurement. The following velocity measurements were obtained: PEAK SYSTOLIC/END DIASTOLIC RIGHT ICA:                     78/16cm/sec CCA:                     16/01UX/NAT SYSTOLIC ICA/CCA RATIO:  1.3 DIASTOLIC ICA/CCA RATIO: 1.5 ECA:                     141cm/sec LEFT ICA:                     661/311cm/sec CCA:                     55/73UK/GUR SYSTOLIC ICA/CCA RATIO:  42.7 DIASTOLIC ICA/CCA RATIO: 06.2 ECA:                     91cm/sec FINDINGS: RIGHT CAROTID ARTERY: Scattered calcified plaque through the common carotid artery and bulb extending into the proximal internal and external carotid arteries. No high-grade stenosis. Normal waveforms and color Doppler signal. RIGHT VERTEBRAL ARTERY:  Normal flow direction and waveform. LEFT CAROTID ARTERY: Smooth nonocclusive plaque through the common carotid artery. Heavily calcified plaque in the carotid bulb. There is significant eccentric plaque in both of the proximal internal and external carotid arteries with focal areas of stenosis. Markedly elevated peak systolic velocities in the proximal ICA with spectral broadening and focal aliasing on color Doppler ; distally a parvus tardus waveform. Mildly elevated peak systolic velocities in the external carotid artery. LEFT VERTEBRAL ARTERY: Normal flow direction and waveform. IMPRESSION: 1. Bilateral carotid bifurcation and proximal ICA plaque, resulting in NEAR OCCLUSIVE  STENOSIS of the left ICA, less than 50% diameter stenosis of the right ICA. Recommend vascular surgical or neurointerventional radiology consultation. 2.  Antegrade bilateral vertebral arterial flow. Electronically Signed   By: Lucrezia Europe  M.D.   On: 09/11/2016 09:06   Mr Jodene Nam Head/brain ZD Cm  Result Date: 09/11/2016 CLINICAL DATA:  Right upper extremity numbness and weakness. Aphasia. EXAM: MRI HEAD WITHOUT CONTRAST MRA HEAD WITHOUT CONTRAST TECHNIQUE: Multiplanar, multiecho pulse sequences of the brain and surrounding structures were obtained without intravenous contrast. Angiographic images of the head were obtained using MRA technique without contrast. COMPARISON:  Head CT 09/10/2016 FINDINGS: MRI HEAD FINDINGS Brain: There are small foci of patchy acute cortical and white matter infarction throughout the left cerebral hemisphere. The parietal lobe is involved to the greatest extent, however there is also involvement of frontal lobe cortex at the vertex, centrum semiovale, posterior and lateral temporal lobe, and lateral occipital lobe. There is also a punctate acute infarct in the posterior aspect of the external capsule. There is no evidence of intracranial hemorrhage, mass, midline shift, or extra-axial fluid collection. Mild generalized cerebral atrophy is within normal limits for age. Patchy T2 hyperintensities in the cerebral white matter bilaterally are nonspecific but compatible with mild-to-moderate chronic small vessel ischemic disease. There is a chronic lacunar infarct in the body of the left caudate nucleus. Vascular: Major intracranial vascular flow voids are preserved. Skull and upper cervical spine: Unremarkable bone marrow signal. Sinuses/Orbits: Prior bilateral cataract extraction. Paranasal sinuses and mastoid air cells are clear. Other: None. MRA HEAD FINDINGS The visualized distal left vertebral artery is widely patent and strongly dominant. The distal right vertebral artery is  hypoplastic with superimposed tandem severe stenoses. The left PICA, right AICA, and bilateral SCA origins are patent. Basilar artery is widely patent. PCAs are patent with a moderate proximal right P2 stenosis noted. The internal carotid arteries are widely patent from skullbase to carotid termini. The right A1 segment is slightly dominant. There is a 4 x 3 mm superiorly directed outpouching from the proximal to mid left M1 segment with appearance most compatible with an aneurysm rather than occluded branch vessel. The left M1 segment is widely patent, as is what appears to be a normal left MCA bifurcation. There does not appear to be a decreased number of left MCA branch vessels compared to the contralateral side. IMPRESSION: 1. Patchy acute infarcts throughout the left cerebral hemisphere which may reflect emboli or hypoperfusion. 2. Mild-to-moderate chronic small vessel ischemic disease. 3. No intracranial large vessel occlusion. 4. 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 The dominant distal left vertebral artery is widely patent. 5. Moderate proximal right P2 stenosis. 6. 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 Electronically Signed   By: Logan Bores M.D.   On: 09/11/2016 08:32     ECG & Cardiac Imaging    ECG: 09/13/16 @5 :13- Atrial fibrillation with rate of 96, partial right bundle branch block, no significant ST segment abnormality, significantly changed from previous ECG on 1/30 with sinus tachycardia  Assessment & Plan    1. Atrial fibrillation with RVR- New onset of Afib that appears to have started after his falling and hypotension at [facility] clinic yesterday. This continued since admission with rate control using IV diltiazem when initially exceeding 120bpm. Last echocardiogram was benign in 2015 performed for an asymptomatic chronic systolic heart murmur. He has no known coronary arterial disease. -transesophageal echocardiogram already ordered per Neurology service for  stroke work up -Can resume IV diltiazem as needed for persistent HR >120 -Continue trending serial troponin, mild elevation so far in setting of RVR -Continue heparin gtt  2. 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 -ASA 81mg  daily -Neuro stroke service following and [facility] with plans for CEA on Monday if he is otherwise well controlled.  3. Hypertension- Previously controlled on AZOR 5-40 (amlodipine-olmesartan) -Holding antihypertensives at this time  4. Hyperlipidemia- On atorvastatin 40mg  daily PTA -continue high intensity statin  5. Constipation- Active complaint for 2 days -Management per primary team  6. Gout- On allopurinol PTA without any recent attacks -Management per primary team  Signed, Hinton Lovely, MD PGY-II Internal Medicine Resident 09/13/2016, 1:05 PM   The patient was seen, examined and discussed with Hinton Lovely, MD and I agree with the above.   83 y/o man  with hypertension, hyperlipidemia, gout, chronic osteoarthritis, and new left CVA identified 3 days ago at [facility] (L MCA watershed CVA with severe L ICA stenosis) who was admitted to [facility] from Vascular Surgery clinic where he fell with symptomatic hypotension and subsequently developed Afib with RVR that was observed since admission. Cardiology was consulted for evaluation and management of his Afib with RVR. His TTE showed LVEF 50-55%, normal left atrial size, no significant mitral regurgitation. He was hypotensive on diltiazem drip that was held. We will start amiodarone drip, since his a-fib is less than 48  Hours and based on his anatomy this might be his first presentation. Neurology believes that stroke sec to severe carotid stenosis. On Heparin drip for anticoagulation.  Endarterectomy planned for Monday by Dr Ronald Colon. Minimal troponin elevation with flat trend sec to stroke and a-fib with RVR.   Ena Dawley, MD 09/13/2016

## 2016-09-13 NOTE — Progress Notes (Addendum)
[facility]   Subjective  - feels better   Objective (!) 123/102 97 98 F (36.7 C) (Oral) 20 98%  Intake/Output Summary (Last 24 hours) at 09/13/16 0904 Last data filed at 09/13/16 0300  Gross per 24 hour  Intake           265.75 ml  Output              200 ml  Net            65.75 ml   Right hand clumsiness and speech improved No chest pain no dyspnea  EKG afib  Assessment/Planning: 1. Acute stroke most likely atheroembolic from left ICA stenosis.  Probably left CEA on Monday. Spoke with Dr Erlinda Hong yesterday Neurology should be by to see today 2. Afib new onset moderate control with diltiazem last night slightly elevated troponin probably demand not really changed from [facility] 48 hr ago.  Will have cardiology see for assistance with afib. 3.  Renal insufficiency gently hydration for now trend 4. OOB ambulate 5. Leukocytosis most likely reactive improving, UA negative except ketones which suggestive of dehydration may relate to renal issues 6. Treat constipation Ruta Hinds 09/13/2016 9:04 AM --  Laboratory Lab Results:  Recent Labs  09/10/16 1626 09/12/16 1649  WBC 14.1* 12.7*  HGB 14.9 14.0  HCT 45.0 41.9  PLT 236 194   BMET  Recent Labs  09/12/16 1649 09/13/16 0350  NA 140 138  K 5.1 5.0  CL 107 110  CO2 21* 24  GLUCOSE 129* 118*  BUN 38* 38*  CREATININE 2.16* 1.76*  CALCIUM 9.8 9.2    COAG Lab Results  Component Value Date   INR 1.00 09/12/2016   No results found for: PTT

## 2016-09-13 NOTE — Progress Notes (Signed)
ANTICOAGULATION CONSULT NOTE - Initial Consult  Pharmacy Consult for heparin Indication: atrial fibrillation  No Known Allergies  Patient Measurements: Weight: 203 lb 9.6 oz (92.4 kg) Heparin Dosing Weight: 92.4 kg  Vital Signs: Temp: 97.3 F (36.3 C) (02/01 1642) Temp Source: Oral (02/01 1642) BP: 101/78 (02/01 1642) Pulse Rate: 112 (02/01 1642)  Labs:  Recent Labs  09/12/16 1649 09/12/16 1702 09/12/16 2218 09/13/16 0350 09/13/16 1825  HGB 14.0  --   --   --   --   HCT 41.9  --   --   --   --   PLT 194  --   --   --   --   LABPROT 13.2  --   --   --   --   INR 1.00  --   --   --   --   HEPARINUNFRC  --   --   --   --  0.93*  CREATININE 2.16*  --   --  1.76*  --   TROPONINI  --  0.07* 0.07* 0.08*  --     Estimated Creatinine Clearance: 35.7 mL/min (by C-G formula based on SCr of 1.76 mg/dL (H)).  Assessment: Ronald Colon is a 83 y.o. male, pharmacy consulted to dose heparin for new Afib. Pt received a 40 mg dose of subcutaneous Lovenox around 1800 on 1/31. Pt admitted 4 days ago to [facility] d/t acute stroke, primary event >72 hours ago. Neuro to see. Will avoid bolus and aim for lower end of heparin level range d/t recent stroke. Troponins up yesterday and found to be in Afib. CBC wnl, SCr 1.76.  Initial hep lvl high at 0.93 - no issues per rn, no bleeding  Goal of Therapy:  Heparin level 0.3-0.5 units/ml Monitor platelets by anticoagulation protocol: Yes   Plan:  Decrease heparin to 950 units/hr Next level 0200 Continue to monitor H&H and platelets  Levester Fresh, PharmD, BCPS, BCCCP Clinical Pharmacist 09/13/2016 7:18 PM

## 2016-09-13 NOTE — Progress Notes (Signed)
  Echocardiogram 2D Echocardiogram has been performed.  Tresa Res 09/13/2016, 2:05 PM

## 2016-09-13 NOTE — Progress Notes (Signed)
ANTICOAGULATION CONSULT NOTE - Initial Consult  Pharmacy Consult for heparin Indication: atrial fibrillation  No Known Allergies  Patient Measurements: Weight: 203 lb 9.6 oz (92.4 kg) Heparin Dosing Weight: 92.4 kg  Vital Signs: Temp: 98 F (36.7 C) (02/01 0030) Temp Source: Oral (02/01 0030) BP: 123/102 (02/01 0145)  Labs:  Recent Labs  09/10/16 1626  09/12/16 1649 09/12/16 1702 09/12/16 2218 09/13/16 0350  HGB 14.9  --  14.0  --   --   --   HCT 45.0  --  41.9  --   --   --   PLT 236  --  194  --   --   --   LABPROT  --   --  13.2  --   --   --   INR  --   --  1.00  --   --   --   CREATININE 1.47*  --  2.16*  --   --  1.76*  TROPONINI  --   < >  --  0.07* 0.07* 0.08*  < > = values in this interval not displayed.  Estimated Creatinine Clearance: 35.7 mL/min (by C-G formula based on SCr of 1.76 mg/dL (H)).   Medical History: Past Medical History:  Diagnosis Date  . Arthritis    "RLE; lower back" (09/12/2016)  . Carotid stenosis   . Colon polyp   . Diverticulosis   . Essential hypertension, benign   . Gout   . Heart murmur    "I've had it since age 14 when I had rheumatic fever"  . Hyperlipidemia   . Rheumatic fever 1937  . Stroke ([place]) 09/10/2016   "pain/numbness in RUE; some problems saying the right word since" (09/12/2016)    Medications:  Prescriptions Prior to Admission  Medication Sig Dispense Refill Last Dose  . allopurinol (ZYLOPRIM) 300 MG tablet Take 300 mg by mouth daily.   09/12/2016 at Unknown time  . amLODipine-olmesartan (AZOR) 5-40 MG per tablet Take 1 tablet by mouth daily.     09/12/2016 at Unknown time  . aspirin EC 81 MG tablet Take 1 tablet (81 mg total) by mouth daily.   09/12/2016 at Unknown time  . ezetimibe (ZETIA) 10 MG tablet Take 10 mg by mouth daily.   09/12/2016 at Unknown time  . fish oil-omega-3 fatty acids 1000 MG capsule Take 1 g by mouth daily.   09/12/2016 at Unknown time  . Aspirin-Salicylamide-Caffeine (BC HEADACHE)  325-95-16 MG TABS Take 1 packet by mouth daily as needed (for pain).   Unknown at Unknown    Assessment: Ronald Colon is a 83 y.o. male, pharmacy consulted to dose heparin for new Afib. Pt received a 40 mg dose of subcutaneous Lovenox around 1800 on 1/31. Pt [facility] 4 days ago to [facility] d/t acute stroke, primary event >72 hours ago. Neuro to see. Will avoid bolus and aim for lower end of heparin level range d/t recent stroke. Troponins up yesterday and found to be in Afib. CBC wnl, SCr 1.76.  Goal of Therapy:  Heparin level 0.3-0.5 units/ml Monitor platelets by anticoagulation protocol: Yes   Plan:  Start heparin infusion at 1250 units/hr Check anti-Xa level in 8 hours and daily while on heparin Continue to monitor H&H and platelets   Thank you for allowing [place] to participate in this patients care.  Jens Som, PharmD Clinical phone for 09/13/2016 from 7a-3:30p: [phone] If after 3:30p, please call main pharmacy at: [phone] 09/13/2016 8:35 AM

## 2016-09-13 NOTE — Progress Notes (Signed)
Pt back to floor from echo

## 2016-09-13 NOTE — Progress Notes (Signed)
On two separate occasion, HR jumps to 120s-130s when up using urinal. Pt asymptomatic, no sign of acute distress noted.

## 2016-09-13 NOTE — Consult Note (Signed)
Stroke Neurology Consultation Note  Consult Requested by: Dr. Oneida Alar  Reason for Consult: symptomatic left ICA stenosis with hypotension  Consult Date: 09/13/16  The history was obtained from the pt.    History of Present Illness:  Ronald Colon is a 83 y.o. Caucasian male with PMH of HTN, HLD, gout was admitted at [facility] on 09/10/16 for right UE weakness, numbness, right facial droop, expressive aphasia. Symptoms gradually resolved. However, stroke work up showed left MCA and MCA/ACA, MCA/PCA watershed infarcts. MRA showed left ICA/MCA decreased flowing comparing with right. CUS showed left ICA near occlusion and right ICA < 50%. He was advised to transfer to [facility] but he declined. He was asked to follow up urgently with Dr. Oneida Alar at [facility] yesterday and was found to have low BP at 80s and he actually fell at parking lot due to lightheadedness. He was admitted to [facility] for BP management and planned left CEA Monday. Overnight, he was found to have afib with RVR and cardizem started as well as IV heparin.   LSN: 09/09/16 tPA Given: No: out of window  Past Medical History:  Diagnosis Date  . Arthritis    "RLE; lower back" (09/12/2016)  . Carotid stenosis   . Colon polyp   . Diverticulosis   . Essential hypertension, benign   . Gout   . Heart murmur    "I've had it since age 43 when I had rheumatic fever"  . Hyperlipidemia   . Rheumatic fever 1937  . Stroke ([place]) 09/10/2016   "pain/numbness in RUE; some problems saying the right word since" (09/12/2016)    Past Surgical History:  Procedure Laterality Date  . CATARACT EXTRACTION W/PHACO Right 08/02/2014   Procedure: CATARACT EXTRACTION PHACO AND INTRAOCULAR LENS PLACEMENT; CDE:  12.77;  Surgeon: Williams Che, MD;  Location: [facility] ORS;  Service: Ophthalmology;  Laterality: Right;  . CATARACT EXTRACTION W/PHACO Left 02/15/2015   Procedure: CATARACT EXTRACTION PHACO AND INTRAOCULAR LENS PLACEMENT (IOC);  Surgeon: Williams Che, MD;   Location: [facility] ORS;  Service: Ophthalmology;  Laterality: Left;  CDE 18.00  . COLONOSCOPY    . ELBOW SURGERY Right    "grissle in there"  . ESOPHAGEAL DILATION  09/03/2013   Procedure: ESOPHAGEAL DILATION;  Surgeon: Rogene Houston, MD;  Location: [facility] ENDO SUITE;  Service: Endoscopy;;  . ESOPHAGOGASTRODUODENOSCOPY N/A 09/03/2013   Procedure: ESOPHAGOGASTRODUODENOSCOPY (EGD);  Surgeon: Rogene Houston, MD;  Location: [facility] ENDO SUITE;  Service: Endoscopy;  Laterality: N/A;  255-rescheduled to 1030 Ann to notify pt    Family History  Problem Relation Age of Onset  . CVA Mother 32  . Pneumonia Father 23  . Colon cancer Neg Hx     Social History:  reports that he quit smoking about 38 years ago. His smoking use included Cigarettes. He has a 20.00 pack-year smoking history. He has never used smokeless tobacco. He reports that he does not drink alcohol or use drugs.  Allergies: No Known Allergies  No current facility-administered medications on file prior to encounter.    Current Outpatient Prescriptions on File Prior to Encounter  Medication Sig Dispense Refill  . allopurinol (ZYLOPRIM) 300 MG tablet Take 300 mg by mouth daily.    Marland Kitchen amLODipine-olmesartan (AZOR) 5-40 MG per tablet Take 1 tablet by mouth daily.      Marland Kitchen aspirin EC 81 MG tablet Take 1 tablet (81 mg total) by mouth daily.    Marland Kitchen ezetimibe (ZETIA) 10 MG tablet Take 10 mg  by mouth daily.    . fish oil-omega-3 fatty acids 1000 MG capsule Take 1 g by mouth daily.    . Aspirin-Salicylamide-Caffeine (BC HEADACHE) 325-95-16 MG TABS Take 1 packet by mouth daily as needed (for pain).      Review of Systems: A full ROS was attempted today and was able to be performed.  Systems assessed include - Constitutional, Eyes, HENT, Respiratory, Cardiovascular, Gastrointestinal, Genitourinary, Integument/breast, Hematologic/lymphatic, Musculoskeletal, Neurological, Behavioral/Psych, Endocrine,  Allergic/Immunologic - with pertinent responses as per  HPI.  Physical Examination: Temp:  [97 F (36.1 C)-98 F (36.7 C)] 97.8 F (36.6 C) (02/01 1005) Pulse Rate:  [71-120] 117 (02/01 1005) Resp:  [18-20] 18 (02/01 1005) BP: (88-136)/(45-105) 121/79 (02/01 1005) SpO2:  [98 %-100 %] 100 % (02/01 1005) Weight:  [203 lb 9.6 oz (92.4 kg)-222 lb (100.7 kg)] 203 lb 9.6 oz (92.4 kg) (01/31 2017)  General - The patient's general appearance was well nourished, well developed, in no apparent distress.    Ophthalmologic - Sharp disc margins OU.    Cardiovascular - irregularly irregular heart rate and rhythm with RVR.  Mental Status -  Level of arousal and orientation to time, place, and person were intact. Language including expression, naming, repetition, comprehension, reading, and writing was assessed and found intact. Fund of Knowledge was assessed and was intact.  Cranial Nerves II - XII - II - Vision intact OU. III, IV, VI - Extraocular movements intact. V - Facial sensation intact bilaterally. VII - Facial movement intact bilaterally. VIII - Hearing & vestibular intact bilaterally. X - Palate elevates symmetrically. XI - Chin turning & shoulder shrug intact bilaterally. XII - Tongue protrusion intact.  Motor Strength - The patient's strength was normal in all extremities except right hand dexterity difficulty but pt stated that was old and pronator drift was absent.   Motor Tone & Bulk - Muscle tone was assessed at the neck and appendages and was normal.  Bulk was normal and fasciculations were absent.   Reflexes - The patient's reflexes were normal in all extremities and he had no pathological reflexes.  Sensory - Light touch, temperature/pinprick were assessed and were normal.    Coordination - The patient had normal movements in the hands with no ataxia or dysmetria.  Tremor was absent.  Gait and Station - deferred  Data Reviewed: I have personally reviewed the radiological images below and agree with the radiology  interpretations.  Ct Head Wo Contrast 09/10/2016 IMPRESSION: Atrophy with small vessel chronic ischemic changes of deep cerebral white matter. No acute intracranial abnormalities. Electronically Signed   By: Lavonia Dana M.D.   On: 09/10/2016 17:54   Mri and Mra Brain Wo Contrast 09/11/2016  IMPRESSION: 1. Patchy acute infarcts throughout the left cerebral hemisphere which may reflect emboli or hypoperfusion. 2. Mild-to-moderate chronic small vessel ischemic disease. 3. No intracranial large vessel occlusion. 4. 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 The dominant distal left vertebral artery is widely patent. 5. Moderate proximal right P2 stenosis. 6. 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  US Carotid Bilateral (at [facility])  09/11/2016 IMPRESSION: 1. Bilateral carotid bifurcation and proximal ICA plaque, resulting in NEAR OCCLUSIVE STENOSIS of the left ICA, less than 50% diameter stenosis of the right ICA. Recommend vascular surgical or neurointerventional radiology consultation. 2.  Antegrade bilateral vertebral arterial flow.   TTE pending   Assessment: 83 y.o. male with Hx of HTN, HLD, gout was recently discharged from [facility] for  left MCA and MCA/ACA, MCA/PCA watershed infarcts. MRA showed left ICA/MCA decreased flowing comparing with right. CUS showed left ICA near occlusion and right ICA < 50%. He signed AMA. He then saw Dr. Oneida Alar urgently at [facility] yesterday and was found to have symptomatic hypotension and was admitted to [facility] for BP management and planned left CEA Monday. Overnight, he was found to have afib with RVR and cardizem started as well as IV heparin. LDL 113 and A1C 6.8  Plan: - continue heparin IV - no bolus and heparin level 0.3-0.5  - Ok to continue baby ASA  - add lipitor 40mg , continue Zetia  - agree with cardiology consult for afib management - still has afib RVR, recommend cardizem first and try to avoid metoprolol for rate control  if able - BP still at low side, 100-120 range, so far asymptomatic. Recommend bed rest and close monitoring - BP goal 130-150 before CEA, and normotensive after CEA procedure - Echocardiogram ordered - cycling troponin - likely to be demand ischemia - Risk factor modification - Frequent neuro checks - we will continue to follow   Thank you for this consultation and allowing [place] to participate in the care of this patient.  Rosalin Hawking, MD PhD Stroke Neurology 09/13/2016 11:13 AM

## 2016-09-14 DIAGNOSIS — E785 Hyperlipidemia, unspecified: Secondary | ICD-10-CM

## 2016-09-14 LAB — BASIC METABOLIC PANEL
Anion gap: 7 (ref 5–15)
BUN: 34 mg/dL — AB (ref 6–20)
CHLORIDE: 109 mmol/L (ref 101–111)
CO2: 23 mmol/L (ref 22–32)
CREATININE: 1.55 mg/dL — AB (ref 0.61–1.24)
Calcium: 9.1 mg/dL (ref 8.9–10.3)
GFR calc Af Amer: 45 mL/min — ABNORMAL LOW (ref >60)
GFR calc non Af Amer: 39 mL/min — ABNORMAL LOW (ref >60)
GLUCOSE: 148 mg/dL — AB (ref 65–99)
POTASSIUM: 4.7 mmol/L (ref 3.5–5.1)
Sodium: 139 mmol/L (ref 135–145)

## 2016-09-14 LAB — HEPARIN LEVEL (UNFRACTIONATED)
HEPARIN UNFRACTIONATED: 1.26 [IU]/mL — AB (ref 0.30–0.70)
Heparin Unfractionated: 0.63 IU/mL (ref 0.30–0.70)
Heparin Unfractionated: 0.36 IU/mL (ref 0.30–0.70)

## 2016-09-14 LAB — CBC
HEMATOCRIT: 40.4 % (ref 39.0–52.0)
Hemoglobin: 13.3 g/dL (ref 13.0–17.0)
MCH: 31.6 pg (ref 26.0–34.0)
MCHC: 32.9 g/dL (ref 30.0–36.0)
MCV: 96 fL (ref 78.0–100.0)
Platelets: 181 10*3/uL (ref 150–400)
RBC: 4.21 MIL/uL — ABNORMAL LOW (ref 4.22–5.81)
RDW: 13.8 % (ref 11.5–15.5)
WBC: 9.7 10*3/uL (ref 4.0–10.5)

## 2016-09-14 LAB — TROPONIN I: Troponin I: 0.09 ng/mL (ref <0.03)

## 2016-09-14 MED ORDER — HEPARIN (PORCINE) IN NACL 100-0.45 UNIT/ML-% IJ SOLN
650.0000 [IU]/h | INTRAMUSCULAR | Status: DC
  Administered 2016-09-16: 550 [IU]/h via INTRAVENOUS
  Filled 2016-09-14: qty 250

## 2016-09-14 MED ORDER — HEPARIN (PORCINE) IN NACL 100-0.45 UNIT/ML-% IJ SOLN
600.0000 [IU]/h | INTRAMUSCULAR | Status: DC
  Administered 2016-09-14: 600 [IU]/h via INTRAVENOUS
  Filled 2016-09-14: qty 250

## 2016-09-14 NOTE — Progress Notes (Signed)
Progress Note  Patient Name: Ronald Colon Date of Encounter: 09/14/2016  Primary Cardiologist: [name]  Subjective   Feeling well. No chest pain, sob or palpitations.   Inpatient Medications    Scheduled Meds: . allopurinol  300 mg Oral Daily  . aspirin EC  81 mg Oral Daily  . atorvastatin  40 mg Oral q1800  . ezetimibe  10 mg Oral Daily  . omega-3 acid ethyl esters  1 g Oral Daily  . pantoprazole  40 mg Oral Daily  . potassium chloride  20-40 mEq Oral Once  . sodium chloride flush  3 mL Intravenous Q12H   Continuous Infusions: . amiodarone 30 mg/hr (09/14/16 0657)  . diltiazem (CARDIZEM) infusion Stopped (09/13/16 0830)  . heparin 600 Units/hr (09/14/16 0657)   PRN Meds: sodium chloride, alum & mag hydroxide-simeth, bisacodyl, guaiFENesin-dextromethorphan, hydrALAZINE, labetalol, metoprolol, ondansetron, phenol, sodium chloride flush, sodium phosphate   Vital Signs    Vitals:   09/13/16 1426 09/13/16 1642 09/13/16 2109 09/14/16 0506  BP: 117/69 101/78 (!) 144/77 (!) 141/89  Pulse: (!) 113 (!) 112 86 (!) 101  Resp: 18 18    Temp: 97.7 F (36.5 C) 97.3 F (36.3 C) 98.3 F (36.8 C) 98.1 F (36.7 C)  TempSrc: Oral Oral  Oral  SpO2: 97% 98% 98% 99%  Weight:    202 lb 14.4 oz (92 kg)    Intake/Output Summary (Last 24 hours) at 09/14/16 0837 Last data filed at 09/13/16 2128  Gross per 24 hour  Intake              483 ml  Output              675 ml  Net             -192 ml   Filed Weights   09/12/16 2017 09/14/16 0506  Weight: 203 lb 9.6 oz (92.4 kg) 202 lb 14.4 oz (92 kg)    Telemetry    afib at rate of 120s - Personally Reviewed  ECG    n/A - Personally Reviewed  Physical Exam   GEN: No acute distress.   Neck: No JVD. Carotid bruit Cardiac: Ir Ir tachycardic , systolic murmurs, rubs, or gallops.  Respiratory: Clear to auscultation bilaterally. GI: Soft, nontender, non-distended  MS: No edema; No deformity. Neuro:  Nonfocal  Psych:  Normal affect   Labs    Chemistry Recent Labs Lab 09/10/16 1626 09/12/16 1649 09/13/16 0350  NA 133* 140 138  K 4.7 5.1 5.0  CL 103 107 110  CO2 20* 21* 24  GLUCOSE 130* 129* 118*  BUN 28* 38* 38*  CREATININE 1.47* 2.16* 1.76*  CALCIUM 9.6 9.8 9.2  PROT  --  6.3*  --   ALBUMIN  --  3.8  --   AST  --  24  --   ALT  --  24  --   ALKPHOS  --  45  --   BILITOT  --  0.8  --   GFRNONAA 42* 26* 34*  GFRAA 48* 30* 39*  ANIONGAP 10 12 4*     Hematology Recent Labs Lab 09/10/16 1626 09/12/16 1649  WBC 14.1* 12.7*  RBC 4.71 4.38  HGB 14.9 14.0  HCT 45.0 41.9  MCV 95.5 95.7  MCH 31.6 32.0  MCHC 33.1 33.4  RDW 13.6 13.6  PLT 236 194    Cardiac Enzymes Recent Labs Lab 09/11/16 0927 09/12/16 1702 09/12/16 2218 09/13/16 0350  TROPONINI 0.06*  0.07* 0.07* 0.08*   No results for input(s): TROPIPOC in the last 168 hours.   BNPNo results for input(s): BNP, PROBNP in the last 168 hours.   DDimer No results for input(s): DDIMER in the last 168 hours.   Radiology    No results found.  Cardiac Studies   Echo 09/13/16 LV EF: 50% -   55%  ------------------------------------------------------------------- Indications:      CVA 83.  ------------------------------------------------------------------- History:   PMH:   Aortic valve disease.  Stroke.  Risk factors: Former tobacco use. Hypertension.  ------------------------------------------------------------------- Study Conclusions  - Left ventricle: The cavity size was normal. Wall thickness was   increased in a pattern of mild LVH. There was mild focal basal   hypertrophy of the septum. Systolic function was normal. The   estimated ejection fraction was in the range of 50% to 55%. Wall   motion was normal; there were no regional wall motion   abnormalities. - Aortic valve: Valve mobility was restricted. There was mild   stenosis. There was trivial regurgitation. - Mitral valve: Calcified  annulus.  Impressions:  - Normal LV systolic function; calcified aortic valve with mild AS   (mean gradient 11 mmHg); trace AI.  Patient Profile     83 y/o man  with hypertension, hyperlipidemia, gout, chronic osteoarthritis, and new left CVA identified 3 days ago at [facility] (L MCA watershed CVA with severe L ICA stenosis) who was admitted to [facility] from Vascular Surgery clinic where he fell with symptomatic hypotension and subsequently developed Afib with RVR that was observed since admission. Cardiology was consulted for evaluation and management of his Afib with RVR  Assessment & Plan    1. Afib with RVR - TTE showed LVEF 50-55%, normal left atrial size, no significant mitral regurgitation. Hold cardizem drip due to hypotension. Now on IV amiodarone. Rate was stable overnight to 90s however up to 130s this morning with ambulation. Continue heparin for anticoagulation. CHADSVASCc score of 6.   2. Stoke  - per neurology likely 2nd to severe carotid stenosis. Endarterectomy planned Monday.   3. Elevated troponin - flat trend. Likely 2nd to elevated rate.   4. AKI - improving Scr.   Signed, Leanor Kail, PA  09/14/2016, 8:37 AM     The patient was seen, examined and discussed with Bhagat,Bhavinkumar PA-C and I agree with the above.   83 y/o man  with hypertension, hyperlipidemia, gout, chronic osteoarthritis, and new left CVA identified 3 days ago at [facility] (L MCA watershed CVA with severe L ICA stenosis) who was admitted to [facility] from Vascular Surgery clinic where he fell with symptomatic hypotension and subsequently developed Afib with RVR that was observed since admission. Cardiology was consulted for evaluation and management of his Afib with RVR. His TTE showed LVEF 50-55%, normal left atrial size, no significant mitral regurgitation. He was hypotensive on diltiazem drip that was held. We will start amiodarone drip, since his a-fib is  less than 48  Hours and based on his anatomy this might be his first presentation. Neurology believes that stroke sec to severe carotid stenosis. On Heparin drip for anticoagulation.  He cardioverted spontaneously to SR yesterday then went back to a-fib with RVR at 4 am and back to SR at 8 am today. Continue iv amiodarone till tomorrow, if he remains in SR, can switch to PO amiodarone in the am 400 mg PO BID.  Continue full anticoagulation.  Endarterectomy planned for Monday by Dr Oneida Alar.  Minimal troponin elevation with flat trend sec to stroke and a-fib with RVR.   Ena Dawley, MD 09/14/2016

## 2016-09-14 NOTE — Progress Notes (Signed)
STROKE TEAM PROGRESS NOTE   SUBJECTIVE (INTERVAL HISTORY) His nieces are at the bedside.  Overall he feels his condition is stable. No recurrent symptoms. BP under control. Plan for CEA Monday. Still on heparin drip. Was on cardizem drip but developed low BP. Now on amiodarone.    OBJECTIVE Temp:  [97.8 F (36.6 C)-98.1 F (36.7 C)] 97.8 F (36.6 C) (02/02 2000) Pulse Rate:  [80-101] 83 (02/02 2000) Cardiac Rhythm: Atrial fibrillation (02/02 2136) Resp:  [18] 18 (02/02 2000) BP: (124-141)/(55-89) 126/68 (02/02 2000) SpO2:  [98 %-100 %] 100 % (02/02 2000) Weight:  [202 lb 14.4 oz (92 kg)] 202 lb 14.4 oz (92 kg) (02/02 0506)   Recent Labs Lab 09/10/16 1645  GLUCAP 120*    Recent Labs Lab 09/10/16 1626 09/12/16 1649 09/13/16 0350 09/14/16 0925  NA 133* 140 138 139  K 4.7 5.1 5.0 4.7  CL 103 107 110 109  CO2 20* 21* 24 23  GLUCOSE 130* 129* 118* 148*  BUN 28* 38* 38* 34*  CREATININE 1.47* 2.16* 1.76* 1.55*  CALCIUM 9.6 9.8 9.2 9.1    Recent Labs Lab 09/12/16 1649  AST 24  ALT 24  ALKPHOS 45  BILITOT 0.8  PROT 6.3*  ALBUMIN 3.8    Recent Labs Lab 09/10/16 1626 09/12/16 1649 09/14/16 0925  WBC 14.1* 12.7* 9.7  HGB 14.9 14.0 13.3  HCT 45.0 41.9 40.4  MCV 95.5 95.7 96.0  PLT 236 194 181    Recent Labs Lab 09/11/16 0927 09/12/16 1702 09/12/16 2218 09/13/16 0350 09/14/16 0925  TROPONINI 0.06* 0.07* 0.07* 0.08* 0.09*    Recent Labs  09/12/16 1649  LABPROT 13.2  INR 1.00    Recent Labs  09/12/16 1617  COLORURINE AMBER*  LABSPEC 1.023  PHURINE 5.0  GLUCOSEU NEGATIVE  HGBUR NEGATIVE  BILIRUBINUR NEGATIVE  KETONESUR 5*  PROTEINUR 100*  NITRITE NEGATIVE  LEUKOCYTESUR NEGATIVE       Component Value Date/Time   CHOL 178 09/11/2016 0252   TRIG 139 09/11/2016 0252   HDL 37 (L) 09/11/2016 0252   CHOLHDL 4.8 09/11/2016 0252   VLDL 28 09/11/2016 0252   LDLCALC 113 (H) 09/11/2016 0252   Lab Results  Component Value Date   HGBA1C 6.8  (H) 09/11/2016      Component Value Date/Time   LABOPIA NONE DETECTED 09/10/2016 2059   COCAINSCRNUR NONE DETECTED 09/10/2016 2059   LABBENZ NONE DETECTED 09/10/2016 2059   AMPHETMU NONE DETECTED 09/10/2016 2059   THCU NONE DETECTED 09/10/2016 2059   LABBARB NONE DETECTED 09/10/2016 2059    No results for input(s): ETH in the last 168 hours.  I have personally reviewed the radiological images below and agree with the radiology interpretations.  Ct Head Wo Contrast 09/10/2016 IMPRESSION: Atrophy with small vessel chronic ischemic changes of deep cerebral white matter. No acute intracranial abnormalities.    Mri and Mra Brain Wo Contrast 09/11/2016 IMPRESSION: 1. Patchy acute infarcts throughout the left cerebral hemisphere which may reflect emboli or hypoperfusion. 2. Mild-to-moderate chronic small vessel ischemic disease. 3. No intracranial large vessel occlusion. 4. 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 The dominant distal left vertebral artery is widely patent. 5. Moderate proximal right P2 stenosis. 6. 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    US Carotid Bilateral (at [facility]) 09/11/2016 IMPRESSION: 1. Bilateral carotid bifurcation and proximal ICA plaque, resulting in NEAR OCCLUSIVE STENOSIS of the left ICA, less than 50% diameter stenosis of the right ICA.  Recommend vascular surgical or neurointerventional radiology consultation. 2.  Antegrade bilateral vertebral arterial flow.    2D Echocardiogram   Left ventricle: The cavity size was normal. Wall thickness was   increased in a pattern of mild LVH. There was mild focal basal   hypertrophy of the septum. Systolic function was normal. The   estimated ejection fraction was in the range of 50% to 55%. Wall   motion was normal; there were no regional wall motion   abnormalities. - Aortic valve: Valve mobility was restricted. There was mild   stenosis. There was trivial regurgitation. - Mitral valve:  Calcified annulus. Impressions: - Normal LV systolic function; calcified aortic valve with mild AS   (mean gradient 11 mmHg); trace AI.   PHYSICAL EXAM  Temp:  [97.8 F (36.6 C)-98.1 F (36.7 C)] 97.8 F (36.6 C) (02/02 2000) Pulse Rate:  [80-101] 83 (02/02 2000) Resp:  [18] 18 (02/02 2000) BP: (124-141)/(55-89) 126/68 (02/02 2000) SpO2:  [98 %-100 %] 100 % (02/02 2000) Weight:  [202 lb 14.4 oz (92 kg)] 202 lb 14.4 oz (92 kg) (02/02 0506)  General - The patient's general appearance was well nourished, well developed, in no apparent distress.    Ophthalmologic - Sharp disc margins OU.    Cardiovascular - irregularly irregular heart rate and rhythm with RVR.  Mental Status -  Level of arousal and orientation to time, place, and person were intact. Language including expression, naming, repetition, comprehension, reading, and writing was assessed and found intact. Fund of Knowledge was assessed and was intact.  Cranial Nerves II - XII - II - Vision intact OU. III, IV, VI - Extraocular movements intact. V - Facial sensation intact bilaterally. VII - Facial movement intact bilaterally. VIII - Hearing & vestibular intact bilaterally. X - Palate elevates symmetrically. XI - Chin turning & shoulder shrug intact bilaterally. XII - Tongue protrusion intact.  Motor Strength - The patient's strength was normal in all extremities except right hand dexterity difficulty but pt stated that was old and pronator drift was absent.   Motor Tone & Bulk - Muscle tone was assessed at the neck and appendages and was normal.  Bulk was normal and fasciculations were absent.   Reflexes - The patient's reflexes were normal in all extremities and he had no pathological reflexes.  Sensory - Light touch, temperature/pinprick were assessed and were normal.    Coordination - The patient had normal movements in the hands with no ataxia or dysmetria.  Tremor was absent.  Gait and Station -  deferred   ASSESSMENT/PLAN Mr. MAKIH STEFANKO is a 83 y.o. male with history of HTN, HLD, gout was recently discharged from [facility] for left MCA and MCA/ACA, MCA/PCA watershed infarcts. MRA showed left ICA/MCA decreased flowing comparing with right. CUS showed left ICA near occlusion and right ICA < 50%. He signed AMA. He then saw Dr. Oneida Alar urgently at [facility] clinic yesterday and was found to have symptomatic hypotension and was admitted to [facility] for BP management and planned left CEA Monday. During admission, he was found to have afib with RVR and cardizem started as well as IV heparin.     Stroke:  Left MCA, MCA/ACA, MCA/PCA watershed infarcts - consistent with left ICA near occlusion  MRI  Left watershed territory infarcts  MRA  right P2 stenosis, 4 x 3 mm proximal left MCA aneurysm.  Carotid Doppler  Left ICA near occlusion and right ICA < 50% stenosis  2D Echo  EF 50-55%  LDL 113  HgbA1c 6.8  Heparin IV for VTE prophylaxis  Diet Heart Room service appropriate? Yes; Fluid consistency: Thin   aspirin 325 mg daily prior to admission, now on aspirin 81 mg daily and heparin IV  Patient counseled to be compliant with his antithrombotic medications  Ongoing aggressive stroke risk factor management  Therapy recommendations:  Pending   Disposition:  Pending  Carotid stenosis  Left carotid near occlusion on CUS  [facility] on board  Plan CEA on Monday  Avoid hypotension  afib with RVR  New diagnosis  Cardiology on board  Initially on cardizem for rate control, now on amiodarone due to low BP on cardizem  Continue heparin IV  Diabetes  HgbA1c 6.8 goal < 7.0  Controlled  SSI  Hypertension  Home meds:   azor Permissive hypertension (OK if <220/120) for 24-48 hours post stroke and then gradually normalized within 5-7 days. Currently on no BP meds  Stable  Avoid hypotension  Hyperlipidemia  Home meds:  zetia   Currently on zetia and lipitor  LDL 113, goal <  70  on lipitor this admission  Continue statin at discharge  Other Stroke Risk Factors  Advanced age  Other Active Problems  CKD stage II   Hospital day # 2   Rosalin Hawking, MD PhD Stroke Neurology 09/14/2016 11:03 PM    To contact Stroke Continuity provider, please refer to [email or web address] After hours, contact General Neurology

## 2016-09-14 NOTE — Progress Notes (Addendum)
  [facility] Progress Note  Subjective   Feels good today. Still having some right shoulder pain and tingling in right hand. Says speech feels back to normal. Wants to get out of bed. Had BM yesterday. Denies CP or SOB.   Objective Vitals:   09/13/16 2109 09/14/16 0506  BP: (!) 144/77 (!) 141/89  Pulse: 86 (!) 101  Resp:    Temp: 98.3 F (36.8 C) 98.1 F (36.7 C)    Intake/Output Summary (Last 24 hours) at 09/14/16 0757 Last data filed at 09/13/16 2128  Gross per 24 hour  Intake              483 ml  Output              675 ml  Net             -192 ml   5/5 strength upper and lower extremities bilaterally.  No slurred speech or aphasia  Assessment/Planning: 83 y.o. male with acute CVA  -Left MCA CVA: speech and right hand clumsiness continue to improve. Plan for left CEA Monday with Dr. Donzetta Matters. Appreciate neurology following.  -Afib rate stable this am in 90s and 100s. Troponins have been stable. On amio gtt and heparin. BP 140s this am. Appreciate cardiology assistance. -ECHO showing LVEF 50-55%, normal left atrial size and no significant MR -Constipation resolved. -Elevated creatinine. Morning labs pending.  -Get OOB into chair this am.   Alvia Grove 09/14/2016 7:57 AM --  Laboratory CBC    Component Value Date/Time   WBC 12.7 (H) 09/12/2016 1649   HGB 14.0 09/12/2016 1649   HCT 41.9 09/12/2016 1649   PLT 194 09/12/2016 1649    BMET    Component Value Date/Time   NA 138 09/13/2016 0350   K 5.0 09/13/2016 0350   CL 110 09/13/2016 0350   CO2 24 09/13/2016 0350   GLUCOSE 118 (H) 09/13/2016 0350   BUN 38 (H) 09/13/2016 0350   CREATININE 1.76 (H) 09/13/2016 0350   CALCIUM 9.2 09/13/2016 0350   GFRNONAA 34 (L) 09/13/2016 0350   GFRAA 39 (L) 09/13/2016 0350    COAG Lab Results  Component Value Date   INR 1.00 09/12/2016   No results found for: PTT  Antibiotics Anti-infectives    None       Virgina Jock, PA-C [facility] Office: [phone] Pager: [phone] 09/14/2016 7:57 AM   I have independently interviewed patient and agree with PA assessment and plan above. Discussed risks and benefits of Left CEA with patient and will plan to proceed on Monday as long no other contraindications.   Tanysha Quant C. Donzetta Matters, MD [facility] [place] Office: [phone] Pager: [phone]

## 2016-09-14 NOTE — Progress Notes (Signed)
ANTICOAGULATION CONSULT NOTE - Follow up [place] for heparin Indication: atrial fibrillation  Allergies  Allergen Reactions  . No Known Allergies     Patient Measurements: Weight: 202 lb 14.4 oz (92 kg) Heparin Dosing Weight: 92.4 kg  Vital Signs: Temp: 98 F (36.7 C) (02/02 1400) Temp Source: Oral (02/02 1400) BP: 124/55 (02/02 1400) Pulse Rate: 80 (02/02 1400)  Labs:  Recent Labs  09/12/16 1649  09/12/16 2218 09/13/16 0350 09/13/16 1825 09/14/16 0246 09/14/16 0925 09/14/16 1357  HGB 14.0  --   --   --   --   --  13.3  --   HCT 41.9  --   --   --   --   --  40.4  --   PLT 194  --   --   --   --   --  181  --   LABPROT 13.2  --   --   --   --   --   --   --   INR 1.00  --   --   --   --   --   --   --   HEPARINUNFRC  --   --   --   --  0.93* 1.26*  --  0.63  CREATININE 2.16*  --   --  1.76*  --   --  1.55*  --   TROPONINI  --   < > 0.07* 0.08*  --   --  0.09*  --   < > = values in this interval not displayed.  Estimated Creatinine Clearance: 40.5 mL/min (by C-G formula based on SCr of 1.55 mg/dL (H)).  Assessment: Ronald Colon is a 83 y.o. male, pharmacy consulted to dose heparin for new Afib. Pt received a 40 mg dose of subcutaneous Lovenox around 1800 on 1/31. Pt admitted 4 days ago to [facility] d/t acute stroke, primary event >72 hours ago. Will avoid bolus and aim for lower end of heparin level range d/t recent stroke. Troponins up yesterday and found to be in Afib. CBC wnl, SCr 1.55. No bleeding noted.  Heparin level remains higher than goal range at 0.63.  Goal of Therapy:  Heparin level 0.3-0.5 units/ml Monitor platelets by anticoagulation protocol: Yes   Plan:  Decrease heparin to 450 units/hr Check anti-Xa level in 8 hours and daily while on heparin Continue to monitor H&H and platelets   Thank you for allowing [place] to participate in this patients care.  Jens Som, PharmD Clinical phone for 09/14/2016 from 7a-3:00p: [phone] If  after 3:00p, please call main pharmacy at: [phone] 09/14/2016 2:39 PM

## 2016-09-14 NOTE — Progress Notes (Signed)
ANTICOAGULATION CONSULT NOTE - Follow Up Consult  Pharmacy Consult for heparin Indication: atrial fibrillation and stroke  Labs:  Recent Labs  09/12/16 1649 09/12/16 1702 09/12/16 2218 09/13/16 0350 09/13/16 1825 09/14/16 0246  HGB 14.0  --   --   --   --   --   HCT 41.9  --   --   --   --   --   PLT 194  --   --   --   --   --   LABPROT 13.2  --   --   --   --   --   INR 1.00  --   --   --   --   --   HEPARINUNFRC  --   --   --   --  0.93* 1.26*  CREATININE 2.16*  --   --  1.76*  --   --   TROPONINI  --  0.07* 0.07* 0.08*  --   --     Assessment: 83yo male significantly above goal on heparin with increase in level despite decrease in rate last pm; lab drawn correctly in opposite arm from heparin infusion.  Goal of Therapy:  Heparin level 0.3-0.5 units/ml   Plan:  Will hold heparin gtt x1hr then resume heparin gtt at lower rate of 600 units/hr and check level in 6hr to ensure level decreasing.  Wynona Neat, PharmD, BCPS  09/14/2016,5:39 AM

## 2016-09-14 NOTE — Progress Notes (Signed)
[facility] [place]  Subjective  - feels better   Objective (!) 141/89 (!) 101 98.1 F (36.7 C) (Oral) 18 99%  Intake/Output Summary (Last 24 hours) at 09/14/16 0926 Last data filed at 09/13/16 2128  Gross per 24 hour  Intake              243 ml  Output              375 ml  Net             -132 ml   Neuro near baseline pre stroke  Assessment/Planning: afib rate controlled BP titration in progress CEA Monday  Ronald Colon 09/14/2016 9:26 AM --  Laboratory Lab Results:  Recent Labs  09/12/16 1649  WBC 12.7*  HGB 14.0  HCT 41.9  PLT 194   BMET  Recent Labs  09/12/16 1649 09/13/16 0350  NA 140 138  K 5.1 5.0  CL 107 110  CO2 21* 24  GLUCOSE 129* 118*  BUN 38* 38*  CREATININE 2.16* 1.76*  CALCIUM 9.8 9.2    COAG Lab Results  Component Value Date   INR 1.00 09/12/2016   No results found for: PTT

## 2016-09-14 NOTE — Progress Notes (Signed)
ANTICOAGULATION CONSULT NOTE - Follow Up Consult  Pharmacy Consult for Heparin  Indication: atrial fibrillation and stroke  Allergies  Allergen Reactions  . No Known Allergies     Patient Measurements: Weight: 202 lb 14.4 oz (92 kg)  Vital Signs: Temp: 97.8 F (36.6 C) (02/02 2000) Temp Source: Oral (02/02 2000) BP: 126/68 (02/02 2000) Pulse Rate: 83 (02/02 2000)  Labs:  Recent Labs  09/12/16 1649  09/12/16 2218 09/13/16 0350  09/14/16 0246 09/14/16 0925 09/14/16 1357 09/14/16 2205  HGB 14.0  --   --   --   --   --  13.3  --   --   HCT 41.9  --   --   --   --   --  40.4  --   --   PLT 194  --   --   --   --   --  181  --   --   LABPROT 13.2  --   --   --   --   --   --   --   --   INR 1.00  --   --   --   --   --   --   --   --   HEPARINUNFRC  --   --   --   --   < > 1.26*  --  0.63 0.36  CREATININE 2.16*  --   --  1.76*  --   --  1.55*  --   --   TROPONINI  --   < > 0.07* 0.08*  --   --  0.09*  --   --   < > = values in this interval not displayed.  Estimated Creatinine Clearance: 40.5 mL/min (by C-G formula based on SCr of 1.55 mg/dL (H)).   Assessment: Heparin level therapeutic x 1 after rate decrease  Goal of Therapy:  Heparin level 0.3-0.5 units/ml Monitor platelets by anticoagulation protocol: Yes   Plan:  -Cont heparin 450 units/hr -Confirmatory HL with AM labs  [place], [place] 09/14/2016,11:59 PM

## 2016-09-15 DIAGNOSIS — I6320 Cerebral infarction due to unspecified occlusion or stenosis of unspecified precerebral arteries: Secondary | ICD-10-CM

## 2016-09-15 LAB — HEPARIN LEVEL (UNFRACTIONATED): HEPARIN UNFRACTIONATED: 0.41 [IU]/mL (ref 0.30–0.70)

## 2016-09-15 LAB — CBC
HEMATOCRIT: 38.2 % — AB (ref 39.0–52.0)
HEMOGLOBIN: 12.5 g/dL — AB (ref 13.0–17.0)
MCH: 31.1 pg (ref 26.0–34.0)
MCHC: 32.7 g/dL (ref 30.0–36.0)
MCV: 95 fL (ref 78.0–100.0)
Platelets: 174 10*3/uL (ref 150–400)
RBC: 4.02 MIL/uL — AB (ref 4.22–5.81)
RDW: 13.5 % (ref 11.5–15.5)
WBC: 9.6 10*3/uL (ref 4.0–10.5)

## 2016-09-15 NOTE — Progress Notes (Signed)
ANTICOAGULATION CONSULT NOTE - Follow Up Consult  Pharmacy Consult for Heparin Indication: atrial fibrillation and stroke  Allergies  Allergen Reactions  . No Known Allergies     Patient Measurements: Height: 6\' 2"  (188 cm) Weight: 207 lb 12.8 oz (94.3 kg) IBW/kg (Calculated) : 82.2 Heparin Dosing Weight: 94.3 kg  Vital Signs:    Labs:  Recent Labs  09/12/16 1649  09/12/16 2218 09/13/16 0350  09/14/16 0925 09/14/16 1357 09/14/16 2205 09/15/16 0438  HGB 14.0  --   --   --   --  13.3  --   --  12.5*  HCT 41.9  --   --   --   --  40.4  --   --  38.2*  PLT 194  --   --   --   --  181  --   --  174  LABPROT 13.2  --   --   --   --   --   --   --   --   INR 1.00  --   --   --   --   --   --   --   --   HEPARINUNFRC  --   --   --   --   < >  --  0.63 0.36 0.41  CREATININE 2.16*  --   --  1.76*  --  1.55*  --   --   --   TROPONINI  --   < > 0.07* 0.08*  --  0.09*  --   --   --   < > = values in this interval not displayed.  Estimated Creatinine Clearance: 40.5 mL/min (by C-G formula based on SCr of 1.55 mg/dL (H)).  Assessment:   83 yr old male continues on IV heparin for afib and recent stroke.   Heparin level remains therapeutic (0.41) on 450 units/hr.   Targeting lower end of therapeutic heparin levels due to recent stroke.   Planning CEA on 09/17/16.  Goal of Therapy:  Heparin level 0.3-0.5 units/ml Monitor platelets by anticoagulation protocol: Yes   Plan:   Continue heparin drip at 450 units/hr.  Daily heparin level and CBC.  Will follow up for peri-operative heparin plans.  Arty Baumgartner, [place] Pager: [phone] 09/15/2016,11:35 AM

## 2016-09-15 NOTE — Progress Notes (Signed)
Patient ID: Ronald Colon, male   DOB: [birth year], 83 y.o.   MRN: [id number] Patient is comfortable this morning no change in his neurologic status. Discussed plan for left carotid endarterectomy with Dr. Donzetta Matters on Monday at 0 7:30

## 2016-09-15 NOTE — Progress Notes (Signed)
.hcro   Progress Note  Patient Name: Ronald Colon Date of Encounter: 09/15/2016  Primary Cardiologist: [name]  Subjective   Feeling well. No chest pain, sob or palpitations.   Inpatient Medications    Scheduled Meds: . allopurinol  300 mg Oral Daily  . aspirin EC  81 mg Oral Daily  . atorvastatin  40 mg Oral q1800  . ezetimibe  10 mg Oral Daily  . omega-3 acid ethyl esters  1 g Oral Daily  . pantoprazole  40 mg Oral Daily  . potassium chloride  20-40 mEq Oral Once  . sodium chloride flush  3 mL Intravenous Q12H   Continuous Infusions: . amiodarone 30 mg/hr (09/15/16 0744)  . diltiazem (CARDIZEM) infusion Stopped (09/13/16 0830)  . heparin 450 Units/hr (09/14/16 1453)   PRN Meds: sodium chloride, alum & mag hydroxide-simeth, bisacodyl, guaiFENesin-dextromethorphan, hydrALAZINE, labetalol, metoprolol, ondansetron, phenol, sodium chloride flush, sodium phosphate   Vital Signs    Vitals:   09/14/16 0506 09/14/16 1400 09/14/16 2000 09/15/16 0622  BP: (!) 141/89 (!) 124/55 126/68   Pulse: (!) 101 80 83   Resp:  18 18   Temp: 98.1 F (36.7 C) 98 F (36.7 C) 97.8 F (36.6 C)   TempSrc: Oral Oral Oral   SpO2: 99% 98% 100%   Weight: 202 lb 14.4 oz (92 kg)   207 lb 12.8 oz (94.3 kg)    Intake/Output Summary (Last 24 hours) at 09/15/16 1030 Last data filed at 09/15/16 0700  Gross per 24 hour  Intake          1394.06 ml  Output             1000 ml  Net           394.06 ml   Filed Weights   09/12/16 2017 09/14/16 0506 09/15/16 0622  Weight: 203 lb 9.6 oz (92.4 kg) 202 lb 14.4 oz (92 kg) 207 lb 12.8 oz (94.3 kg)    Telemetry    afib at rate of 120s since last night  - Personally Reviewed  ECG    n/A - Personally Reviewed  Physical Exam   GEN: No acute distress.   Neck: No JVD. Carotid bruit Cardiac: Ir Ir tachycardic , systolic murmurs, rubs, or gallops.  Respiratory: Clear to auscultation bilaterally. GI: Soft, nontender, non-distended  MS: No  edema; No deformity. Neuro:  Nonfocal  Psych: Normal affect   Labs    Chemistry  Recent Labs Lab 09/12/16 1649 09/13/16 0350 09/14/16 0925  NA 140 138 139  K 5.1 5.0 4.7  CL 107 110 109  CO2 21* 24 23  GLUCOSE 129* 118* 148*  BUN 38* 38* 34*  CREATININE 2.16* 1.76* 1.55*  CALCIUM 9.8 9.2 9.1  PROT 6.3*  --   --   ALBUMIN 3.8  --   --   AST 24  --   --   ALT 24  --   --   ALKPHOS 45  --   --   BILITOT 0.8  --   --   GFRNONAA 26* 34* 39*  GFRAA 30* 39* 45*  ANIONGAP 12 4* 7     Hematology  Recent Labs Lab 09/12/16 1649 09/14/16 0925 09/15/16 0438  WBC 12.7* 9.7 9.6  RBC 4.38 4.21* 4.02*  HGB 14.0 13.3 12.5*  HCT 41.9 40.4 38.2*  MCV 95.7 96.0 95.0  MCH 32.0 31.6 31.1  MCHC 33.4 32.9 32.7  RDW 13.6 13.8 13.5  PLT 194 181  174    Cardiac Enzymes  Recent Labs Lab 09/12/16 1702 09/12/16 2218 09/13/16 0350 09/14/16 0925  TROPONINI 0.07* 0.07* 0.08* 0.09*   No results for input(s): TROPIPOC in the last 168 hours.   BNPNo results for input(s): BNP, PROBNP in the last 168 hours.   DDimer No results for input(s): DDIMER in the last 168 hours.   Radiology    No results found.  Cardiac Studies   Echo 09/13/16 LV EF: 50% -   55%  ------------------------------------------------------------------- Indications:      CVA 83.  ------------------------------------------------------------------- History:   PMH:   Aortic valve disease.  Stroke.  Risk factors: Former tobacco use. Hypertension.  ------------------------------------------------------------------- Study Conclusions  - Left ventricle: The cavity size was normal. Wall thickness was   increased in a pattern of mild LVH. There was mild focal basal   hypertrophy of the septum. Systolic function was normal. The   estimated ejection fraction was in the range of 50% to 55%. Wall   motion was normal; there were no regional wall motion   abnormalities. - Aortic valve: Valve mobility was  restricted. There was mild   stenosis. There was trivial regurgitation. - Mitral valve: Calcified annulus.  Impressions:  - Normal LV systolic function; calcified aortic valve with mild AS   (mean gradient 11 mmHg); trace AI.  Patient Profile     83 y/o man  with hypertension, hyperlipidemia, gout, chronic osteoarthritis, and new left CVA identified 3 days ago at [facility] (L MCA watershed CVA with severe L ICA stenosis) who was admitted to [facility] from Vascular Surgery clinic where he fell with symptomatic hypotension and subsequently developed Afib with RVR that was observed since admission. Cardiology was consulted for evaluation and management of his Afib with RVR  Assessment & Plan    1. Afib with RVR - TTE showed LVEF 50-55%, normal left atrial size, no significant mitral regurgitation. Hold cardizem drip due to hypotension.  - Going in and out of afib on IV amiodarone. Currently in Afib at rate of 90s since 9pm last nigth.   Continue heparin for anticoagulation. CHADSVASCc score of 6. Continue IV Amiodarone.   2. [place]  - Per neurology likely 2nd to severe carotid stenosis. CEA planned Monday.   3. Elevated troponin - flat trend. Likely 2nd to elevated rate.   4. AKI - improving Scr.   Signed, Leanor Kail, PA  09/15/2016, 10:30 AM     I have seen and examined the patient along with Bhagat,Bhavinkumar, PA.  I have reviewed the chart, notes and new data.  I agree with PA's note.  Key new complaints: Feels well, still has some paresthesias/weakness in his right hand. Also complains of some discomfort in his right shoulder Key examination changes: He is in atrial fibrillation today with borderline ventricular rate control (low 100s) no sign of congestive heart failure Key new findings / data: In and out of atrial fibrillation repeatedly over the last couple of days. Does not appear to notice the arrhythmia at all.  PLAN: Carotid endarterectomy  Monday. Transition from heparin to a direct oral anticoagulant after surgery. Will switch intravenous amiodarone to by mouth, but since his rate control is still suboptimal we'll keep him on IV amiodarone one more day. I don't think he requires any additional rate control medications for the time being. The full effect of amiodarone is still to "kick in".  Sanda Klein, MD, [place] [phone] 09/15/2016, 11:52 AM

## 2016-09-15 NOTE — Progress Notes (Signed)
STROKE TEAM PROGRESS NOTE   SUBJECTIVE (INTERVAL HISTORY) No family at the bedside.  Overall he feels his condition is stable. No clear recurrent symptoms.   He stated right shoulder was sore and there is numbness in right hand but denies that these are new.  In fact he reports these are chronic along with right hip pain.  Vascular Surgery at bedside.  Confirmed plan for CEA on Monday.  Cardiology team rounded.  Cards plan:  "Carotid endarterectomy Monday. Transition from heparin to a direct oral anticoagulant after surgery. Switching intravenous amiodarone to by mouth, but since his rate control is still suboptimal; thus will keep him on IV amiodarone one more day. No additional rate control medications for the time being. The full effect of amiodarone is still to "kick in"."   BP under control. Plan for CEA Monday. Still on heparin drip. Was on cardizem drip but developed low BP. Now on amiodarone.    OBJECTIVE Temp:  [97.8 F (36.6 C)-98 F (36.7 C)] 97.8 F (36.6 C) (02/02 2000) Pulse Rate:  [80-83] 83 (02/02 2000) Cardiac Rhythm: Atrial fibrillation (02/03 0700) Resp:  [18] 18 (02/02 2000) BP: (124-126)/(55-68) 126/68 (02/02 2000) SpO2:  [98 %-100 %] 100 % (02/02 2000) Weight:  [94.3 kg (207 lb 12.8 oz)] 94.3 kg (207 lb 12.8 oz) (02/03 0622)   Recent Labs Lab 09/10/16 1645  GLUCAP 120*    Recent Labs Lab 09/10/16 1626 09/12/16 1649 09/13/16 0350 09/14/16 0925  NA 133* 140 138 139  K 4.7 5.1 5.0 4.7  CL 103 107 110 109  CO2 20* 21* 24 23  GLUCOSE 130* 129* 118* 148*  BUN 28* 38* 38* 34*  CREATININE 1.47* 2.16* 1.76* 1.55*  CALCIUM 9.6 9.8 9.2 9.1    Recent Labs Lab 09/12/16 1649  AST 24  ALT 24  ALKPHOS 45  BILITOT 0.8  PROT 6.3*  ALBUMIN 3.8    Recent Labs Lab 09/10/16 1626 09/12/16 1649 09/14/16 0925 09/15/16 0438  WBC 14.1* 12.7* 9.7 9.6  HGB 14.9 14.0 13.3 12.5*  HCT 45.0 41.9 40.4 38.2*  MCV 95.5 95.7 96.0 95.0  PLT 236 194 181 174     Recent Labs Lab 09/11/16 0927 09/12/16 1702 09/12/16 2218 09/13/16 0350 09/14/16 0925  TROPONINI 0.06* 0.07* 0.07* 0.08* 0.09*    Recent Labs  09/12/16 1649  LABPROT 13.2  INR 1.00    Recent Labs  09/12/16 1617  COLORURINE AMBER*  LABSPEC 1.023  PHURINE 5.0  GLUCOSEU NEGATIVE  HGBUR NEGATIVE  BILIRUBINUR NEGATIVE  KETONESUR 5*  PROTEINUR 100*  NITRITE NEGATIVE  LEUKOCYTESUR NEGATIVE       Component Value Date/Time   CHOL 178 09/11/2016 0252   TRIG 139 09/11/2016 0252   HDL 37 (L) 09/11/2016 0252   CHOLHDL 4.8 09/11/2016 0252   VLDL 28 09/11/2016 0252   LDLCALC 113 (H) 09/11/2016 0252   Lab Results  Component Value Date   HGBA1C 6.8 (H) 09/11/2016      Component Value Date/Time   LABOPIA NONE DETECTED 09/10/2016 2059   COCAINSCRNUR NONE DETECTED 09/10/2016 2059   LABBENZ NONE DETECTED 09/10/2016 2059   AMPHETMU NONE DETECTED 09/10/2016 2059   THCU NONE DETECTED 09/10/2016 2059   LABBARB NONE DETECTED 09/10/2016 2059    No results for input(s): ETH in the last 168 hours.  I have personally reviewed the radiological images below and agree with the radiology interpretations.  Ct Head Wo Contrast 09/10/2016 IMPRESSION: Atrophy with small vessel chronic ischemic changes of  deep cerebral white matter. No acute intracranial abnormalities.    Mri and Mra Brain Wo Contrast 09/11/2016 IMPRESSION: 1. Patchy acute infarcts throughout the left cerebral hemisphere which may reflect emboli or hypoperfusion. 2. Mild-to-moderate chronic small vessel ischemic disease. 3. No intracranial large vessel occlusion. 4. 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 The dominant distal left vertebral artery is widely patent. 5. Moderate proximal right P2 stenosis. 6. 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    US Carotid Bilateral (at [facility]) 09/11/2016 IMPRESSION: 1. Bilateral carotid bifurcation and proximal ICA plaque, resulting in NEAR OCCLUSIVE  STENOSIS of the left ICA, less than 50% diameter stenosis of the right ICA. Recommend vascular surgical or neurointerventional radiology consultation. 2.  Antegrade bilateral vertebral arterial flow.    2D Echocardiogram   Left ventricle: The cavity size was normal. Wall thickness was   increased in a pattern of mild LVH. There was mild focal basal   hypertrophy of the septum. Systolic function was normal. The   estimated ejection fraction was in the range of 50% to 55%. Wall   motion was normal; there were no regional wall motion   abnormalities. - Aortic valve: Valve mobility was restricted. There was mild   stenosis. There was trivial regurgitation. - Mitral valve: Calcified annulus. Impressions: - Normal LV systolic function; calcified aortic valve with mild AS   (mean gradient 11 mmHg); trace AI.   PHYSICAL EXAM  Temp:  [97.8 F (36.6 C)-98 F (36.7 C)] 97.8 F (36.6 C) (02/02 2000) Pulse Rate:  [80-83] 83 (02/02 2000) Resp:  [18] 18 (02/02 2000) BP: (124-126)/(55-68) 126/68 (02/02 2000) SpO2:  [98 %-100 %] 100 % (02/02 2000) Weight:  [94.3 kg (207 lb 12.8 oz)] 94.3 kg (207 lb 12.8 oz) (02/03 0622)  General - The patient's general appearance was well nourished, well developed, in no apparent distress.    Ophthalmologic - Sharp disc margins OU.    Cardiovascular - irregularly irregular heart rate and rhythm with RVR.  Mental Status -  Level of arousal and orientation to time, place, and person were intact. Language including expression, naming, repetition, comprehension, reading, and writing was assessed and found intact. Fund of Knowledge was assessed and was intact.  Cranial Nerves II - XII - II - Vision intact OU. III, IV, VI - Extraocular movements intact. V - Facial sensation intact bilaterally. VII - Facial movement intact bilaterally. VIII - Hearing & vestibular intact bilaterally. X - Palate elevates symmetrically. XI - Chin turning & shoulder shrug  intact bilaterally. XII - Tongue protrusion intact.  Motor Strength - The patient's strength was normal in all extremities except right hand dexterity difficulty but pt stated that was old and pronator drift was absent.   Motor Tone & Bulk - Muscle tone was assessed at the neck and appendages and was normal.  Bulk was normal and fasciculations were absent.   Reflexes - The patient's reflexes were normal in all extremities and he had no pathological reflexes.  Sensory - Light touch, temperature/pinprick were assessed and were normal.    Coordination - The patient had normal movements in the hands with no ataxia or dysmetria.  Tremor was absent.  Gait and Station - deferred   ASSESSMENT/PLAN Mr. Ronald Colon is a 83 y.o. male with history of HTN, HLD, gout was recently discharged from [facility] for left MCA and MCA/ACA, MCA/PCA watershed infarcts. MRA showed left ICA/MCA decreased flowing comparing with right. CUS showed left  ICA near occlusion and right ICA < 50%. He signed AMA. He then saw Dr. Oneida Alar urgently at [facility] clinic yesterday and was found to have symptomatic hypotension and was admitted to [facility] for BP management and planned left CEA Monday. During admission, he was found to have afib with RVR and cardizem started as well as IV heparin.     Stroke:  Left MCA, MCA/ACA, MCA/PCA watershed infarcts - consistent with left ICA near occlusion  MRI  Left watershed territory infarcts  MRA  right P2 stenosis, 4 x 3 mm proximal left MCA aneurysm.  Carotid Doppler  Left ICA near occlusion and right ICA < 50% stenosis  2D Echo  EF 50-55%  LDL 113  HgbA1c 6.8  Heparin IV for VTE prophylaxis Diet Heart Room service appropriate? Yes; Fluid consistency: Thin  Diet NPO time specified Except for: Sips with Meds   aspirin 325 mg daily prior to admission, now on aspirin 81 mg daily and heparin IV  Patient counseled to be compliant with his antithrombotic medications  Ongoing  aggressive stroke risk factor management  Therapy recommendations:  Pending   Disposition:  Pending  Carotid stenosis  Left carotid near occlusion on CUS  [facility] on board  Plan CEA on Monday  Avoid hypotension  afib with RVR  New diagnosis  Cardiology on board  Initially on cardizem for rate control, now on amiodarone due to low BP on cardizem  Continue heparin IV  Diabetes  HgbA1c 6.8 goal < 7.0  Controlled  SSI  Hypertension  Home meds:   azor Permissive hypertension (OK if <220/120) for 24-48 hours post stroke and then gradually normalized within 5-7 days. Currently on no BP meds  Stable  Avoid hypotension  Hyperlipidemia  Home meds:  zetia   Currently on zetia and lipitor  LDL 113, goal < 70  on lipitor this admission  Continue statin at discharge  Other Stroke Risk Factors  Advanced age  Other Active Problems  CKD stage II   Hospital day # 3  ATTENDING NOTE: Patient was seen and examined by me personally. Documentation reflects findings. The laboratory and radiographic studies reviewed by me. ROS completed by me personally and pertinent positives fully documented Condition: stable  Assessment and plan completed by me personally and fully documented above. Plans/Recommendations include:     Agree with plan for CEA on Monday  Stroke work-up complete and no further recommendations at this time  Please feel free to re-consult if patient experiences changes in neurologic exam or you have additional qestions  SIGNED BY: Dr. Elissa Hefty    To contact Stroke Continuity provider, please refer to [email or web address] After hours, contact General Neurology

## 2016-09-16 DIAGNOSIS — N179 Acute kidney failure, unspecified: Secondary | ICD-10-CM

## 2016-09-16 DIAGNOSIS — I6320 Cerebral infarction due to unspecified occlusion or stenosis of unspecified precerebral arteries: Secondary | ICD-10-CM

## 2016-09-16 LAB — CBC
HCT: 37.4 % — ABNORMAL LOW (ref 39.0–52.0)
Hemoglobin: 12.3 g/dL — ABNORMAL LOW (ref 13.0–17.0)
MCH: 30.9 pg (ref 26.0–34.0)
MCHC: 32.9 g/dL (ref 30.0–36.0)
MCV: 94 fL (ref 78.0–100.0)
PLATELETS: 171 10*3/uL (ref 150–400)
RBC: 3.98 MIL/uL — ABNORMAL LOW (ref 4.22–5.81)
RDW: 13.3 % (ref 11.5–15.5)
WBC: 8.5 10*3/uL (ref 4.0–10.5)

## 2016-09-16 LAB — HEPARIN LEVEL (UNFRACTIONATED)
HEPARIN UNFRACTIONATED: 0.24 [IU]/mL — AB (ref 0.30–0.70)
HEPARIN UNFRACTIONATED: 0.14 [IU]/mL — AB (ref 0.30–0.70)
Heparin Unfractionated: 0.22 IU/mL — ABNORMAL LOW (ref 0.30–0.70)

## 2016-09-16 NOTE — Progress Notes (Signed)
ANTICOAGULATION CONSULT NOTE - Follow Up Consult  Pharmacy Consult for Heparin  Indication: atrial fibrillation and stroke  Allergies  Allergen Reactions  . No Known Allergies     Patient Measurements: Height: 6\' 2"  (188 cm) Weight: 203 lb 0.7 oz (92.1 kg) IBW/kg (Calculated) : 82.2  Vital Signs: Temp: 98.2 F (36.8 C) (02/04 1505) Temp Source: Oral (02/04 1505) BP: 145/79 (02/04 1505) Pulse Rate: 97 (02/04 1505)  Labs:  Recent Labs  09/14/16 0925  09/15/16 0438 09/16/16 0237 09/16/16 1144 09/16/16 1933  HGB 13.3  --  12.5* 12.3*  --   --   HCT 40.4  --  38.2* 37.4*  --   --   PLT 181  --  174 171  --   --   HEPARINUNFRC  --   < > 0.41 0.24* 0.22* 0.14*  CREATININE 1.55*  --   --   --   --   --   TROPONINI 0.09*  --   --   --   --   --   < > = values in this interval not displayed.  Estimated Creatinine Clearance: 40.5 mL/min (by C-G formula based on SCr of 1.55 mg/dL (H)).   Assessment: 83 yr old male continues on IV heparin for afib and recent stroke. Targeting lower end of therapeutic heparin levels due to recent stroke. Planning CEA on 09/17/16 and transition to oral anticoagulation post-op.   Heparin level this evening remains SUBtherapeutic despite a rate increase earlier today (HL 0.14, goal of 0.3-0.5). No bleeding noted at this time.   Goal of Therapy:  Heparin level 0.3-0.5 units/ml Monitor platelets by anticoagulation protocol: Yes   Plan:  1. Increase Heparin to 650 units/hr (6.5 ml/hr) 2. Will continue to monitor for any signs/symptoms of bleeding and will follow up with heparin level in 8 hours   Thank you for allowing pharmacy to be a part of this patient's care.  Alycia Rossetti, PharmD, BCPS Clinical Pharmacist Pager: [phone] 09/16/2016 8:28 PM

## 2016-09-16 NOTE — Progress Notes (Signed)
ANTICOAGULATION CONSULT NOTE - Follow Up Consult  Pharmacy Consult for Heparin  Indication: atrial fibrillation and stroke  Allergies  Allergen Reactions  . No Known Allergies     Patient Measurements: Height: 6\' 2"  (188 cm) Weight: 203 lb 0.7 oz (92.1 kg) IBW/kg (Calculated) : 82.2  Vital Signs: Temp: 97.5 F (36.4 C) (02/04 0506) Temp Source: Oral (02/04 0506) BP: 124/85 (02/04 0506) Pulse Rate: 89 (02/04 0506)  Labs:  Recent Labs  09/14/16 0925  09/15/16 0438 09/16/16 0237 09/16/16 1144  HGB 13.3  --  12.5* 12.3*  --   HCT 40.4  --  38.2* 37.4*  --   PLT 181  --  174 171  --   HEPARINUNFRC  --   < > 0.41 0.24* 0.22*  CREATININE 1.55*  --   --   --   --   TROPONINI 0.09*  --   --   --   --   < > = values in this interval not displayed.  Estimated Creatinine Clearance: 40.5 mL/min (by C-G formula based on SCr of 1.55 mg/dL (H)).   Assessment: 83 yr old male continues on IV heparin for afib and recent stroke. Heparin level subtherapeutic (0.22) after rate increase to 500 units/hr. Targeting lower end of therapeutic heparin levels due to recent stroke. Will increase dose conservatively. CBC stable, no s/s bleeding. Planning CEA on 09/17/16.  Goal of Therapy:  Heparin level 0.3-0.5 units/ml Monitor platelets by anticoagulation protocol: Yes   Plan:  -Increase heparin gtt to 550 units/hr -Heparin level in 8 hours -Daily heparin level and CBC -Monitor for s/s bleeding   Argie Ramming, PharmD Pharmacy Resident  Pager [phone] 09/16/16 12:18 PM

## 2016-09-16 NOTE — Progress Notes (Signed)
Progress Note  Patient Name: Ronald Colon Date of Encounter: 09/16/2016  Primary Cardiologist: Domenic Polite  Subjective   Feels great. Still has right hand weakness and reduced dexterity. Ventricular rate around 100 at rest, occasionally 120 when more excited.  Inpatient Medications    Scheduled Meds: . allopurinol  300 mg Oral Daily  . aspirin EC  81 mg Oral Daily  . atorvastatin  40 mg Oral q1800  . ezetimibe  10 mg Oral Daily  . omega-3 acid ethyl esters  1 g Oral Daily  . pantoprazole  40 mg Oral Daily  . sodium chloride flush  3 mL Intravenous Q12H   Continuous Infusions: . amiodarone 30 mg/hr (09/16/16 0409)  . heparin 550 Units/hr (09/16/16 1221)   PRN Meds: sodium chloride, alum & mag hydroxide-simeth, bisacodyl, guaiFENesin-dextromethorphan, hydrALAZINE, labetalol, metoprolol, ondansetron, phenol, sodium chloride flush, sodium phosphate   Vital Signs    Vitals:   09/15/16 1100 09/15/16 1500 09/15/16 2100 09/16/16 0506  BP:  121/79 131/71 124/85  Pulse:  98 97 89  Resp:  18 19 20   Temp:  99 F (37.2 C) 98.2 F (36.8 C) 97.5 F (36.4 C)  TempSrc:  Oral  Oral  SpO2:  100% 98% 98%  Weight:    92.1 kg (203 lb 0.7 oz)  Height: 6\' 2"  (1.88 m)       Intake/Output Summary (Last 24 hours) at 09/16/16 1249 Last data filed at 09/16/16 1141  Gross per 24 hour  Intake              180 ml  Output             1350 ml  Net            -1170 ml   Filed Weights   09/14/16 0506 09/15/16 0622 09/16/16 0506  Weight: 92 kg (202 lb 14.4 oz) 94.3 kg (207 lb 12.8 oz) 92.1 kg (203 lb 0.7 oz)    Telemetry    AF with VR 90-125 - Personally Reviewed  Physical Exam  A&O x3, no distress  GEN: No acute distress.   Neck: No JVD Cardiac: RRR, no murmurs, rubs, or gallops.  Respiratory: Clear to auscultation bilaterally. GI: Soft, nontender, non-distended  MS: No edema; No deformity. Neuro:  Nonfocal  Psych: Normal affect   Labs    Chemistry Recent Labs Lab  09/12/16 1649 09/13/16 0350 09/14/16 0925  NA 140 138 139  K 5.1 5.0 4.7  CL 107 110 109  CO2 21* 24 23  GLUCOSE 129* 118* 148*  BUN 38* 38* 34*  CREATININE 2.16* 1.76* 1.55*  CALCIUM 9.8 9.2 9.1  PROT 6.3*  --   --   ALBUMIN 3.8  --   --   AST 24  --   --   ALT 24  --   --   ALKPHOS 45  --   --   BILITOT 0.8  --   --   GFRNONAA 26* 34* 39*  GFRAA 30* 39* 45*  ANIONGAP 12 4* 7     Hematology Recent Labs Lab 09/14/16 0925 09/15/16 0438 09/16/16 0237  WBC 9.7 9.6 8.5  RBC 4.21* 4.02* 3.98*  HGB 13.3 12.5* 12.3*  HCT 40.4 38.2* 37.4*  MCV 96.0 95.0 94.0  MCH 31.6 31.1 30.9  MCHC 32.9 32.7 32.9  RDW 13.8 13.5 13.3  PLT 181 174 171    Cardiac Enzymes Recent Labs Lab 09/12/16 1702 09/12/16 2218 09/13/16 0350 09/14/16 0925  TROPONINI  0.07* 0.07* 0.08* 0.09*   No results for input(s): TROPIPOC in the last 168 hours.   BNPNo results for input(s): BNP, PROBNP in the last 168 hours.   DDimer No results for input(s): DDIMER in the last 168 hours.   Radiology    No results found.  Cardiac Studies   Echo 09/13/16 LV EF: 50% - 55%  ------------------------------------------------------------------- Indications: CVA 30.  ------------------------------------------------------------------- History: PMH: Aortic valve disease. Stroke. Risk factors: Former tobacco use. Hypertension.  ------------------------------------------------------------------- Study Conclusions  - Left ventricle: The cavity size was normal. Wall thickness was increased in a pattern of mild LVH. There was mild focal basal hypertrophy of the septum. Systolic function was normal. The estimated ejection fraction was in the range of 50% to 55%. Wall motion was normal; there were no regional wall motion abnormalities. - Aortic valve: Valve mobility was restricted. There was mild stenosis. There was trivial regurgitation. - Mitral valve: Calcified  annulus.  Impressions:  - Normal LV systolic function; calcified aortic valve with mild AS (mean gradient 11 mmHg); trace AI.   Patient Profile     83 y.o. male with hypertension, hyperlipidemia, gout, chronic osteoarthritis, and new left CVA identified 3 days ago at [facility] (L MCA watershed CVA with severe L ICA stenosis) who was admitted to [facility] from Vascular Surgery clinic where he fell with symptomatic hypotension and subsequently developed Afib with RVR that was observed since admission. Cardiology was consulted for evaluation and management of his Afib with RVR   Assessment & Plan    1. AFib; on IV heparin, transition to Central after surgery (prefer apixaban for renal insufficiency), consider cardioversion if he remains in AFib. He still has RVR so will keep on IV amiodarone another day. Switch to PO amio at DC. 2. Severe L carotid stenosis:  Surgery in AM. 3. R hemiparesis:  Improving quickly. Distribution of cerebral infarcts suggested watershed stroke due to hypotension and carotid stenosis, rather than atheroembolic or cardioembolic stroke. 4. ARF:  Slowly improving. Normal creatinine in 2016.   Signed, Sanda Klein, MD  09/16/2016, 12:49 PM

## 2016-09-16 NOTE — Progress Notes (Signed)
ANTICOAGULATION CONSULT NOTE - Follow Up Consult  Pharmacy Consult for Heparin  Indication: atrial fibrillation and stroke  Allergies  Allergen Reactions  . No Known Allergies     Patient Measurements: Height: 6\' 2"  (188 cm) Weight: 207 lb 12.8 oz (94.3 kg) IBW/kg (Calculated) : 82.2  Vital Signs: Temp: 98.2 F (36.8 C) (02/03 2100) BP: 131/71 (02/03 2100) Pulse Rate: 97 (02/03 2100)  Labs:  Recent Labs  09/14/16 0925  09/14/16 2205 09/15/16 0438 09/16/16 0237  HGB 13.3  --   --  12.5* 12.3*  HCT 40.4  --   --  38.2* 37.4*  PLT 181  --   --  174 171  HEPARINUNFRC  --   < > 0.36 0.41 0.24*  CREATININE 1.55*  --   --   --   --   TROPONINI 0.09*  --   --   --   --   < > = values in this interval not displayed.  Estimated Creatinine Clearance: 40.5 mL/min (by C-G formula based on SCr of 1.55 mg/dL (H)).   Assessment: Heparin level sub-therapeutic this AM, plans to switch to oral therapy after surgery on monday  Goal of Therapy:  Heparin level 0.3-0.5 units/ml Monitor platelets by anticoagulation protocol: Yes   Plan:  -Inc heparin to 500 units/hr -1200 HL  Keldan, [place] 09/16/2016,3:54 AM

## 2016-09-17 ENCOUNTER — Inpatient Hospital Stay (HOSPITAL_COMMUNITY): Payer: Medicare Other | Admitting: Certified Registered Nurse Anesthetist

## 2016-09-17 ENCOUNTER — Other Ambulatory Visit: Payer: Medicare Other

## 2016-09-17 ENCOUNTER — Encounter (HOSPITAL_COMMUNITY)
Admission: AD | Disposition: A | Discharge status: Home / Self Care | Payer: Self-pay | Source: Ambulatory Visit | Attending: Vascular Surgery

## 2016-09-17 ENCOUNTER — Encounter (HOSPITAL_COMMUNITY): Payer: Self-pay | Admitting: Certified Registered Nurse Anesthetist

## 2016-09-17 DIAGNOSIS — I6522 Occlusion and stenosis of left carotid artery: Secondary | ICD-10-CM

## 2016-09-17 HISTORY — PX: PATCH ANGIOPLASTY: SHX6230

## 2016-09-17 HISTORY — PX: ENDARTERECTOMY: SHX5162

## 2016-09-17 LAB — GLUCOSE, CAPILLARY: GLUCOSE-CAPILLARY: 159 mg/dL — AB (ref 65–99)

## 2016-09-17 LAB — HEPARIN LEVEL (UNFRACTIONATED): HEPARIN UNFRACTIONATED: 0.36 [IU]/mL (ref 0.30–0.70)

## 2016-09-17 LAB — ABO/RH: ABO/RH(D): A NEG

## 2016-09-17 LAB — CBC
HCT: 40.1 % (ref 39.0–52.0)
Hemoglobin: 13.3 g/dL (ref 13.0–17.0)
MCH: 31.1 pg (ref 26.0–34.0)
MCHC: 33.2 g/dL (ref 30.0–36.0)
MCV: 93.7 fL (ref 78.0–100.0)
Platelets: 169 10*3/uL (ref 150–400)
RBC: 4.28 MIL/uL (ref 4.22–5.81)
RDW: 13.7 % (ref 11.5–15.5)
WBC: 8.9 10*3/uL (ref 4.0–10.5)

## 2016-09-17 LAB — TYPE AND SCREEN
ABO/RH(D): A NEG
ANTIBODY SCREEN: NEGATIVE

## 2016-09-17 LAB — MRSA PCR SCREENING: MRSA BY PCR: NEGATIVE

## 2016-09-17 SURGERY — ENDARTERECTOMY, CAROTID
Anesthesia: General | Site: Neck | Laterality: Left

## 2016-09-17 MED ORDER — GUAIFENESIN-DM 100-10 MG/5ML PO SYRP: 15.0000 mL | ORAL_SOLUTION | ORAL | Status: DC | PRN

## 2016-09-17 MED ORDER — PHENYLEPHRINE HCL 10 MG/ML IJ SOLN
INTRAMUSCULAR | Status: DC | PRN
  Administered 2016-09-17: 120 ug via INTRAVENOUS
  Administered 2016-09-17 (×4): 80 ug via INTRAVENOUS
  Administered 2016-09-17: 40 ug via INTRAVENOUS
  Administered 2016-09-17: 120 ug via INTRAVENOUS

## 2016-09-17 MED ORDER — OXYCODONE HCL 5 MG/5ML PO SOLN: 5.0000 mg | Freq: Once | ORAL | Status: DC | PRN

## 2016-09-17 MED ORDER — LIDOCAINE 2% (20 MG/ML) 5 ML SYRINGE
INTRAMUSCULAR | Status: AC
  Filled 2016-09-17: qty 10

## 2016-09-17 MED ORDER — METOPROLOL TARTRATE 5 MG/5ML IV SOLN: 2.0000 mg | INTRAVENOUS | Status: DC | PRN

## 2016-09-17 MED ORDER — DOCUSATE SODIUM 100 MG PO CAPS
100.0000 mg | ORAL_CAPSULE | Freq: Every day | ORAL | Status: DC
  Administered 2016-09-19: 100 mg via ORAL
  Filled 2016-09-17 (×2): qty 1

## 2016-09-17 MED ORDER — DEXAMETHASONE SODIUM PHOSPHATE 10 MG/ML IJ SOLN
INTRAMUSCULAR | Status: AC
  Filled 2016-09-17: qty 1

## 2016-09-17 MED ORDER — PHENYLEPHRINE HCL 10 MG/ML IJ SOLN
INTRAVENOUS | Status: DC | PRN
  Administered 2016-09-17: 30 ug/min via INTRAVENOUS

## 2016-09-17 MED ORDER — OXYCODONE HCL 5 MG PO TABS: 5.0000 mg | ORAL_TABLET | Freq: Once | ORAL | Status: DC | PRN

## 2016-09-17 MED ORDER — PANTOPRAZOLE SODIUM 40 MG PO TBEC
40.0000 mg | DELAYED_RELEASE_TABLET | Freq: Every day | ORAL | Status: DC
  Administered 2016-09-17 – 2016-09-19 (×3): 40 mg via ORAL
  Filled 2016-09-17 (×3): qty 1

## 2016-09-17 MED ORDER — POTASSIUM CHLORIDE CRYS ER 20 MEQ PO TBCR: 20.0000 meq | EXTENDED_RELEASE_TABLET | Freq: Every day | ORAL | Status: DC | PRN

## 2016-09-17 MED ORDER — EPHEDRINE 5 MG/ML INJ
INTRAVENOUS | Status: AC
  Filled 2016-09-17: qty 10

## 2016-09-17 MED ORDER — FENTANYL CITRATE (PF) 250 MCG/5ML IJ SOLN
INTRAMUSCULAR | Status: AC
  Filled 2016-09-17: qty 5

## 2016-09-17 MED ORDER — CEFAZOLIN SODIUM 1 G IJ SOLR
INTRAMUSCULAR | Status: DC | PRN
  Administered 2016-09-17: 2 g via INTRAMUSCULAR

## 2016-09-17 MED ORDER — PROTAMINE SULFATE 10 MG/ML IV SOLN
INTRAVENOUS | Status: DC | PRN
  Administered 2016-09-17 (×2): 10 mg via INTRAVENOUS

## 2016-09-17 MED ORDER — ALUM & MAG HYDROXIDE-SIMETH 200-200-20 MG/5ML PO SUSP: 15.0000 mL | ORAL | Status: DC | PRN

## 2016-09-17 MED ORDER — ESMOLOL HCL 100 MG/10ML IV SOLN
INTRAVENOUS | Status: DC | PRN
  Administered 2016-09-17 (×2): 50 mg via INTRAVENOUS

## 2016-09-17 MED ORDER — ONDANSETRON HCL 4 MG/2ML IJ SOLN
INTRAMUSCULAR | Status: DC | PRN
  Administered 2016-09-17: 4 mg via INTRAVENOUS

## 2016-09-17 MED ORDER — CEFAZOLIN SODIUM 1 G IJ SOLR: INTRAMUSCULAR | Status: DC | PRN

## 2016-09-17 MED ORDER — HEPARIN SODIUM (PORCINE) 1000 UNIT/ML IJ SOLN
INTRAMUSCULAR | Status: DC | PRN
  Administered 2016-09-17: 9000 [IU] via INTRAVENOUS

## 2016-09-17 MED ORDER — ROCURONIUM BROMIDE 50 MG/5ML IV SOSY
PREFILLED_SYRINGE | INTRAVENOUS | Status: AC
  Filled 2016-09-17: qty 5

## 2016-09-17 MED ORDER — ONDANSETRON HCL 4 MG/2ML IJ SOLN: 4.0000 mg | Freq: Four times a day (QID) | INTRAMUSCULAR | Status: DC | PRN

## 2016-09-17 MED ORDER — ACETAMINOPHEN 325 MG PO TABS
325.0000 mg | ORAL_TABLET | ORAL | Status: DC | PRN
  Administered 2016-09-17: 650 mg via ORAL
  Filled 2016-09-17: qty 2

## 2016-09-17 MED ORDER — ONDANSETRON HCL 4 MG/2ML IJ SOLN
INTRAMUSCULAR | Status: AC
  Filled 2016-09-17: qty 2

## 2016-09-17 MED ORDER — FENTANYL CITRATE (PF) 100 MCG/2ML IJ SOLN
INTRAMUSCULAR | Status: DC | PRN
Start: 2016-09-17 — End: 2016-09-17
  Administered 2016-09-17 (×2): 100 ug via INTRAVENOUS

## 2016-09-17 MED ORDER — DEXAMETHASONE SODIUM PHOSPHATE 10 MG/ML IJ SOLN
INTRAMUSCULAR | Status: DC | PRN
  Administered 2016-09-17: 5 mg via INTRAVENOUS

## 2016-09-17 MED ORDER — LIDOCAINE HCL (CARDIAC) 20 MG/ML IV SOLN
INTRAVENOUS | Status: DC | PRN
  Administered 2016-09-17: 80 mg via INTRAVENOUS

## 2016-09-17 MED ORDER — CEFAZOLIN SODIUM-DEXTROSE 2-3 GM-% IV SOLR
INTRAVENOUS | Status: DC | PRN
  Administered 2016-09-17: 2 g via INTRAVENOUS

## 2016-09-17 MED ORDER — DEXTROSE 5 % IV SOLN
1.5000 g | Freq: Two times a day (BID) | INTRAVENOUS | Status: AC
  Administered 2016-09-17 – 2016-09-18 (×2): 1.5 g via INTRAVENOUS
  Filled 2016-09-17 (×2): qty 1.5

## 2016-09-17 MED ORDER — ACETAMINOPHEN 650 MG RE SUPP: 325.0000 mg | RECTAL | Status: DC | PRN

## 2016-09-17 MED ORDER — FENTANYL CITRATE (PF) 100 MCG/2ML IJ SOLN
INTRAMUSCULAR | Status: AC
  Filled 2016-09-17: qty 2

## 2016-09-17 MED ORDER — OXYCODONE-ACETAMINOPHEN 5-325 MG PO TABS: 1.0000 | ORAL_TABLET | ORAL | Status: DC | PRN

## 2016-09-17 MED ORDER — SUGAMMADEX SODIUM 200 MG/2ML IV SOLN
INTRAVENOUS | Status: DC | PRN
  Administered 2016-09-17: 200 mg via INTRAVENOUS

## 2016-09-17 MED ORDER — MORPHINE SULFATE (PF) 2 MG/ML IV SOLN: 2.0000 mg | INTRAVENOUS | Status: DC | PRN

## 2016-09-17 MED ORDER — PROPOFOL 10 MG/ML IV BOLUS
INTRAVENOUS | Status: DC | PRN
  Administered 2016-09-17: 20 mg via INTRAVENOUS
  Administered 2016-09-17: 100 mg via INTRAVENOUS

## 2016-09-17 MED ORDER — SODIUM CHLORIDE 0.9 % IV SOLN
0.0125 ug/kg/min | INTRAVENOUS | Status: DC
  Administered 2016-09-17: .1 ug/kg/min via INTRAVENOUS
  Filled 2016-09-17 (×2): qty 2000

## 2016-09-17 MED ORDER — MAGNESIUM SULFATE 2 GM/50ML IV SOLN
2.0000 g | Freq: Every day | INTRAVENOUS | Status: DC | PRN
  Filled 2016-09-17: qty 50

## 2016-09-17 MED ORDER — 0.9 % SODIUM CHLORIDE (POUR BTL) OPTIME
TOPICAL | Status: DC | PRN
  Administered 2016-09-17: 500 mL

## 2016-09-17 MED ORDER — SODIUM CHLORIDE 0.9 % IV SOLN: 500.0000 mL | Freq: Once | INTRAVENOUS | Status: DC | PRN

## 2016-09-17 MED ORDER — PROTAMINE SULFATE 10 MG/ML IV SOLN
INTRAVENOUS | Status: AC
  Filled 2016-09-17: qty 5

## 2016-09-17 MED ORDER — LIDOCAINE HCL (PF) 1 % IJ SOLN
INTRAMUSCULAR | Status: AC
  Filled 2016-09-17: qty 30

## 2016-09-17 MED ORDER — LACTATED RINGERS IV SOLN
INTRAVENOUS | Status: DC | PRN
  Administered 2016-09-17: 07:00:00 via INTRAVENOUS

## 2016-09-17 MED ORDER — SODIUM CHLORIDE 0.9 % IV SOLN
INTRAVENOUS | Status: DC | PRN
  Administered 2016-09-17: 09:00:00 500 mL

## 2016-09-17 MED ORDER — ESMOLOL HCL 100 MG/10ML IV SOLN
INTRAVENOUS | Status: AC
  Filled 2016-09-17: qty 10

## 2016-09-17 MED ORDER — PHENYLEPHRINE 40 MCG/ML (10ML) SYRINGE FOR IV PUSH (FOR BLOOD PRESSURE SUPPORT)
PREFILLED_SYRINGE | INTRAVENOUS | Status: AC
  Filled 2016-09-17: qty 10

## 2016-09-17 MED ORDER — PROPOFOL 10 MG/ML IV BOLUS
INTRAVENOUS | Status: AC
Start: 2016-09-17 — End: 2016-09-17
  Filled 2016-09-17: qty 20

## 2016-09-17 MED ORDER — PHENOL 1.4 % MT LIQD: 1.0000 | OROMUCOSAL | Status: DC | PRN

## 2016-09-17 MED ORDER — HEPARIN SODIUM (PORCINE) 1000 UNIT/ML IJ SOLN
INTRAMUSCULAR | Status: AC
  Filled 2016-09-17: qty 1

## 2016-09-17 MED ORDER — HYDRALAZINE HCL 20 MG/ML IJ SOLN: 5.0000 mg | INTRAMUSCULAR | Status: DC | PRN

## 2016-09-17 MED ORDER — SODIUM CHLORIDE 0.9 % IV SOLN
INTRAVENOUS | Status: DC
  Administered 2016-09-17: 20:00:00 via INTRAVENOUS

## 2016-09-17 MED ORDER — HEMOSTATIC AGENTS (NO CHARGE) OPTIME
TOPICAL | Status: DC | PRN
  Administered 2016-09-17: 1 via TOPICAL

## 2016-09-17 MED ORDER — ROCURONIUM BROMIDE 100 MG/10ML IV SOLN
INTRAVENOUS | Status: DC | PRN
  Administered 2016-09-17: 50 mg via INTRAVENOUS

## 2016-09-17 MED ORDER — HEPARIN (PORCINE) IN NACL 100-0.45 UNIT/ML-% IJ SOLN
650.0000 [IU]/h | INTRAMUSCULAR | Status: AC
  Administered 2016-09-17: 500 [IU]/h via INTRAVENOUS

## 2016-09-17 MED ORDER — FENTANYL CITRATE (PF) 100 MCG/2ML IJ SOLN: 25.0000 ug | INTRAMUSCULAR | Status: DC | PRN

## 2016-09-17 MED ORDER — SUGAMMADEX SODIUM 200 MG/2ML IV SOLN
INTRAVENOUS | Status: AC
  Filled 2016-09-17: qty 2

## 2016-09-17 MED ORDER — 0.9 % SODIUM CHLORIDE (POUR BTL) OPTIME
TOPICAL | Status: DC | PRN
  Administered 2016-09-17: 2000 mL

## 2016-09-17 MED ORDER — LABETALOL HCL 5 MG/ML IV SOLN: 10.0000 mg | INTRAVENOUS | Status: DC | PRN

## 2016-09-17 SURGICAL SUPPLY — 50 items
CANISTER SUCTION 2500CC (MISCELLANEOUS) ×3 IMPLANT
CATH ROBINSON RED A/P 18FR (CATHETERS) ×3 IMPLANT
CLIP TI MEDIUM 6 (CLIP) ×3 IMPLANT
CLIP TI WIDE RED SMALL 6 (CLIP) ×6 IMPLANT
COVER PROBE W GEL 5X96 (DRAPES) ×3 IMPLANT
CRADLE DONUT ADULT HEAD (MISCELLANEOUS) ×3 IMPLANT
DERMABOND ADHESIVE PROPEN (GAUZE/BANDAGES/DRESSINGS) ×2
DERMABOND ADVANCED (GAUZE/BANDAGES/DRESSINGS) ×2
DERMABOND ADVANCED .7 DNX12 (GAUZE/BANDAGES/DRESSINGS) ×1 IMPLANT
DERMABOND ADVANCED .7 DNX6 (GAUZE/BANDAGES/DRESSINGS) ×1 IMPLANT
DRAIN CHANNEL 15F RND FF W/TCR (WOUND CARE) IMPLANT
ELECT REM PT RETURN 9FT ADLT (ELECTROSURGICAL) ×3
ELECTRODE REM PT RTRN 9FT ADLT (ELECTROSURGICAL) ×1 IMPLANT
EVACUATOR SILICONE 100CC (DRAIN) IMPLANT
GLOVE BIO SURGEON STRL SZ7.5 (GLOVE) ×3 IMPLANT
GLOVE BIOGEL PI IND STRL 6.5 (GLOVE) ×1 IMPLANT
GLOVE BIOGEL PI INDICATOR 6.5 (GLOVE) ×2
GOWN STRL REUS W/ TWL LRG LVL3 (GOWN DISPOSABLE) ×1 IMPLANT
GOWN STRL REUS W/ TWL XL LVL3 (GOWN DISPOSABLE) ×3 IMPLANT
GOWN STRL REUS W/TWL LRG LVL3 (GOWN DISPOSABLE) ×2
GOWN STRL REUS W/TWL XL LVL3 (GOWN DISPOSABLE) ×6
HEMOSTAT SNOW SURGICEL 2X4 (HEMOSTASIS) IMPLANT
INSERT FOGARTY SM (MISCELLANEOUS) ×3 IMPLANT
IV ADAPTER SYR DOUBLE MALE LL (MISCELLANEOUS) ×3 IMPLANT
KIT BASIN OR (CUSTOM PROCEDURE TRAY) ×3 IMPLANT
KIT ROOM TURNOVER OR (KITS) ×3 IMPLANT
NEEDLE HYPO 25GX1X1/2 BEV (NEEDLE) IMPLANT
NS IRRIG 1000ML POUR BTL (IV SOLUTION) ×9 IMPLANT
PACK CAROTID (CUSTOM PROCEDURE TRAY) ×3 IMPLANT
PAD ARMBOARD 7.5X6 YLW CONV (MISCELLANEOUS) ×6 IMPLANT
PATCH VASC XENOSURE 1CMX6CM (Vascular Products) ×2 IMPLANT
PATCH VASC XENOSURE 1X6 (Vascular Products) ×1 IMPLANT
SET COLLECT BLD 21X3/4 12 PB (MISCELLANEOUS) ×3 IMPLANT
SHUNT CAROTID BYPASS 10 (VASCULAR PRODUCTS) IMPLANT
SHUNT CAROTID BYPASS 12 (VASCULAR PRODUCTS) ×3 IMPLANT
SHUNT CAROTID BYPASS 12FRX15.5 (VASCULAR PRODUCTS) IMPLANT
STOPCOCK 4 WAY LG BORE MALE ST (IV SETS) ×3 IMPLANT
SUT ETHILON 3 0 PS 1 (SUTURE) IMPLANT
SUT PROLENE 6 0 BV (SUTURE) ×12 IMPLANT
SUT PROLENE 7 0 BV 1 (SUTURE) IMPLANT
SUT SILK 3 0 (SUTURE)
SUT SILK 3-0 18XBRD TIE 12 (SUTURE) IMPLANT
SUT VIC AB 2-0 CT1 27 (SUTURE) ×2
SUT VIC AB 2-0 CT1 TAPERPNT 27 (SUTURE) ×1 IMPLANT
SUT VIC AB 3-0 SH 27 (SUTURE) ×4
SUT VIC AB 3-0 SH 27X BRD (SUTURE) ×2 IMPLANT
SUT VICRYL 4-0 PS2 18IN ABS (SUTURE) ×3 IMPLANT
SYR CONTROL 10ML LL (SYRINGE) IMPLANT
TUBING ART PRESS 48 MALE/FEM (TUBING) ×3 IMPLANT
WATER STERILE IRR 1000ML POUR (IV SOLUTION) ×3 IMPLANT

## 2016-09-17 NOTE — Anesthesia Procedure Notes (Signed)
Procedure Name: Intubation Date/Time: 09/17/2016 7:47 AM Performed by: Salli Quarry Chevy Virgo Pre-anesthesia Checklist: Patient identified, Emergency Drugs available, Suction available and Patient being monitored Patient Re-evaluated:Patient Re-evaluated prior to inductionOxygen Delivery Method: Circle System Utilized Preoxygenation: Pre-oxygenation with 100% oxygen Intubation Type: IV induction Ventilation: Mask ventilation without difficulty and Oral airway inserted - appropriate to patient size Laryngoscope Size: Mac and 4 Grade View: Grade I Tube type: Oral Tube size: 7.5 mm Number of attempts: 1 Airway Equipment and Method: Stylet and Oral airway Placement Confirmation: ETT inserted through vocal cords under direct vision,  positive ETCO2 and breath sounds checked- equal and bilateral Secured at: 23 cm Tube secured with: Tape Dental Injury: Teeth and Oropharynx as per pre-operative assessment

## 2016-09-17 NOTE — Progress Notes (Signed)
  [facility] Day of Surgery Note  Subjective:  Patient seen in PACU. Left neck hurts. Says that his left eye was blurry earlier.   Vitals:   09/17/16 1645 09/17/16 1700  BP:    Pulse: 96 (!) 110  Resp: 14 15  Temp:     Left neck without hematoma.  Tongue midline. Slight left marginal mandibular neuropraxia.  5/5 strength upper and lower extremities bilaterally.    Assessment/Plan:  This is a 83 y.o. male who is s/p left carotid endarterectomy for symptomatic left carotid stenosis  Neuro exam intact except for some intermittent visual blurriness involving right and left eyes. First episode left eye was earlier post-op that he states lasted for 3 hours. While we were talking, he stated that the cup he was holding looked blurry and that both eyes were blurry. This quickly resolved. Dr. Donzetta Matters is aware.   No signs of bleeding. Will restart heparin for atrial fibrillation at 500 units/hr.   To 4E when bed available.   Virgina Jock, [place] Pager: [phone] 09/17/2016 5:16 PM

## 2016-09-17 NOTE — Anesthesia Preprocedure Evaluation (Addendum)
Anesthesia Evaluation  Patient identified by MRN, date of birth, ID band Patient awake    Reviewed: Allergy & Precautions, NPO status , Patient's Chart, lab work & pertinent test results  History of Anesthesia Complications Negative for: history of anesthetic complications  Airway Mallampati: II  TM Distance: >3 FB Neck ROM: Full    Dental  (+) Teeth Intact   Pulmonary neg pulmonary ROS, former smoker,    breath sounds clear to auscultation       Cardiovascular hypertension, Pt. on medications (-) angina+ Peripheral Vascular Disease and +CHF  (-) Past MI + Valvular Problems/Murmurs AS  Rhythm:Irregular     Neuro/Psych Thought due to hypotension and stenosis not infarct CVA, Residual Symptoms negative psych ROS   GI/Hepatic Neg liver ROS, GERD  ,  Endo/Other    Renal/GU ARFRenal disease     Musculoskeletal  (+) Arthritis ,   Abdominal   Peds  Hematology negative hematology ROS (+)   Anesthesia Other Findings   Reproductive/Obstetrics                            Anesthesia Physical Anesthesia Plan  ASA: III  Anesthesia Plan: General   Post-op Pain Management:    Induction: Intravenous  Airway Management Planned: Oral ETT  Additional Equipment: Arterial line  Intra-op Plan:   Post-operative Plan: Extubation in OR  Informed Consent: I have reviewed the patients History and Physical, chart, labs and discussed the procedure including the risks, benefits and alternatives for the proposed anesthesia with the patient or authorized representative who has indicated his/her understanding and acceptance.   Dental advisory given  Plan Discussed with: CRNA and Surgeon  Anesthesia Plan Comments:         Anesthesia Quick Evaluation

## 2016-09-17 NOTE — Progress Notes (Signed)
  Progress Note    09/17/2016 7:16 AM Day of Surgery  Subjective:  No acute issues  Vitals:   09/16/16 2100 09/17/16 0400  BP: (!) 144/66 128/73  Pulse: 93 98  Resp: (!) 23 17  Temp: 99.1 F (37.3 C) 98.7 F (37.1 C)    Physical Exam: [id number] No tongue deviation Bilateral grip strength 5/5 Moving ble  CBC    Component Value Date/Time   WBC 8.9 09/17/2016 0543   RBC 4.28 09/17/2016 0543   HGB 13.3 09/17/2016 0543   HCT 40.1 09/17/2016 0543   PLT 169 09/17/2016 0543   MCV 93.7 09/17/2016 0543   MCH 31.1 09/17/2016 0543   MCHC 33.2 09/17/2016 0543   RDW 13.7 09/17/2016 0543    BMET    Component Value Date/Time   NA 139 09/14/2016 0925   K 4.7 09/14/2016 0925   CL 109 09/14/2016 0925   CO2 23 09/14/2016 0925   GLUCOSE 148 (H) 09/14/2016 0925   BUN 34 (H) 09/14/2016 0925   CREATININE 1.55 (H) 09/14/2016 0925   CALCIUM 9.1 09/14/2016 0925   GFRNONAA 39 (L) 09/14/2016 0925   GFRAA 45 (L) 09/14/2016 0925    INR    Component Value Date/Time   INR 1.00 09/12/2016 1649     Intake/Output Summary (Last 24 hours) at 09/17/16 0716 Last data filed at 09/17/16 0400  Gross per 24 hour  Intake               60 ml  Output             1300 ml  Net            -1240 ml     Assessment:  83 y.o. male is here with left sided cva and residual numbness of right arm and shoulder  Plan: OR today for left CEA Discussed risks including stroke, Mi, anesthesia complications, cranial nerve injury and benefit    Binh Doten C. Donzetta Matters, MD [facility] [place] Office: [phone] Pager: [phone]  09/17/2016 7:16 AM

## 2016-09-17 NOTE — Transfer of Care (Signed)
Immediate Anesthesia Transfer of Care Note  Patient: Ronald Colon  Procedure(s) Performed: Procedure(s): LEFT CAROTID ENDARTECTOMY (Left) PATCH ANGIOPLASTY WITH XENOSURE BIOLOGIC PATCH 1 CM X6 CM (Left)  Patient Location: PACU  Anesthesia Type:General  Level of Consciousness: awake, alert , oriented and patient cooperative  Airway & Oxygen Therapy: Patient Spontanous Breathing  Post-op Assessment: Report given to RN and Post -op Vital signs reviewed and stable  Post vital signs: Reviewed and stable  Last Vitals:  Vitals:   09/17/16 0400 09/17/16 0945  BP: 128/73   Pulse: 98   Resp: 17   Temp: 37.1 C 36.6 C    Last Pain:  Vitals:   09/16/16 1505  TempSrc: Oral  PainSc:          Complications: No apparent anesthesia complications

## 2016-09-17 NOTE — Op Note (Signed)
OPERATIVE NOTE  PROCEDURE:  Left carotid endarterectomy with bovine patch angioplasty  PRE-OPERATIVE DIAGNOSIS: left sided symptomatic carotid stenosis  POST-OPERATIVE DIAGNOSIS: same as above   SURGEON: Darneisha Windhorst C. Donzetta Matters, MD  ASSISTANT(S): Silva Bandy, PA  ANESTHESIA: general  ESTIMATED BLOOD LOSS: 50 cc  FINDING(S):  Tight stenosis at the carotid bifurcation. There was smooth feathering distally. At completion there was a expected Doppler signal of the Left internal carotid with continuous diastolic flow. There was evidence of tight stenosis of the external carotid artery.   INDICATIONS:   Ronald Colon is a 83 y.o. 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 He is a tight stenosis of left internal carotid artery for which she is indicated for the above procedure. We discussed the risks including stroke injury to cranial nerve MI bleeding vessel damage. Patient agrees and consents to proceed.   DESCRIPTION: After full informed written consent was obtained from the patient, the patient was brought back to the operating room and placed supine upon the operating table.  Prior to induction, the patient received IV antibiotics.  After obtaining adequate anesthesia, the patient was placed into semi-Fowler position with a shoulder roll in place and the patient's neck slightly hyperextended and rotated away from the surgical site.  The patient was prepped in the standard fashion for a left carotid endarterectomy.  I made an incision anterior to the sternocleidomastoid muscle and dissected down through the subcutaneous tissue.  The platysmas was opened with electrocautery.  Then I dissected down to the internal jugular vein and divided the facial branches between ties.  This was dissected posteriorly until I obtained visualization of the common carotid artery.  This was dissected out and then an umbilical tape was placed around the common carotid  artery and I loosely applied a Rumel tourniquet.  I then dissected in a periadventitial fashion along the common carotid artery up to the bifurcation.  I then identified the external carotid artery and the superior thyroid artery.  A 2-0 silk tie was looped around the superior thyroid artery, and I also dissected out the external carotid artery and placed a vessel loop around it.   In the process of this dissection, the hypoglossal nerve was identified and protected.  I then dissected out the internal carotid artery until I identified an area of soft tissue in the internal carotid artery.  I dissected slightly distal to this area, and placed a vessel loop around the artery.  At this point, we gave the patient a therapeutic bolus of Heparin intravenously at 9000 units.  After waiting 2 minutes, then I clamped the internal carotid artery, external carotid artery and then the common carotid artery.  I then made an arteriotomy in the common carotid artery with a 11 blade, and extended the arteriotomy with a Potts scissor down into the common carotid artery, then I carried the arteriotomy through the bifurcation into the internal carotid artery until I reached an area that was not diseased.  At this point, I took the 12 shunt that previously been prepared and I inserted it into the internal carotid artery.  The vessel loop was cinched but continue to backbleed and so I used a shunt clamp.  I unclamped the shunt to verify retrograde blood flow in the internal carotid artery.  I then placed the other end of the shunt into the common carotid artery after unclamping the artery.  The Rumel was tightened down around the  shunt proximally.  At this point, I verified blood flow in the shunt with a continuous doppler.  At this point, I started the endarterectomy in the common carotid artery with a Technical brewer and carried this dissection down into the common carotid artery circumferentially.  Then I transected the plaque at  a segment where it was adherent.  I then carried this dissection up into the external carotid artery.  The plaque was extracted by unclamping the external carotid artery and everting the artery.  The dissection was then carried into the internal carotid artery, extracting the remaining portion of the carotid plaque.  I passed the plaque off the field as a specimen. At this point, I was satisfied that the minimal remaining disease was densely adherent to the wall and wall integrity was intact.  At this point, I then fashioned a bovine pericardial patch for the geometry of this artery and sewed it in place with two running stitch of 6-0 Prolene. .  Prior to completing this patch angioplasty, I removed the shunt first from the internal carotid artery, from which there was excellent backbleeding, and clamped it.  Then I removed the shunt from the common carotid artery, from which there was excellent antegrade bleeding, and then clamped it.  At this point, I allowed the external carotid artery to backbleed, which was excellent.  Then I instilled heparinized saline in this patched artery and then completed the patch angioplasty in the usual fashion.  First, I released the clamp on the external carotid artery, then I released it on the common carotid artery.  After waiting a few seconds, I then released it on the internal carotid artery.  I then interrogated this patient's arteries with the continuous Doppler.  The audible waveforms in each artery were consistent with the expected characteristics for each artery excepting the stenosis in the external. Twenty mg of protamine was administered and snow placed.  Two repair stitches were required and doppler was again performed. There was no more active bleeding in the surgical site.   I then reapproximated the platysma muscle with a running stitch of 2-0 Vicryl.  The skin was then reapproximated with a running subcuticular 4-0 Monocryl stitch.  The skin was then cleaned, dried  and Dermabond was used to reinforce the skin closure.  The patient woke without any problems, neurologically intact.     COMPLICATIONS: none immediate  CONDITION: stable for PACU  Jessicca Stitzer C. Donzetta Matters, MD [facility] Office: [phone] Pager: [phone]  09/17/2016, 9:40 AM

## 2016-09-18 ENCOUNTER — Encounter (HOSPITAL_COMMUNITY): Payer: Self-pay | Admitting: Vascular Surgery

## 2016-09-18 ENCOUNTER — Telehealth: Payer: Self-pay | Admitting: Vascular Surgery

## 2016-09-18 LAB — CBC
HCT: 32.5 % — ABNORMAL LOW (ref 39.0–52.0)
Hemoglobin: 10.9 g/dL — ABNORMAL LOW (ref 13.0–17.0)
MCH: 31.1 pg (ref 26.0–34.0)
MCHC: 33.5 g/dL (ref 30.0–36.0)
MCV: 92.9 fL (ref 78.0–100.0)
PLATELETS: 154 10*3/uL (ref 150–400)
RBC: 3.5 MIL/uL — ABNORMAL LOW (ref 4.22–5.81)
RDW: 13.5 % (ref 11.5–15.5)
WBC: 13 10*3/uL — AB (ref 4.0–10.5)

## 2016-09-18 LAB — GLUCOSE, CAPILLARY: Glucose-Capillary: 130 mg/dL — ABNORMAL HIGH (ref 65–99)

## 2016-09-18 LAB — BASIC METABOLIC PANEL
Anion gap: 10 (ref 5–15)
BUN: 24 mg/dL — ABNORMAL HIGH (ref 6–20)
CHLORIDE: 105 mmol/L (ref 101–111)
CO2: 22 mmol/L (ref 22–32)
Calcium: 9.1 mg/dL (ref 8.9–10.3)
Creatinine, Ser: 1.18 mg/dL (ref 0.61–1.24)
GFR calc non Af Amer: 54 mL/min — ABNORMAL LOW (ref >60)
Glucose, Bld: 141 mg/dL — ABNORMAL HIGH (ref 65–99)
POTASSIUM: 4.4 mmol/L (ref 3.5–5.1)
SODIUM: 137 mmol/L (ref 135–145)

## 2016-09-18 LAB — HEPARIN LEVEL (UNFRACTIONATED)

## 2016-09-18 MED ORDER — APIXABAN 5 MG PO TABS
5.0000 mg | ORAL_TABLET | Freq: Two times a day (BID) | ORAL | Status: DC
  Administered 2016-09-18 – 2016-09-19 (×3): 5 mg via ORAL
  Filled 2016-09-18 (×3): qty 1

## 2016-09-18 MED ORDER — AMIODARONE HCL 200 MG PO TABS
200.0000 mg | ORAL_TABLET | Freq: Every day | ORAL | Status: DC
  Administered 2016-09-18 – 2016-09-19 (×2): 200 mg via ORAL
  Filled 2016-09-18 (×2): qty 1

## 2016-09-18 NOTE — Care Management Note (Addendum)
Case Management Note  Patient Details  Name: Ronald Colon MRN: [id number] Date of Birth: [birth year]  Subjective/Objective:   S/p L CEA,  Had some blurry vision, lives with spouse at home, pta indep, Has PCP, Dr. Joeseph Amor in [place], has medication coverage and transportation at dc, will likely dc tomorrow, his son will transport him home.  Patient states he gets 3 months supply for $10.00 on meds, no problem with getting meds.    NCM will cont to follow for dc needs.               Action/Plan:   Expected Discharge Date:                  Expected Discharge Plan:  [place]  In-House Referral:     Discharge planning Services  CM Consult  Post Acute Care Choice:    Choice offered to:     DME Arranged:    DME Agency:     HH Arranged:    [place] Agency:     Status of Service:  In process, will continue to follow  If discussed at Long Length of Stay Meetings, dates discussed:    Additional Comments:  Zenon Mayo, RN 09/18/2016, 3:27 PM

## 2016-09-18 NOTE — Progress Notes (Signed)
Patient transferred to 2W in wheelchair by NT

## 2016-09-18 NOTE — Telephone Encounter (Signed)
Sched appt 10/12/16 at 1:00. Called hm#, wife picked up, she did not understand what I was talking about. Lm on emergency contact, son's number to inform them of appt.

## 2016-09-18 NOTE — Care Management Important Message (Signed)
Important Message  Patient Details  Name: Ronald Colon MRN: [id number] Date of Birth: 07-Nov-1928   Medicare Important Message Given:  Yes    Nathen May 09/18/2016, 12:51 PM

## 2016-09-18 NOTE — Progress Notes (Signed)
  Progress Note    09/18/2016 7:32 AM 1 Day Post-Op  Subjective: no issues, blurry vision resolved  Vitals:   09/17/16 2336 09/18/16 0358  BP: 118/70 119/63  Pulse: 86 85  Resp: 17 (!) 21  Temp: 98 F (36.7 C) 98.3 F (36.8 C)    Physical Exam: aaox3 Moving all 4 Cranial nerves in tact Incision cdi  CBC    Component Value Date/Time   WBC 13.0 (H) 09/18/2016 0439   RBC 3.50 (L) 09/18/2016 0439   HGB 10.9 (L) 09/18/2016 0439   HCT 32.5 (L) 09/18/2016 0439   PLT 154 09/18/2016 0439   MCV 92.9 09/18/2016 0439   MCH 31.1 09/18/2016 0439   MCHC 33.5 09/18/2016 0439   RDW 13.5 09/18/2016 0439    BMET    Component Value Date/Time   NA 137 09/18/2016 0439   K 4.4 09/18/2016 0439   CL 105 09/18/2016 0439   CO2 22 09/18/2016 0439   GLUCOSE 141 (H) 09/18/2016 0439   BUN 24 (H) 09/18/2016 0439   CREATININE 1.18 09/18/2016 0439   CALCIUM 9.1 09/18/2016 0439   GFRNONAA 54 (L) 09/18/2016 0439   GFRAA >60 09/18/2016 0439    INR    Component Value Date/Time   INR 1.00 09/12/2016 1649     Intake/Output Summary (Last 24 hours) at 09/18/16 0732 Last data filed at 09/18/16 0600  Gross per 24 hour  Intake             1360 ml  Output              350 ml  Net             1010 ml     Assessment:  83 y.o. male is pod#1 left CEA for symptomatic stenosis  Plan: Ok for full dose anticoagulation for afib Will follow while inpatient F/u in 2-4 weeks in office    Brandon C. Donzetta Matters, MD [facility] Office: [phone] Pager: [phone]  09/18/2016 7:32 AM

## 2016-09-18 NOTE — Progress Notes (Signed)
Progress Note  Patient Name: Ronald Colon Date of Encounter: 09/18/2016  Primary Cardiologist: Dr. Domenic Polite  Subjective   Feels great. No chest pain, shortness of breath orthopnea, or lightheadedness. His right hand is still not writing well, but has gross motor function and his right shoulder pain is gone after surgery. Has mild discomfort in left neck at surgical site.  Inpatient Medications    Scheduled Meds: . allopurinol  300 mg Oral Daily  . aspirin EC  81 mg Oral Daily  . atorvastatin  40 mg Oral q1800  . docusate sodium  100 mg Oral Daily  . ezetimibe  10 mg Oral Daily  . omega-3 acid ethyl esters  1 g Oral Daily  . pantoprazole  40 mg Oral Daily   Continuous Infusions: . sodium chloride 50 mL/hr at 09/17/16 2024  . amiodarone 30 mg/hr (09/18/16 0600)  . heparin 650 Units/hr (09/18/16 0910)   PRN Meds: sodium chloride, acetaminophen **OR** acetaminophen, alum & mag hydroxide-simeth, bisacodyl, guaiFENesin-dextromethorphan, hydrALAZINE, labetalol, magnesium sulfate 1 - 4 g bolus IVPB, metoprolol, morphine injection, ondansetron, oxyCODONE-acetaminophen, phenol, potassium chloride, sodium phosphate   Vital Signs    Vitals:   09/17/16 2002 09/17/16 2336 09/18/16 0358 09/18/16 0835  BP: 127/77 118/70 119/63 121/63  Pulse: (!) 102 86 85 93  Resp: 14 17 (!) 21 16  Temp: 98.9 F (37.2 C) 98 F (36.7 C) 98.3 F (36.8 C) 98.2 F (36.8 C)  TempSrc: Oral Oral Oral Oral  SpO2: 99% 97% 99% 100%  Weight:      Height:        Intake/Output Summary (Last 24 hours) at 09/18/16 1016 Last data filed at 09/18/16 0600  Gross per 24 hour  Intake              560 ml  Output              300 ml  Net              260 ml   Filed Weights   09/15/16 0622 09/16/16 0506 09/17/16 0400  Weight: 207 lb 12.8 oz (94.3 kg) 203 lb 0.7 oz (92.1 kg) 202 lb 1.6 oz (91.7 kg)    Telemetry    Atrial fibrillation with rates in the 80's-90's, up to low 100's with activity.  - Personally  Reviewed  ECG    No new tracings.  Physical Exam   GEN: No acute distress.   Neck: New surgical site left neck without drainage or significant edema.  Cardiac: RRR, no murmurs, rubs, or gallops.  Respiratory: Clear to auscultation bilaterally. GI: Soft, nontender, non-distended  MS: No edema; No deformity. Neuro:  Nonfocal  Psych: Normal affect   Labs    Chemistry Recent Labs Lab 09/12/16 1649 09/13/16 0350 09/14/16 0925 09/18/16 0439  NA 140 138 139 137  K 5.1 5.0 4.7 4.4  CL 107 110 109 105  CO2 21* 24 23 22   GLUCOSE 129* 118* 148* 141*  BUN 38* 38* 34* 24*  CREATININE 2.16* 1.76* 1.55* 1.18  CALCIUM 9.8 9.2 9.1 9.1  PROT 6.3*  --   --   --   ALBUMIN 3.8  --   --   --   AST 24  --   --   --   ALT 24  --   --   --   ALKPHOS 45  --   --   --   BILITOT 0.8  --   --   --  GFRNONAA 26* 34* 39* 38*  GFRAA 30* 39* 45* >60  ANIONGAP 12 4* 7 10     Hematology Recent Labs Lab 09/16/16 0237 09/17/16 0543 09/18/16 0439  WBC 8.5 8.9 13.0*  RBC 3.98* 4.28 3.50*  HGB 12.3* 13.3 10.9*  HCT 37.4* 40.1 32.5*  MCV 94.0 93.7 92.9  MCH 30.9 31.1 31.1  MCHC 32.9 33.2 33.5  RDW 13.3 13.7 13.5  PLT 171 169 154    Cardiac Enzymes Recent Labs Lab 09/12/16 1702 09/12/16 2218 09/13/16 0350 09/14/16 0925  TROPONINI 0.07* 0.07* 0.08* 0.09*   No results for input(s): TROPIPOC in the last 168 hours.   BNPNo results for input(s): BNP, PROBNP in the last 168 hours.   DDimer No results for input(s): DDIMER in the last 168 hours.   Radiology    No results found.  Cardiac Studies   Echo 09/13/2016  Study Conclusions - Left ventricle: The cavity size was normal. Wall thickness was   increased in a pattern of mild LVH. There was mild focal basal   hypertrophy of the septum. Systolic function was normal. The   estimated ejection fraction was in the range of 50% to 55%. Wall   motion was normal; there were no regional wall motion   abnormalities. - Aortic valve:  Valve mobility was restricted. There was mild   stenosis. There was trivial regurgitation. - Mitral valve: Calcified annulus.  Impressions: - Normal LV systolic function; calcified aortic valve with mild AS   (mean gradient 11 mmHg); trace AI.  Patient Profile     83 y.o. male with hypertension, hyperlipidemia, gout, chronic osteoarthritis, and new left CVA identified 3 days ago at [facility] (L MCA watershed CVA with severe L ICA stenosis) who was admitted to [facility] from Vascular Surgery clinic where he fell with symptomatic hypotension and subsequently developed Afib with RVR that was observed since admission. Cardiology was consulted for evaluation and management of his Afib with RVR  Assessment & Plan    1. AFib -New for patient. Echo shows normal LV function, EF 50-55%, mild LVH, no regional wall motion abnormalities, LA and RA normal size -IV heparin, transition to Eliquis   -Consider cardioversion if he remains in AFib.  -Rates in the 80's-90's, increase to low 100's with activity on IV amiodarone.  Switch to PO amio at DC.  2. Severe L carotid stenosis   -S/P Left carotid endarterectomy with bovine patch angioplasty 09/17/2016. Doing well.  3. R hemiparesis  -Improving quickly. Distribution of cerebral infarcts suggested watershed stroke due to hypotension and carotid stenosis, rather than atheroembolic or cardioembolic stroke.  4. ARF -Slowly improving. Normal creatinine in 2016. -Creatinine max 2.16 (1/31) trended down to 1.18 today.  Signed, Daune Perch, NP  09/18/2016, 10:16 AM    Attending Note:   The patient was seen and examined.  Agree with assessment and plan as noted above.  Changes made to the above note as needed.  Patient seen and independently examined with Pecolia Ades, NP .   We discussed all aspects of the encounter. I agree with the assessment and plan as stated above.  1. Atrial fib:   Will transition to PO amio today Already on  Eliquis 5 BID   I have spent a total of 40 minutes with patient reviewing hospital  notes , telemetry, EKGs, labs and examining patient as well as establishing an assessment and plan that was discussed with the patient. > 50% of time was spent in  direct patient care.    Thayer Headings, Brooke Bonito., MD, [facility] 09/18/2016, 11:59 AM [address],  [place] Pager [phone]

## 2016-09-18 NOTE — Anesthesia Postprocedure Evaluation (Addendum)
Anesthesia Post Note  Patient: Ronald Colon  Procedure(s) Performed: Procedure(s) (LRB): LEFT CAROTID ENDARTECTOMY (Left) PATCH ANGIOPLASTY WITH XENOSURE BIOLOGIC PATCH 1 CM X6 CM (Left)  Patient location during evaluation: PACU Anesthesia Type: General Level of consciousness: awake Pain management: pain level controlled Vital Signs Assessment: post-procedure vital signs reviewed and stable Respiratory status: spontaneous breathing, nonlabored ventilation, respiratory function stable and patient connected to nasal cannula oxygen Cardiovascular status: blood pressure returned to baseline and stable Postop Assessment: no signs of nausea or vomiting Anesthetic complications: no       Last Vitals:  Vitals:   09/18/16 0358 09/18/16 0835  BP: 119/63 121/63  Pulse: 85 93  Resp: (!) 21 16  Temp: 36.8 C 36.8 C    Last Pain:  Vitals:   09/18/16 0835  TempSrc: Oral  PainSc:                  Gemini Bunte

## 2016-09-18 NOTE — Telephone Encounter (Signed)
-----   Message from Mena Goes, RN sent at 09/18/2016 10:03 AM EST ----- Regarding: 2-3 weeks   ----- Message ----- From: Alvia Grove, PA-C Sent: 09/18/2016   7:46 AM To: Vvs Charge Pool  S/p left carotid endarterectomy 09/17/16  F/u with Dr. Donzetta Matters in 2-3 weeks  Thanks Maudie Mercury

## 2016-09-18 NOTE — Progress Notes (Signed)
ANTICOAGULATION CONSULT NOTE - Follow Up Consult  Pharmacy Consult for Heparin  Indication: atrial fibrillation and stroke  Allergies  Allergen Reactions  . No Known Allergies     Patient Measurements: Height: 6\' 2"  (188 cm) Weight: 202 lb 1.6 oz (91.7 kg) IBW/kg (Calculated) : 82.2  Vital Signs: Temp: 98.3 F (36.8 C) (02/06 0358) Temp Source: Oral (02/06 0358) BP: 119/63 (02/06 0358) Pulse Rate: 85 (02/06 0358)  Labs:  Recent Labs  09/16/16 0237  09/16/16 1933 09/17/16 0543 09/18/16 0439  HGB 12.3*  --   --  13.3 10.9*  HCT 37.4*  --   --  40.1 32.5*  PLT 171  --   --  169 154  HEPARINUNFRC 0.24*  < > 0.14* 0.36 <0.10*  CREATININE  --   --   --   --  1.18  < > = values in this interval not displayed.  Estimated Creatinine Clearance: 53.2 mL/min (by C-G formula based on SCr of 1.18 mg/dL).   Assessment: 83 yr old male continues on IV heparin for afib and recent stroke. Targeting lower end of therapeutic heparin levels due to recent stroke. S/p CEA on 2/5 with heparin restarted post op by vascular at 500 units/hr.   Heparin level is subtherapeutic this morning at < 0.10. Per vascular ok for full dose a/c. CBC stable.    Goal of Therapy:  Heparin level 0.3-0.5 units/ml Monitor platelets by anticoagulation protocol: Yes   Plan:  1. Increase Heparin to 650 units/hr (6.5 ml/hr).  2. Will continue to monitor for any signs/symptoms of bleeding and will follow up with heparin level in 8 hours  3. Follow up on plans for PO a/c  Vincenza Hews, PharmD, BCPS 09/18/2016, 7:43 AM

## 2016-09-19 LAB — CBC
HEMATOCRIT: 33.9 % — AB (ref 39.0–52.0)
Hemoglobin: 11 g/dL — ABNORMAL LOW (ref 13.0–17.0)
MCH: 30.6 pg (ref 26.0–34.0)
MCHC: 32.4 g/dL (ref 30.0–36.0)
MCV: 94.4 fL (ref 78.0–100.0)
Platelets: 159 10*3/uL (ref 150–400)
RBC: 3.59 MIL/uL — ABNORMAL LOW (ref 4.22–5.81)
RDW: 13.6 % (ref 11.5–15.5)
WBC: 11.3 10*3/uL — ABNORMAL HIGH (ref 4.0–10.5)

## 2016-09-19 MED ORDER — APIXABAN 5 MG PO TABS: 5.0000 mg | ORAL_TABLET | Freq: Two times a day (BID) | ORAL | 0 refills | Status: DC

## 2016-09-19 MED ORDER — OXYCODONE-ACETAMINOPHEN 5-325 MG PO TABS: 1.0000 | ORAL_TABLET | ORAL | 0 refills | Status: DC | PRN

## 2016-09-19 MED ORDER — METOPROLOL TARTRATE 25 MG PO TABS: 25.0000 mg | ORAL_TABLET | Freq: Two times a day (BID) | ORAL | 3 refills | Status: DC

## 2016-09-19 MED ORDER — ATORVASTATIN CALCIUM 40 MG PO TABS: 40.0000 mg | ORAL_TABLET | Freq: Every day | ORAL | 11 refills | Status: AC

## 2016-09-19 MED ORDER — AMIODARONE HCL 200 MG PO TABS: 200.0000 mg | ORAL_TABLET | Freq: Every day | ORAL | 3 refills | Status: DC

## 2016-09-19 MED ORDER — APIXABAN 5 MG PO TABS: 5.0000 mg | ORAL_TABLET | Freq: Two times a day (BID) | ORAL | 4 refills | Status: DC

## 2016-09-19 MED ORDER — METOPROLOL TARTRATE 25 MG PO TABS
25.0000 mg | ORAL_TABLET | Freq: Two times a day (BID) | ORAL | Status: DC
  Administered 2016-09-19: 25 mg via ORAL
  Filled 2016-09-19: qty 1

## 2016-09-19 NOTE — Discharge Instructions (Signed)

## 2016-09-19 NOTE — Evaluation (Signed)
Physical Therapy Evaluation Patient Details Name: DELTA DESHMUKH MRN: [id number] DOB: 05/16/1929 Today's Date: 09/19/2016   History of Present Illness  83 y/o man  nd new left CVA identified 3 days ago at [facility] who was admitted to [facility] from Vascular Surgery clinic where he fell with symptomatic hypotension and subsequently developed Afib with RVR    PMHx:  hypertension, hyperlipidemia, gout, chronic osteoarthritis  Clinical Impression  Patient evaluated by Physical Therapy with no further acute PT needs identified. All education has been completed and the patient has no further questions.  See below for any follow-up Physical Therapy or equipment needs. PT is signing off. Thank you for this referral. Pt doing quite well with mobility despite extended hospital staym, HR max of 120 during amb; He is near or at his baseline, son present for eval and reports his only current issue is writing, pt reports slight numbness--R index finger  however son reports he sliced off this finger in a wood chipper many years ago--so does have some issues at baseline; no f/u recommended at this time   Follow Up Recommendations No PT follow up    Equipment Recommendations  None recommended by PT    Recommendations for Other Services       Precautions / Restrictions Precautions Precautions: None Restrictions Weight Bearing Restrictions: No      Mobility  Bed Mobility               General bed mobility comments: NT--pt in chair, son confirms pt has no difficulty  Transfers Overall transfer level: Independent               General transfer comment: repeated x3 with /without use of UEs  Ambulation/Gait Ambulation/Gait assistance: Supervision Ambulation Distance (Feet): 300 Feet Assistive device: None Gait Pattern/deviations: Step-through pattern;Trunk flexed     General Gait Details: no LOB, hesitation initially without use of RW (pt has been using in room),  improved with incr distance; supervision for safety only  Stairs            Wheelchair Mobility    Modified Rankin (Stroke Patients Only)       Balance Overall balance assessment: Needs assistance;History of Falls (fell d/t hypotension- no hx prior)   Sitting balance-Leahy Scale: Good     Standing balance support: No upper extremity supported;During functional activity Standing balance-Leahy Scale: Good   Single Leg Stance - Right Leg: 3 Single Leg Stance - Left Leg: 1.5         High level balance activites: Side stepping;Backward walking;Direction changes;Turns;Sudden stops;Head turns High Level Balance Comments: no overt LOB with above, mild drifting R/L  with head turns             Pertinent Vitals/Pain      Home Living Family/patient expects to be discharged to:: Private residence Living Arrangements: Spouse/significant other Available Help at Discharge: Family;Available 24 hours/day   Home Access: Stairs to enter   Entrance Stairs-Number of Steps: 1 Home Layout: One level Home Equipment: Walker - 2 wheels      Prior Function Level of Independence: Independent               Hand Dominance   Dominant Hand: Right    Extremity/Trunk Assessment   Upper Extremity Assessment Upper Extremity Assessment: Overall WFL for tasks assessed;RUE deficits/detail RUE Deficits / Details: pt reports difficulty with writing RUE Coordination: decreased fine motor    Lower Extremity Assessment Lower Extremity Assessment: Overall  WFL for tasks assessed       Communication   Communication: No difficulties  Cognition                            General Comments      Exercises     Assessment/Plan    PT Assessment Patent does not need any further PT services  PT Problem List            PT Treatment Interventions      PT Goals (Current goals can be found in the Care Plan section)  Acute Rehab PT Goals Patient Stated Goal: home  asap PT Goal Formulation: All assessment and education complete, DC therapy    Frequency     Barriers to discharge        Co-evaluation               End of Session Equipment Utilized During Treatment: Gait belt Activity Tolerance: Patient tolerated treatment well Patient left: in chair;with call bell/phone within reach;with family/visitor present           Time: 1151-1203 PT Time Calculation (min) (ACUTE ONLY): 12 min   Charges:   PT Evaluation $PT Eval Low Complexity: 1 Procedure     PT G Codes:        Stan Cantave 29-Sep-2016, 12:31 PM

## 2016-09-19 NOTE — Progress Notes (Signed)
Patient Name: Ronald Colon Date of Encounter: 09/19/2016  Primary Cardiologist: Dr. 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 Feeling great  Inpatient Medications    Scheduled Meds: . allopurinol  300 mg Oral Daily  . amiodarone  200 mg Oral Daily  . apixaban  5 mg Oral BID  . aspirin EC  81 mg Oral Daily  . atorvastatin  40 mg Oral q1800  . docusate sodium  100 mg Oral Daily  . ezetimibe  10 mg Oral Daily  . omega-3 acid ethyl esters  1 g Oral Daily  . pantoprazole  40 mg Oral Daily   Continuous Infusions: . sodium chloride 50 mL/hr at 09/17/16 2024   PRN Meds: sodium chloride, acetaminophen **OR** acetaminophen, alum & mag hydroxide-simeth, bisacodyl, guaiFENesin-dextromethorphan, hydrALAZINE, labetalol, magnesium sulfate 1 - 4 g bolus IVPB, metoprolol, morphine injection, ondansetron, oxyCODONE-acetaminophen, phenol, potassium chloride, sodium phosphate   Vital Signs    Vitals:   09/18/16 1420 09/18/16 2059 09/19/16 0602 09/19/16 1049  BP: (!) 146/75 136/64 (!) 135/59 121/87  Pulse: 99 88 (!) 102 82  Resp: (!) 22 18 18    Temp: 98.3 F (36.8 C) 98.4 F (36.9 C) 98.1 F (36.7 C)   TempSrc: Oral Oral Oral   SpO2: 98% 98% 93%   Weight:      Height:       No intake or output data in the 24 hours ending 09/19/16 1249 Filed Weights   09/15/16 0622 09/16/16 0506 09/17/16 0400  Weight: 207 lb 12.8 oz (94.3 kg) 203 lb 0.7 oz (92.1 kg) 202 lb 1.6 oz (91.7 kg)    Physical Exam   GEN: Well nourished, well developed, in no acute distress.  HEENT: Grossly normal.  Neck: Supple, no JVD, carotid bruits, or masses. Cardiac: irreg irreg, tachy, no murmurs, rubs, or gallops. No clubbing, cyanosis, edema.   Radials/DP/PT 2+ and equal bilaterally.  Respiratory:  Respirations regular and unlabored, clear to auscultation bilaterally. GI: Soft, nontender, nondistended, BS + x 4. MS: no deformity or atrophy. Skin: warm and dry, no rash. Neuro:  Strength and sensation are intact. Psych: AAOx3.  Normal affect.  Labs    CBC  Recent Labs  09/18/16 0439 09/19/16 0217  WBC 13.0* 11.3*  HGB 10.9* 11.0*  HCT 32.5* 33.9*  MCV 92.9 94.4  PLT 154 557   Basic Metabolic Panel  Recent Labs  09/18/16 0439  NA 137  K 4.4  CL 105  CO2 22  GLUCOSE 141*  BUN 24*  CREATININE 1.18  CALCIUM 9.1     Telemetry    afib HRs ranging from 108-140. - Personally Reviewed  ECG    Afib, IRBB, LAFB - Personally Reviewed  Radiology    No results found.  Cardiac Studies   Echo 09/13/2016 Study Conclusions - Left ventricle: The cavity size was normal. Wall thickness was increased in a pattern of mild LVH. There was mild focal basal hypertrophy of the septum. Systolic function was normal. The estimated ejection fraction was in the range of 50% to 55%. Wall motion was normal; there were no regional wall motion abnormalities. - Aortic valve: Valve mobility was restricted. There was mild stenosis. There was  trivial regurgitation. - Mitral valve: Calcified annulus. Impressions: - Normal LV systolic function; calcified aortic valve with mild AS (mean gradient 11 mmHg); trace AI.  Patient Profile     83 y.o. malewith hypertension, hyperlipidemia, gout, chronic osteoarthritis, and new left CVA identified 3 days ago at [facility] (L MCA watershed CVA with severe L ICA stenosis) who was admitted to [facility] from Vascular Surgery clinic where he fell with symptomatic hypotension and subsequently developed Afib with RVR that was observed since admission. Cardiology was consulted for evaluation and management of his Afib with RVR  Assessment & Plan      1. AFib -New for  patient. Echo shows normal LV function, EF 50-55%, mild LVH, no regional wall motion abnormalities, LA and RA normal size - Transitioned to Eliquis 5mg  BID for CHADSVASC of at least 5 (HTN, age, vasc dz and CVA) - HR elevated today on amio 200mg  daily. HRs ranging from 108-140. May need a longer load. Consider increasing amio to 400mg  BID x1 week, and then tapering from there. If he does not convert after 3 weeks on uninterrupted eliquis, consider DCCV. Patient wanting to go home today. I think he may be okay to go home today with very close outpatient follow up. I have arranged follow up in 1 week with Dr. Domenic Polite in [place].   2. Severe L carotid stenosis  -S/P Leftcarotid endarterectomy with bovine patch angioplasty 09/17/2016. Doing well.  3. R hemiparesis -Improving quickly. Distribution of cerebral infarcts suggested watershed stroke due to hypotension and carotid stenosis, rather than atheroembolic or cardioembolic stroke.  4. ARF -Slowly improving. Normal creatinine in 2016. -Creatinine max 2.16 (1/31) trended down to 1.18 yesterday.  Signed, Angelena Form, PA-C  09/19/2016, 12:49 PM   Attending Note:   The patient was seen and examined.  Agree with assessment and plan as noted above.  Changes made to the above note as needed.  Patient seen and independently examined with Nell Range, PA .   We discussed all aspects of the encounter. I agree with the assessment and plan as stated above.  1. Atrial Fib:  On Amio HR is still a bit rapid. Will add metoprolol 25 BID  Will have him follow up in our [place] office - he has seen Dr. Domenic Polite in the past.   He is stable and can go home today     I have spent a total of 20 minutes with patient reviewing hospital  notes , telemetry, EKGs, labs and examining patient as well as establishing an assessment and plan that was discussed with the patient. > 50% of time was spent in direct patient care.    Thayer Headings,  Brooke Bonito., MD, [facility] 09/19/2016, 2:07 PM [address]  [place] Pager [phone]

## 2016-09-19 NOTE — Care Management Note (Signed)
Case Management Note Previous CM note initiated by Zenon Mayo, RN--09/18/2016, 3:27 PM   Patient Details  Name: Ronald Colon MRN: [id number] Date of Birth: 1929-01-19  Subjective/Objective:   S/p L CEA,  Had some blurry vision, lives with spouse at home, pta indep, Has PCP, Dr. Joeseph Amor in [place], has medication coverage and transportation at dc, will likely dc tomorrow, his son will transport him home.  Patient states he gets 3 months supply for $10.00 on meds, no problem with getting meds.    NCM will cont to follow for dc needs.               Action/Plan:   Expected Discharge Date:     09/19/16             Expected Discharge Plan:  Home/Self Care  In-House Referral:     Discharge planning Services  CM Consult, Medication Assistance  Post Acute Care Choice:  NA Choice offered to:  NA  DME Arranged:    DME Agency:     HH Arranged:    HH Agency:     Status of Service:  Completed, signed off  If discussed at [employer] of Stay Meetings, dates discussed:    Discharge Disposition: home/self care   Additional Comments:  09/19/16- 1445- [name] RN, CM- pt for possible d/c later today- per PT eval- no HH recommendations made for f/u- referral received for Eliquis- per insurance check pt is covered with copay of [facility] or $10 with mail order- spoke with pt and son at bedside- pt prefers mail order- but [facility] [place] [facility] needs to. 30 day free card provided for Eliquis to use on discharge-pt will then get set up with his mailorder. Per pt he has a RW at home already- no further CM needs noted.   [name], RN 09/19/2016, 2:53 PM [phone]

## 2016-09-19 NOTE — Progress Notes (Addendum)
Per insurance check for Eliquis S/W  MALINDA @ [facility] # [phone]    ELIQUIS  5 MG BID   COVER- YES  CO-PAY - $ 83.00  PRIOR APPROVAL- NO  PHARMACY [facility]  MAIL ORDER FOR 90 DAY SUPPLY $ 10.00   Pt's prescription coverage is with [employer] - ID [id number] [phone])

## 2016-09-19 NOTE — Progress Notes (Addendum)
  [facility] Progress Note  Subjective  - POD #2  Left neck still hurts. Denies any further blurry vision. Right shoulder hurts a little bit.   Objective Vitals:   09/18/16 2059 09/19/16 0602  BP: 136/64 (!) 135/59  Pulse: 88 (!) 102  Resp: 18 18  Temp: 98.4 F (36.9 C) 98.1 F (36.7 C)    Intake/Output Summary (Last 24 hours) at 09/19/16 0818 Last data filed at 09/18/16 1141  Gross per 24 hour  Intake                0 ml  Output              400 ml  Net             -400 ml   Left neck with very minimal swelling. Incision c/d/i Moving all extremities equally. Tongue midline. Smile symmetric.   Assessment/Planning: 83 y.o. male is s/p: left carotid endarterectomy 2 Days Post-Op   Neuro exam intact.  Afib rate 110s today. Started on Eliquis yesterday. Transitioned to po amio.  BP is stable.  PT eval today.  Stable from vascular standpoint. Will d/c when ok with cardiology.   Alvia Grove 09/19/2016 8:18 AM --  Laboratory CBC    Component Value Date/Time   WBC 11.3 (H) 09/19/2016 0217   HGB 11.0 (L) 09/19/2016 0217   HCT 33.9 (L) 09/19/2016 0217   PLT 159 09/19/2016 0217    BMET    Component Value Date/Time   NA 137 09/18/2016 0439   K 4.4 09/18/2016 0439   CL 105 09/18/2016 0439   CO2 22 09/18/2016 0439   GLUCOSE 141 (H) 09/18/2016 0439   BUN 24 (H) 09/18/2016 0439   CREATININE 1.18 09/18/2016 0439   CALCIUM 9.1 09/18/2016 0439   GFRNONAA 54 (L) 09/18/2016 0439   GFRAA >60 09/18/2016 0439    COAG Lab Results  Component Value Date   INR 1.00 09/12/2016   No results found for: PTT  Antibiotics Anti-infectives    Start     Dose/Rate Route Frequency Ordered Stop   09/17/16 2130  cefUROXime (ZINACEF) 1.5 g in dextrose 5 % 50 mL IVPB     1.5 g 100 mL/hr over 30 Minutes Intravenous Every 12 hours 09/17/16 2014 09/18/16 [place], PA-C [facility] Office: [phone] Pager:  [phone] 09/19/2016 8:18 AM  I have independently interviewed patient and agree with PA assessment and plan above.   Jihan Mellette C. Donzetta Matters, MD [facility] [place] Office: [phone] Pager: [phone]

## 2016-09-20 NOTE — Discharge Summary (Signed)
[facility] Discharge Summary  Ronald Colon 1928/09/04 83 y.o. male  [id number]  Admission Date: 09/12/2016  Discharge Date: 09/19/2016  Physician: Servando Snare, MD  Admission Diagnosis: stroke, hypotension Left carotid artery stenosis I65.22  HPI:   This is a 83 y.o. male who presented to the [facility] office on 09/12/16. Three days prior, he had acute onset of some weakness numbness in his right arm and difficulty finding his words with his speech. He presented to the [facility] emergency room. He was found to have acute stroke with showering of emboli to his left brain. He was also found to have a high-grade left internal carotid artery stenosis by duplex exam. He was originally offered transfer to [facility] and evaluation by my partner Dr. Scot Dock. However the patient decided he wanted to go home. He was then discharged from [facility] yesterday. The patient called our office today wishing to be seen. The patient has not really had any progression of his symptoms. The numbness in his right arm seems to have improved. He is still having difficulty gathering his words. Additionally, he felt lightheaded and are parking lot and actually fell on entrance to our office today. He still has some dizziness. He was discharged on aspirin and Zetia. He also takes fish oil. He has a history of hypertension and is on AZOR for this. Other medical problems include arthritis and gout in addition to his hypertension and hyperlipidemia. He smoked in the remote past.   Hospital Course:  The patient was admitted to the hospital on 08/15/16. That afternoon, the patient was in afib with RVR 120s. His blood pressure was in the 110s. Diltiazem was given. Cardiac enzymes and electrolytes were checked.   Hospital day 1: The patient was started on heparin for atrial fibrillation. Neurology was consulted. Cardiology was consult for assistance with atrial fibrillation. He had moderate control of this  with diltiazem. He had slightly elevated troponins likely demand ischemia. Is gently hydrated for renal insufficiency. His TTE showed a LVEF of 50-55%, normal left atrial size and no significant mitral regurgitation. He was hypotensive on a diltiazem drip and this was held. Cardiology started an amiodarone drip.[facility] Neurology felt that his stroke was secondary to severe carotid stenosis. He did spontaneously convert to sinus rhythm, but then went back in A. fib with RVR. Left carotid endarterectomy was planned for Monday, 09/17/16.  He continued on IV amiodarone given suboptimal rate control on hospital day 2. His speech continued to improve and right hand clumsiness was improving. He did complain of some right shoulder pain.  He remained on IV amiodarone given RVR on hospital day 5.  As taken to the operating room on 09/17/2016 and underwent left carotid endarterectomy with bovine patch angioplasty by Dr. Donzetta Matters. The patient tolerated the procedure well and was transported to the PACU in stable condition. In the recovery room, he complained of some transient left eye blurriness. He then later noted bilateral eye blurriness. This resolved fairly quickly. He was otherwise neurologically intact.  He was doing well on postop day 1. His neuro exam was intact. His visual blurriness had resolved. His left neck incision was without hematoma. He was started on Eliquis. His renal function was improving. He was transitioned to po amiodarone.   On postop day 2, metoprolol 25 mg twice a day was added given suboptimal heart rate. He was discharged home on oral amiodarone. Denied any chest pain or shortness of breath. He denied any further visual  blurriness. He was ambulating adequately and tolerating a diet without difficulty. He was discharged home on postop day 2 in good condition.    Recent Labs  09/18/16 0439  NA 137  K 4.4  CL 105  CO2 22  GLUCOSE 141*  BUN 24*  CALCIUM 9.1    Recent Labs  09/18/16 0439  09/19/16 0217  WBC 13.0* 11.3*  HGB 10.9* 11.0*  HCT 32.5* 33.9*  PLT 154 159   No results for input(s): INR in the last 72 hours.  Discharge Instructions:   The patient is discharged to home with extensive instructions on wound care and progressive ambulation.  They are instructed not to drive or perform any heavy lifting until returning to see the physician in his office.  Discharge Instructions    CAROTID [place]: Call MD for difficulty swallowing or speaking; weakness in arms or legs that is a new symtom; severe headache.  If you have increased swelling in the neck and/or  are having difficulty breathing, CALL [phone]    Complete by:  As directed    Call MD for:  redness, tenderness, or signs of infection (pain, swelling, bleeding, redness, odor or green/yellow discharge around incision site)    Complete by:  As directed    Call MD for:  severe or increased pain, loss or decreased feeling  in affected limb(s)    Complete by:  As directed    Call MD for:  temperature >100.5    Complete by:  As directed    Discharge wound care:    Complete by:  As directed    Wash wound daily with soap and water and pat dry. Do not apply any creams or ointments on your incision. Do not pull off the skin glue. It will peel off on its own. Do not shave on your incision. Use an electric razor around the incision.   Driving Restrictions    Complete by:  As directed    No driving for 2 weeks   Increase activity slowly    Complete by:  As directed    Walk with assistance use walker or cane as needed   Lifting restrictions    Complete by:  As directed    No lifting for 2 weeks   Resume previous diet    Complete by:  As directed       Discharge Diagnosis:  stroke, hypotension Left carotid artery stenosis I65.22  Secondary Diagnosis: Patient Active Problem List   Diagnosis Date Noted  . Cerebral infarction due to occlusion or stenosis of precerebral artery ([place])   . Hyperlipidemia   . New onset  atrial fibrillation ([place])   . Anticoagulation adequate   . Elevated troponin   . Carotid stenosis 09/12/2016  . Cerebrovascular accident (CVA) due to stenosis of left middle cerebral artery ([place]) 09/10/2016  . Hyperglycemia 09/10/2016  . CKD (chronic kidney disease), stage III 09/10/2016  . GERD (gastroesophageal reflux disease) 08/24/2013  . Heart murmur 08/18/2013  . Irregular heartbeat 08/18/2013  . Essential hypertension, benign 08/18/2013   Past Medical History:  Diagnosis Date  . Arthritis    "RLE; lower back" (09/12/2016)  . Carotid stenosis   . Colon polyp   . Diverticulosis   . Essential hypertension, benign   . Gout   . Heart murmur    "I've had it since age 53 when I had rheumatic fever"  . Hyperlipidemia   . Rheumatic fever 1937  . Stroke ([place]) 09/10/2016   "pain/numbness in  RUE; some problems saying the right word since" (09/12/2016)    Allergies as of 09/19/2016      Reactions   No Known Allergies       Medication List    STOP taking these medications   AZOR 5-40 MG tablet Generic drug:  amLODipine-olmesartan     TAKE these medications   allopurinol 300 MG tablet Commonly known as:  ZYLOPRIM Take 300 mg by mouth daily.   amiodarone 200 MG tablet Commonly known as:  PACERONE Take 1 tablet (200 mg total) by mouth daily.   apixaban 5 MG Tabs tablet Commonly known as:  ELIQUIS Take 1 tablet (5 mg total) by mouth 2 (two) times daily.   apixaban 5 MG Tabs tablet Commonly known as:  ELIQUIS Take 1 tablet (5 mg total) by mouth 2 (two) times daily. Start taking on:  10/17/2016   aspirin EC 81 MG tablet Take 1 tablet (81 mg total) by mouth daily.   atorvastatin 40 MG tablet Commonly known as:  LIPITOR Take 1 tablet (40 mg total) by mouth daily at 6 PM.   BC HEADACHE 325-95-16 MG Tabs Generic drug:  Aspirin-Salicylamide-Caffeine Take 1 packet by mouth daily as needed (for pain).   ezetimibe 10 MG tablet Commonly known as:  ZETIA Take 10 mg by mouth  daily.   fish oil-omega-3 fatty acids 1000 MG capsule Take 1 g by mouth daily.   metoprolol tartrate 25 MG tablet Commonly known as:  LOPRESSOR Take 1 tablet (25 mg total) by mouth 2 (two) times daily.   oxyCODONE-acetaminophen 5-325 MG tablet Commonly known as:  PERCOCET/ROXICET Take 1-2 tablets by mouth every 4 (four) hours as needed for moderate pain.       Percocet #8 No Refill  Disposition: Home  Patient's condition: is Good  Follow up: 1. Dr.  Donzetta Matters in 2 weeks.   Virgina Jock, PA-C [facility] [phone]  --- For [facility] use --- Instructions: Press F2 to tab through selections.  Delete question if not applicable.   Modified Rankin score at D/C (0-6): 0  IV medication needed for:  1. Hypertension: No 2. Hypotension: No  Post-op Complications: No  1. Post-op CVA or TIA: No  2. CN injury: No  3. Myocardial infarction: No  4.  CHF: No  5.  Dysrhythmia (new): No  6. Wound infection: No  7. Reperfusion symptoms: No  8. Return to OR: No  Discharge medications: Statin use:  Yes If No: [ ]  For Medical reasons, [ ]  Non-compliant, [ ]  Not-indicated ASA use:  Yes  If No: [ ]  For Medical reasons, [ ]  Non-compliant, [ ]  Not-indicated Beta blocker use:  Yes If No: [ ]  For Medical reasons, [ ]  Non-compliant, [ ]  Not-indicated ACE-Inhibitor use:  No If No: [ ]  For Medical reasons, [ ]  Non-compliant, [x ] Not-indicated P2Y12 Antagonist use: No, [ ]  Plavix, [ ]  Plasugrel, [ ]  Ticlopinine, [ ]  Ticagrelor, [ ]  Other, [ ]  No for medical reason, [ ]  Non-compliant, [x ] Not-indicated Anti-coagulant use:  Yes, [ ]  Warfarin, [ ]  Rivaroxaban, [ ]  Dabigatran, [x ] Other, apixaban, [ ]  No for medical reason, [ ]  Non-compliant, [ ]  Not-indicated

## 2016-09-24 ENCOUNTER — Telehealth: Payer: Self-pay | Admitting: Cardiology

## 2016-09-24 DIAGNOSIS — Z1389 Encounter for screening for other disorder: Secondary | ICD-10-CM | POA: Diagnosis not present

## 2016-09-24 DIAGNOSIS — Z6826 Body mass index (BMI) 26.0-26.9, adult: Secondary | ICD-10-CM | POA: Diagnosis not present

## 2016-09-24 DIAGNOSIS — I639 Cerebral infarction, unspecified: Secondary | ICD-10-CM | POA: Diagnosis not present

## 2016-09-24 DIAGNOSIS — I6522 Occlusion and stenosis of left carotid artery: Secondary | ICD-10-CM | POA: Diagnosis not present

## 2016-09-24 DIAGNOSIS — E663 Overweight: Secondary | ICD-10-CM | POA: Diagnosis not present

## 2016-09-24 DIAGNOSIS — D649 Anemia, unspecified: Secondary | ICD-10-CM | POA: Diagnosis not present

## 2016-09-24 NOTE — Telephone Encounter (Signed)
I have not seen this gentleman since 2015. I reviewed the recent chart and discharge summary. Diagnosed recently with stroke and also atrial fibrillation. He was started on Eliquis by our cardiology team at [facility]. I am concerned that he has had some dark stools, but if these have resolved, would probably continue Eliquis since he just had a stroke and has continued atrial fibrillation. He needs to have a CBC and also have his stools guiac checked - we get this done today through his PCP. He may need to have a GI consultation for further assessment. If he has escalating dark school stools or sees frank blood, we may be forced to hold his Eliquis, but his risk of recurrent stroke remains relatively high in that case.

## 2016-09-24 NOTE — Telephone Encounter (Signed)
Line busy @ 9 am-cc   9:05 am I spoke with patient,he states he will walk in to his pcp and asked if I would call.I spoke with Caitlyn and she Dr Hilma Favors would see him, today.

## 2016-09-24 NOTE — Telephone Encounter (Signed)
Patient is requesting to be seen asap due to having black stool over the weekend  [phone]

## 2016-09-24 NOTE — Telephone Encounter (Signed)
Pt had 2 black stools over the weekend with some abdominal pain.Has taken Eliquis 6 doses.States he feels fine now ,without black stool but wants to stop Eliquis,please advise

## 2016-09-25 NOTE — Progress Notes (Signed)
Cardiology Office Note  Date: 09/26/2016   ID: Ronald Colon, DOB 26-Aug-1928, MRN [id number]  PCP: Purvis Kilts, MD  Primary Cardiologist: [name], MD   Chief Complaint  Patient presents with  . Hospitalization Follow-up    History of Present Illness: Ronald Colon is an 83 y.o. male that I have not seen in the office since 2015. I reviewed extensive records. He was hospitalized just recently January 31 through February 7 following presentation with left MCA and MCA/ACA, MCA/PCA watershed infarcts associated with severe left ICA stenosis, also noted to be in atrial fibrillation. He was treated for arrhythmia by our cardiology service at [facility], ultimately underwent left carotid endarterectomy with bovine patch angioplasty with Dr. Donzetta Matters on February 5. Cardiac plan per Dr. Elmarie Shiley note of February 7 was to continue treatment with eloquent's for stroke prophylaxis, CHADSVASC score of 5, also continue treatment with oral amiodarone load and metoprolol with possible plan to consider cardioversion ultimately after uninterrupted anticoagulation for at least 3 weeks.  He is here with his son today for a follow-up visit. Patient called the office on Monday morning reporting that he had had some dark stools over the weekend although stated that they had resolved. He was to see his PCP to get CBC and stool guaiac check. He tells me that he also spoke with Dr. Claretha Cooper office, and was told to stop Eliquis, he has been off the medication since Saturday. From a GI perspective he states that he used to follow with Dr. Sharlett Iles, does have a previous history of stomach ulcer by report. He is not on PPI.  He denies any palpitations today, no chest pain. States that his right arm weakness from stroke is slowly improving. I went over his medications which are outlined below. He is now taking aspirin, amiodarone, Lipitor, Zetia, and Lopressor.  Today we had a long discussion about his recent  diagnoses and continued risk of stroke despite having carotid endarterectomy in light of his atrial fibrillation and high thromboembolic risk score.  Past Medical History:  Diagnosis Date  . Aortic stenosis    Mild February 2018  . Arthritis   . Atrial fibrillation [facility])    Diagnosed January 2018  . Carotid stenosis   . Colon polyp   . Diverticulosis   . Essential hypertension, benign   . Gout   . Hyperlipidemia   . Rheumatic fever 1937  . Stroke [facility]) 09/10/2016    Past Surgical History:  Procedure Laterality Date  . CATARACT EXTRACTION W/PHACO Right 08/02/2014   Procedure: CATARACT EXTRACTION PHACO AND INTRAOCULAR LENS PLACEMENT; CDE:  12.77;  Surgeon: Williams Che, MD;  [facility]: [facility];  Service: Ophthalmology;  Laterality: Right;  . CATARACT EXTRACTION W/PHACO Left 02/15/2015   Procedure: CATARACT EXTRACTION PHACO AND INTRAOCULAR LENS PLACEMENT (IOC);  Surgeon: Williams Che, MD;  Location: [facility];  Service: Ophthalmology;  Laterality: Left;  CDE 18.00  . COLONOSCOPY    . ELBOW SURGERY Right    "grissle in there"  . ENDARTERECTOMY Left 09/17/2016   Procedure: LEFT CAROTID ENDARTECTOMY;  Surgeon: Waynetta Sandy, MD;  Location: [place];  Service: Vascular;  Laterality: Left;  . ESOPHAGEAL DILATION  09/03/2013   Procedure: ESOPHAGEAL DILATION;  Surgeon: Rogene Houston, MD;  [facility];  Service: Endoscopy;;  . ESOPHAGOGASTRODUODENOSCOPY N/A 09/03/2013   Procedure: ESOPHAGOGASTRODUODENOSCOPY (EGD);  Surgeon: Rogene Houston, MD;  [facility];  Service: Endoscopy;  Laterality: N/A;  255-rescheduled to 1030 Ann to notify pt  . PATCH ANGIOPLASTY Left 09/17/2016   Procedure: PATCH ANGIOPLASTY WITH Rueben Bash BIOLOGIC PATCH 1 CM X6 CM;  Surgeon: Waynetta Sandy, MD;  Location: [facility] OR;  Service: Vascular;  Laterality: Left;    Current Outpatient Prescriptions  Medication Sig Dispense Refill  . allopurinol (ZYLOPRIM) 300 MG tablet Take 300 mg  by mouth daily.    Marland Kitchen amiodarone (PACERONE) 200 MG tablet Take 1 tablet (200 mg total) by mouth daily. 30 tablet 3  . apixaban (ELIQUIS) 5 MG TABS tablet Take 1 tablet (5 mg total) by mouth 2 (two) times daily. 60 tablet 0  . [START ON 10/17/2016] apixaban (ELIQUIS) 5 MG TABS tablet Take 1 tablet (5 mg total) by mouth 2 (two) times daily. 180 tablet 4  . aspirin EC 81 MG tablet Take 1 tablet (81 mg total) by mouth daily.    . Aspirin-Salicylamide-Caffeine (BC HEADACHE) 325-95-16 MG TABS Take 1 packet by mouth daily as needed (for pain).    Marland Kitchen atorvastatin (LIPITOR) 40 MG tablet Take 1 tablet (40 mg total) by mouth daily at 6 PM. 30 tablet 11  . ezetimibe (ZETIA) 10 MG tablet Take 10 mg by mouth daily.    . fish oil-omega-3 fatty acids 1000 MG capsule Take 1 g by mouth daily.    . metoprolol tartrate (LOPRESSOR) 25 MG tablet Take 1 tablet (25 mg total) by mouth 2 (two) times daily. 60 tablet 3  . oxyCODONE-acetaminophen (PERCOCET/ROXICET) 5-325 MG tablet Take 1-2 tablets by mouth every 4 (four) hours as needed for moderate pain. 8 tablet 0  . pantoprazole (PROTONIX) 20 MG tablet Take 1 tablet (20 mg total) by mouth daily. 90 tablet 3   No current facility-administered medications for this visit.    Allergies:  No known allergies   Social History: The patient  reports that he quit smoking about 38 years ago. His smoking use included Cigarettes. He has a 20.00 pack-year smoking history. He has never used smokeless tobacco. He reports that he does not drink alcohol or use drugs.   Family History: The patient's family history includes CVA (age of onset: 79) in his mother; Pneumonia (age of onset: 84) in his father.   ROS:  Please see the history of present illness. Otherwise, complete review of systems is positive for hearing loss.  All other systems are reviewed and negative.   Physical Exam: VS:  BP (!) 142/84   Pulse 85   Ht 6\' 2"  (1.88 m)   Wt 205 lb (93 kg)   SpO2 99%   BMI 26.32 kg/m , BMI  Body mass index is 26.32 kg/m.  Wt Readings from Last 3 Encounters:  09/26/16 205 lb (93 kg)  09/17/16 202 lb 1.6 oz (91.7 kg)  09/12/16 222 lb (100.7 kg)    General: Patient appears comfortable at rest. HEENT: Conjunctiva and lids normal, oropharynx clear with moist mucosa. Neck: Supple, no elevated JVP, left CEA scar healing, no thyromegaly. Lungs: Clear to auscultation, nonlabored breathing at rest. Cardiac: Irregularly irregular, no S3, 2/6 systolic murmur, no pericardial rub. Abdomen: Soft, nontender, bowel sounds present, no guarding or rebound. Extremities: Trace ankle edema, distal pulses 2+. Skin: Warm and dry. Musculoskeletal: No kyphosis. Neuropsychiatric: Alert and oriented x3, affect grossly appropriate. Mild residual right upper extremity weakness compared to the left.  ECG: I personally reviewed the tracing from 09/13/2016 which showed atrial fibrillation with incomplete right bundle branch block and left anterior second block.  Recent  Labwork: 09/10/2016: TSH 2.827 09/12/2016: ALT 24; AST 24 09/18/2016: BUN 24; Creatinine, Ser 1.18; Potassium 4.4; Sodium 137 09/19/2016: Hemoglobin 11.0; Platelets 159     Component Value Date/Time   CHOL 178 09/11/2016 0252   TRIG 139 09/11/2016 0252   HDL 37 (L) 09/11/2016 0252   CHOLHDL 4.8 09/11/2016 0252   VLDL 28 09/11/2016 0252   LDLCALC 113 (H) 09/11/2016 0252    Other Studies Reviewed Today:  Echocardiogram 09/13/2016: Study Conclusions  - Left ventricle: The cavity size was normal. Wall thickness was   increased in a pattern of mild LVH. There was mild focal basal   hypertrophy of the septum. Systolic function was normal. The   estimated ejection fraction was in the range of 50% to 55%. Wall   motion was normal; there were no regional wall motion   abnormalities. - Aortic valve: Valve mobility was restricted. There was mild   stenosis. There was trivial regurgitation. - Mitral valve: Calcified  annulus.  Impressions:  - Normal LV systolic function; calcified aortic valve with mild AS   (mean gradient 11 mmHg); trace AI.  Carotid Dopplers 09/11/2016: IMPRESSION: 1. Bilateral carotid bifurcation and proximal ICA plaque, resulting in NEAR OCCLUSIVE STENOSIS of the left ICA, less than 50% diameter stenosis of the right ICA. Recommend vascular surgical or neurointerventional radiology consultation. 2.  Antegrade bilateral vertebral arterial flow.  Assessment and Plan:  1. Recent diagnosis of atrial fibrillation, persistent on amiodarone and Lopressor. CHADSVASC score is 5 and his risk of recurrent stroke is high off anticoagulation. As I put it to him today, this is a "between a rock and a hard place" situation. With his recent stool changes, plan is to initiate Protonix and have him hold Eliquis for the next week. We will then try back cautiously on Eliquis and stop his aspirin at that time. If he has further bleeding necessitating discontinuation of anticoagulation, we will send him back for GI evaluation and proceed from there. We will also have to defer plans for cardioversion since he is not anticoagulated. Requesting CBC from Dr. Delanna Ahmadi office.  2. Recent stroke with severe left carotid artery stenosis now status post CEA by Dr. Donzetta Matters. Patient reports improving left arm weakness.  3. Hyperlipidemia, on Lipitor and Zetia. Recent LDL 113.  4. Mild aortic stenosis by echocardiogram. Asymptomatic.  Current medicines were reviewed with the patient today.  Disposition: Follow-up in 3-4 weeks.  Signed, Satira Sark, MD, [facility] 09/26/2016 9:10 AM    [place] [facility] [address] [place], [place] [address] Phone: [phone]; Fax: [phone]

## 2016-09-26 ENCOUNTER — Encounter: Payer: Self-pay | Admitting: Cardiology

## 2016-09-26 ENCOUNTER — Ambulatory Visit (INDEPENDENT_AMBULATORY_CARE_PROVIDER_SITE_OTHER): Payer: Medicare Other | Admitting: Cardiology

## 2016-09-26 VITALS — BP 142/84 | HR 85 | Ht 74.0 in | Wt 205.0 lb

## 2016-09-26 DIAGNOSIS — I350 Nonrheumatic aortic (valve) stenosis: Secondary | ICD-10-CM | POA: Diagnosis not present

## 2016-09-26 DIAGNOSIS — I4819 Other persistent atrial fibrillation: Secondary | ICD-10-CM

## 2016-09-26 DIAGNOSIS — Z8673 Personal history of transient ischemic attack (TIA), and cerebral infarction without residual deficits: Secondary | ICD-10-CM | POA: Diagnosis not present

## 2016-09-26 DIAGNOSIS — E782 Mixed hyperlipidemia: Secondary | ICD-10-CM | POA: Diagnosis not present

## 2016-09-26 DIAGNOSIS — Z8719 Personal history of other diseases of the digestive system: Secondary | ICD-10-CM

## 2016-09-26 DIAGNOSIS — I6522 Occlusion and stenosis of left carotid artery: Secondary | ICD-10-CM | POA: Diagnosis not present

## 2016-09-26 DIAGNOSIS — I481 Persistent atrial fibrillation: Secondary | ICD-10-CM

## 2016-09-26 MED ORDER — PANTOPRAZOLE SODIUM 20 MG PO TBEC: 20.0000 mg | DELAYED_RELEASE_TABLET | Freq: Every day | ORAL | 3 refills | Status: DC

## 2016-09-26 NOTE — Patient Instructions (Signed)
Medication Instructions:  ON Monday ( FEBRUARY 19TH ) start back on ELIQUIS  STOP ASPIRIN ONCE YOU START BACK ON ELIQUIS  START PROTONIX 20 MG DAILY  Labwork: I WILL REQUEST A COPY OF YOUR LAB WORK FROM PCP  Testing/Procedures: NONE  Follow-Up: Your physician recommends that you schedule a follow-up appointment in: 3-4 WEEKS    Any Other Special Instructions Will Be Listed Below (If Applicable). IF YOU HAVE ANY PROBLEMS AFTER STARTING BACK ON THE ELIQUIS, PLEASE CALL TO INFORM DR. MCDOWELL!   If you need a refill on your cardiac medications before your next appointment, please call your pharmacy.

## 2016-09-27 ENCOUNTER — Telehealth: Payer: Self-pay | Admitting: Cardiology

## 2016-09-27 NOTE — Telephone Encounter (Signed)
Pt would like to speak w/ someone about his blood work please give him a call @ [phone]

## 2016-09-27 NOTE — Telephone Encounter (Signed)
Patient would like a call back after Dr. Domenic Polite has reviewed his lab results.

## 2016-10-01 ENCOUNTER — Telehealth: Payer: Self-pay | Admitting: Cardiology

## 2016-10-01 NOTE — Telephone Encounter (Signed)
Pt would like to wait another week before starting his Eliquis b/c he's not feeling good

## 2016-10-01 NOTE — Telephone Encounter (Signed)
Sent to Dr. McDowell as an FYI: 

## 2016-10-02 ENCOUNTER — Encounter (HOSPITAL_COMMUNITY): Payer: Self-pay | Admitting: Emergency Medicine

## 2016-10-02 ENCOUNTER — Emergency Department (HOSPITAL_COMMUNITY): Payer: Medicare Other

## 2016-10-02 ENCOUNTER — Observation Stay (HOSPITAL_COMMUNITY): Payer: Medicare Other

## 2016-10-02 ENCOUNTER — Observation Stay (HOSPITAL_COMMUNITY)
Admission: EM | Admit: 2016-10-02 | Discharge: 2016-10-03 | Disposition: A | Discharge status: Home / Self Care | Payer: Medicare Other | Attending: Internal Medicine | Admitting: Internal Medicine

## 2016-10-02 DIAGNOSIS — I6320 Cerebral infarction due to unspecified occlusion or stenosis of unspecified precerebral arteries: Secondary | ICD-10-CM | POA: Diagnosis present

## 2016-10-02 DIAGNOSIS — G5603 Carpal tunnel syndrome, bilateral upper limbs: Secondary | ICD-10-CM | POA: Diagnosis not present

## 2016-10-02 DIAGNOSIS — G459 Transient cerebral ischemic attack, unspecified: Secondary | ICD-10-CM | POA: Diagnosis not present

## 2016-10-02 DIAGNOSIS — Z87891 Personal history of nicotine dependence: Secondary | ICD-10-CM | POA: Diagnosis not present

## 2016-10-02 DIAGNOSIS — M79601 Pain in right arm: Secondary | ICD-10-CM | POA: Diagnosis not present

## 2016-10-02 DIAGNOSIS — M6281 Muscle weakness (generalized): Secondary | ICD-10-CM | POA: Diagnosis not present

## 2016-10-02 DIAGNOSIS — N183 Chronic kidney disease, stage 3 unspecified: Secondary | ICD-10-CM | POA: Diagnosis present

## 2016-10-02 DIAGNOSIS — I482 Chronic atrial fibrillation, unspecified: Secondary | ICD-10-CM | POA: Diagnosis present

## 2016-10-02 DIAGNOSIS — R202 Paresthesia of skin: Secondary | ICD-10-CM | POA: Diagnosis not present

## 2016-10-02 DIAGNOSIS — R29810 Facial weakness: Secondary | ICD-10-CM | POA: Insufficient documentation

## 2016-10-02 DIAGNOSIS — I129 Hypertensive chronic kidney disease with stage 1 through stage 4 chronic kidney disease, or unspecified chronic kidney disease: Secondary | ICD-10-CM | POA: Diagnosis not present

## 2016-10-02 DIAGNOSIS — M130 Polyarthritis, unspecified: Secondary | ICD-10-CM | POA: Diagnosis not present

## 2016-10-02 DIAGNOSIS — I6522 Occlusion and stenosis of left carotid artery: Secondary | ICD-10-CM | POA: Diagnosis not present

## 2016-10-02 DIAGNOSIS — Z79899 Other long term (current) drug therapy: Secondary | ICD-10-CM | POA: Insufficient documentation

## 2016-10-02 DIAGNOSIS — I4891 Unspecified atrial fibrillation: Secondary | ICD-10-CM | POA: Diagnosis not present

## 2016-10-02 DIAGNOSIS — R200 Anesthesia of skin: Secondary | ICD-10-CM | POA: Diagnosis not present

## 2016-10-02 DIAGNOSIS — I6529 Occlusion and stenosis of unspecified carotid artery: Secondary | ICD-10-CM | POA: Diagnosis present

## 2016-10-02 DIAGNOSIS — R29898 Other symptoms and signs involving the musculoskeletal system: Secondary | ICD-10-CM

## 2016-10-02 DIAGNOSIS — R51 Headache: Secondary | ICD-10-CM | POA: Diagnosis not present

## 2016-10-02 LAB — RAPID URINE DRUG SCREEN, HOSP PERFORMED
Amphetamines: NOT DETECTED
Barbiturates: NOT DETECTED
Benzodiazepines: NOT DETECTED
COCAINE: NOT DETECTED
OPIATES: NOT DETECTED
Tetrahydrocannabinol: NOT DETECTED

## 2016-10-02 LAB — DIFFERENTIAL
Basophils Absolute: 0.1 10*3/uL (ref 0.0–0.1)
Basophils Relative: 1 %
EOS ABS: 0.2 10*3/uL (ref 0.0–0.7)
Eosinophils Relative: 2 %
Lymphocytes Relative: 22 %
Lymphs Abs: 1.6 10*3/uL (ref 0.7–4.0)
Monocytes Absolute: 0.8 10*3/uL (ref 0.1–1.0)
Monocytes Relative: 11 %
NEUTROS PCT: 64 %
Neutro Abs: 4.9 10*3/uL (ref 1.7–7.7)

## 2016-10-02 LAB — ETHANOL: Alcohol, Ethyl (B): <5 mg/dL (ref <5)

## 2016-10-02 LAB — COMPREHENSIVE METABOLIC PANEL
ALBUMIN: 3.6 g/dL (ref 3.5–5.0)
ALT: 22 U/L (ref 17–63)
ANION GAP: 8 (ref 5–15)
AST: 21 U/L (ref 15–41)
Alkaline Phosphatase: 50 U/L (ref 38–126)
BUN: 19 mg/dL (ref 6–20)
CO2: 25 mmol/L (ref 22–32)
Calcium: 9.3 mg/dL (ref 8.9–10.3)
Chloride: 102 mmol/L (ref 101–111)
Creatinine, Ser: 1.35 mg/dL — ABNORMAL HIGH (ref 0.61–1.24)
GFR calc Af Amer: 53 mL/min — ABNORMAL LOW (ref >60)
GFR calc non Af Amer: 46 mL/min — ABNORMAL LOW (ref >60)
GLUCOSE: 121 mg/dL — AB (ref 65–99)
POTASSIUM: 4.1 mmol/L (ref 3.5–5.1)
SODIUM: 135 mmol/L (ref 135–145)
TOTAL PROTEIN: 6.6 g/dL (ref 6.5–8.1)
Total Bilirubin: 0.7 mg/dL (ref 0.3–1.2)

## 2016-10-02 LAB — I-STAT CHEM 8, ED
BUN: 18 mg/dL (ref 6–20)
Calcium, Ion: 1.21 mmol/L (ref 1.15–1.40)
Chloride: 102 mmol/L (ref 101–111)
Creatinine, Ser: 1.4 mg/dL — ABNORMAL HIGH (ref 0.61–1.24)
Glucose, Bld: 121 mg/dL — ABNORMAL HIGH (ref 65–99)
HEMATOCRIT: 32 % — AB (ref 39.0–52.0)
HEMOGLOBIN: 10.9 g/dL — AB (ref 13.0–17.0)
Potassium: 4.1 mmol/L (ref 3.5–5.1)
SODIUM: 138 mmol/L (ref 135–145)
TCO2: 24 mmol/L (ref 0–100)

## 2016-10-02 LAB — CBC
HCT: 34 % — ABNORMAL LOW (ref 39.0–52.0)
Hemoglobin: 11.2 g/dL — ABNORMAL LOW (ref 13.0–17.0)
MCH: 31.6 pg (ref 26.0–34.0)
MCHC: 32.9 g/dL (ref 30.0–36.0)
MCV: 96 fL (ref 78.0–100.0)
PLATELETS: 300 10*3/uL (ref 150–400)
RBC: 3.54 MIL/uL — ABNORMAL LOW (ref 4.22–5.81)
RDW: 14.1 % (ref 11.5–15.5)
WBC: 7.5 10*3/uL (ref 4.0–10.5)

## 2016-10-02 LAB — I-STAT TROPONIN, ED: Troponin i, poc: 0.02 ng/mL (ref 0.00–0.08)

## 2016-10-02 LAB — URINALYSIS, ROUTINE W REFLEX MICROSCOPIC
Bilirubin Urine: NEGATIVE
GLUCOSE, UA: NEGATIVE mg/dL
Hgb urine dipstick: NEGATIVE
KETONES UR: NEGATIVE mg/dL
LEUKOCYTES UA: NEGATIVE
Nitrite: NEGATIVE
PH: 5 (ref 5.0–8.0)
Protein, ur: NEGATIVE mg/dL
SPECIFIC GRAVITY, URINE: 1.012 (ref 1.005–1.030)

## 2016-10-02 LAB — PROTIME-INR
INR: 1.22
PROTHROMBIN TIME: 15.5 s — AB (ref 11.4–15.2)

## 2016-10-02 LAB — APTT: aPTT: 30 seconds (ref 24–36)

## 2016-10-02 MED ORDER — SODIUM CHLORIDE 0.9% FLUSH
3.0000 mL | Freq: Two times a day (BID) | INTRAVENOUS | Status: DC
  Administered 2016-10-03: 3 mL via INTRAVENOUS

## 2016-10-02 MED ORDER — METOPROLOL TARTRATE 25 MG PO TABS
25.0000 mg | ORAL_TABLET | Freq: Two times a day (BID) | ORAL | Status: DC
  Administered 2016-10-02 – 2016-10-03 (×2): 25 mg via ORAL
  Filled 2016-10-02 (×2): qty 1

## 2016-10-02 MED ORDER — ASPIRIN 81 MG PO CHEW
162.0000 mg | CHEWABLE_TABLET | Freq: Every day | ORAL | Status: DC
  Administered 2016-10-02 – 2016-10-03 (×2): 162 mg via ORAL
  Filled 2016-10-02 (×2): qty 2

## 2016-10-02 MED ORDER — ATORVASTATIN CALCIUM 40 MG PO TABS: 40.0000 mg | ORAL_TABLET | Freq: Every day | ORAL | Status: DC

## 2016-10-02 MED ORDER — AMIODARONE HCL 200 MG PO TABS
200.0000 mg | ORAL_TABLET | Freq: Every day | ORAL | Status: DC
  Administered 2016-10-03: 200 mg via ORAL
  Filled 2016-10-02: qty 1

## 2016-10-02 MED ORDER — SODIUM CHLORIDE 0.9 % IV SOLN: 250.0000 mL | INTRAVENOUS | Status: DC | PRN

## 2016-10-02 MED ORDER — SODIUM CHLORIDE 0.9% FLUSH: 3.0000 mL | INTRAVENOUS | Status: DC | PRN

## 2016-10-02 MED ORDER — ACETAMINOPHEN 650 MG RE SUPP: 650.0000 mg | Freq: Four times a day (QID) | RECTAL | Status: DC | PRN

## 2016-10-02 MED ORDER — APIXABAN 5 MG PO TABS
5.0000 mg | ORAL_TABLET | Freq: Two times a day (BID) | ORAL | Status: DC
Start: 2016-10-02 — End: 2016-10-03
  Administered 2016-10-02 – 2016-10-03 (×2): 5 mg via ORAL
  Filled 2016-10-02 (×2): qty 1

## 2016-10-02 MED ORDER — EZETIMIBE 10 MG PO TABS
10.0000 mg | ORAL_TABLET | Freq: Every day | ORAL | Status: DC
  Administered 2016-10-03: 10 mg via ORAL
  Filled 2016-10-02: qty 1

## 2016-10-02 MED ORDER — OMEGA-3-ACID ETHYL ESTERS 1 G PO CAPS
1.0000 g | ORAL_CAPSULE | Freq: Every day | ORAL | Status: DC
  Administered 2016-10-03: 1 g via ORAL
  Filled 2016-10-02: qty 1

## 2016-10-02 MED ORDER — OXYCODONE-ACETAMINOPHEN 5-325 MG PO TABS
1.0000 | ORAL_TABLET | ORAL | Status: DC | PRN
  Administered 2016-10-03: 2 via ORAL
  Filled 2016-10-02: qty 2

## 2016-10-02 MED ORDER — SODIUM CHLORIDE 0.9% FLUSH
3.0000 mL | Freq: Two times a day (BID) | INTRAVENOUS | Status: DC
  Administered 2016-10-02: 3 mL via INTRAVENOUS

## 2016-10-02 MED ORDER — STROKE: EARLY STAGES OF RECOVERY BOOK
Freq: Once | Status: DC
  Filled 2016-10-02: qty 1

## 2016-10-02 MED ORDER — PANTOPRAZOLE SODIUM 20 MG PO TBEC
20.0000 mg | DELAYED_RELEASE_TABLET | Freq: Every day | ORAL | Status: DC
  Filled 2016-10-02: qty 1

## 2016-10-02 MED ORDER — ACETAMINOPHEN 325 MG PO TABS: 650.0000 mg | ORAL_TABLET | Freq: Four times a day (QID) | ORAL | Status: DC | PRN

## 2016-10-02 MED ORDER — ALLOPURINOL 300 MG PO TABS
300.0000 mg | ORAL_TABLET | Freq: Every day | ORAL | Status: DC
  Administered 2016-10-03: 300 mg via ORAL
  Filled 2016-10-02: qty 1

## 2016-10-02 NOTE — ED Provider Notes (Signed)
[place] DEPT Provider Note   CSN: [id number] Arrival date & time: 10/02/16  1020  By signing my name below, I, Collene Leyden, attest that this documentation has been prepared under the direction and in the presence of Noemi Chapel, MD. Electronically Signed: Collene Leyden, Scribe. 10/02/16. 10:58 AM.  History   Chief Complaint Chief Complaint  Patient presents with  . Arm Injury    HPI Comments: Ronald Colon is a 83 y.o. male with a history of a stroke, who presents to the Emergency Department complaining of sudden-onset, right-sided arm numbness. Patient reports having a stroke in the last 3 weeks. Patient left AMA, but followed up. Patient underwent a LEFT SIDED CEA, the patient is back on eliquis as of last night. Patient woke up this morning with persistent left arm pain and numbness. Patient has no associated speech or leg problems. Patient states his symptoms are similar to prior stroke with right-sided weakness and numbness. Family member in the room endorses increased right-sided facial droop. Patient reports associated headache and right arm pain. Patient states he ambulates with a walker since last stroke where his weakness remains.   HPI  Past Medical History:  Diagnosis Date  . Aortic stenosis    Mild February 2018  . Arthritis   . Atrial fibrillation [facility])    Diagnosed January 2018  . Carotid stenosis   . Colon polyp   . Diverticulosis   . Essential hypertension, benign   . Gout   . Hyperlipidemia   . Rheumatic fever 1937  . Stroke [facility]) 09/10/2016    Patient Active Problem List   Diagnosis Date Noted  . Cerebral infarction due to occlusion or stenosis of precerebral artery ([place])   . Hyperlipidemia   . New onset atrial fibrillation ([place])   . Anticoagulation adequate   . Elevated troponin   . Carotid stenosis 09/12/2016  . Cerebrovascular accident (CVA) due to stenosis of left middle cerebral artery ([place]) 09/10/2016  . Hyperglycemia 09/10/2016  . CKD  (chronic kidney disease), stage III 09/10/2016  . GERD (gastroesophageal reflux disease) 08/24/2013  . Heart murmur 08/18/2013  . Irregular heartbeat 08/18/2013  . Essential hypertension, benign 08/18/2013    Past Surgical History:  Procedure Laterality Date  . CATARACT EXTRACTION W/PHACO Right 08/02/2014   Procedure: CATARACT EXTRACTION PHACO AND INTRAOCULAR LENS PLACEMENT; CDE:  12.77;  Surgeon: Williams Che, MD;  Location: [facility];  Service: Ophthalmology;  Laterality: Right;  . CATARACT EXTRACTION W/PHACO Left 02/15/2015   Procedure: CATARACT EXTRACTION PHACO AND INTRAOCULAR LENS PLACEMENT (IOC);  Surgeon: Williams Che, MD;  Location: [facility];  Service: Ophthalmology;  Laterality: Left;  CDE 18.00  . COLONOSCOPY    . ELBOW SURGERY Right    "grissle in there"  . ENDARTERECTOMY Left 09/17/2016   Procedure: LEFT CAROTID ENDARTECTOMY;  Surgeon: Waynetta Sandy, MD;  Location: [place];  Service: Vascular;  Laterality: Left;  . ESOPHAGEAL DILATION  09/03/2013   Procedure: ESOPHAGEAL DILATION;  Surgeon: Rogene Houston, MD;  [facility];  Service: Endoscopy;;  . ESOPHAGOGASTRODUODENOSCOPY N/A 09/03/2013   Procedure: ESOPHAGOGASTRODUODENOSCOPY (EGD);  Surgeon: Rogene Houston, MD;  [facility];  Service: Endoscopy;  Laterality: N/A;  255-rescheduled to 1030 Ann to notify pt  . PATCH ANGIOPLASTY Left 09/17/2016   Procedure: PATCH ANGIOPLASTY WITH Rueben Bash BIOLOGIC PATCH 1 CM X6 CM;  Surgeon: Waynetta Sandy, MD;  Location: [place];  Service: Vascular;  Laterality: Left;  Home Medications    Prior to Admission medications   Medication Sig Start Date End Date Taking? Authorizing Provider  allopurinol (ZYLOPRIM) 300 MG tablet Take 300 mg by mouth daily.    Historical Provider, MD  amiodarone (PACERONE) 200 MG tablet Take 1 tablet (200 mg total) by mouth daily. 09/20/16   Alvia Grove, PA-C  apixaban (ELIQUIS) 5 MG TABS tablet Take 1 tablet (5 mg  total) by mouth 2 (two) times daily. 09/19/16   Alvia Grove, PA-C  apixaban (ELIQUIS) 5 MG TABS tablet Take 1 tablet (5 mg total) by mouth 2 (two) times daily. 10/17/16   Alvia Grove, PA-C  aspirin EC 81 MG tablet Take 1 tablet (81 mg total) by mouth daily. 09/11/16   Erline Hau, MD  Aspirin-Salicylamide-Caffeine (BC HEADACHE) [phone] MG TABS Take 1 packet by mouth daily as needed (for pain).    Historical Provider, MD  atorvastatin (LIPITOR) 40 MG tablet Take 1 tablet (40 mg total) by mouth daily at 6 PM. 09/19/16   Alvia Grove, PA-C  ezetimibe (ZETIA) 10 MG tablet Take 10 mg by mouth daily.    Historical Provider, MD  fish oil-omega-3 fatty acids 1000 MG capsule Take 1 g by mouth daily.    Historical Provider, MD  metoprolol tartrate (LOPRESSOR) 25 MG tablet Take 1 tablet (25 mg total) by mouth 2 (two) times daily. 09/19/16   Alvia Grove, PA-C  oxyCODONE-acetaminophen (PERCOCET/ROXICET) 5-325 MG tablet Take 1-2 tablets by mouth every 4 (four) hours as needed for moderate pain. 09/19/16   Alvia Grove, PA-C  pantoprazole (PROTONIX) 20 MG tablet Take 1 tablet (20 mg total) by mouth daily. 09/26/16   Satira Sark, MD    Family History Family History  Problem Relation Age of Onset  . CVA Mother 21  . Pneumonia Father 73  . Colon cancer Neg Hx     Social History Social History  Substance Use Topics  . Smoking status: Former Smoker    Packs/day: 1.00    Years: 20.00    Types: Cigarettes    Quit date: 46  . Smokeless tobacco: Never Used  . Alcohol use No     Allergies   No known allergies   Review of Systems Review of Systems   Physical Exam Updated Vital Signs BP 127/77   Pulse 102   Temp 97.9 F (36.6 C)   Resp 18   Ht 6\' 2"  (1.88 m)   Wt 205 lb (93 kg)   SpO2 99%   BMI 26.32 kg/m   Physical Exam  Constitutional: He is oriented to person, place, and time. He appears well-developed.  HENT:  Head: Normocephalic and atraumatic.    Mouth/Throat: Oropharynx is clear and moist.  Eyes: Conjunctivae and EOM are normal. Pupils are equal, round, and reactive to light.  Neck: Normal range of motion. Neck supple.  Cardiovascular: Normal rate.   Atrial fibrillation, rate controlled. Strong pulses.   Pulmonary/Chest: Effort normal.  Abdominal: Soft. Bowel sounds are normal.  Musculoskeletal: He exhibits no edema, tenderness or deformity.  Neurological: He is alert and oriented to person, place, and time.  Right-sided facial droop. Sensory deficit to the right side. Normal FNF.  No focal strength defecit noted to the RUE or LUE.   Skin: Skin is warm and dry.     ED Treatments / Results  DIAGNOSTIC STUDIES: Oxygen Saturation is 99% on FRA, normal by my interpretation.    COORDINATION OF CARE:  10:52 AM Discussed treatment plan with pt at bedside and pt agreed to plan  Labs (all labs ordered are listed, but only abnormal results are displayed) Labs Reviewed - No data to display  EKG  EKG Interpretation  Date/Time:  Tuesday October 02 2016 10:30:45 EST Ventricular Rate:  91 PR Interval:    QRS Duration: 138 QT Interval:  428 QTC Calculation: 527 R Axis:   -63 Text Interpretation:  Atrial fibrillation Nonspecific IVCD with LAD Left ventricular hypertrophy Baseline wander in lead(s) V1 V4 V5 V6 Since last tracing rate slower Confirmed by Gyan Cambre  MD, Christl Fessenden ([id number]) on 10/02/2016 11:46:12 AM       Radiology No results found.  Procedures Procedures (including critical care time)  Medications Ordered in ED Medications - No data to display   Initial Impression / Assessment and Plan / ED Course  I have reviewed the triage vital signs and the nursing notes.  Pertinent labs & imaging results that were available during my care of the patient were reviewed by me and considered in my medical decision making (see chart for details).      The patient has risk for stroke, has recently had a stroke and has a fresh  carotid endarterectomy with worsening symptoms. Discussed with the neurologist, discussed with the patient, the patient would best be treated inpatient on observation.  Hospitalist to admit.  Final Clinical Impressions(s) / ED Diagnoses   Final diagnoses:  Right arm numbness  Left arm weakness    New Prescriptions New Prescriptions   No medications on file   I personally performed the services described in this documentation, which was scribed in my presence. The recorded information has been reviewed and is accurate.        Noemi Chapel, MD 10/04/16 [phone]

## 2016-10-02 NOTE — H&P (Signed)
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 EDP discussed with neurology who recommended repeat MRI and observation overnight.  Patient is a good historian. He has done well since discharge from his recent surgery. He did have some bleeding at home and anticoagulation was temporarily discontinued, only being restarted 2/19. This morning when he awoke he had numbness radiating from the shoulder down to the hand. Strength intact and without change. Right shoulder painful. No specific aggravating or alleviating factors. Numbness lasted for several hours and spontaneously resolved. Currently feels much better and has no complaints. No difficulty speaking or swallowing. Reports shoulder pain is chronic. No difficulty using right arm.  ED Course: Afebrile, vital signs stable . Pertinent labs: Basic metabolic panel unremarkable, troponin negative, hemoglobin 11.2, remainder CBC unremarkable. Urinalysis negative. Urine drug screen and alcohol level negative. Imaging: CT head no acute hemorrhage. Subacute left frontal parietal lobe infarcts without CT evidence to suggest extension.  Review of Systems:  Negative for fever, visual changes, sore throat, rash, new muscle aches, chest pain, SOB, dysuria, bleeding, n/v/abdominal pain.  Past Medical History:  Diagnosis Date  . Aortic stenosis    Mild February 2018  . Arthritis   . Atrial fibrillation [facility])    Diagnosed January 2018  . Carotid stenosis   . Colon polyp   . Diverticulosis   . Essential hypertension, benign     . Gout   . Hyperlipidemia   . Rheumatic fever 1937  . Stroke [facility]) 09/10/2016    Past Surgical History:  Procedure Laterality Date  . CATARACT EXTRACTION W/PHACO Right 08/02/2014   Procedure: CATARACT EXTRACTION PHACO AND INTRAOCULAR LENS PLACEMENT; CDE:  12.77;  Surgeon: Williams Che, MD;  Location: [facility];  Service: Ophthalmology;  Laterality: Right;  . CATARACT EXTRACTION W/PHACO Left 02/15/2015   Procedure: CATARACT EXTRACTION PHACO AND INTRAOCULAR LENS PLACEMENT (IOC);  Surgeon: Williams Che, MD;  Location: [facility];  Service: Ophthalmology;  Laterality: Left;  CDE 18.00  . COLONOSCOPY    . ELBOW SURGERY Right    "grissle in there"  . ENDARTERECTOMY Left 09/17/2016   Procedure: LEFT CAROTID ENDARTECTOMY;  Surgeon: Waynetta Sandy, MD;  Location: [place];  Service: Vascular;  Laterality: Left;  . ESOPHAGEAL DILATION  09/03/2013   Procedure: ESOPHAGEAL DILATION;  Surgeon: Rogene Houston, MD;  Location: [facility];  Service: Endoscopy;;  . ESOPHAGOGASTRODUODENOSCOPY N/A 09/03/2013   Procedure: ESOPHAGOGASTRODUODENOSCOPY (EGD);  Surgeon: Rogene Houston, MD;  Location: [facility];  Service: Endoscopy;  Laterality: N/A;  255-rescheduled to 1030 Ann to notify pt  . PATCH ANGIOPLASTY Left 09/17/2016   Procedure: PATCH ANGIOPLASTY WITH Rueben Bash BIOLOGIC PATCH 1 CM X6 CM;  Surgeon: Waynetta Sandy, MD;  Location: [place];  Service: Vascular;  Laterality: Left;     reports that he quit smoking about 38 years ago. His smoking use included Cigarettes. He has a 20.00 pack-year smoking history. He has never used smokeless tobacco. He reports that he does not drink alcohol or use drugs.   Allergies  Allergen Reactions  . No Known Allergies  Family History  Problem Relation Age of Onset  . CVA Mother 67  . Pneumonia Father 32  . Colon cancer Neg Hx      Prior to Admission medications   Medication Sig Start Date End Date Taking? Authorizing Provider  allopurinol  (ZYLOPRIM) 300 MG tablet Take 300 mg by mouth daily.   Yes Historical Provider, MD  amiodarone (PACERONE) 200 MG tablet Take 1 tablet (200 mg total) by mouth daily. 09/20/16  Yes Alvia Grove, PA-C  apixaban (ELIQUIS) 5 MG TABS tablet Take 1 tablet (5 mg total) by mouth 2 (two) times daily. 09/19/16  Yes Alvia Grove, PA-C  atorvastatin (LIPITOR) 40 MG tablet Take 1 tablet (40 mg total) by mouth daily at 6 PM. 09/19/16  Yes Alvia Grove, PA-C  ezetimibe (ZETIA) 10 MG tablet Take 10 mg by mouth daily.   Yes Historical Provider, MD  ferrous sulfate 325 (65 FE) MG tablet Take 325 mg by mouth 2 (two) times daily with a meal.   Yes Historical Provider, MD  fish oil-omega-3 fatty acids 1000 MG capsule Take 1 g by mouth daily.   Yes Historical Provider, MD  metoprolol tartrate (LOPRESSOR) 25 MG tablet Take 1 tablet (25 mg total) by mouth 2 (two) times daily. 09/19/16  Yes Alvia Grove, PA-C  oxyCODONE-acetaminophen (PERCOCET/ROXICET) 5-325 MG tablet Take 1-2 tablets by mouth every 4 (four) hours as needed for moderate pain. 09/19/16  Yes Alvia Grove, PA-C  pantoprazole (PROTONIX) 20 MG tablet Take 1 tablet (20 mg total) by mouth daily. 09/26/16  Yes Satira Sark, MD    Physical Exam: Vitals:   10/02/16 1200 10/02/16 1330 10/02/16 1500 10/02/16 1504  BP: 136/91 115/71 120/65   Pulse: (!) 47 74  93  Resp:      Temp:      SpO2: 99% 98%  99%  Weight:      Height:        Constitutional:  . Appears calm and comfortable Eyes:  . Pupils, irises, lids appear unremarkable. ENMT:  . Grossly normal hearing. Lips and tongue appear unremarkable. Neck:  . No lymphadenopathy or masses. No thyromegaly. Respiratory:  . CTA bilaterally, no w/r/r.  . Respiratory effort normal Cardiovascular:  . RRR, no m/r/g . No LE extremity edema   Abdomen:  . Soft, nontender, nondistended Musculoskeletal:  . Left upper and left lower extremity strength 5/5. Right upper and right lower extremity  strength 5/5. No pronator drift. Sensation grossly intact. Perhaps subtle right wrist had akinesis. Skin:  . No rashes, lesions, ulcers . palpation of skin: no induration or nodules Neurologic:  . Left facial weakness noted which patient reports is chronic. Psychiatric:  . judgement and insight appear normal . Mental status o Mood, affect appropriate  Wt Readings from Last 3 Encounters:  10/02/16 93 kg (205 lb)  09/26/16 93 kg (205 lb)  09/17/16 91.7 kg (202 lb 1.6 oz)    I have personally reviewed following labs and imaging studies  Labs on Admission:  CBC:  Recent Labs Lab 10/02/16 1115 10/02/16 1130  WBC 7.5  --   NEUTROABS 4.9  --   HGB 11.2* 10.9*  HCT 34.0* 32.0*  MCV 96.0  --   PLT 300  --    Basic Metabolic Panel:  Recent Labs Lab 10/02/16 1115 10/02/16 1130  NA 135 138  K 4.1 4.1  CL 102 102  CO2 25  --   GLUCOSE 121* 121*  BUN 19  18  CREATININE 1.35* 1.40*  CALCIUM 9.3  --    Liver Function Tests:  Recent Labs Lab 10/02/16 1115  AST 21  ALT 22  ALKPHOS 50  BILITOT 0.7  PROT 6.6  ALBUMIN 3.6   Coagulation Profile:  Recent Labs Lab 10/02/16 1115  INR 1.22   Urine analysis:    Component Value Date/Time   COLORURINE YELLOW 10/02/2016 1048   APPEARANCEUR CLEAR 10/02/2016 1048   LABSPEC 1.012 10/02/2016 1048   PHURINE 5.0 10/02/2016 1048   GLUCOSEU NEGATIVE 10/02/2016 1048   HGBUR NEGATIVE 10/02/2016 1048   BILIRUBINUR NEGATIVE 10/02/2016 1048   KETONESUR NEGATIVE 10/02/2016 1048   PROTEINUR NEGATIVE 10/02/2016 1048   NITRITE NEGATIVE 10/02/2016 1048   LEUKOCYTESUR NEGATIVE 10/02/2016 1048    Radiological Exams on Admission: Ct Head Wo Contrast  Result Date: 10/02/2016 CLINICAL DATA:  83 year old male with new right arm weakness and headache. CVA 3 weeks ago. Hypertension. Atrial fibrillation. Initial encounter. EXAM: CT HEAD WITHOUT CONTRAST TECHNIQUE: Contiguous axial images were obtained from the base of the skull through  the vertex without intravenous contrast. COMPARISON:  09/11/2016 brain MR.  09/10/2016 head CT. FINDINGS: Brain: No intracranial hemorrhage. Subacute left frontal-parietal lobe infarcts without CT evidence to suggest extension. This can be addressed by MR if clinically desired. Remote small left caudate body infarct. Chronic microvascular changes. Global atrophy without hydrocephalus. No intracranial mass lesion noted on this unenhanced exam. Vascular: Vascular calcifications. Skull: Negative Sinuses/Orbits: Post lens replacement. Exophthalmos. No acute orbital abnormality. Minimal mucosal thickening right maxillary sinus. Other: Coarse calcification undersurface of the corpus callosum unchanged. IMPRESSION: No intracranial hemorrhage. Subacute left frontal-parietal lobe infarcts without CT evidence to suggest extension. This can be addressed by MR if clinically desired. Remote small left caudate body infarct. Electronically Signed   By: Genia Del M.D.   On: 10/02/2016 12:18    EKG: Independently reviewed: Atrial fibrillation, nonspecific intraventricular conduction delay, left axis deviation. No acute changes.  Principal Problem:   Right arm numbness Active Problems:   CKD (chronic kidney disease), stage III   Carotid stenosis   New onset atrial fibrillation (HCC)   Cerebral infarction due to occlusion or stenosis of precerebral artery (HCC)   Assessment/Plan 1. Right arm numbness, now resolved. Patient reports left facial weakness is at baseline. Strength of the arm is at baseline as well. No definite new deficits. Cannot exclude TIA versus extension of stroke, especially as the patient was off anticoagulation until yesterday. 2. PMH stroke 08/2016 with facial droop and right arm numbness. Status post carotid endarterectomy 09/2016. Incision over the left neck appears to be healing well. 3. Atrial fibrillation. Rate controlled. Hemodynamics stable. Continue Elocon as. 4. Chronic kidney disease  stage III, at baseline 5. Normocytic anemia, at baseline  It is my clinical opinion that referral for OBSERVATION is reasonable and necessary in this patient  The aforementioned taken together are felt to place the patient at high risk for further clinical deterioration. However it is anticipated that the patient may be medically stable for discharge from the hospital within 24 to 48 hours.   DVT prophylaxis:Eliquis Code Status: full code Family Communication: none   Time spent: 50 minutes  Murray Hodgkins, MD  [facility] Direct contact: [phone] --[place]ia [place]  --[email or web address]; password [email or web address]  7PM-7AM contact night coverage as above  10/02/2016, 3:58 PM

## 2016-10-02 NOTE — ED Triage Notes (Signed)
Pt c/o right arm pain and numbness upon waking this am. Pt reports he had stroke affecting right side x 3 weeks ago and had recent left carotid surgery. nad noted.

## 2016-10-02 NOTE — Consult Note (Signed)
Ronald A. Merlene Laughter, MD     [email or web address]          Ronald Colon is an 83 y.o. male.   ASSESSMENT/PLAN: 1. Acute numbness tingling and pain involving the right upper extremity of unclear etiology. My gestalt is that this is a TIA. There is significant pain and numbness associated with his symptoms and therefore a musculoskeletal etiology or peripheral neuropathy/ cervical radiculopathy may be an issue. He does have a past history of carpal tunnel syndrome status post bilateral carpal tunnel release surgery. The patient appears to have been off anticoagulation and antiplatelet agent at the time of this event. He was just started back on anticoagulation last night. This was held because of melena. I would recommend giving the patient a dose or 2 of the aspirin until his anticoagulation becomes therapeutic. Repeat carotid duplex Doppler is also recommended.  2. Atrial fibrillation.  3. Bilateral carpal tunnel syndrome.  4. History of colonic polyps.    The patient is an 83 year old right-handed white male who had a stroke about 3 weeks ago presenting at that time with a fascia, dizziness, ataxia and right-sided numbness and tingling. The workup at that time was negative for critical stenosis of the left ICA with a velocity of 661. The patient underwent left carotid endarterectomy about 2 weeks ago. It appears that he was on IV heparin and then was transitioned to anticoagulation because of newly diagnosed atrial fibrillation. He developed melena on the anticoagulation. This was held for about a week and was restarted last night. The patient woke up this morning with significant pain, numbness and tingling involving the entire right upper extremity from the shoulders downward. He denies recurrent aphasia, dizziness or gait ataxia. He denies any chest pain or shortness of breath. He did have a mild frontal headache which lasted about an hour or so. The patient has had no  recurrent melena. The review systems otherwise negative.   GENERAL: This is a pleasant thin gentleman in no acute distress.  HEENT: He is status post left carotid endarterectomy with some swelling involving the left side of the neck.  ABDOMEN: soft  EXTREMITIES: No edema   BACK: Normal   SKIN: Normal by inspection.    MENTAL STATUS: Alert and oriented including orientation to month and age.  Today is his birthday. Speech, language and cognition are generally intact. Judgment and insight normal.   CRANIAL NERVES: Pupils are equal, round and reactive to light and accomodation; extra ocular movements are full, there is no significant nystagmus; visual fields are full; upper and lower facial muscles are normal in strength and mostly symmetric with some mild weakness involving the lower facial muscles of the lip / orbicularis oris on the left side, there is no flattening of the nasolabial folds; tongue is midline; uvula is midline; shoulder elevation is normal.   MOTOR: Normal tone, bulk and strength; no pronator drift. There appears to be a mild drift of the right leg. No drift of the right upper extremity or left side.  COORDINATION: Left finger to nose is normal, right finger to nose is normal, No rest tremor; no intention tremor; no postural tremor; no bradykinesia.  REFLEXES: Deep tendon reflexes are symmetrical and normal.   SENSATION: Normal to light touch, temperature, and pinprick. There is no tactile or visual extinction on double simultaneous stimulation.   NIH stroke scale 2.   Blood pressure (!) 157/91, pulse 100, temperature 97.9 F (36.6 C), temperature source Oral, resp. rate  16, height 6' 2" (1.88 m), weight 201 lb 12.8 oz (91.5 kg), SpO2 100 %.  Past Medical History:  Diagnosis Date  . Aortic stenosis    Mild February 2018  . Arthritis   . Atrial fibrillation [facility])    Diagnosed January 2018  . Carotid stenosis   . Colon polyp   . Diverticulosis   . Essential  hypertension, benign   . Gout   . Hyperlipidemia   . Rheumatic fever 1937  . Stroke [facility]) 09/10/2016    Past Surgical History:  Procedure Laterality Date  . CATARACT EXTRACTION W/PHACO Right 08/02/2014   Procedure: CATARACT EXTRACTION PHACO AND INTRAOCULAR LENS PLACEMENT; CDE:  12.77;  Surgeon: Williams Che, MD;  Location: [facility];  Service: Ophthalmology;  Laterality: Right;  . CATARACT EXTRACTION W/PHACO Left 02/15/2015   Procedure: CATARACT EXTRACTION PHACO AND INTRAOCULAR LENS PLACEMENT (IOC);  Surgeon: Williams Che, MD;  Location: [facility];  Service: Ophthalmology;  Laterality: Left;  CDE 18.00  . COLONOSCOPY    . ELBOW SURGERY Right    "grissle in there"  . ENDARTERECTOMY Left 09/17/2016   Procedure: LEFT CAROTID ENDARTECTOMY;  Surgeon: Waynetta Sandy, MD;  Location: [place];  Service: Vascular;  Laterality: Left;  . ESOPHAGEAL DILATION  09/03/2013   Procedure: ESOPHAGEAL DILATION;  Surgeon: Rogene Houston, MD;  Location: [facility];  Service: Endoscopy;;  . ESOPHAGOGASTRODUODENOSCOPY N/A 09/03/2013   Procedure: ESOPHAGOGASTRODUODENOSCOPY (EGD);  Surgeon: Rogene Houston, MD;  Location: [facility];  Service: Endoscopy;  Laterality: N/A;  255-rescheduled to 1030 Ann to notify pt  . PATCH ANGIOPLASTY Left 09/17/2016   Procedure: PATCH ANGIOPLASTY WITH Rueben Bash BIOLOGIC PATCH 1 CM X6 CM;  Surgeon: Waynetta Sandy, MD;  Location: [facility] OR;  Service: Vascular;  Laterality: Left;    Family History  Problem Relation Age of Onset  . CVA Mother 26  . Pneumonia Father 54  . Colon cancer Neg Hx     Social History:  reports that he quit smoking about 38 years ago. His smoking use included Cigarettes. He has a 20.00 pack-year smoking history. He has never used smokeless tobacco. He reports that he does not drink alcohol or use drugs.  Allergies:  Allergies  Allergen Reactions  . No Known Allergies     Medications: Prior to Admission medications   Medication Sig  Start Date End Date Taking? Authorizing Provider  allopurinol (ZYLOPRIM) 300 MG tablet Take 300 mg by mouth daily.   Yes Historical Provider, MD  amiodarone (PACERONE) 200 MG tablet Take 1 tablet (200 mg total) by mouth daily. 09/20/16  Yes Alvia Grove, PA-C  apixaban (ELIQUIS) 5 MG TABS tablet Take 1 tablet (5 mg total) by mouth 2 (two) times daily. 09/19/16  Yes Alvia Grove, PA-C  atorvastatin (LIPITOR) 40 MG tablet Take 1 tablet (40 mg total) by mouth daily at 6 PM. 09/19/16  Yes Alvia Grove, PA-C  ezetimibe (ZETIA) 10 MG tablet Take 10 mg by mouth daily.   Yes Historical Provider, MD  ferrous sulfate 325 (65 FE) MG tablet Take 325 mg by mouth 2 (two) times daily with a meal.   Yes Historical Provider, MD  fish oil-omega-3 fatty acids 1000 MG capsule Take 1 g by mouth daily.   Yes Historical Provider, MD  metoprolol tartrate (LOPRESSOR) 25 MG tablet Take 1 tablet (25 mg total) by mouth 2 (two) times daily. 09/19/16  Yes Alvia Grove, PA-C  oxyCODONE-acetaminophen (PERCOCET/ROXICET) 5-325 MG tablet  Take 1-2 tablets by mouth every 4 (four) hours as needed for moderate pain. 09/19/16  Yes Alvia Grove, PA-C  pantoprazole (PROTONIX) 20 MG tablet Take 1 tablet (20 mg total) by mouth daily. 09/26/16  Yes Satira Sark, MD    Scheduled Meds: .  stroke: mapping our early stages of recovery book   Does not apply Once  . [START ON 10/03/2016] allopurinol  300 mg Oral Daily  . [START ON 10/03/2016] amiodarone  200 mg Oral Daily  . apixaban  5 mg Oral BID  . atorvastatin  40 mg Oral q1800  . [START ON 10/03/2016] ezetimibe  10 mg Oral Daily  . metoprolol tartrate  25 mg Oral BID  . [START ON 10/03/2016] omega-3 acid ethyl esters  1 g Oral Daily  . [START ON 10/03/2016] pantoprazole  20 mg Oral Daily  . sodium chloride flush  3 mL Intravenous Q12H  . sodium chloride flush  3 mL Intravenous Q12H   Continuous Infusions: PRN Meds:.sodium chloride, acetaminophen **OR** acetaminophen,  oxyCODONE-acetaminophen, sodium chloride flush     Results for orders placed or performed during the hospital encounter of 10/02/16 (from the past 48 hour(s))  Urine rapid drug screen (hosp performed)not at [facility]     Status: None   Collection Time: 10/02/16 10:48 AM  Result Value Ref Range   Opiates NONE DETECTED NONE DETECTED   Cocaine NONE DETECTED NONE DETECTED   Benzodiazepines NONE DETECTED NONE DETECTED   Amphetamines NONE DETECTED NONE DETECTED   Tetrahydrocannabinol NONE DETECTED NONE DETECTED   Barbiturates NONE DETECTED NONE DETECTED    Comment:        DRUG SCREEN FOR MEDICAL PURPOSES ONLY.  IF CONFIRMATION IS NEEDED FOR ANY PURPOSE, NOTIFY LAB WITHIN 5 DAYS.        LOWEST DETECTABLE LIMITS FOR URINE DRUG SCREEN Drug Class       Cutoff (ng/mL) Amphetamine      1000 Barbiturate      200 Benzodiazepine   242 Tricyclics       353 Opiates          300 Cocaine          300 THC              50   Urinalysis, Routine w reflex microscopic     Status: None   Collection Time: 10/02/16 10:48 AM  Result Value Ref Range   Color, Urine YELLOW YELLOW   APPearance CLEAR CLEAR   Specific Gravity, Urine 1.012 1.005 - 1.030   pH 5.0 5.0 - 8.0   Glucose, UA NEGATIVE NEGATIVE mg/dL   Hgb urine dipstick NEGATIVE NEGATIVE   Bilirubin Urine NEGATIVE NEGATIVE   Ketones, ur NEGATIVE NEGATIVE mg/dL   Protein, ur NEGATIVE NEGATIVE mg/dL   Nitrite NEGATIVE NEGATIVE   Leukocytes, UA NEGATIVE NEGATIVE  Ethanol     Status: None   Collection Time: 10/02/16 11:15 AM  Result Value Ref Range   Alcohol, Ethyl (B) <5 <5 mg/dL    Comment:        LOWEST DETECTABLE LIMIT FOR SERUM ALCOHOL IS 5 mg/dL FOR MEDICAL PURPOSES ONLY   Protime-INR     Status: Abnormal   Collection Time: 10/02/16 11:15 AM  Result Value Ref Range   Prothrombin Time 15.5 (H) 11.4 - 15.2 seconds   INR 1.22   APTT     Status: None   Collection Time: 10/02/16 11:15 AM  Result Value Ref Range   aPTT 30 24 -  36 seconds    CBC     Status: Abnormal   Collection Time: 10/02/16 11:15 AM  Result Value Ref Range   WBC 7.5 4.0 - 10.5 K/uL   RBC 3.54 (L) 4.22 - 5.81 MIL/uL   Hemoglobin 11.2 (L) 13.0 - 17.0 g/dL   HCT 34.0 (L) 39.0 - 52.0 %   MCV 96.0 78.0 - 100.0 fL   MCH 31.6 26.0 - 34.0 pg   MCHC 32.9 30.0 - 36.0 g/dL   RDW 14.1 11.5 - 15.5 %   Platelets 300 150 - 400 K/uL  Differential     Status: None   Collection Time: 10/02/16 11:15 AM  Result Value Ref Range   Neutrophils Relative % 64 %   Neutro Abs 4.9 1.7 - 7.7 K/uL   Lymphocytes Relative 22 %   Lymphs Abs 1.6 0.7 - 4.0 K/uL   Monocytes Relative 11 %   Monocytes Absolute 0.8 0.1 - 1.0 K/uL   Eosinophils Relative 2 %   Eosinophils Absolute 0.2 0.0 - 0.7 K/uL   Basophils Relative 1 %   Basophils Absolute 0.1 0.0 - 0.1 K/uL  Comprehensive metabolic panel     Status: Abnormal   Collection Time: 10/02/16 11:15 AM  Result Value Ref Range   Sodium 135 135 - 145 mmol/L   Potassium 4.1 3.5 - 5.1 mmol/L   Chloride 102 101 - 111 mmol/L   CO2 25 22 - 32 mmol/L   Glucose, Bld 121 (H) 65 - 99 mg/dL   BUN 19 6 - 20 mg/dL   Creatinine, Ser 1.35 (H) 0.61 - 1.24 mg/dL   Calcium 9.3 8.9 - 10.3 mg/dL   Total Protein 6.6 6.5 - 8.1 g/dL   Albumin 3.6 3.5 - 5.0 g/dL   AST 21 15 - 41 U/L   ALT 22 17 - 63 U/L   Alkaline Phosphatase 50 38 - 126 U/L   Total Bilirubin 0.7 0.3 - 1.2 mg/dL   GFR calc non Af Amer 46 (L) >60 mL/min   GFR calc Af Amer 53 (L) >60 mL/min    Comment: (NOTE) The eGFR has been calculated using the CKD EPI equation. This calculation has not been validated in all clinical situations. eGFR's persistently <60 mL/min signify possible Chronic Kidney Disease.    Anion gap 8 5 - 15  I-stat troponin, ED (not at [facility])     Status: None   Collection Time: 10/02/16 11:28 AM  Result Value Ref Range   Troponin i, poc 0.02 0.00 - 0.08 ng/mL   Comment 3            Comment: Due to the release kinetics of cTnI, a negative result within the  first hours of the onset of symptoms does not rule out myocardial infarction with certainty. If myocardial infarction is still suspected, repeat the test at appropriate intervals.   I-Stat Chem 8, ED  (not at [facility])     Status: Abnormal   Collection Time: 10/02/16 11:30 AM  Result Value Ref Range   Sodium 138 135 - 145 mmol/L   Potassium 4.1 3.5 - 5.1 mmol/L   Chloride 102 101 - 111 mmol/L   BUN 18 6 - 20 mg/dL   Creatinine, Ser 1.40 (H) 0.61 - 1.24 mg/dL   Glucose, Bld 121 (H) 65 - 99 mg/dL   Calcium, Ion 1.21 1.15 - 1.40 mmol/L   TCO2 24 0 - 100 mmol/L   Hemoglobin 10.9 (L) 13.0 - 17.0 g/dL  HCT 32.0 (L) 39.0 - 52.0 %    Studies/Results:  BRAIN MRI FINDINGS: Brain: Multifocal areas of acute acute infarction are redemonstrated throughout the LEFT hemisphere. Their relatively linear distribution, as well as involvement of the subcortical and periventricular white matter is most consistent with hypoperfusion, given the patient's high-grade stenosis on carotid Doppler. Most have less intense restriction as seen on series 3. No new areas of restricted diffusion are seen. No definite hemorrhage, mass lesion, hydrocephalus, or extra-axial fluid. Atrophy with chronic microvascular ischemic change redemonstrated.  Vascular: Normal flow voids.  Skull and upper cervical spine: Normal marrow signal.  Sinuses/Orbits: BILATERAL cataract extraction.  No sinus opacity.  Other: None.  IMPRESSION: Multifocal areas of subacute infarction are redemonstrated, without new areas of involvement, hemorrhage, or significant mass effect.  Atrophy and small vessel disease.     The brain MRI is reviewed from today and compared to the previous scan. There are a few increased signal seen involving the left frontoparietal region. There are seen in parasagittal distribution consistent with a watershed infarct. There are much less in intensity indicating subacute nature. No new areas  are seen. Actually there are much less lesion on seen on the current scan. FLAIR imaging shows chronic deep white matter and periventricular moderate leukoencephalopathy. No hemorrhages appreciated on SWI.    CAROTID DOPPLERS 08-2016 FINDINGS: RIGHT CAROTID ARTERY: Scattered calcified plaque through the common carotid artery and bulb extending into the proximal internal and external carotid arteries. No high-grade stenosis. Normal waveforms and color Doppler signal.  RIGHT VERTEBRAL ARTERY:  Normal flow direction and waveform.  LEFT CAROTID ARTERY: Smooth nonocclusive plaque through the common carotid artery. Heavily calcified plaque in the carotid bulb. There is significant eccentric plaque in both of the proximal internal and external carotid arteries with focal areas of stenosis. Markedly elevated peak systolic velocities in the proximal ICA with spectral broadening and focal aliasing on color Doppler ; distally a parvus tardus waveform. Mildly elevated peak systolic velocities in the external carotid artery.  LEFT VERTEBRAL ARTERY: Normal flow direction and waveform.  IMPRESSION: 1. Bilateral carotid bifurcation and proximal ICA plaque, resulting in NEAR OCCLUSIVE STENOSIS of the left ICA, less than 50% diameter stenosis of the right ICA. Recommend vascular surgical or neurointerventional radiology consultation. 2.  Antegrade bilateral vertebral arterial flow.     Minnah Llamas A. Merlene Colon, M.D.  Diplomate, Tax adviser of Psychiatry and Neurology ( Neurology). 10/02/2016, 6:19 PM

## 2016-10-03 ENCOUNTER — Observation Stay (HOSPITAL_COMMUNITY): Payer: Medicare Other

## 2016-10-03 ENCOUNTER — Encounter: Payer: Medicare Other | Admitting: Vascular Surgery

## 2016-10-03 DIAGNOSIS — I6320 Cerebral infarction due to unspecified occlusion or stenosis of unspecified precerebral arteries: Secondary | ICD-10-CM

## 2016-10-03 DIAGNOSIS — I4891 Unspecified atrial fibrillation: Secondary | ICD-10-CM | POA: Diagnosis not present

## 2016-10-03 DIAGNOSIS — I6523 Occlusion and stenosis of bilateral carotid arteries: Secondary | ICD-10-CM | POA: Diagnosis not present

## 2016-10-03 DIAGNOSIS — N183 Chronic kidney disease, stage 3 (moderate): Secondary | ICD-10-CM | POA: Diagnosis not present

## 2016-10-03 DIAGNOSIS — R200 Anesthesia of skin: Secondary | ICD-10-CM | POA: Diagnosis not present

## 2016-10-03 DIAGNOSIS — R202 Paresthesia of skin: Secondary | ICD-10-CM | POA: Diagnosis not present

## 2016-10-03 MED ORDER — PANTOPRAZOLE SODIUM 40 MG PO TBEC
40.0000 mg | DELAYED_RELEASE_TABLET | Freq: Every day | ORAL | Status: DC
  Administered 2016-10-03: 40 mg via ORAL
  Filled 2016-10-03: qty 1

## 2016-10-03 NOTE — Progress Notes (Signed)
PT Cancellation Note  Patient Details Name: Ronald Colon MRN: [id number] DOB: 09/06/1928   Cancelled Treatment:    Reason Eval/Treat Not Completed: PT screened, no needs identified, will sign off.    Pt received sitting up in the room, and was agreeable to PT, however pt was able to quickly ambulate 62ft independently with normal gait speed and no LOB.  Pt expressed that he is at his baseline level of function, and denied any residual numbness or tingling.     Beth Ariea Rochin, PT, DPT X: [phone]

## 2016-10-03 NOTE — Progress Notes (Signed)
OT Cancellation Note  Patient Details Name: Ronald Colon MRN: [id number] DOB: 1929-01-12   Cancelled Treatment:    Reason Eval/Treat Not Completed: OT screened, no needs identified, will sign off. Was briefly in room with patient who was seated on EOB. Patient reports that he is currently waiting to speak to the Doctor to be discharged. Patient states that all numbness is gone. No reports of weakness. Pt states he has no concerns at this time and wishes to go home. Thank you for the referral.   Ailene Ravel, OTR/L,CBIS  [phone]  10/03/2016, 8:57 AM

## 2016-10-03 NOTE — Discharge Summary (Signed)
Physician Discharge Summary  Ronald Colon NLZ:[id number] DOB: Jul 11, 1929 DOA: 10/02/2016  PCP: Purvis Kilts, MD  Admit date: 10/02/2016 Discharge date: 10/03/2016  Admitted From: home Disposition:  home  Recommendations for Outpatient Follow-up:  1. Follow up with PCP in 1-2 weeks 2. Please obtain BMP/CBC in one week 3. Follow up with vascular surgery in march as scheduled 4. Follow up with cardiology as scheduled 5. If dark stools recur, consider GI referral.  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 EDP discussed with neurology who recommended repeat MRI and observation overnight.  Patient is a good historian. He has done well since discharge from his recent surgery. He did have some bleeding at home and anticoagulation was temporarily discontinued, only being restarted 2/19. This morning when he awoke he had numbness radiating from the shoulder down to the hand. Strength intact and without change. Right shoulder painful. No specific aggravating or alleviating factors. Numbness lasted for several hours and spontaneously resolved. Currently feels much better and has no complaints. No difficulty speaking or swallowing. Reports shoulder pain is chronic. No difficulty using right arm.  The patient was monitored in the hospital overnight on telemetry. He did not have any recurrent/worsening of symptoms. Right arm numbness appears to have resolved and is back to baseline. The patient is feeling significantly improved and wants to discharge home. MRI of the brain did not show any acute infarct. He was seen by neurology who felt the  symptoms may be related to TIA versus cervical radiculopathy. Repeat carotid Dopplers were recommended, the patient does not wish to have this done in the hospital. This can be considered and follow-up with vascular surgery. He was restarted on anticoagulation on the day prior to admission. He has not had any recurrence of dark stools and his hemoglobin is stable from previous checks. Will continue him on Eliquis. If he does have any recurrence of dark stools, would consider referral to GI. He has seen Dr. Sharlett Iles in the past. Patient is anxious for discharge home. He appears stable at this time.  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   amiodarone 200 MG tablet Commonly known as:  PACERONE Take 1 tablet (200 mg total) by mouth daily.   apixaban 5 MG Tabs tablet Commonly known as:  ELIQUIS Take 1 tablet (5 mg total) by mouth 2 (two) times daily.   atorvastatin 40 MG tablet Commonly known as:  LIPITOR Take 1 tablet (40 mg total) by  mouth daily at 6 PM.   ezetimibe 10 MG tablet Commonly known as:  ZETIA Take 10 mg by mouth daily.   ferrous sulfate 325 (65 FE) MG tablet Take 325 mg by mouth 2 (two) times daily with a meal.   fish oil-omega-3 fatty acids 1000 MG capsule Take 1 g by mouth daily.   metoprolol tartrate 25 MG tablet Commonly known as:  LOPRESSOR Take 1 tablet (25 mg  total) by mouth 2 (two) times daily.   oxyCODONE-acetaminophen 5-325 MG tablet Commonly known as:  PERCOCET/ROXICET Take 1-2 tablets by mouth every 4 (four) hours as needed for moderate pain.   pantoprazole 20 MG tablet Commonly known as:  PROTONIX Take 1 tablet (20 mg total) by mouth daily.       Allergies  Allergen Reactions  . No Known Allergies     Consultations:  neurology   Procedures/Studies: Ct Head Wo Contrast  Result Date: 10/02/2016 CLINICAL DATA:  83 year old male with new right arm weakness and headache. CVA 3 weeks ago. Hypertension. Atrial fibrillation. Initial encounter. EXAM: CT HEAD WITHOUT CONTRAST TECHNIQUE: Contiguous axial images were obtained from the base of the skull through the vertex without intravenous contrast. COMPARISON:  09/11/2016 brain MR.  09/10/2016 head CT. FINDINGS: Brain: No intracranial hemorrhage. Subacute left frontal-parietal lobe infarcts without CT evidence to suggest extension. This can be addressed by MR if clinically desired. Remote small left caudate body infarct. Chronic microvascular changes. Global atrophy without hydrocephalus. No intracranial mass lesion noted on this unenhanced exam. Vascular: Vascular calcifications. Skull: Negative Sinuses/Orbits: Post lens replacement. Exophthalmos. No acute orbital abnormality. Minimal mucosal thickening right maxillary sinus. Other: Coarse calcification undersurface of the corpus callosum unchanged. IMPRESSION: No intracranial hemorrhage. Subacute left frontal-parietal lobe infarcts without CT evidence to suggest extension. This can be addressed by MR if clinically desired. Remote small left caudate body infarct. Electronically Signed   By: Genia Del M.D.   On: 10/02/2016 12:18   Ct Head Wo Contrast  Result Date: 09/10/2016 CLINICAL DATA:  RIGHT-sided facial droop, aphasia, RIGHT upper extremity weakness and numbness for 2-3 days, history essential benign hypertension, hyperlipidemia EXAM:  CT HEAD WITHOUT CONTRAST TECHNIQUE: Contiguous axial images were obtained from the base of the skull through the vertex without intravenous contrast. COMPARISON:  10/21/2003 FINDINGS: Brain: Generalized atrophy. Normal ventricular morphology. No midline shift or mass effect. Small vessel chronic ischemic changes of deep cerebral white matter. No intracranial hemorrhage, mass lesion, or evidence acute infarction. No extra-axial fluid collections. Vascular: Atherosclerotic calcifications of the carotid siphons and vertebral arteries. Skull: Intact Sinuses/Orbits: Clear Other: N/A IMPRESSION: Atrophy with small vessel chronic ischemic changes of deep cerebral white matter. No acute intracranial abnormalities. Electronically Signed   By: Lavonia Dana M.D.   On: 09/10/2016 17:54   Mr Brain Wo Contrast  Result Date: 10/02/2016 CLINICAL DATA:  Patient seen in ED for RIGHT arm numbness with headache. Recent stroke. EXAM: MRI HEAD WITHOUT CONTRAST TECHNIQUE: Multiplanar, multiecho pulse sequences of the brain and surrounding structures were obtained without intravenous contrast. COMPARISON:  MR head 09/11/2016. Carotid Doppler 09/11/2016. CT head earlier today. FINDINGS: Brain: Multifocal areas of acute acute infarction are redemonstrated throughout the LEFT hemisphere. Their relatively linear distribution, as well as involvement of the subcortical and periventricular white matter is most consistent with hypoperfusion, given the patient's high-grade stenosis on carotid Doppler. Most have less intense restriction as seen on series 3. No new areas of restricted diffusion are seen. No definite hemorrhage, mass lesion, hydrocephalus, or  extra-axial fluid. Atrophy with chronic microvascular ischemic change redemonstrated. Vascular: Normal flow voids. Skull and upper cervical spine: Normal marrow signal. Sinuses/Orbits: BILATERAL cataract extraction.  No sinus opacity. Other: None. IMPRESSION: Multifocal areas of subacute  infarction are redemonstrated, without new areas of involvement, hemorrhage, or significant mass effect. Atrophy and small vessel disease. Electronically Signed   By: Staci Righter M.D.   On: 10/02/2016 16:32   Mr Brain Wo Contrast  Result Date: 09/11/2016 CLINICAL DATA:  Right upper extremity numbness and weakness. Aphasia. EXAM: MRI HEAD WITHOUT CONTRAST MRA HEAD WITHOUT CONTRAST TECHNIQUE: Multiplanar, multiecho pulse sequences of the brain and surrounding structures were obtained without intravenous contrast. Angiographic images of the head were obtained using MRA technique without contrast. COMPARISON:  Head CT 09/10/2016 FINDINGS: MRI HEAD FINDINGS Brain: There are small foci of patchy acute cortical and white matter infarction throughout the left cerebral hemisphere. The parietal lobe is involved to the greatest extent, however there is also involvement of frontal lobe cortex at the vertex, centrum semiovale, posterior and lateral temporal lobe, and lateral occipital lobe. There is also a punctate acute infarct in the posterior aspect of the external capsule. There is no evidence of intracranial hemorrhage, mass, midline shift, or extra-axial fluid collection. Mild generalized cerebral atrophy is within normal limits for age. Patchy T2 hyperintensities in the cerebral white matter bilaterally are nonspecific but compatible with mild-to-moderate chronic small vessel ischemic disease. There is a chronic lacunar infarct in the body of the left caudate nucleus. Vascular: Major intracranial vascular flow voids are preserved. Skull and upper cervical spine: Unremarkable bone marrow signal. Sinuses/Orbits: Prior bilateral cataract extraction. Paranasal sinuses and mastoid air cells are clear. Other: None. MRA HEAD FINDINGS The visualized distal left vertebral artery is widely patent and strongly dominant. The distal right vertebral artery is hypoplastic with superimposed tandem severe stenoses. The left PICA,  right AICA, and bilateral SCA origins are patent. Basilar artery is widely patent. PCAs are patent with a moderate proximal right P2 stenosis noted. The internal carotid arteries are widely patent from skullbase to carotid termini. The right A1 segment is slightly dominant. There is a 4 x 3 mm superiorly directed outpouching from the proximal to mid left M1 segment with appearance most compatible with an aneurysm rather than occluded branch vessel. The left M1 segment is widely patent, as is what appears to be a normal left MCA bifurcation. There does not appear to be a decreased number of left MCA branch vessels compared to the contralateral side. IMPRESSION: 1. Patchy acute infarcts throughout the left cerebral hemisphere which may reflect emboli or hypoperfusion. 2. Mild-to-moderate chronic small vessel ischemic disease. 3. No intracranial large vessel occlusion. 4. 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 The dominant distal left vertebral artery is widely patent. 5. Moderate proximal right P2 stenosis. 6. 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 Electronically Signed   By: Logan Bores M.D.   On: 09/11/2016 08:32   US Carotid Bilateral  Result Date: 10/03/2016 CLINICAL DATA:  83 year old male with a history of right arm weakness. Cardiovascular risk factors include hypertension, stroke/TIA, hyperlipidemia, tobacco use, known vascular surgery with a prior left carotid endarterectomy 09/17/2016 EXAM: BILATERAL CAROTID DUPLEX ULTRASOUND TECHNIQUE: Pearline Cables scale imaging, color Doppler and duplex ultrasound were performed of bilateral carotid and vertebral arteries in the neck. COMPARISON:  MRI 10/02/2016, duplex 09/11/2016 FINDINGS: Criteria: Quantification of carotid stenosis is based on velocity parameters that correlate the residual internal carotid diameter with NASCET-based stenosis levels,  using the diameter of the distal internal carotid lumen as the denominator for stenosis measurement.  The following velocity measurements were obtained: RIGHT ICA:  Systolic 76 cm/sec, Diastolic 23 cm/sec CCA:  71 cm/sec SYSTOLIC ICA/CCA RATIO:  1.1 ECA:  97 cm/sec LEFT ICA:  Systolic 99 cm/sec, Diastolic 36 cm/sec CCA:  37 cm/sec SYSTOLIC ICA/CCA RATIO:  2.7 ECA:  46 cm/sec Right Brachial SBP: Not acquired Left Brachial SBP: Not acquired RIGHT CAROTID ARTERY: Heterogeneous plaque with scattered calcifications of the right common carotid artery. Intermediate waveform maintained. Heterogeneous and partially calcified plaque at the right carotid bifurcation. Low resistance waveform of the right ICA. RIGHT VERTEBRAL ARTERY: Right vertebral artery not visualized on the current study LEFT CAROTID ARTERY: Patent common carotid artery with low resistance waveform. Internal carotid artery remains patent with low resistance waveform. Lumen of the vessel at the surgical site not well visualized on power Doppler images, potentially secondary to the recent surgery. LEFT VERTEBRAL ARTERY:  Antegrade flow with low resistance waveform. IMPRESSION: Right: Color duplex indicates moderate heterogeneous and calcified plaque, with no hemodynamically significant stenosis by duplex criteria in the extracranial cerebrovascular circulation. Left: Left carotid system remains patent after recent reported carotid endarterectomy, with antegrade low resistance flow. Note that established carotid duplex criteria have not been validated in postoperative patients. Sensitivity of the duplex evaluation for the vessel lumen is reduced, potentially secondary to recent surgical changes, and if there is concern for evaluation of postoperative complication, cross-sectional imaging with CT angiogram, MR angiogram, or a formal cerebral angiogram may be considered. Signed, Dulcy Fanny. Earleen Newport, DO [facility] Radiology Electronically Signed   By: Corrie Mckusick D.O.   On: 10/03/2016 08:57   US Carotid Bilateral  (at [facility] Only)  Result Date: 09/11/2016 CLINICAL DATA:  TIA/stroke. Hypertension, hyperlipidemia, previous tobacco abuse. EXAM: BILATERAL CAROTID DUPLEX ULTRASOUND TECHNIQUE: Pearline Cables scale imaging, color Doppler and duplex ultrasound was performed of bilateral carotid and vertebral arteries in the neck. COMPARISON:  None available TECHNIQUE: Quantification of carotid stenosis is based on velocity parameters that correlate the residual internal carotid diameter with NASCET-based stenosis levels, using the diameter of the distal internal carotid lumen as the denominator for stenosis measurement. The following velocity measurements were obtained: PEAK SYSTOLIC/END DIASTOLIC RIGHT ICA:                     78/16cm/sec CCA:                     42/70WC/BJS SYSTOLIC ICA/CCA RATIO:  1.3 DIASTOLIC ICA/CCA RATIO: 1.5 ECA:                     141cm/sec LEFT ICA:                     661/311cm/sec CCA:                     28/31DV/VOH SYSTOLIC ICA/CCA RATIO:  60.7 DIASTOLIC ICA/CCA RATIO: 37.1 ECA:                     91cm/sec FINDINGS: RIGHT CAROTID ARTERY: Scattered calcified plaque through the common carotid artery and bulb extending into the proximal internal and external carotid arteries. No high-grade stenosis. Normal waveforms and color Doppler signal. RIGHT VERTEBRAL ARTERY:  Normal flow direction and waveform. LEFT CAROTID ARTERY: Smooth nonocclusive plaque through the common carotid artery. Heavily calcified plaque in the  carotid bulb. There is significant eccentric plaque in both of the proximal internal and external carotid arteries with focal areas of stenosis. Markedly elevated peak systolic velocities in the proximal ICA with spectral broadening and focal aliasing on color Doppler ; distally a parvus tardus waveform. Mildly elevated peak systolic velocities in the external carotid artery. LEFT VERTEBRAL ARTERY: Normal flow direction and waveform. IMPRESSION: 1. Bilateral carotid bifurcation and proximal ICA  plaque, resulting in NEAR OCCLUSIVE STENOSIS of the left ICA, less than 50% diameter stenosis of the right ICA. Recommend vascular surgical or neurointerventional radiology consultation. 2.  Antegrade bilateral vertebral arterial flow. Electronically Signed   By: Lucrezia Europe M.D.   On: 09/11/2016 09:06   Mr Jodene Nam Head/brain SN Cm  Result Date: 09/11/2016 CLINICAL DATA:  Right upper extremity numbness and weakness. Aphasia. EXAM: MRI HEAD WITHOUT CONTRAST MRA HEAD WITHOUT CONTRAST TECHNIQUE: Multiplanar, multiecho pulse sequences of the brain and surrounding structures were obtained without intravenous contrast. Angiographic images of the head were obtained using MRA technique without contrast. COMPARISON:  Head CT 09/10/2016 FINDINGS: MRI HEAD FINDINGS Brain: There are small foci of patchy acute cortical and white matter infarction throughout the left cerebral hemisphere. The parietal lobe is involved to the greatest extent, however there is also involvement of frontal lobe cortex at the vertex, centrum semiovale, posterior and lateral temporal lobe, and lateral occipital lobe. There is also a punctate acute infarct in the posterior aspect of the external capsule. There is no evidence of intracranial hemorrhage, mass, midline shift, or extra-axial fluid collection. Mild generalized cerebral atrophy is within normal limits for age. Patchy T2 hyperintensities in the cerebral white matter bilaterally are nonspecific but compatible with mild-to-moderate chronic small vessel ischemic disease. There is a chronic lacunar infarct in the body of the left caudate nucleus. Vascular: Major intracranial vascular flow voids are preserved. Skull and upper cervical spine: Unremarkable bone marrow signal. Sinuses/Orbits: Prior bilateral cataract extraction. Paranasal sinuses and mastoid air cells are clear. Other: None. MRA HEAD FINDINGS The visualized distal left vertebral artery is widely patent and strongly dominant. The distal  right vertebral artery is hypoplastic with superimposed tandem severe stenoses. The left PICA, right AICA, and bilateral SCA origins are patent. Basilar artery is widely patent. PCAs are patent with a moderate proximal right P2 stenosis noted. The internal carotid arteries are widely patent from skullbase to carotid termini. The right A1 segment is slightly dominant. There is a 4 x 3 mm superiorly directed outpouching from the proximal to mid left M1 segment with appearance most compatible with an aneurysm rather than occluded branch vessel. The left M1 segment is widely patent, as is what appears to be a normal left MCA bifurcation. There does not appear to be a decreased number of left MCA branch vessels compared to the contralateral side. IMPRESSION: 1. Patchy acute infarcts throughout the left cerebral hemisphere which may reflect emboli or hypoperfusion. 2. Mild-to-moderate chronic small vessel ischemic disease. 3. No intracranial large vessel occlusion. 4. 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 The dominant distal left vertebral artery is widely patent. 5. Moderate proximal right P2 stenosis. 6. 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 Electronically Signed   By: Logan Bores M.D.   On: 09/11/2016 08:32       Subjective: Right hand numbness improving, at baseline. No new complaints  Discharge Exam: Vitals:   10/03/16 0329 10/03/16 0529  BP: 140/80 133/78  Pulse: 86 84  Resp: 16 16  Temp: 98.3  F (36.8 C) 98.2 F (36.8 C)   Vitals:   10/02/16 2329 10/03/16 0129 10/03/16 0329 10/03/16 0529  BP: 140/87 138/82 140/80 133/78  Pulse: 86 84 86 84  Resp: 16 16 16 16   Temp: 98.2 F (36.8 C) 98 F (36.7 C) 98.3 F (36.8 C) 98.2 F (36.8 C)  TempSrc: Oral Oral Oral Oral  SpO2: 100% 100% 100% 100%  Weight:      Height:        General: Pt is alert, awake, not in acute distress Cardiovascular: irregular, S1/S2 +, no rubs, no gallops Respiratory: CTA bilaterally, no  wheezing, no rhonchi Abdominal: Soft, NT, ND, bowel sounds + Extremities: no edema, no cyanosis    The results of significant diagnostics from this hospitalization (including imaging, microbiology, ancillary and laboratory) are listed below for reference.     Microbiology: No results found for this or any previous visit (from the past 240 hour(s)).   Labs: BNP (last 3 results) No results for input(s): BNP in the last 8760 hours. Basic Metabolic Panel:  Recent Labs Lab 10/02/16 1115 10/02/16 1130  NA 135 138  K 4.1 4.1  CL 102 102  CO2 25  --   GLUCOSE 121* 121*  BUN 19 18  CREATININE 1.35* 1.40*  CALCIUM 9.3  --    Liver Function Tests:  Recent Labs Lab 10/02/16 1115  AST 21  ALT 22  ALKPHOS 50  BILITOT 0.7  PROT 6.6  ALBUMIN 3.6   No results for input(s): LIPASE, AMYLASE in the last 168 hours. No results for input(s): AMMONIA in the last 168 hours. CBC:  Recent Labs Lab 10/02/16 1115 10/02/16 1130  WBC 7.5  --   NEUTROABS 4.9  --   HGB 11.2* 10.9*  HCT 34.0* 32.0*  MCV 96.0  --   PLT 300  --    Cardiac Enzymes: No results for input(s): CKTOTAL, CKMB, CKMBINDEX, TROPONINI in the last 168 hours. BNP: Invalid input(s): POCBNP CBG: No results for input(s): GLUCAP in the last 168 hours. D-Dimer No results for input(s): DDIMER in the last 72 hours. Hgb A1c No results for input(s): HGBA1C in the last 72 hours. Lipid Profile No results for input(s): CHOL, HDL, LDLCALC, TRIG, CHOLHDL, LDLDIRECT in the last 72 hours. Thyroid function studies No results for input(s): TSH, T4TOTAL, T3FREE, THYROIDAB in the last 72 hours.  Invalid input(s): FREET3 Anemia work up No results for input(s): VITAMINB12, FOLATE, FERRITIN, TIBC, IRON, RETICCTPCT in the last 72 hours. Urinalysis    Component Value Date/Time   COLORURINE YELLOW 10/02/2016 1048   APPEARANCEUR CLEAR 10/02/2016 1048   LABSPEC 1.012 10/02/2016 1048   PHURINE 5.0 10/02/2016 1048   GLUCOSEU  NEGATIVE 10/02/2016 1048   HGBUR NEGATIVE 10/02/2016 1048   BILIRUBINUR NEGATIVE 10/02/2016 1048   KETONESUR NEGATIVE 10/02/2016 1048   PROTEINUR NEGATIVE 10/02/2016 1048   NITRITE NEGATIVE 10/02/2016 1048   LEUKOCYTESUR NEGATIVE 10/02/2016 1048   Sepsis Labs Invalid input(s): PROCALCITONIN,  WBC,  LACTICIDVEN Microbiology No results found for this or any previous visit (from the past 240 hour(s)).   Time coordinating discharge: Over 30 minutes  SIGNED:   Kathie Dike, MD  [facility] 10/03/2016, 9:11 AM Pager   If 7PM-7AM, please contact night-coverage [email or web address] Password [email or web address]

## 2016-10-03 NOTE — Progress Notes (Signed)
Patient with orders to be discharge home. Discharge instructions given patient and son verbalized understanding. Patient stable. Patient left in private vehicle with son.

## 2016-10-03 NOTE — Care Management Obs Status (Signed)
[place] NOTIFICATION   Patient Details  Name: Ronald Colon MRN: [id number] Date of Birth: Jan 09, 1929   Medicare Observation Status Notification Given:  Other (see comment) (Pt discharged <24hrs)    Sherald Barge, RN 10/03/2016, 9:27 AM

## 2016-10-11 ENCOUNTER — Telehealth: Payer: Self-pay | Admitting: Cardiology

## 2016-10-11 ENCOUNTER — Other Ambulatory Visit (HOSPITAL_COMMUNITY)
Admission: RE | Admit: 2016-10-11 | Discharge: 2016-10-11 | Disposition: A | Discharge status: Home / Self Care | Payer: Medicare Other | Source: Ambulatory Visit | Attending: Cardiology | Admitting: Cardiology

## 2016-10-11 DIAGNOSIS — K921 Melena: Secondary | ICD-10-CM | POA: Insufficient documentation

## 2016-10-11 LAB — CBC
HCT: 35.2 % — ABNORMAL LOW (ref 39.0–52.0)
HEMOGLOBIN: 11.7 g/dL — AB (ref 13.0–17.0)
MCH: 32.1 pg (ref 26.0–34.0)
MCHC: 33.2 g/dL (ref 30.0–36.0)
MCV: 96.4 fL (ref 78.0–100.0)
PLATELETS: 231 10*3/uL (ref 150–400)
RBC: 3.65 MIL/uL — AB (ref 4.22–5.81)
RDW: 14.7 % (ref 11.5–15.5)
WBC: 9.1 10*3/uL (ref 4.0–10.5)

## 2016-10-11 NOTE — Telephone Encounter (Signed)
Please see my recent office note which explains the complexity of the situation fairly clearly. I presume that he is back on Eliquis at this time. Please make him a GI consultation referral ASAP. Check CBC. I put him on a PPI at the last visit. With a recent stroke and atrial fibrillation I would prefer not to hold his Eliquis unless absolutely necessary.

## 2016-10-11 NOTE — Telephone Encounter (Signed)
Pt using "early detect" pads which he dropped in toilet this am after bowel movement and it turned blue-positive for blood according to son. Patient had negative result yesterday. Patient also said he felt like he needed to have another stool and "something didn't feel right" in his stomach, like when he had an ulcer years ago      Will forward to Dr Domenic Polite for instructions

## 2016-10-11 NOTE — Telephone Encounter (Addendum)
Dr Olevia Perches office called,they will schedule patient with NP Coralyn Mark for Monday 10/15/16, they are calling the son now,   HGB today 11.7   (10/02/16  was 10.9 )  HCT today  35.2   (10/02/16  was 32.0 )    Pt understands to remain on Eliquis until told otherwise

## 2016-10-11 NOTE — Addendum Note (Signed)
Addended by: Barbarann Ehlers A on: 10/11/2016 01:03 PM   Modules accepted: Orders

## 2016-10-11 NOTE — Telephone Encounter (Signed)
Per pt phone call--he's been passing blood this morning. Pt is on Eliquis, he'd like to know what to do

## 2016-10-11 NOTE — Telephone Encounter (Signed)
Son Christia Reading [phone], will bring father up to [facility] lab to have STAT CBC done,I have placed STAT referral to Dr Laural Golden

## 2016-10-12 ENCOUNTER — Encounter: Payer: Medicare Other | Admitting: Vascular Surgery

## 2016-10-12 ENCOUNTER — Encounter: Payer: Self-pay | Admitting: Vascular Surgery

## 2016-10-12 NOTE — Telephone Encounter (Signed)
Thank you for follow-up. Somewhat rreassuring that hemoglobin is higher.

## 2016-10-15 ENCOUNTER — Ambulatory Visit (INDEPENDENT_AMBULATORY_CARE_PROVIDER_SITE_OTHER): Payer: Medicare Other | Admitting: Internal Medicine

## 2016-10-15 ENCOUNTER — Encounter (INDEPENDENT_AMBULATORY_CARE_PROVIDER_SITE_OTHER): Payer: Self-pay | Admitting: Internal Medicine

## 2016-10-15 VITALS — BP 130/86 | HR 64 | Temp 97.8°F | Ht 74.0 in | Wt 201.0 lb

## 2016-10-15 DIAGNOSIS — R195 Other fecal abnormalities: Secondary | ICD-10-CM | POA: Diagnosis not present

## 2016-10-15 DIAGNOSIS — I6522 Occlusion and stenosis of left carotid artery: Secondary | ICD-10-CM

## 2016-10-15 LAB — CBC
HCT: 34.5 % — ABNORMAL LOW (ref 38.5–50.0)
Hemoglobin: 11.3 g/dL — ABNORMAL LOW (ref 13.2–17.1)
MCH: 31.3 pg (ref 27.0–33.0)
MCHC: 32.8 g/dL (ref 32.0–36.0)
MCV: 95.6 fL (ref 80.0–100.0)
MPV: 11.5 fL (ref 7.5–12.5)
PLATELETS: 256 10*3/uL (ref 140–400)
RBC: 3.61 MIL/uL — AB (ref 4.20–5.80)
RDW: 14.9 % (ref 11.0–15.0)
WBC: 8.1 10*3/uL (ref 3.8–10.8)

## 2016-10-15 NOTE — Progress Notes (Addendum)
Subjective:    Patient ID: Ronald Colon, male    DOB: 08-19-1928, 83 y.o.   MRN: [id number]  HPI Presents today  stating hee had a positive "early detect" this month. He denies seeing any blood. His last colonoscopy was in 2013. His stools are black from taking the iron. Before the iron, his stools were brown.  His appetite is good. No weight loss.  Denies any abdominal pain.   Started Eliquis March of 2018. CVA 09/10/2016 and underwent   Left carotid endarterectomy with bovine patch angioplasty  EGD in January of 2015 with hx of bleeding PUD.  Impression: No evidence of esophagitis, ring or stricture format Antral gastritis with deformity to pylorus but it is patent. Biopsy taken from antral mucosa. Esophagus dilated by passing 56 [place] dilator.    His last colonoscopy was in June of 2013 which revealed diverticulosis, mild, left sided. Sessile polyp in distal transverse colon. Diminutive polyp in the rectum.  Biopsy:Transverse colon: Tubular adenoma. Negative for high grade dysplasia.  Hx of atrial fib and  CVA, CKD stage 3  CBC Latest Ref Rng & Units 10/11/2016 10/02/2016 10/02/2016  WBC 4.0 - 10.5 K/uL 9.1 - 7.5  Hemoglobin 13.0 - 17.0 g/dL 11.7(L) 10.9(L) 11.2(L)  Hematocrit 39.0 - 52.0 % 35.2(L) 32.0(L) 34.0(L)  Platelets 150 - 400 K/uL 231 - 300       Review of Systems Past Medical History:  Diagnosis Date  . Aortic stenosis    Mild February 2018  . Arthritis   . Atrial fibrillation [facility])    Diagnosed January 2018  . Carotid stenosis   . Colon polyp   . Diverticulosis   . Essential hypertension, benign   . Gout   . Hyperlipidemia   . Rheumatic fever 1937  . Stroke [facility]) 09/10/2016    Past Surgical History:  Procedure Laterality Date  . CATARACT EXTRACTION W/PHACO Right 08/02/2014   Procedure: CATARACT EXTRACTION PHACO AND INTRAOCULAR LENS PLACEMENT; CDE:  12.77;  Surgeon: Williams Che, MD;  [facility]: [facility];  Service: Ophthalmology;   Laterality: Right;  . CATARACT EXTRACTION W/PHACO Left 02/15/2015   Procedure: CATARACT EXTRACTION PHACO AND INTRAOCULAR LENS PLACEMENT (IOC);  Surgeon: Williams Che, MD;  Location: [facility];  Service: Ophthalmology;  Laterality: Left;  CDE 18.00  . COLONOSCOPY    . ELBOW SURGERY Right    "grissle in there"  . ENDARTERECTOMY Left 09/17/2016   Procedure: LEFT CAROTID ENDARTECTOMY;  Surgeon: Waynetta Sandy, MD;  Location: [place];  Service: Vascular;  Laterality: Left;  . ESOPHAGEAL DILATION  09/03/2013   Procedure: ESOPHAGEAL DILATION;  Surgeon: Rogene Houston, MD;  [facility];  Service: Endoscopy;;  . ESOPHAGOGASTRODUODENOSCOPY N/A 09/03/2013   Procedure: ESOPHAGOGASTRODUODENOSCOPY (EGD);  Surgeon: Rogene Houston, MD;  [facility];  Service: Endoscopy;  Laterality: N/A;  255-rescheduled to 1030 Ann to notify pt  . PATCH ANGIOPLASTY Left 09/17/2016   Procedure: PATCH ANGIOPLASTY WITH Rueben Bash BIOLOGIC PATCH 1 CM X6 CM;  Surgeon: Waynetta Sandy, MD;  Location: [place];  Service: Vascular;  Laterality: Left;    Allergies  Allergen Reactions  . No Known Allergies     Current Outpatient Prescriptions on File Prior to Visit  Medication Sig Dispense Refill  . allopurinol (ZYLOPRIM) 300 MG tablet Take 300 mg by mouth daily.    Marland Kitchen amiodarone (PACERONE) 200 MG tablet Take 1 tablet (200 mg total) by mouth daily. 30 tablet 3  . apixaban (  ELIQUIS) 5 MG TABS tablet Take 1 tablet (5 mg total) by mouth 2 (two) times daily. 60 tablet 0  . atorvastatin (LIPITOR) 40 MG tablet Take 1 tablet (40 mg total) by mouth daily at 6 PM. 30 tablet 11  . ezetimibe (ZETIA) 10 MG tablet Take 10 mg by mouth daily.    . ferrous sulfate 325 (65 FE) MG tablet Take 325 mg by mouth 2 (two) times daily with a meal.    . fish oil-omega-3 fatty acids 1000 MG capsule Take 1 g by mouth daily.    . metoprolol tartrate (LOPRESSOR) 25 MG tablet Take 1 tablet (25 mg total) by mouth 2 (two) times  daily. 60 tablet 3  . pantoprazole (PROTONIX) 20 MG tablet Take 1 tablet (20 mg total) by mouth daily. 90 tablet 3   No current facility-administered medications on file prior to visit.        Objective:   Physical Exam Blood pressure 130/86, pulse 64, temperature 97.8 F (36.6 C), height 6\' 2"  (1.88 m), weight 201 lb (91.2 kg). Alert and oriented. Skin warm and dry. Oral mucosa is moist.   . Sclera anicteric, conjunctivae is pink. Thyroid not enlarged. No cervical lymphadenopathy. Lungs clear. Heart regular rate and rhythm.  Abdomen is soft. Bowel sounds are positive. No hepatomegaly. No abdominal masses felt. No tenderness.  No edema to lower extremities.  Stool brown and guaiac negative.        Assessment & Plan:  Guaiac positive stool. 3 stool cards home with patient. CBC today. Further recommendations to follow.

## 2016-10-15 NOTE — Patient Instructions (Signed)
CBC. 3 stool cards home with patient.

## 2016-10-16 ENCOUNTER — Telehealth: Payer: Self-pay | Admitting: Cardiology

## 2016-10-16 DIAGNOSIS — I639 Cerebral infarction, unspecified: Secondary | ICD-10-CM | POA: Diagnosis not present

## 2016-10-16 DIAGNOSIS — I6522 Occlusion and stenosis of left carotid artery: Secondary | ICD-10-CM | POA: Diagnosis not present

## 2016-10-16 DIAGNOSIS — Z1389 Encounter for screening for other disorder: Secondary | ICD-10-CM | POA: Diagnosis not present

## 2016-10-16 DIAGNOSIS — Z6825 Body mass index (BMI) 25.0-25.9, adult: Secondary | ICD-10-CM | POA: Diagnosis not present

## 2016-10-16 NOTE — Telephone Encounter (Signed)
Patient is asking if he can use Miralax while taking Eliquis. / tg

## 2016-10-16 NOTE — Telephone Encounter (Signed)
Will forward to MD.  

## 2016-10-17 NOTE — Telephone Encounter (Signed)
Patient notified and voiced understanding.

## 2016-10-17 NOTE — Telephone Encounter (Signed)
Should be okay to take Miralax.

## 2016-10-19 ENCOUNTER — Encounter: Payer: Self-pay | Admitting: Vascular Surgery

## 2016-10-19 ENCOUNTER — Ambulatory Visit (INDEPENDENT_AMBULATORY_CARE_PROVIDER_SITE_OTHER): Payer: Self-pay | Admitting: Vascular Surgery

## 2016-10-19 VITALS — BP 137/65 | HR 92 | Temp 97.9°F | Resp 20 | Ht 74.0 in | Wt 199.0 lb

## 2016-10-19 DIAGNOSIS — I6522 Occlusion and stenosis of left carotid artery: Secondary | ICD-10-CM

## 2016-10-19 NOTE — Progress Notes (Signed)
Subjective:     Patient ID: Ronald Colon, male   DOB: [birth year], 83 y.o.   MRN: [id number]  HPI interstitial male follows up from recent left carotid endarterectomy for symptomatic disease. He was unfortunately readmitted with nonspecific symptoms following his operation. His MRI at that time was unchanged and he had a negative duplex. He is now asymptomatic having a stroke or amaurosis or TIA symptoms since the operation. He wants to get back to driving. He is pain-free.   Review of Systems No complaints    Objective:   Physical Exam aaox3 Well healed left neck incision Neuro in tact    Assessment/plan     Ronald Colon returns for follow-up of his recent left carotid endarterectomy. He did have a duplex at that time which was negative with the open endarterectomy his MRI was also negative for any new disease when he was readmitted. He will follow-up in 6 months with repeat carotid duplex. Continue Eliquis was and aspirin.    Ronald Colon Matters, MD [facility] Office: [phone] Pager: [phone]

## 2016-10-23 ENCOUNTER — Telehealth (INDEPENDENT_AMBULATORY_CARE_PROVIDER_SITE_OTHER): Payer: Self-pay | Admitting: *Deleted

## 2016-10-23 DIAGNOSIS — R195 Other fecal abnormalities: Secondary | ICD-10-CM | POA: Diagnosis not present

## 2016-10-23 NOTE — Telephone Encounter (Signed)
   Diagnosis:    Result(s)   Card 1: Positive:      Card 2: : Negative:   Card 3: Negative:    Completed by: Thomas Hoff ,LPN    HEMOCCULT SENSA DEVELOPER: DSW#:[id number]   EXPIRATION DATE: 2020/05   HEMOCCULT SENSA CARD:  CHJ#:[id number]   EXPIRATION DATE: 05/20   CARD CONTROL RESULTS:  POSITIVE: Positive  NEGATIVE: Negative    ADDITIONAL COMMENTS: Forwarded to Lelon Perla ordering provider. Patient has not been called with results.

## 2016-10-23 NOTE — Telephone Encounter (Signed)
Noted. He needs a colonoscopy. I have discussed with Dr. Laural Golden. He says he is going to talk with his cardiologist first.

## 2016-10-24 NOTE — Progress Notes (Signed)
Cardiology Office Note  Date: 10/25/2016   ID: TARRELL DEBES, DOB 1928/12/28, MRN [id number]  PCP: Purvis Kilts, MD  Primary Cardiologist: [name], MD   Chief Complaint  Patient presents with  . Atrial Fibrillation    History of Present Illness: Ronald Colon is an 83 y.o. male that I saw in February, extensive records reviewed at that time and detailed in the prior note. He returns for a follow-up visit today. He was kept off Eliquis temporarily with reported hematochezia, reported recurrent symptoms when he went back on Eliquis although follow-up CBC actually showed improvement in hemoglobin 10.9 up to 11.7. He had been placed on PPI and iron supplements, and a referral was made to GI. Colonoscopy has been recommended. He is here with his son today for follow-up. He is hesitant to undergo colonoscopy at this time, and is actually considering trying to reestablish with his prior GI practice in [facility]).  He states that he has not had any more obvious bleeding in his stools in the last few weeks. He remains in atrial fibrillation. CHADSVASC score is 5. He has a history of recent stroke with severe left carotid artery stenosis status post CEA by Dr. Donzetta Matters.  Today I went over his medications and we discussed continuing Eliquis in light of his high risk of stroke. I do think that he needs to continue close GI follow-up and consider colonoscopy +/- EGD. This could potentially be hemorrhoidal bleeding, but he does state that he had an ulcer in the past.  Past Medical History:  Diagnosis Date  . Aortic stenosis    Mild February 2018  . Arthritis   . Atrial fibrillation [facility])    Diagnosed January 2018  . Carotid stenosis   . Colon polyp   . Diverticulosis   . Essential hypertension, benign   . Gout   . Hyperlipidemia   . Rheumatic fever 1937  . Stroke [facility]) 09/10/2016    Past Surgical History:  Procedure Laterality Date  . CATARACT EXTRACTION W/PHACO  Right 08/02/2014   Procedure: CATARACT EXTRACTION PHACO AND INTRAOCULAR LENS PLACEMENT; CDE:  12.77;  Surgeon: Williams Che, MD;  [facility];  Service: Ophthalmology;  Laterality: Right;  . CATARACT EXTRACTION W/PHACO Left 02/15/2015   Procedure: CATARACT EXTRACTION PHACO AND INTRAOCULAR LENS PLACEMENT (IOC);  Surgeon: Williams Che, MD;  [facility];  Service: Ophthalmology;  Laterality: Left;  CDE 18.00  . COLONOSCOPY    . ELBOW SURGERY Right    "grissle in there"  . ENDARTERECTOMY Left 09/17/2016   Procedure: LEFT CAROTID ENDARTECTOMY;  Surgeon: Waynetta Sandy, MD;  Location: [place];  Service: Vascular;  Laterality: Left;  . ESOPHAGEAL DILATION  09/03/2013   Procedure: ESOPHAGEAL DILATION;  Surgeon: Rogene Houston, MD;  [facility];  Service: Endoscopy;;  . ESOPHAGOGASTRODUODENOSCOPY N/A 09/03/2013   Procedure: ESOPHAGOGASTRODUODENOSCOPY (EGD);  Surgeon: Rogene Houston, MD;  [facility];  Service: Endoscopy;  Laterality: N/A;  255-rescheduled to 1030 Ann to notify pt  . PATCH ANGIOPLASTY Left 09/17/2016   Procedure: PATCH ANGIOPLASTY WITH Rueben Bash BIOLOGIC PATCH 1 CM X6 CM;  Surgeon: Waynetta Sandy, MD;  Location: [facility] OR;  Service: Vascular;  Laterality: Left;    Current Outpatient Prescriptions  Medication Sig Dispense Refill  . allopurinol (ZYLOPRIM) 300 MG tablet Take 300 mg by mouth daily.    Marland Kitchen amiodarone (PACERONE) 200 MG tablet Take 1 tablet (200 mg total) by mouth daily.  30 tablet 3  . apixaban (ELIQUIS) 5 MG TABS tablet Take 1 tablet (5 mg total) by mouth 2 (two) times daily. 60 tablet 0  . atorvastatin (LIPITOR) 40 MG tablet Take 1 tablet (40 mg total) by mouth daily at 6 PM. 30 tablet 11  . ezetimibe (ZETIA) 10 MG tablet Take 10 mg by mouth daily.    . ferrous sulfate 325 (65 FE) MG tablet Take 325 mg by mouth 2 (two) times daily with a meal.    . fish oil-omega-3 fatty acids 1000 MG capsule Take 1 g by mouth daily.    .  metoprolol tartrate (LOPRESSOR) 25 MG tablet Take 1 tablet (25 mg total) by mouth 2 (two) times daily. 60 tablet 3  . pantoprazole (PROTONIX) 20 MG tablet Take 1 tablet (20 mg total) by mouth daily. 90 tablet 3   No current facility-administered medications for this visit.    Allergies:  No known allergies   Social History: The patient  reports that he quit smoking about 38 years ago. His smoking use included Cigarettes. He has a 20.00 pack-year smoking history. He has never used smokeless tobacco. He reports that he does not drink alcohol or use drugs.   ROS:  Please see the history of present illness. Otherwise, complete review of systems is positive for hearing loss, intermittent gout flares.  All other systems are reviewed and negative.   Physical Exam: VS:  BP 124/64   Pulse 76   Ht 6\' 2"  (1.88 m)   Wt 203 lb (92.1 kg)   SpO2 99%   BMI 26.06 kg/m , BMI Body mass index is 26.06 kg/m.  Wt Readings from Last 3 Encounters:  10/25/16 203 lb (92.1 kg)  10/19/16 199 lb (90.3 kg)  10/15/16 201 lb (91.2 kg)    General: Patient appears comfortable at rest. HEENT: Conjunctiva and lids normal, oropharynx clear with moist mucosa. Neck: Supple, no elevated JVP, left CEA scar healing, no thyromegaly. Lungs: Clear to auscultation, nonlabored breathing at rest. Cardiac: Irregularly irregular, no S3, 2/6 systolic murmur, no pericardial rub. Abdomen: Soft, nontender, bowel sounds present, no guarding or rebound. Extremities: Trace ankle edema, distal pulses 2+. Skin: Warm and dry. Musculoskeletal: No kyphosis. Neuropsychiatric: Alert and oriented x3, affect grossly appropriate. Mild residual right upper extremity weakness compared to the left.  ECG: I personally reviewed the tracing from 10/02/2016 which showed atrial fibrillation with increased voltage, left anterior fascicular block with IVCD.  Recent Labwork: 09/10/2016: TSH 2.827 10/02/2016: ALT 22; AST 21; BUN 18; Creatinine, Ser 1.40;  Potassium 4.1; Sodium 138 10/15/2016: Hemoglobin 11.3; Platelets 256     Component Value Date/Time   CHOL 178 09/11/2016 0252   TRIG 139 09/11/2016 0252   HDL 37 (L) 09/11/2016 0252   CHOLHDL 4.8 09/11/2016 0252   VLDL 28 09/11/2016 0252   LDLCALC 113 (H) 09/11/2016 0252    Other Studies Reviewed Today:  Echocardiogram 09/13/2016: Study Conclusions  - Left ventricle: The cavity size was normal. Wall thickness was increased in a pattern of mild LVH. There was mild focal basal hypertrophy of the septum. Systolic function was normal. The estimated ejection fraction was in the range of 50% to 55%. Wall motion was normal; there were no regional wall motion abnormalities. - Aortic valve: Valve mobility was restricted. There was mild stenosis. There was trivial regurgitation. - Mitral valve: Calcified annulus.  Impressions:  - Normal LV systolic function; calcified aortic valve with mild AS (mean gradient 11 mmHg); trace AI.  Carotid Dopplers 09/11/2016: IMPRESSION: 1. Bilateral carotid bifurcation and proximal ICA plaque, resulting in NEAR OCCLUSIVE STENOSIS of the left ICA, less than 50% diameter stenosis of the right ICA. Recommend vascular surgical or neurointerventional radiology consultation. 2. Antegrade bilateral vertebral arterial flow.  Assessment and Plan:  1. Persistent atrial fibrillation with CHADSVASC score of 5. He remains on amiodarone, metoprolol, and Eliquis. Do not plan to cardiovert at this point in light of intermittent hematochezia and expected further GI workup which would necessitate temporary discontinuation of anticoagulation. Fortunately, he does not report any hematochezia in the last few weeks, his hemoglobin is going up. He seems hesitant to undergo colonoscopy here at [facility], told me that he was considering reestablishing with [facility] in [place] (previously saw Dr. Sharlett Iles). I recommended that he not delay on this.  2.  History of stroke with severe left carotid artery stenosis status post CEA by Dr. Donzetta Matters.  3. Hyperlipidemia, remains on Lipitor.  4. Mild aortic stenosis by echocardiography, no change on examination.  Current medicines were reviewed with the patient today.   Orders Placed This Encounter  Procedures  . CBC    Disposition: Follow-up within the next 2 months.  Signed, Satira Sark, MD, [facility] 10/25/2016 9:27 AM    [facility]. [address], [place], [place] [address] Phone: [phone]; Fax: [phone]

## 2016-10-25 ENCOUNTER — Telehealth: Payer: Self-pay | Admitting: Cardiology

## 2016-10-25 ENCOUNTER — Encounter: Payer: Self-pay | Admitting: Cardiology

## 2016-10-25 ENCOUNTER — Ambulatory Visit (INDEPENDENT_AMBULATORY_CARE_PROVIDER_SITE_OTHER): Payer: Medicare Other | Admitting: Cardiology

## 2016-10-25 VITALS — BP 124/64 | HR 76 | Ht 74.0 in | Wt 203.0 lb

## 2016-10-25 DIAGNOSIS — I350 Nonrheumatic aortic (valve) stenosis: Secondary | ICD-10-CM | POA: Diagnosis not present

## 2016-10-25 DIAGNOSIS — Z8673 Personal history of transient ischemic attack (TIA), and cerebral infarction without residual deficits: Secondary | ICD-10-CM

## 2016-10-25 DIAGNOSIS — E782 Mixed hyperlipidemia: Secondary | ICD-10-CM | POA: Diagnosis not present

## 2016-10-25 DIAGNOSIS — Z8719 Personal history of other diseases of the digestive system: Secondary | ICD-10-CM

## 2016-10-25 DIAGNOSIS — I4819 Other persistent atrial fibrillation: Secondary | ICD-10-CM

## 2016-10-25 DIAGNOSIS — I6522 Occlusion and stenosis of left carotid artery: Secondary | ICD-10-CM

## 2016-10-25 DIAGNOSIS — I481 Persistent atrial fibrillation: Secondary | ICD-10-CM | POA: Diagnosis not present

## 2016-10-25 NOTE — Telephone Encounter (Signed)
Pt wanted to let Dr. Domenic Polite know he is taking Colchicine for his Gout

## 2016-10-25 NOTE — Addendum Note (Signed)
Addended by: Lianne Cure A on: 10/25/2016 09:54 AM   Modules accepted: Orders

## 2016-10-25 NOTE — Telephone Encounter (Signed)
Sent to Dr. McDowell as an FYI: 

## 2016-10-25 NOTE — Patient Instructions (Signed)
Your physician recommends that you schedule a follow-up appointment in: 2 months with Dr Elenor Quinones GET LAB WORK JUST BEFORE THAT VISIT :   CBC    Your physician recommends that you continue on your current medications as directed. Please refer to the Current Medication list given to you today.        Thank you for choosing [facility] !

## 2016-10-29 ENCOUNTER — Telehealth: Payer: Self-pay

## 2016-10-29 ENCOUNTER — Telehealth: Payer: Self-pay | Admitting: Internal Medicine

## 2016-10-29 NOTE — Telephone Encounter (Signed)
FYI: Patient called to say he wants to se GI in [place] where his son went.He cancelled apts with [facility].He states he cannot be seen until May and wants to know if that is ok with you.He will ask to be placed on waiting list.

## 2016-10-29 NOTE — Telephone Encounter (Signed)
Former Dr. Sharlett Iles patient. Patient is wanting to transfer back to our office because his son is a patient of Dr. Hilarie Fredrickson. Patient states that he has been having problems with blood in stool. Records placed on Dr. Vena Rua desk for review.

## 2016-10-30 NOTE — Telephone Encounter (Signed)
No, as I suggested to him during the recent office visit, he needs to be seen by GI sooner. Colonoscopy was already recommended to him.

## 2016-10-30 NOTE — Telephone Encounter (Signed)
I spoke with son [name] and told him what Dr Domenic Polite said  To have sooner apt, son will try to convince father to go to Dr Laural Golden

## 2016-10-31 ENCOUNTER — Encounter: Payer: Self-pay | Admitting: Internal Medicine

## 2016-10-31 ENCOUNTER — Telehealth: Payer: Self-pay | Admitting: Internal Medicine

## 2016-10-31 ENCOUNTER — Telehealth: Payer: Self-pay | Admitting: Cardiology

## 2016-10-31 NOTE — Telephone Encounter (Signed)
Spoke with pt's son. Faxed an office note to [facility] office notes.

## 2016-10-31 NOTE — Telephone Encounter (Signed)
Patient's son states that patient was scheduled w/ Dr. Hilarie Fredrickson for May 7th but states that Dr.McDowell wanted him seen sooner. States that Dr. Vena Rua office needs more information as to why patient needs to be seen sooner. / tg

## 2016-10-31 NOTE — Telephone Encounter (Signed)
Dr.Pyrtle reviewed records and accepted to see patient. Patient has been scheduled for soonest office visit with Dr.Pyrtle.

## 2016-10-31 NOTE — Telephone Encounter (Signed)
Pts appointment rescheduled to Monday 11/05/16@4pm . Pts son aware of appt and will notify pt of appt.

## 2016-11-02 ENCOUNTER — Telehealth: Payer: Self-pay | Admitting: Cardiology

## 2016-11-02 NOTE — Telephone Encounter (Signed)
Patient informed he may take med

## 2016-11-02 NOTE — Telephone Encounter (Signed)
Patient wants to know if it is okay to take Colchicine 6mg  for Gout. / tg

## 2016-11-02 NOTE — Telephone Encounter (Signed)
Will forward to MD.  

## 2016-11-02 NOTE — Telephone Encounter (Signed)
Yes

## 2016-11-05 ENCOUNTER — Ambulatory Visit: Payer: Medicare Other | Admitting: Internal Medicine

## 2016-11-05 ENCOUNTER — Ambulatory Visit (INDEPENDENT_AMBULATORY_CARE_PROVIDER_SITE_OTHER): Payer: Medicare Other | Admitting: Internal Medicine

## 2016-11-05 ENCOUNTER — Encounter: Payer: Self-pay | Admitting: Internal Medicine

## 2016-11-05 ENCOUNTER — Telehealth: Payer: Self-pay | Admitting: *Deleted

## 2016-11-05 VITALS — BP 138/74 | HR 96 | Ht 72.0 in | Wt 199.1 lb

## 2016-11-05 DIAGNOSIS — Z86010 Personal history of colonic polyps: Secondary | ICD-10-CM | POA: Diagnosis not present

## 2016-11-05 DIAGNOSIS — Z7901 Long term (current) use of anticoagulants: Secondary | ICD-10-CM | POA: Diagnosis not present

## 2016-11-05 DIAGNOSIS — K5520 Angiodysplasia of colon without hemorrhage: Secondary | ICD-10-CM

## 2016-11-05 DIAGNOSIS — R195 Other fecal abnormalities: Secondary | ICD-10-CM

## 2016-11-05 DIAGNOSIS — Q2733 Arteriovenous malformation of digestive system vessel: Secondary | ICD-10-CM

## 2016-11-05 DIAGNOSIS — D509 Iron deficiency anemia, unspecified: Secondary | ICD-10-CM | POA: Diagnosis not present

## 2016-11-05 MED ORDER — NA SULFATE-K SULFATE-MG SULF 17.5-3.13-1.6 GM/177ML PO SOLN: ORAL | 0 refills | Status: DC

## 2016-11-05 NOTE — Patient Instructions (Signed)
You have been scheduled for a colonoscopy. Please follow written instructions given to you at your visit today.  Please pick up your prep supplies at the pharmacy within the next 1-3 days. If you use inhalers (even only as needed), please bring them with you on the day of your procedure. Your physician has requested that you go to www.startemmi.com and enter the access code given to you at your visit today. This web site gives a general overview about your procedure. However, you should still follow specific instructions given to you by our office regarding your preparation for the procedure.  You will be contacted by our office prior to your procedure for directions on holding your Eliquis.  If you do not hear from our office 1 week prior to your scheduled procedure, please call [phone] to discuss.   If you are age 13 or older, your body mass index should be between 23-30. Your Body mass index is 27.01 kg/m. If this is out of the aforementioned range listed, please consider follow up with your Primary Care Provider.  If you are age 52 or younger, your body mass index should be between 19-25. Your Body mass index is 27.01 kg/m. If this is out of the aformentioned range listed, please consider follow up with your Primary Care Provider.

## 2016-11-05 NOTE — Telephone Encounter (Signed)
   Ronald Colon [birth year] [id number]  Dear Dr Domenic Polite:  We have scheduled the above named patient for a(n) endoscopy and colonoscopy procedure. Our records show that (s)he is on anticoagulation therapy.  Please advise as to whether the patient may come of their therapy of Eliquis 5 days prior to their procedure which is to be schedule  Please route your response to Dixon Boos, [place] or fax response to [phone].  Sincerely,   Lajuan Lines. Hilarie Fredrickson, MD [facility]

## 2016-11-06 ENCOUNTER — Telehealth: Payer: Self-pay | Admitting: *Deleted

## 2016-11-06 NOTE — Progress Notes (Signed)
Patient ID: LOGIN MUCKLEROY, male   DOB: 1929-02-28, 83 y.o.   MRN: [id number] HPI: Mr. Ronald Colon is an 83 year old male with a past medical history of atrial fibrillation on Eliquis, aortic stenosis, colon polyps, colonic AVM who seen in consultation at the request of Dr. Hilma Favors to evaluate heme-positive stools, anemia with iron deficiency. He is here with his son today.  Patient reports that he has had heme positive stool and also anemia all becoming an issue since starting Eliquis. He has not seen visible blood in his stool or melena. They did place him on oral iron and since this time he has had dark stools. He reports no issues with abdominal pain. He denies diarrhea and constipation. He did have loose stools associated with colchicine which he was using for gout. He is off of this medicine now and diarrhea resolved. He reports a good appetite, stable weight. Denies dysphagia and odynophagia. No trouble with nausea or vomiting.  Of note he bought an over-the-counter occult blood test from a local pharmacy and has tested his stool his son estimates 15 times. Several of these have been positive at home along with the positive test by primary care from March 2018.  He reports a history of remote peptic ulcer disease but this was diagnosed clinically.  He had an EGD in January 2015 which showed atrophic gastritis with metaplasia. There was no H. pylori. He had a colonoscopy with Dr. Sharlett Iles performed in June 2013. This revealed diverticular disease in the left colon. There was a polyp removed from the transverse colon and rectum. This was found to be tubular adenoma. 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 These were not bleeding at that time.    Past Medical History:  Diagnosis Date  . Aortic stenosis    Mild February 2018  . Arthritis   . Atrial fibrillation [facility])    Diagnosed January 2018  . Carotid stenosis   . Colon polyp   . Diverticulosis   . Essential hypertension, benign    . Gout   . Hyperlipidemia   . Rheumatic fever 1937  . Stroke [facility]) 09/10/2016    Past Surgical History:  Procedure Laterality Date  . CATARACT EXTRACTION W/PHACO Right 08/02/2014   Procedure: CATARACT EXTRACTION PHACO AND INTRAOCULAR LENS PLACEMENT; CDE:  12.77;  Surgeon: Williams Che, MD;  Location: [facility];  Service: Ophthalmology;  Laterality: Right;  . CATARACT EXTRACTION W/PHACO Left 02/15/2015   Procedure: CATARACT EXTRACTION PHACO AND INTRAOCULAR LENS PLACEMENT (IOC);  Surgeon: Williams Che, MD;  Location: [facility];  Service: Ophthalmology;  Laterality: Left;  CDE 18.00  . COLONOSCOPY    . ELBOW SURGERY Right    "grissle in there"  . ENDARTERECTOMY Left 09/17/2016   Procedure: LEFT CAROTID ENDARTECTOMY;  Surgeon: Waynetta Sandy, MD;  Location: [place];  Service: Vascular;  Laterality: Left;  . ESOPHAGEAL DILATION  09/03/2013   Procedure: ESOPHAGEAL DILATION;  Surgeon: Rogene Houston, MD;  [facility];  Service: Endoscopy;;  . ESOPHAGOGASTRODUODENOSCOPY N/A 09/03/2013   Procedure: ESOPHAGOGASTRODUODENOSCOPY (EGD);  Surgeon: Rogene Houston, MD;  [facility];  Service: Endoscopy;  Laterality: N/A;  255-rescheduled to 1030 Ann to notify pt  . PATCH ANGIOPLASTY Left 09/17/2016   Procedure: PATCH ANGIOPLASTY WITH Rueben Bash BIOLOGIC PATCH 1 CM X6 CM;  Surgeon: Waynetta Sandy, MD;  Location: [place];  Service: Vascular;  Laterality: Left;    Outpatient Medications Prior to Visit  Medication Sig  Dispense Refill  . allopurinol (ZYLOPRIM) 300 MG tablet Take 300 mg by mouth daily.    Marland Kitchen amiodarone (PACERONE) 200 MG tablet Take 1 tablet (200 mg total) by mouth daily. 30 tablet 3  . apixaban (ELIQUIS) 5 MG TABS tablet Take 1 tablet (5 mg total) by mouth 2 (two) times daily. 60 tablet 0  . atorvastatin (LIPITOR) 40 MG tablet Take 1 tablet (40 mg total) by mouth daily at 6 PM. 30 tablet 11  . ezetimibe (ZETIA) 10 MG tablet Take 10 mg by mouth daily.    .  ferrous sulfate 325 (65 FE) MG tablet Take 325 mg by mouth 2 (two) times daily with a meal.    . fish oil-omega-3 fatty acids 1000 MG capsule Take 1 g by mouth daily.    . metoprolol tartrate (LOPRESSOR) 25 MG tablet Take 1 tablet (25 mg total) by mouth 2 (two) times daily. 60 tablet 3  . pantoprazole (PROTONIX) 20 MG tablet Take 1 tablet (20 mg total) by mouth daily. 90 tablet 3   No facility-administered medications prior to visit.     Allergies  Allergen Reactions  . No Known Allergies     Family History  Problem Relation Age of Onset  . CVA Mother 63  . Pneumonia Father 67  . Colon cancer Neg Hx     Social History  Substance Use Topics  . Smoking status: Former Smoker    Packs/day: 1.00    Years: 20.00    Types: Cigarettes    Quit date: 6  . Smokeless tobacco: Never Used  . Alcohol use No    ROS: As per history of present illness, otherwise negative  BP 138/74 (BP Location: Left Arm, Patient Position: Sitting, Cuff Size: Normal)   Pulse 96 Comment: irregular  Ht 6' (1.829 m) Comment: height measured without shoes  Wt 199 lb 2 oz (90.3 kg)   BMI 27.01 kg/m  Constitutional: Well-developed and well-nourished. No distress. HEENT: Normocephalic and atraumatic. Oropharynx is clear and moist. No oropharyngeal exudate. Conjunctivae are normal.  No scleral icterus. Neck: Neck supple. Trachea midline. Cardiovascular: Regularly irregular with a soft systolic ejection murmur Pulmonary/chest: Effort normal and breath sounds normal. No wheezing, rales or rhonchi. Abdominal: Soft, nontender, nondistended. Bowel sounds active throughout.  Extremities: no clubbing, cyanosis, or edema  Neurological: Alert and oriented to person place and time. Skin: Skin is warm and dry. Psychiatric: Normal mood and affect. Behavior is normal.  RELEVANT LABS AND IMAGING: CBC    Component Value Date/Time   WBC 8.1 10/15/2016 0924   RBC 3.61 (L) 10/15/2016 0924   HGB 11.3 (L) 10/15/2016  0924   HCT 34.5 (L) 10/15/2016 0924   PLT 256 10/15/2016 0924   MCV 95.6 10/15/2016 0924   MCH 31.3 10/15/2016 0924   MCHC 32.8 10/15/2016 0924   RDW 14.9 10/15/2016 0924   LYMPHSABS 1.6 10/02/2016 1115   MONOABS 0.8 10/02/2016 1115   EOSABS 0.2 10/02/2016 1115   BASOSABS 0.1 10/02/2016 1115    CMP     Component Value Date/Time   NA 138 10/02/2016 1130   K 4.1 10/02/2016 1130   CL 102 10/02/2016 1130   CO2 25 10/02/2016 1115   GLUCOSE 121 (H) 10/02/2016 1130   BUN 18 10/02/2016 1130   CREATININE 1.40 (H) 10/02/2016 1130   CALCIUM 9.3 10/02/2016 1115   PROT 6.6 10/02/2016 1115   ALBUMIN 3.6 10/02/2016 1115   AST 21 10/02/2016 1115   ALT 22 10/02/2016 1115  ALKPHOS 50 10/02/2016 1115   BILITOT 0.7 10/02/2016 1115   GFRNONAA 46 (L) 10/02/2016 1115   GFRAA 53 (L) 10/02/2016 1115  Results for TOBI, LEINWEBER (MRN [id number]) as of 11/06/2016 17:36  Ref. Range 09/14/2016 09:25 09/15/2016 04:38 09/16/2016 02:37 09/17/2016 05:43 09/18/2016 04:39 09/19/2016 02:17 10/02/2016 11:15 10/02/2016 11:30 10/11/2016 13:47 10/15/2016 09:24  Hemoglobin Latest Ref Range: 13.2 - 17.1 g/dL 13.3 12.5 (L) 12.3 (L) 13.3 10.9 (L) 11.0 (L) 11.2 (L) 10.9 (L) 11.7 (L) 11.3 (L)  HCT Latest Ref Range: 38.5 - 50.0 % 40.4 38.2 (L) 37.4 (L) 40.1 32.5 (L) 33.9 (L) 34.0 (L) 32.0 (L) 35.2 (L) 34.5 (L)    ASSESSMENT/PLAN:  83 year old male with a past medical history of atrial fibrillation on Eliquis, aortic stenosis, colon polyps, colonic AVM who seen in consultation at the request of Dr. Hilma Favors to evaluate heme-positive stools, anemia with iron deficiency.  1. Iron deficiency anemia with heme-positive stool in the setting of chronic anticoagulation with Eliquis -- with a long discussion today regarding GI evaluation of GI bleeding. Potential sources of blood loss include gastritis/peptic ulcer disease (he has a history of atrophic gastritis and metaplasia), known cecal angiodysplastic lesions (also potential for other  angiodysplastic lesions in the small bowel or large bowel), colon polyps (less likely malignancy given colonoscopy in 2013).  We discussed continuing iron supplementation with close monitoring of hemoglobin and iron studies versus proceeding to endoscopies. Dr. Domenic Polite, his cardiologist feels like endoscopy should be considered and I agree. After the discussion with he, and his son, regarding the risks, benefits and alternatives to upper and lower endoscopy he is agreeable and wishes to proceed. --Performed the studies in the outpatient hospital setting off of Eliquis with permission from Dr. Domenic Polite. The hospital setting off first the availability of APC if angiodysplastic lesions are found as seen previously at colonoscopy. --Tinea oral iron supplementation for now  Will hold Eliquis 5 days prior to endoscopic procedures - will instruct when and how to resume after procedure. Benefits and risks of procedure explained including risks of bleeding, perforation, infection, missed lesions, reactions to medications and possible need for hospitalization and surgery for complications. Additional rare but real risk of stroke or other vascular clotting events off Eliquis also explained and need to seek urgent help if any signs of these problems occur. Will communicate by phone or EMR with patient's  prescribing provider to confirm that holding Eliquis is reasonable in this case.      FM:BWGY Golding, [address] [place], [place] [address]

## 2016-11-06 NOTE — Telephone Encounter (Signed)
Ronald Colon son, Ronald Colon has been advised (as requested yesterday by patient, Ronald Colon) that Dr Domenic Polite has okayed patient to hold Eliquis 48 hours prior to his procedure. Procedure is scheduled for earliest available hospital date, 12-13-16. Ronald Colon verbalizes understanding and has requested that we make specifically make Dr Domenic Polite aware of colonoscopy date, 12/13/16. Will forward this note to let him know.

## 2016-11-06 NOTE — Telephone Encounter (Signed)
I have spoken to Ronald Colon, patient's son and caregiver (per patient, Ronald Colon request while in our office yesterday) to give verbal instructions for colonoscopy at [facility] on 12/13/16 @ 10:15 am with 8:45 am arrival. He was also given detailed prep instructions and was given instructions to have patient hold eliquis 2 days prior to procedure. Time was given for questions and answers were given. He verbalizes understanding of all of the above.  Orders were placed in EPIC already and procedure was scheduled with Sharee Pimple in endoscopy.

## 2016-11-06 NOTE — Telephone Encounter (Signed)
Yes, Eliquis may be held temporarily as requested. In general, greater than 48 hours is sufficient in most cases.

## 2016-11-23 ENCOUNTER — Telehealth: Payer: Self-pay | Admitting: Internal Medicine

## 2016-11-23 NOTE — Telephone Encounter (Signed)
Spoke with pts son and answered questions regarding prep. Questions were answered.

## 2016-12-12 ENCOUNTER — Encounter (HOSPITAL_COMMUNITY): Payer: Self-pay | Admitting: *Deleted

## 2016-12-13 ENCOUNTER — Ambulatory Visit (HOSPITAL_COMMUNITY): Payer: Medicare Other | Admitting: Certified Registered Nurse Anesthetist

## 2016-12-13 ENCOUNTER — Ambulatory Visit (HOSPITAL_COMMUNITY)
Admission: RE | Admit: 2016-12-13 | Discharge: 2016-12-13 | Disposition: A | Discharge status: Home / Self Care | Payer: Medicare Other | Source: Ambulatory Visit | Attending: Internal Medicine | Admitting: Internal Medicine

## 2016-12-13 ENCOUNTER — Encounter (HOSPITAL_COMMUNITY)
Admission: RE | Disposition: A | Discharge status: Home / Self Care | Payer: Self-pay | Source: Ambulatory Visit | Attending: Internal Medicine

## 2016-12-13 ENCOUNTER — Encounter (HOSPITAL_COMMUNITY): Payer: Self-pay | Admitting: *Deleted

## 2016-12-13 DIAGNOSIS — D123 Benign neoplasm of transverse colon: Secondary | ICD-10-CM

## 2016-12-13 DIAGNOSIS — Z8719 Personal history of other diseases of the digestive system: Secondary | ICD-10-CM | POA: Diagnosis not present

## 2016-12-13 DIAGNOSIS — D124 Benign neoplasm of descending colon: Secondary | ICD-10-CM | POA: Diagnosis not present

## 2016-12-13 DIAGNOSIS — I10 Essential (primary) hypertension: Secondary | ICD-10-CM | POA: Diagnosis not present

## 2016-12-13 DIAGNOSIS — D120 Benign neoplasm of cecum: Secondary | ICD-10-CM

## 2016-12-13 DIAGNOSIS — I4891 Unspecified atrial fibrillation: Secondary | ICD-10-CM | POA: Diagnosis not present

## 2016-12-13 DIAGNOSIS — D509 Iron deficiency anemia, unspecified: Secondary | ICD-10-CM | POA: Diagnosis not present

## 2016-12-13 DIAGNOSIS — I350 Nonrheumatic aortic (valve) stenosis: Secondary | ICD-10-CM | POA: Insufficient documentation

## 2016-12-13 DIAGNOSIS — D122 Benign neoplasm of ascending colon: Secondary | ICD-10-CM | POA: Diagnosis not present

## 2016-12-13 DIAGNOSIS — K5730 Diverticulosis of large intestine without perforation or abscess without bleeding: Secondary | ICD-10-CM | POA: Diagnosis not present

## 2016-12-13 DIAGNOSIS — Z87891 Personal history of nicotine dependence: Secondary | ICD-10-CM | POA: Diagnosis not present

## 2016-12-13 DIAGNOSIS — Z8673 Personal history of transient ischemic attack (TIA), and cerebral infarction without residual deficits: Secondary | ICD-10-CM | POA: Diagnosis not present

## 2016-12-13 DIAGNOSIS — Q2733 Arteriovenous malformation of digestive system vessel: Secondary | ICD-10-CM | POA: Diagnosis not present

## 2016-12-13 DIAGNOSIS — I739 Peripheral vascular disease, unspecified: Secondary | ICD-10-CM | POA: Insufficient documentation

## 2016-12-13 DIAGNOSIS — K5520 Angiodysplasia of colon without hemorrhage: Secondary | ICD-10-CM

## 2016-12-13 DIAGNOSIS — Z86010 Personal history of colonic polyps: Secondary | ICD-10-CM | POA: Diagnosis not present

## 2016-12-13 DIAGNOSIS — R195 Other fecal abnormalities: Secondary | ICD-10-CM

## 2016-12-13 HISTORY — PX: COLONOSCOPY WITH PROPOFOL: SHX5780

## 2016-12-13 HISTORY — PX: ESOPHAGOGASTRODUODENOSCOPY (EGD) WITH PROPOFOL: SHX5813

## 2016-12-13 SURGERY — COLONOSCOPY WITH PROPOFOL
Anesthesia: Monitor Anesthesia Care

## 2016-12-13 MED ORDER — PROPOFOL 10 MG/ML IV BOLUS
INTRAVENOUS | Status: AC
  Filled 2016-12-13: qty 60

## 2016-12-13 MED ORDER — SODIUM CHLORIDE 0.9 % IV SOLN
INTRAVENOUS | Status: DC
  Administered 2016-12-13: 09:00:00 via INTRAVENOUS

## 2016-12-13 MED ORDER — LACTATED RINGERS IV SOLN: INTRAVENOUS | Status: DC | PRN

## 2016-12-13 MED ORDER — ONDANSETRON HCL 4 MG/2ML IJ SOLN
INTRAMUSCULAR | Status: DC | PRN
  Administered 2016-12-13: 4 mg via INTRAVENOUS

## 2016-12-13 MED ORDER — ONDANSETRON HCL 4 MG/2ML IJ SOLN
INTRAMUSCULAR | Status: AC
Start: 2016-12-13 — End: ?
  Filled 2016-12-13: qty 2

## 2016-12-13 MED ORDER — LIDOCAINE 2% (20 MG/ML) 5 ML SYRINGE
INTRAMUSCULAR | Status: AC
  Filled 2016-12-13: qty 5

## 2016-12-13 MED ORDER — PROPOFOL 500 MG/50ML IV EMUL
INTRAVENOUS | Status: DC | PRN
  Administered 2016-12-13: 50 ug/kg/min via INTRAVENOUS

## 2016-12-13 MED ORDER — LIDOCAINE 2% (20 MG/ML) 5 ML SYRINGE
INTRAMUSCULAR | Status: DC | PRN
  Administered 2016-12-13: 100 mg via INTRAVENOUS

## 2016-12-13 MED ORDER — PROPOFOL 10 MG/ML IV BOLUS
INTRAVENOUS | Status: DC | PRN
  Administered 2016-12-13 (×2): 20 mg via INTRAVENOUS
  Administered 2016-12-13: 10 mg via INTRAVENOUS

## 2016-12-13 SURGICAL SUPPLY — 24 items

## 2016-12-13 NOTE — H&P (Signed)
HPI: Ronald Colon is an 83 year old male with history of atrial fibrillation on Eliquis, aortic stenosis, history of colon polyps, history of colon AVMs here today for upper endoscopy and colonoscopy to evaluate iron deficiency anemia and heme positive stool. He was seen in the office on 11/05/2016. He has been feeling well. Stopped his Eliquis appropriately. No new symptoms or complaints today  Past Medical History:  Diagnosis Date  . Aortic stenosis    Mild February 2018  . Arthritis   . Atrial fibrillation [facility])    Diagnosed January 2018  . Carotid stenosis   . Colon polyp   . Diverticulosis   . Essential hypertension, benign   . Gout   . Hyperlipidemia   . Rheumatic fever 1937  . Stroke [facility]) 09/10/2016    Past Surgical History:  Procedure Laterality Date  . CATARACT EXTRACTION W/PHACO Right 08/02/2014   Procedure: CATARACT EXTRACTION PHACO AND INTRAOCULAR LENS PLACEMENT; CDE:  12.77;  Surgeon: Williams Che, MD;  Location: [facility];  Service: Ophthalmology;  Laterality: Right;  . CATARACT EXTRACTION W/PHACO Left 02/15/2015   Procedure: CATARACT EXTRACTION PHACO AND INTRAOCULAR LENS PLACEMENT (IOC);  Surgeon: Williams Che, MD;  Location: [facility];  Service: Ophthalmology;  Laterality: Left;  CDE 18.00  . COLONOSCOPY    . ELBOW SURGERY Right    "grissle in there"  . ENDARTERECTOMY Left 09/17/2016   Procedure: LEFT CAROTID ENDARTECTOMY;  Surgeon: Waynetta Sandy, MD;  Location: [place];  Service: Vascular;  Laterality: Left;  . ESOPHAGEAL DILATION  09/03/2013   Procedure: ESOPHAGEAL DILATION;  Surgeon: Rogene Houston, MD;  [facility];  Service: Endoscopy;;  . ESOPHAGOGASTRODUODENOSCOPY N/A 09/03/2013   Procedure: ESOPHAGOGASTRODUODENOSCOPY (EGD);  Surgeon: Rogene Houston, MD;  [facility];  Service: Endoscopy;  Laterality: N/A;  255-rescheduled to 1030 Ann to notify pt  . PATCH ANGIOPLASTY Left 09/17/2016   Procedure: PATCH ANGIOPLASTY WITH Rueben Bash  BIOLOGIC PATCH 1 CM X6 CM;  Surgeon: Waynetta Sandy, MD;  Location: [facility] OR;  Service: Vascular;  Laterality: Left;     (Not in an outpatient encounter)  No Known Allergies  Family History  Problem Relation Age of Onset  . CVA Mother 77  . Pneumonia Father 9  . Colon cancer Neg Hx     Social History  Substance Use Topics  . Smoking status: Former Smoker    Packs/day: 1.00    Years: 20.00    Types: Cigarettes    Quit date: 78  . Smokeless tobacco: Never Used  . Alcohol use No    ROS: As per history of present illness, otherwise negative  BP (!) 189/66   Pulse (!) 56   Temp 97.6 F (36.4 C) (Oral)   Resp 15   Ht 6\' 2"  (1.88 m)   Wt 202 lb (91.6 kg)   SpO2 100%   BMI 25.94 kg/m  Gen: awake, alert, NAD HEENT: anicteric, op clear CV: RRR, 2/6 sem Pulm: CTA b/l Abd: soft, NT/ND, +BS throughout Ext: no c/c/e Neuro: nonfocal  RELEVANT LABS AND IMAGING: CBC    Component Value Date/Time   WBC 8.1 10/15/2016 0924   RBC 3.61 (L) 10/15/2016 0924   HGB 11.3 (L) 10/15/2016 0924   HCT 34.5 (L) 10/15/2016 0924   PLT 256 10/15/2016 0924   MCV 95.6 10/15/2016 0924   MCH 31.3 10/15/2016 0924   MCHC 32.8 10/15/2016 0924   RDW 14.9 10/15/2016 0924   LYMPHSABS 1.6 10/02/2016 1115  MONOABS 0.8 10/02/2016 1115   EOSABS 0.2 10/02/2016 1115   BASOSABS 0.1 10/02/2016 1115    CMP     Component Value Date/Time   NA 138 10/02/2016 1130   K 4.1 10/02/2016 1130   CL 102 10/02/2016 1130   CO2 25 10/02/2016 1115   GLUCOSE 121 (H) 10/02/2016 1130   BUN 18 10/02/2016 1130   CREATININE 1.40 (H) 10/02/2016 1130   CALCIUM 9.3 10/02/2016 1115   PROT 6.6 10/02/2016 1115   ALBUMIN 3.6 10/02/2016 1115   AST 21 10/02/2016 1115   ALT 22 10/02/2016 1115   ALKPHOS 50 10/02/2016 1115   BILITOT 0.7 10/02/2016 1115   GFRNONAA 46 (L) 10/02/2016 1115   GFRAA 53 (L) 10/02/2016 1115    ASSESSMENT/PLAN: 83 year old male with history of atrial fibrillation on Eliquis,  aortic stenosis, history of colon polyps, history of colon AVMs here today for upper endoscopy and colonoscopy to evaluate iron deficiency anemia and heme positive stool.   1. IDA with heme-positive stool -- plan for EGD and colonoscopy today. For possible therapy of any angiodysplastic lesions seen and possible polypectomy given history of colon polyps. Stomach evaluation given history of gastritis and gastric metaplasia. --HIGHER THAN BASELINE RISK.The nature of the procedure, as well as the risks, benefits, and alternatives were carefully and thoroughly reviewed with the patient. Ample time for discussion and questions allowed. The patient understood, was satisfied, and agreed to proceed.

## 2016-12-13 NOTE — Transfer of Care (Signed)
Immediate Anesthesia Transfer of Care Note  Patient: DODD SCHMID  Procedure(s) Performed: Procedure(s): COLONOSCOPY WITH PROPOFOL (N/A) ESOPHAGOGASTRODUODENOSCOPY (EGD) WITH PROPOFOL (N/A)  Patient Location: ENDO  Anesthesia Type:MAC  Level of Consciousness:  sedated, patient cooperative and responds to stimulation  Airway & Oxygen Therapy:Patient Spontanous Breathing and Patient connected to face mask oxgen  Post-op Assessment:  Report given to ENDO RN and Post -op Vital signs reviewed and stable  Post vital signs:  Reviewed and stable  Last Vitals:  Vitals:   12/13/16 0830  BP: (!) 189/66  Pulse: (!) 56  Resp: 15  Temp: 37.0 C    Complications: No apparent anesthesia complications

## 2016-12-13 NOTE — Op Note (Signed)
[facility] Patient Name: Ronald Colon Procedure Date: 12/13/2016 MRN: [id number] Attending MD: Jerene Bears , MD Date of Birth: 10/06/1928 CSN: [id number] Age: 83 Admit Type: Outpatient Procedure:                Upper GI endoscopy Indications:              Iron deficiency anemia Providers:                Lajuan Lines. Hilarie Fredrickson, MD, Laverta Baltimore RN, RN, Cherylynn Ridges, Technician, Alan Mulder, Technician,                            Adair Laundry, CRNA Referring MD:             Halford Chessman MD, MD Medicines:                Monitored Anesthesia Care Complications:            No immediate complications. Estimated Blood Loss:     Estimated blood loss: none. Procedure:                Pre-Anesthesia Assessment:                           - Prior to the procedure, a History and Physical                            was performed, and patient medications and                            allergies were reviewed. The patient's tolerance of                            previous anesthesia was also reviewed. The risks                            and benefits of the procedure and the sedation                            options and risks were discussed with the patient.                            All questions were answered, and informed consent                            was obtained. Prior Anticoagulants: The patient has                            taken Eliquis (apixaban), last dose was 3 days                            prior to procedure. ASA Grade Assessment: III - A  patient with severe systemic disease. After                            reviewing the risks and benefits, the patient was                            deemed in satisfactory condition to undergo the                            procedure.                           After obtaining informed consent, the endoscope was                            passed under direct vision.  Throughout the                            procedure, the patient's blood pressure, pulse, and                            oxygen saturations were monitored continuously. The                            [id number] ([id number]) scope was introduced through the                            mouth, and advanced to the second part of duodenum.                            The upper GI endoscopy was accomplished without                            difficulty. The patient tolerated the procedure                            well. Scope In: Scope Out: Findings:      The examined esophagus was normal.      The cardia and gastric fundus were normal on retroflexion.      The entire examined stomach was normal.      The examined duodenum was normal. Impression:               - Normal esophagus.                           - Normal stomach.                           - Normal examined duodenum.                           - No specimens collected. Moderate Sedation:      N/A Recommendation:           - Patient has a contact number available for  emergencies. The signs and symptoms of potential                            delayed complications were discussed with the                            patient. Return to normal activities tomorrow.                            Written discharge instructions were provided to the                            patient.                           - Resume previous diet.                           - Continue present medications.                           - See the other procedure note for documentation of                            additional recommendations. Procedure Code(s):        --- Professional ---                           [id number], Esophagogastroduodenoscopy, flexible,                            transoral; diagnostic, including collection of                            specimen(s) by brushing or washing, when performed                            (separate  procedure) Diagnosis Code(s):        --- Professional ---                           D50.9, Iron deficiency anemia, unspecified CPT copyright 2016 [employer]. All rights reserved. The codes documented in this report are preliminary and upon coder review may  be revised to meet current compliance requirements. Jerene Bears, MD 12/13/2016 11:17:24 AM This report has been signed electronically. Number of Addenda: 0

## 2016-12-13 NOTE — Op Note (Signed)
[facility] Patient Name: Ronald Colon Procedure Date: 12/13/2016 MRN: [id number] Attending MD: Jerene Bears , MD Date of Birth: 1929-01-05 CSN: [id number] Age: 83 Admit Type: Outpatient Procedure:                Colonoscopy Indications:              Iron deficiency anemia Providers:                Lajuan Lines. Hilarie Fredrickson, MD, Laverta Baltimore RN, RN, Cherylynn Ridges, Technician, Alan Mulder, Technician,                            Adair Laundry, CRNA Referring MD:             Halford Chessman MD, MD Medicines:                Monitored Anesthesia Care Complications:            No immediate complications. Estimated Blood Loss:     Estimated blood loss was minimal. Procedure:                Pre-Anesthesia Assessment:                           - Prior to the procedure, a History and Physical                            was performed, and patient medications and                            allergies were reviewed. The patient's tolerance of                            previous anesthesia was also reviewed. The risks                            and benefits of the procedure and the sedation                            options and risks were discussed with the patient.                            All questions were answered, and informed consent                            was obtained. Prior Anticoagulants: The patient has                            taken Eliquis (apixaban), last dose was 3 days                            prior to procedure. ASA Grade Assessment: III - A  patient with severe systemic disease. After                            reviewing the risks and benefits, the patient was                            deemed in satisfactory condition to undergo the                            procedure.                           After obtaining informed consent, the colonoscope                            was passed under direct vision.  Throughout the                            procedure, the patient's blood pressure, pulse, and                            oxygen saturations were monitored continuously. The                            EC-[id number] ([id number]) scope was introduced through                            the anus and advanced to the the terminal ileum.                            The colonoscopy was performed without difficulty.                            The patient tolerated the procedure well. The                            quality of the bowel preparation was good. The                            terminal ileum, ileocecal valve, appendiceal                            orifice, and rectum were photographed. Scope In: 10:44:38 AM Scope Out: 11:11:33 AM Scope Withdrawal Time: 0 hours 21 minutes 52 seconds  Total Procedure Duration: 0 hours 26 minutes 55 seconds  Findings:      The digital rectal exam was normal.      The terminal ileum appeared normal.      A 8 mm polyp was found in the cecum. The polyp was sessile. The polyp       was removed with a cold snare. Resection and retrieval were complete.      A 3 mm polyp was found in the ascending colon. The polyp was sessile.       The polyp was removed with a cold biopsy forceps. Resection and  retrieval were complete.      Five sessile polyps were found in the ascending colon. The polyps were 4       to 7 mm in size. These polyps were removed with a cold snare. Resection       and retrieval were complete.      A single localized angiodysplastic lesion with typical arborization was       found in the ascending colon. Fulguration to ablate the lesion to       prevent bleeding by argon beam with Erbe at right colon setting was       successful.      Three sessile polyps were found in the transverse colon. The polyps were       4 to 7 mm in size. These polyps were removed with a cold snare.       Resection and retrieval were complete.      A 8 mm polyp was found in  the descending colon. The polyp was sessile.       The polyp was removed with a cold snare. Resection and retrieval were       complete.      Multiple small and large-mouthed diverticula were found in the sigmoid       colon, descending colon and transverse colon. There was narrowing of the       colon in association with the diverticular opening.      Internal hemorrhoids were found during retroflexion. The hemorrhoids       were small. Impression:               - The examined portion of the ileum was normal.                           - One 8 mm polyp in the cecum, removed with a cold                            snare. Resected and retrieved.                           - One 3 mm polyp in the ascending colon, removed                            with a cold biopsy forceps. Resected and retrieved.                           - Five 4 to 7 mm polyps in the ascending colon,                            removed with a cold snare. Resected and retrieved.                           - A single colonic angiodysplastic lesion. Treated                            with argon beam coagulation.                           - Three 4 to 7 mm polyps in  the transverse colon,                            removed with a cold snare. Resected and retrieved.                           - One 8 mm polyp in the descending colon, removed                            with a cold snare. Resected and retrieved.                           - Mild diverticulosis in the sigmoid colon, in the                            descending colon and in the transverse colon. There                            was narrowing of the colon in association with the                            diverticular opening.                           - Internal hemorrhoids. Moderate Sedation:      N/A Recommendation:           - Patient has a contact number available for                            emergencies. The signs and symptoms of potential                             delayed complications were discussed with the                            patient. Return to normal activities tomorrow.                            Written discharge instructions were provided to the                            patient.                           - Resume previous diet.                           - Continue present medications. Hold Eliquis until                            Monday, 12/17/2016, given APC ablation and                            polypectomy performed today.                           -  Await pathology results.                           - No repeat colonoscopy due to age.                           - Monitor Hgb and iron studies. Continue oral iron                            replacement. Procedure Code(s):        --- Professional ---                           [phone], 59, Colonoscopy, flexible; with control of                            bleeding, any method                           45385, Colonoscopy, flexible; with removal of                            tumor(s), polyp(s), or other lesion(s) by snare                            technique                           45380, 43, Colonoscopy, flexible; with biopsy,                            single or multiple Diagnosis Code(s):        --- Professional ---                           K64.8, Other hemorrhoids                           D12.0, Benign neoplasm of cecum                           D12.2, Benign neoplasm of ascending colon                           D12.4, Benign neoplasm of descending colon                           D12.3, Benign neoplasm of transverse colon (hepatic                            flexure or splenic flexure)                           K55.20, Angiodysplasia of colon without hemorrhage                           D50.9, Iron deficiency anemia, unspecified  K57.30, Diverticulosis of large intestine without                            perforation or abscess without bleeding CPT  copyright 2016 American Medical Association. All rights reserved. The codes documented in this report are preliminary and upon coder review may  be revised to meet current compliance requirements. Jerene Bears, MD 12/13/2016 11:27:26 AM This report has been signed electronically. Number of Addenda: 0

## 2016-12-13 NOTE — Discharge Instructions (Signed)

## 2016-12-13 NOTE — Anesthesia Preprocedure Evaluation (Signed)
Anesthesia Evaluation  Patient identified by MRN, date of birth, ID band Patient awake    Reviewed: Allergy & Precautions, NPO status , Patient's Chart, lab work & pertinent test results  Airway Mallampati: II  TM Distance: >3 FB Neck ROM: Full    Dental no notable dental hx. (+) Teeth Intact   Pulmonary former smoker,    Pulmonary exam normal breath sounds clear to auscultation       Cardiovascular hypertension, + Peripheral Vascular Disease  Normal cardiovascular exam Rhythm:Regular Rate:Normal     Neuro/Psych CVA    GI/Hepatic GERD  ,  Endo/Other    Renal/GU Renal disease     Musculoskeletal  (+) Arthritis ,   Abdominal   Peds  Hematology   Anesthesia Other Findings   Reproductive/Obstetrics                             Anesthesia Physical Anesthesia Plan  ASA: III  Anesthesia Plan: MAC   Post-op Pain Management:    Induction: Intravenous  Airway Management Planned: Natural Airway  Additional Equipment:   Intra-op Plan:   Post-operative Plan:   Informed Consent: I have reviewed the patients History and Physical, chart, labs and discussed the procedure including the risks, benefits and alternatives for the proposed anesthesia with the patient or authorized representative who has indicated his/her understanding and acceptance.   Dental advisory given  Plan Discussed with: CRNA  Anesthesia Plan Comments:         Anesthesia Quick Evaluation

## 2016-12-13 NOTE — Progress Notes (Signed)
Prior to procedure, pt assessed.  Pt Right eye reddened. Pt made aware.  No complaints of pain.  VSS.  Pt states no changes in vision.  Will continue to monitor.    Laverta Baltimore,  RN

## 2016-12-14 ENCOUNTER — Other Ambulatory Visit: Payer: Self-pay

## 2016-12-14 ENCOUNTER — Telehealth: Payer: Self-pay

## 2016-12-14 DIAGNOSIS — D509 Iron deficiency anemia, unspecified: Secondary | ICD-10-CM

## 2016-12-14 NOTE — Telephone Encounter (Signed)
Pt scheduled to see Dr. Hilarie Fredrickson 02/25/17@11am . Pt should have labs the week before. Orders and reminder in epic.

## 2016-12-14 NOTE — Anesthesia Postprocedure Evaluation (Addendum)
Anesthesia Post Note  Patient: Ronald Colon  Procedure(s) Performed: Procedure(s) (LRB): COLONOSCOPY WITH PROPOFOL (N/A) ESOPHAGOGASTRODUODENOSCOPY (EGD) WITH PROPOFOL (N/A)  Patient location during evaluation: Endoscopy Anesthesia Type: MAC Level of consciousness: awake and alert Pain management: pain level controlled Vital Signs Assessment: post-procedure vital signs reviewed and stable Respiratory status: spontaneous breathing, nonlabored ventilation, respiratory function stable and patient connected to nasal cannula oxygen Cardiovascular status: stable and blood pressure returned to baseline Anesthetic complications: no       Last Vitals:  Vitals:   12/13/16 1155 12/13/16 1200  BP:  (!) 189/68  Pulse: (!) 50 (!) 52  Resp: 11 12  Temp:      Last Pain:  Vitals:   12/13/16 1122  TempSrc: Oral                 [name],[name]

## 2016-12-14 NOTE — Telephone Encounter (Signed)
-----   Message from Jerene Bears, MD sent at 12/13/2016 11:41 AM EDT ----- Regarding: ROV followup with me in 2-3 months (next avail okay) Ferritin, ibc panel and CBC before that visit He should continue oral iron until followup JMP

## 2016-12-15 ENCOUNTER — Encounter: Payer: Self-pay | Admitting: Internal Medicine

## 2016-12-17 ENCOUNTER — Encounter (HOSPITAL_COMMUNITY): Payer: Self-pay | Admitting: Internal Medicine

## 2016-12-17 ENCOUNTER — Ambulatory Visit: Payer: Medicare Other | Admitting: Internal Medicine

## 2016-12-24 ENCOUNTER — Telehealth: Payer: Self-pay | Admitting: Cardiology

## 2016-12-24 NOTE — Telephone Encounter (Signed)
Pt thinks he's beginning to have a GOUT flair up and he'd like to make sure it's ok for him to take Colchicine 0.6 mg please give him a call [phone]

## 2016-12-24 NOTE — Telephone Encounter (Signed)
Patient made aware he can take colchicine

## 2016-12-24 NOTE — Telephone Encounter (Signed)
That's fine

## 2016-12-24 NOTE — Telephone Encounter (Signed)
Will forward to DOD, Dr.Koneswaran

## 2017-01-01 ENCOUNTER — Other Ambulatory Visit (HOSPITAL_COMMUNITY)
Admission: RE | Admit: 2017-01-01 | Discharge: 2017-01-01 | Disposition: A | Discharge status: Home / Self Care | Payer: Medicare Other | Source: Ambulatory Visit | Attending: Cardiology | Admitting: Cardiology

## 2017-01-01 DIAGNOSIS — I481 Persistent atrial fibrillation: Secondary | ICD-10-CM | POA: Diagnosis not present

## 2017-01-01 LAB — CBC
HCT: 34.8 % — ABNORMAL LOW (ref 39.0–52.0)
Hemoglobin: 11.5 g/dL — ABNORMAL LOW (ref 13.0–17.0)
MCH: 31.9 pg (ref 26.0–34.0)
MCHC: 33 g/dL (ref 30.0–36.0)
MCV: 96.7 fL (ref 78.0–100.0)
PLATELETS: 242 10*3/uL (ref 150–400)
RBC: 3.6 MIL/uL — ABNORMAL LOW (ref 4.22–5.81)
RDW: 14.8 % (ref 11.5–15.5)
WBC: 7.5 10*3/uL (ref 4.0–10.5)

## 2017-01-07 NOTE — Progress Notes (Signed)
Cardiology Office Note  Date: 01/08/2017   ID: Ronald Colon, Ronald Colon [birth year], MRN [id number]  PCP: [name], MD  Primary Cardiologist: [name], MD   Chief Complaint  Patient presents with  . Atrial Fibrillation    History of Present Illness: Ronald Colon is an 83 y.o. male last seen in March. He is here today with his son. States that he has been doing fairly well. No palpitations or chest pain.  He is following with Dr. Hilarie Fredrickson for GI evaluation. Recent EGD was normal. Colonoscopy showed several polyps that were resected, an angiodysplasic lesion that was ablated, and interal hemorrhoids. He is back on Eliquis. Also still taking iron supplements although plans to stop. His most recent hemoglobin was 11.5.  CHADSVASC score is 5. He has a history of recent stroke with severe left carotid artery stenosis status post CEA by Dr. Donzetta Matters in February.                    Past Medical History:  Diagnosis Date  . Aortic stenosis    Mild February 2018  . Arthritis   . Atrial fibrillation [facility])    Diagnosed January 2018  . Carotid stenosis   . Colon polyp   . Diverticulosis   . Essential hypertension, benign   . Gout   . Hyperlipidemia   . Rheumatic fever 1937  . Stroke [facility]) 09/10/2016    Past Surgical History:  Procedure Laterality Date  . CATARACT EXTRACTION W/PHACO Right 08/02/2014   Procedure: CATARACT EXTRACTION PHACO AND INTRAOCULAR LENS PLACEMENT; CDE:  12.77;  Surgeon: Williams Che, MD;  Location: [facility];  Service: Ophthalmology;  Laterality: Right;  . CATARACT EXTRACTION W/PHACO Left 02/15/2015   Procedure: CATARACT EXTRACTION PHACO AND INTRAOCULAR LENS PLACEMENT (IOC);  Surgeon: Williams Che, MD;  Location: [facility];  Service: Ophthalmology;  Laterality: Left;  CDE 18.00  . COLONOSCOPY    . COLONOSCOPY WITH PROPOFOL N/A 12/13/2016   Procedure: COLONOSCOPY WITH PROPOFOL;  Surgeon: Jerene Bears, MD;  Location: [facility];  Service: Gastroenterology;   Laterality: N/A;  . ELBOW SURGERY Right    "grissle in there"  . ENDARTERECTOMY Left 09/17/2016   Procedure: LEFT CAROTID ENDARTECTOMY;  Surgeon: Waynetta Sandy, MD;  Location: [facility];  Service: Vascular;  Laterality: Left;  . ESOPHAGEAL DILATION  09/03/2013   Procedure: ESOPHAGEAL DILATION;  Surgeon: Rogene Houston, MD;  Location: [facility];  Service: Endoscopy;;  . ESOPHAGOGASTRODUODENOSCOPY N/A 09/03/2013   Procedure: ESOPHAGOGASTRODUODENOSCOPY (EGD);  Surgeon: Rogene Houston, MD;  Location[facility];  Service: Endoscopy;  Laterality: N/A;  255-rescheduled to 1030 Ann to notify pt  . ESOPHAGOGASTRODUODENOSCOPY (EGD) WITH PROPOFOL N/A 12/13/2016   Procedure: ESOPHAGOGASTRODUODENOSCOPY (EGD) WITH PROPOFOL;  Surgeon: Jerene Bears, MD;  Location: [facility];  Service: Gastroenterology;  Laterality: N/A;  . PATCH ANGIOPLASTY Left 09/17/2016   Procedure: [place] 1 CM X6 CM;  Surgeon: Waynetta Sandy, MD;  [facility] OR;  Service: Vascular;  Laterality: Left;    Current Outpatient Prescriptions  Medication Sig Dispense Refill  . acetaminophen (TYLENOL) 325 MG tablet Take 650 mg by mouth daily as needed for mild pain or headache.     . allopurinol (ZYLOPRIM) 300 MG tablet Take 300 mg by mouth daily.    Marland Kitchen amiodarone (PACERONE) 200 MG tablet Take 1 tablet (200 mg total) by mouth daily. 30 tablet 3  . apixaban (ELIQUIS) 5 MG  TABS tablet Take 1 tablet (5 mg total) by mouth 2 (two) times daily. 60 tablet 0  . atorvastatin (LIPITOR) 40 MG tablet Take 1 tablet (40 mg total) by mouth daily at 6 PM. 30 tablet 11  . colchicine 0.6 MG tablet Take 0.6 mg by mouth 2 (two) times daily as needed (gout).     Marland Kitchen ezetimibe (ZETIA) 10 MG tablet Take 10 mg by mouth daily.    . ferrous sulfate (FEOSOL) 325 (65 FE) MG tablet Take 325 mg by mouth 2 (two) times daily.    . fish oil-omega-3 fatty acids 1000 MG capsule Take 1 g by mouth daily.    .  pantoprazole (PROTONIX) 20 MG tablet Take 1 tablet (20 mg total) by mouth daily. 90 tablet 3  . metoprolol tartrate (LOPRESSOR) 25 MG tablet Take 0.5 tablets (12.5 mg total) by mouth 2 (two) times daily. 90 tablet 3   No current facility-administered medications for this visit.    Allergies:  Patient has no known allergies.   Social History: The patient  reports that he quit smoking about 38 years ago. His smoking use included Cigarettes. He has a 20.00 pack-year smoking history. He has never used smokeless tobacco. He reports that he does not drink alcohol or use drugs.   ROS:  Please see the history of present illness. Otherwise, complete review of systems is positive for insomnia.  All other systems are reviewed and negative.   Physical Exam: VS:  BP 118/68   Pulse (!) 58   Ht 6\' 2"  (1.88 m)   Wt 197 lb (89.4 kg)   SpO2 98%   BMI 25.29 kg/m , BMI Body mass index is 25.29 kg/m.  Wt Readings from Last 3 Encounters:  01/08/17 197 lb (89.4 kg)  12/13/16 202 lb (91.6 kg)  11/05/16 199 lb 2 oz (90.3 kg)    General: Patient appears comfortable at rest. HEENT: Conjunctiva and lids normal, oropharynx clear with moist mucosa. Neck: Supple, no elevated JVP, left CEA scar, no thyromegaly. Lungs: Clear to auscultation, nonlabored breathing at rest. Cardiac: RRR, no S3, 2/6systolic murmur, no pericardial rub. Abdomen: Soft, nontender, bowel sounds present, no guarding or rebound. Extremities: Trace ankleedema, distal pulses 2+. Skin: Warm and dry. Musculoskeletal: No kyphosis. Neuropsychiatric: Alert and oriented x3, affect grossly appropriate. Mild residual right upper extremity weakness compared to the left.  ECG: I personally reviewed the tracing from 10/02/2016 which showed atrial fibrillation with increased voltage, left anterior fascicular block with IVCD.  Recent Labwork: 09/10/2016: TSH 2.827 10/02/2016: ALT 22; AST 21; BUN 18; Creatinine, Ser 1.40; Potassium 4.1; Sodium  138 01/01/2017: Hemoglobin 11.5; Platelets 242     Component Value Date/Time   CHOL 178 09/11/2016 0252   TRIG 139 09/11/2016 0252   HDL 37 (L) 09/11/2016 0252   CHOLHDL 4.8 09/11/2016 0252   VLDL 28 09/11/2016 0252   LDLCALC 113 (H) 09/11/2016 0252    Other Studies Reviewed Today:  Echocardiogram 09/13/2016: Study Conclusions  - Left ventricle: The cavity size was normal. Wall thickness was increased in a pattern of mild LVH. There was mild focal basal hypertrophy of the septum. Systolic function was normal. The estimated ejection fraction was in the range of 50% to 55%. Wall motion was normal; there were no regional wall motion abnormalities. - Aortic valve: Valve mobility was restricted. There was mild stenosis. There was trivial regurgitation. - Mitral valve: Calcified annulus.  Impressions:  - Normal LV systolic function; calcified aortic valve with mild AS (  mean gradient 11 mmHg); trace AI.  Assessment and Plan:  1. Paroxysmal to persistent atrial fibrillation with CHADSVASC score 5. Heart rate is regular today. He is not reporting any palpitations. Plan to continue amiodarone, reduce Lopressor to 12.5 mg twice daily, and stay on Eliquis.  2. History of stroke and severe left carotid artery stenosis status post CEA in February.  3. GI blood loss anemia, recent workup with Dr. Hilarie Fredrickson noted above. Patient's hemoglobin is up to 11.5 and he plans to stop iron supplements.  4. Hyperlipidemia, on Lipitor.  5. Mild aortic stenosis.  Current medicines were reviewed with the patient today.  Disposition: Follow-up in 4 months.  Signed, Satira Sark, MD, [facility] 01/08/2017 11:45 AM    [facility] [place]. [address] [place], [place] [address] Phone: [phone]; Fax: [phone]

## 2017-01-08 ENCOUNTER — Ambulatory Visit (INDEPENDENT_AMBULATORY_CARE_PROVIDER_SITE_OTHER): Payer: Medicare Other | Admitting: Cardiology

## 2017-01-08 ENCOUNTER — Encounter: Payer: Self-pay | Admitting: Cardiology

## 2017-01-08 VITALS — BP 118/68 | HR 58 | Ht 74.0 in | Wt 197.0 lb

## 2017-01-08 DIAGNOSIS — I481 Persistent atrial fibrillation: Secondary | ICD-10-CM | POA: Diagnosis not present

## 2017-01-08 DIAGNOSIS — Z8673 Personal history of transient ischemic attack (TIA), and cerebral infarction without residual deficits: Secondary | ICD-10-CM | POA: Diagnosis not present

## 2017-01-08 DIAGNOSIS — Z8719 Personal history of other diseases of the digestive system: Secondary | ICD-10-CM

## 2017-01-08 DIAGNOSIS — I350 Nonrheumatic aortic (valve) stenosis: Secondary | ICD-10-CM | POA: Diagnosis not present

## 2017-01-08 DIAGNOSIS — E782 Mixed hyperlipidemia: Secondary | ICD-10-CM | POA: Diagnosis not present

## 2017-01-08 DIAGNOSIS — I6522 Occlusion and stenosis of left carotid artery: Secondary | ICD-10-CM

## 2017-01-08 DIAGNOSIS — I4819 Other persistent atrial fibrillation: Secondary | ICD-10-CM

## 2017-01-08 MED ORDER — METOPROLOL TARTRATE 25 MG PO TABS: 12.5000 mg | ORAL_TABLET | Freq: Two times a day (BID) | ORAL | 3 refills | Status: DC

## 2017-01-08 NOTE — Addendum Note (Signed)
Addended by: Barbarann Ehlers A on: 01/08/2017 11:47 AM   Modules accepted: Orders

## 2017-01-08 NOTE — Patient Instructions (Signed)
Your physician recommends that you schedule a follow-up appointment in:  4 months with Dr Domenic Polite    DECREASE Lopressor to 1/2 tablet (12.5 mg ) twice a day     If you need a refill on your cardiac medications before your next appointment, please call your pharmacy.      Thank you for choosing [facility] !

## 2017-01-09 ENCOUNTER — Other Ambulatory Visit: Payer: Self-pay | Admitting: Vascular Surgery

## 2017-01-11 NOTE — Addendum Note (Signed)
Addendum  created 01/11/17 1437 by Rica Koyanagi, MD   Sign clinical note

## 2017-01-13 ENCOUNTER — Other Ambulatory Visit: Payer: Self-pay | Admitting: Vascular Surgery

## 2017-01-14 NOTE — Addendum Note (Signed)
Addendum  created 01/14/17 1102 by Oleta Mouse, MD   Sign clinical note

## 2017-01-16 ENCOUNTER — Telehealth: Payer: Self-pay | Admitting: Cardiology

## 2017-01-16 NOTE — Telephone Encounter (Signed)
Returned call to pt's son. He stated that he was wanting to make sure that it was ok to decrease his dad's lopressor to 12.5 mg twice daily rather than 25 mg twice daily. I advised him that it was already documented in chart as 12.5 mg twice daily. He stated that he wanted to double check before changing it.

## 2017-01-16 NOTE — Telephone Encounter (Signed)
Please call patient's son about medication concerns. / tg

## 2017-02-12 ENCOUNTER — Encounter: Payer: Self-pay | Admitting: *Deleted

## 2017-02-15 DIAGNOSIS — Z1389 Encounter for screening for other disorder: Secondary | ICD-10-CM | POA: Diagnosis not present

## 2017-02-15 DIAGNOSIS — Z8673 Personal history of transient ischemic attack (TIA), and cerebral infarction without residual deficits: Secondary | ICD-10-CM | POA: Diagnosis not present

## 2017-02-15 DIAGNOSIS — Z6824 Body mass index (BMI) 24.0-24.9, adult: Secondary | ICD-10-CM | POA: Diagnosis not present

## 2017-02-15 DIAGNOSIS — E119 Type 2 diabetes mellitus without complications: Secondary | ICD-10-CM | POA: Diagnosis not present

## 2017-02-15 DIAGNOSIS — I708 Atherosclerosis of other arteries: Secondary | ICD-10-CM | POA: Diagnosis not present

## 2017-02-15 DIAGNOSIS — M1A00X1 Idiopathic chronic gout, unspecified site, with tophus (tophi): Secondary | ICD-10-CM | POA: Diagnosis not present

## 2017-02-15 DIAGNOSIS — D509 Iron deficiency anemia, unspecified: Secondary | ICD-10-CM | POA: Diagnosis not present

## 2017-02-20 ENCOUNTER — Other Ambulatory Visit: Payer: Medicare Other

## 2017-02-20 ENCOUNTER — Telehealth: Payer: Self-pay | Admitting: Internal Medicine

## 2017-02-20 DIAGNOSIS — R710 Precipitous drop in hematocrit: Secondary | ICD-10-CM | POA: Diagnosis not present

## 2017-02-20 DIAGNOSIS — Z1389 Encounter for screening for other disorder: Secondary | ICD-10-CM | POA: Diagnosis not present

## 2017-02-20 DIAGNOSIS — Z6824 Body mass index (BMI) 24.0-24.9, adult: Secondary | ICD-10-CM | POA: Diagnosis not present

## 2017-02-20 NOTE — Telephone Encounter (Signed)
Let him know it is ok to come Monday for labs.

## 2017-02-25 ENCOUNTER — Ambulatory Visit (INDEPENDENT_AMBULATORY_CARE_PROVIDER_SITE_OTHER): Payer: Medicare Other | Admitting: Internal Medicine

## 2017-02-25 ENCOUNTER — Other Ambulatory Visit (INDEPENDENT_AMBULATORY_CARE_PROVIDER_SITE_OTHER): Payer: Medicare Other

## 2017-02-25 ENCOUNTER — Encounter: Payer: Self-pay | Admitting: Internal Medicine

## 2017-02-25 ENCOUNTER — Other Ambulatory Visit: Payer: Self-pay

## 2017-02-25 VITALS — BP 140/78 | HR 68 | Ht 74.0 in | Wt 197.0 lb

## 2017-02-25 DIAGNOSIS — K5520 Angiodysplasia of colon without hemorrhage: Secondary | ICD-10-CM | POA: Diagnosis not present

## 2017-02-25 DIAGNOSIS — I6522 Occlusion and stenosis of left carotid artery: Secondary | ICD-10-CM

## 2017-02-25 DIAGNOSIS — D509 Iron deficiency anemia, unspecified: Secondary | ICD-10-CM

## 2017-02-25 DIAGNOSIS — D500 Iron deficiency anemia secondary to blood loss (chronic): Secondary | ICD-10-CM | POA: Diagnosis not present

## 2017-02-25 DIAGNOSIS — Z860100 Personal history of colon polyps, unspecified: Secondary | ICD-10-CM

## 2017-02-25 LAB — IBC PANEL
Iron: 79 ug/dL (ref 42–165)
Saturation Ratios: 19.1 % — ABNORMAL LOW (ref 20.0–50.0)
TRANSFERRIN: 295 mg/dL (ref 212.0–360.0)

## 2017-02-25 LAB — CBC WITH DIFFERENTIAL/PLATELET
BASOS ABS: 0.1 10*3/uL (ref 0.0–0.1)
BASOS PCT: 1 % (ref 0.0–3.0)
Eosinophils Absolute: 0.2 10*3/uL (ref 0.0–0.7)
Eosinophils Relative: 3.1 % (ref 0.0–5.0)
HEMATOCRIT: 33.1 % — AB (ref 39.0–52.0)
Hemoglobin: 11.2 g/dL — ABNORMAL LOW (ref 13.0–17.0)
LYMPHS ABS: 2.3 10*3/uL (ref 0.7–4.0)
LYMPHS PCT: 29.3 % (ref 12.0–46.0)
MCHC: 33.7 g/dL (ref 30.0–36.0)
MCV: 93.5 fl (ref 78.0–100.0)
MONOS PCT: 11.1 % (ref 3.0–12.0)
Monocytes Absolute: 0.9 10*3/uL (ref 0.1–1.0)
NEUTROS ABS: 4.4 10*3/uL (ref 1.4–7.7)
NEUTROS PCT: 55.5 % (ref 43.0–77.0)
PLATELETS: 226 10*3/uL (ref 150.0–400.0)
RBC: 3.54 Mil/uL — ABNORMAL LOW (ref 4.22–5.81)
RDW: 15.7 % — ABNORMAL HIGH (ref 11.5–15.5)
WBC: 7.9 10*3/uL (ref 4.0–10.5)

## 2017-02-25 LAB — FERRITIN: Ferritin: 24 ng/mL (ref 22.0–322.0)

## 2017-02-25 MED ORDER — FERROUS SULFATE 325 (65 FE) MG PO TABS: 325.0000 mg | ORAL_TABLET | Freq: Every day | ORAL | 3 refills | Status: DC

## 2017-02-25 NOTE — Progress Notes (Signed)
Subjective:    Patient ID: Ronald Colon, male    DOB: 1929/04/23, 83 y.o.   MRN: [id number]  HPI Ronald Colon is an 83 year old male with a history of colon polyps, colonic angioma ectasia, atrial fibrillation, aortic stenosis and iron deficiency anemia who is here for follow-up. He presents with his son. He was seen in the office to evaluate iron deficiency anemia on 11/05/2016. He was also found to have heme positive stool.  He came for upper endoscopy and colonoscopy which were performed in the outpatient hospital setting on 12/13/2016. His upper endoscopy was normal. Colonoscopy revealed multiple colonic polyps which were removed throughout the colon, an ascending colon angiodysplastic lesion treated with APC to ablation, diverticulosis and internal hemorrhoids. He also had iron replacement orally.  He reports that he has been feeling and doing well. He reports he had labs recently by primary care and was told his labs were "fine". He is off of IV iron and back on Eliquis. He's noticed no blood in his stool including no melena. No abdominal pain. Bowel habits are regular 1 stool per day. No diarrhea or constipation. Good appetite. He's gained about 4 pounds. No dysphagia or odynophagia. He's continue pantoprazole 40 mg daily. He does complain of a bitter taste in his mouth and overall poor taste dating back several months. He's also had issues falling asleep and is tried melatonin without much benefit.  He does use allopurinol, amiodarone, atorvastatin, Eliquis, Zetia, and metoprolol  Review of Systems As per history of present illness, otherwise negative  Current Medications, Allergies, Past Medical History, Past Surgical History, Family History and Social History were reviewed in [employer] record.     Objective:   Physical Exam BP 140/78   Pulse 68   Ht 6\' 2"  (1.88 m)   Wt 197 lb (89.4 kg)   BMI 25.29 kg/m  Constitutional: Well-developed and well-nourished.  No distress. HEENT: Normocephalic and atraumatic. Oropharynx is clear and moist. Conjunctivae are normal.  No scleral icterus. Neck: Neck supple. Trachea midline. Cardiovascular: Normal rate, regular rhythm and intact distal pulses. No 2/6 sem Pulmonary/chest: Effort normal and breath sounds normal. No wheezing, rales or rhonchi. Abdominal: Soft, nontender, nondistended. Bowel sounds active throughout. There are no masses palpable. No hepatosplenomegaly. Extremities: no clubbing, cyanosis, or edema Neurological: Alert and oriented to person place and time. Skin: Skin is warm and dry. Psychiatric: Normal mood and affect. Behavior is normal.      Assessment & Plan:  83 year old male with a history of colon polyps, colonic angioma ectasia, atrial fibrillation, aortic stenosis and iron deficiency anemia who is here for follow-up.  1. IDA -- most likely related to angiodysplastic lesion in the colon. This was ablated with APC. He is now off oral iron. Fortunately there is no evidence of cancer found on EGD or colonoscopy. Repeat CBC and iron studies today. Would consider video capsule endoscopy if he remains profoundly iron deficient or anemic.  2. History of colon polyps -- multiple adenomatous polyps. Given age of 55 years we will defer further screening/surveillance. He is in agreement with this recommendation  3. Bitter/altered taste -- likely medication side effect. This is been present since he was admitted to the hospital earlier this year. Amiodarone was started around this time and I wonder if this is the culprit medication.  Follow up on an as-needed basis 15 minutes spent with the patient today. Greater than 50% was spent in counseling and coordination of care with the  patient

## 2017-02-25 NOTE — Patient Instructions (Signed)
Please follow up with Dr Hilarie Fredrickson as needed.  If you are age 81 or older, your body mass index should be between 23-30. Your Body mass index is 25.29 kg/m. If this is out of the aforementioned range listed, please consider follow up with your Primary Care Provider.  If you are age 85 or younger, your body mass index should be between 19-25. Your Body mass index is 25.29 kg/m. If this is out of the aformentioned range listed, please consider follow up with your Primary Care Provider.

## 2017-03-04 ENCOUNTER — Telehealth: Payer: Self-pay | Admitting: Cardiology

## 2017-03-04 NOTE — Telephone Encounter (Signed)
Pt has questions about medications

## 2017-03-05 NOTE — Telephone Encounter (Signed)
Patient notified of Dr McDowell's advice.

## 2017-03-05 NOTE — Telephone Encounter (Signed)
No specific cardiac contraindication to him have having an epidural pain injection. Typically we receive some communication from the provider performing this injection however. In general Eliquis would be held at least 48 hours prior to this procedure.

## 2017-03-05 NOTE — Telephone Encounter (Signed)
Patient wants to have epidural injections for pain.He wants to know :1.Can he have procedure, and 2. When should he hold eliquis ?

## 2017-03-28 ENCOUNTER — Telehealth: Payer: Self-pay | Admitting: Cardiology

## 2017-03-28 NOTE — Telephone Encounter (Signed)
Pt accidentally took his medication this morning and again this afternoon, would like to speak to speak w/ someone. His son Christia Reading called [phone]

## 2017-03-29 NOTE — Telephone Encounter (Signed)
Spoke with son and he said that patient took morning medications twice yesterday including (allopurinol, eliquis,pacerone, zetia, fish oil, lopressor) . Patient said that he felt fine today and is currently visiting his wife who is inpatient at [facility] ICU and said that he informed the nurses on the floor yesterday that he doubled up on his morning medications in case something happened to him. No c/o chest pain, dizziness or sob. Per son, patient did take his morning medications this morning. Nurse advised son that if he could have his BP and HR checked that would be helpful and that if he develops dizziness, chest pain or sob that he needed to go to the ED for an evaluation. Son verbalized understanding of plan.

## 2017-05-01 DIAGNOSIS — M7981 Nontraumatic hematoma of soft tissue: Secondary | ICD-10-CM | POA: Diagnosis not present

## 2017-05-01 DIAGNOSIS — E119 Type 2 diabetes mellitus without complications: Secondary | ICD-10-CM | POA: Diagnosis not present

## 2017-05-01 DIAGNOSIS — Z6824 Body mass index (BMI) 24.0-24.9, adult: Secondary | ICD-10-CM | POA: Diagnosis not present

## 2017-05-01 DIAGNOSIS — M79641 Pain in right hand: Secondary | ICD-10-CM | POA: Diagnosis not present

## 2017-05-01 DIAGNOSIS — Z23 Encounter for immunization: Secondary | ICD-10-CM | POA: Diagnosis not present

## 2017-05-01 DIAGNOSIS — S61411A Laceration without foreign body of right hand, initial encounter: Secondary | ICD-10-CM | POA: Diagnosis not present

## 2017-05-03 ENCOUNTER — Ambulatory Visit (INDEPENDENT_AMBULATORY_CARE_PROVIDER_SITE_OTHER): Payer: Medicare Other | Admitting: Vascular Surgery

## 2017-05-03 ENCOUNTER — Ambulatory Visit (HOSPITAL_COMMUNITY)
Admission: RE | Admit: 2017-05-03 | Discharge: 2017-05-03 | Disposition: A | Discharge status: Home / Self Care | Payer: Medicare Other | Source: Ambulatory Visit | Attending: Vascular Surgery | Admitting: Vascular Surgery

## 2017-05-03 ENCOUNTER — Encounter: Payer: Self-pay | Admitting: Vascular Surgery

## 2017-05-03 VITALS — BP 145/67 | HR 50 | Temp 97.2°F | Resp 16 | Ht 74.0 in | Wt 189.0 lb

## 2017-05-03 DIAGNOSIS — I6522 Occlusion and stenosis of left carotid artery: Secondary | ICD-10-CM

## 2017-05-03 DIAGNOSIS — I6521 Occlusion and stenosis of right carotid artery: Secondary | ICD-10-CM | POA: Diagnosis not present

## 2017-05-03 DIAGNOSIS — Z8679 Personal history of other diseases of the circulatory system: Secondary | ICD-10-CM | POA: Insufficient documentation

## 2017-05-03 NOTE — Progress Notes (Signed)
Patient ID: Ronald Colon, male   DOB: May 05, 1929, 83 y.o.   MRN: [id number]  Reason for Consult: Carotid (6 mo f/u Carotid Bil)   Referred by [name], MD  Subjective:     HPI:  Ronald Colon is a 83 y.o. 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 Postoperatively he did very well was readmitted with some nonspecific neurologic complaints but has since done fine. He is back to his usual state of health. He does take our request. He does not take aspirin at this time or Plavix. He is not smoking and is without further TIA amaurosis or stroke.  Past Medical History:  Diagnosis Date  . Aortic stenosis    Mild February 2018  . Arthritis   . Atrial fibrillation [facility])    Diagnosed January 2018  . Carotid stenosis   . Diverticulosis   . Essential hypertension, benign   . Gout   . Hx of adenomatous colonic polyps   . Hyperlipidemia   . IDA (iron deficiency anemia)   . Internal hemorrhoids   . Rheumatic fever 1937  . Stroke [facility]) 09/10/2016   Family History  Problem Relation Age of Onset  . CVA Mother 61  . Pneumonia Father 7  . Colon cancer Neg Hx    Past Surgical History:  Procedure Laterality Date  . CATARACT EXTRACTION W/PHACO Right 08/02/2014   Procedure: CATARACT EXTRACTION PHACO AND INTRAOCULAR LENS PLACEMENT; CDE:  12.77;  Surgeon: Williams Che, MD;  [facility];  Service: Ophthalmology;  Laterality: Right;  . CATARACT EXTRACTION W/PHACO Left 02/15/2015   Procedure: CATARACT EXTRACTION PHACO AND INTRAOCULAR LENS PLACEMENT (IOC);  Surgeon: Williams Che, MD;  Location: [facility];  Service: Ophthalmology;  Laterality: Left;  CDE 18.00  . COLONOSCOPY    . COLONOSCOPY WITH PROPOFOL N/A 12/13/2016   Procedure: COLONOSCOPY WITH PROPOFOL;  Surgeon: Jerene Bears, MD;  [facility];  Service: Gastroenterology;  Laterality: N/A;  . ELBOW SURGERY Right    "grissle in there"  .  ENDARTERECTOMY Left 09/17/2016   Procedure: LEFT CAROTID ENDARTECTOMY;  Surgeon: Waynetta Sandy, MD;  Location: [place];  Service: Vascular;  Laterality: Left;  . ESOPHAGEAL DILATION  09/03/2013   Procedure: ESOPHAGEAL DILATION;  Surgeon: Rogene Houston, MD;  [facility];  Service: Endoscopy;;  . ESOPHAGOGASTRODUODENOSCOPY N/A 09/03/2013   Procedure: ESOPHAGOGASTRODUODENOSCOPY (EGD);  Surgeon: Rogene Houston, MD;  [facility];  Service: Endoscopy;  Laterality: N/A;  255-rescheduled to 1030 Ann to notify pt  . ESOPHAGOGASTRODUODENOSCOPY (EGD) WITH PROPOFOL N/A 12/13/2016   Procedure: ESOPHAGOGASTRODUODENOSCOPY (EGD) WITH PROPOFOL;  Surgeon: Jerene Bears, MD;  [facility];  Service: Gastroenterology;  Laterality: N/A;  . PATCH ANGIOPLASTY Left 09/17/2016   Procedure: [place] 1 CM X6 CM;  Surgeon: Waynetta Sandy, MD;  Location: [place];  Service: Vascular;  Laterality: Left;    Short Social History:  Social History  Substance Use Topics  . Smoking status: Former Smoker    Packs/day: 1.00    Years: 20.00    Types: Cigarettes    Quit date: 17  . Smokeless tobacco: Never Used  . Alcohol use No    No Known Allergies  Current Outpatient Prescriptions  Medication Sig Dispense Refill  . acetaminophen (TYLENOL) 325 MG tablet Take 650 mg by mouth daily as needed for mild pain or headache.     Marland Kitchen  allopurinol (ZYLOPRIM) 300 MG tablet Take 300 mg by mouth daily.    Marland Kitchen amiodarone (PACERONE) 200 MG tablet TAKE 1 TABLET BY MOUTH EVERY DAY 30 tablet 3  . apixaban (ELIQUIS) 5 MG TABS tablet Take 1 tablet (5 mg total) by mouth 2 (two) times daily. 60 tablet 0  . atorvastatin (LIPITOR) 40 MG tablet Take 1 tablet (40 mg total) by mouth daily at 6 PM. 30 tablet 11  . cephALEXin (KEFLEX) 500 MG capsule     . colchicine 0.6 MG tablet Take 0.6 mg by mouth 2 (two) times daily as needed (gout).     Marland Kitchen ezetimibe (ZETIA) 10 MG  tablet Take 10 mg by mouth daily.    . ferrous sulfate 325 (65 FE) MG tablet Take 1 tablet (325 mg total) by mouth daily with breakfast. 30 tablet 3  . fish oil-omega-3 fatty acids 1000 MG capsule Take 1 g by mouth daily.    . pantoprazole (PROTONIX) 20 MG tablet Take 1 tablet (20 mg total) by mouth daily. 90 tablet 3  . metoprolol tartrate (LOPRESSOR) 25 MG tablet Take 0.5 tablets (12.5 mg total) by mouth 2 (two) times daily. 90 tablet 3   No current facility-administered medications for this visit.     Review of Systems  Constitutional:  Constitutional negative. HENT: HENT negative.  Eyes: Eyes negative.  Respiratory: Respiratory negative.  Cardiovascular: Cardiovascular negative.  GI: Gastrointestinal negative.  Musculoskeletal: Musculoskeletal negative.  Skin: Skin negative.  Neurological: Neurological negative. Hematologic: Hematologic/lymphatic negative.        Objective:  Objective   Vitals:   05/03/17 1035 05/03/17 1037  BP: (!) 159/67 (!) 145/67  Pulse: (!) 50   Resp: 16   Temp: (!) 97.2 F (36.2 C)   TempSrc: Oral   SpO2: 99%   Weight: 189 lb (85.7 kg)   Height: 6\' 2"  (1.88 m)    Body mass index is 24.27 kg/m.  Physical Exam  Constitutional: He is oriented to person, place, and time. He appears well-developed.  HENT:  Head: Normocephalic.  Neck: Normal range of motion. Neck supple.  Well healed incisions  Cardiovascular:  Pulses:      Radial pulses are 2+ on the right side, and 2+ on the left side.       Femoral pulses are 2+ on the right side, and 2+ on the left side. Pulmonary/Chest: Effort normal.  Abdominal: Soft. He exhibits no mass.  Musculoskeletal: Normal range of motion. He exhibits no edema.  Neurological: He is alert and oriented to person, place, and time.  Skin: Skin is warm and dry.  Psychiatric: He has a normal mood and affect. His behavior is normal. Judgment and thought content normal.    Data: I vitamin Rise Paganini interpreted his  carotid artery duplex demonstrated 1-39% stenosis on the right with a patent left carotid endarterectomy although elevated velocities in the external carotid artery.     Assessment/Plan:     83yo male follows up for 6 month evaluation of left carotid endarterectomy from which she has done very well. This was performed first symptomatic disease he has had no further symptoms. He does take no antiplatelets and I have asked him to start baby aspirin which she can hold should he have any bleeding issues. He will follow-up in another 6 months with repeat duplex at which time if it is all normal he can move out to 1 year follow-up. He demonstrates good understanding we'll see him in 6 months.  Pratt Bress Christopher Alyda Megna MD [facility]  

## 2017-05-06 NOTE — Progress Notes (Signed)
Cardiology Office Note  Date: 05/07/2017   ID: Ronald Colon August 12, 1929, MRN [id number]  PCP: Ronald Sites, MD  Primary Cardiologist: Ronald Lesches, MD   Chief Complaint  Patient presents with  . Atrial Fibrillation    History of Present Illness: Ronald Colon is an 83 y.o. male last seen in May. He presents today with his son for a follow-up visit. Since last encounter he reports no palpitations or chest pain. He has not noticed any changes in his stools or obvious bleeding. Reports NYHA class II dyspnea, no dizziness or syncope.  I reviewed his medications which are outlined below. He has continued on amiodarone, Eliquis, and Lopressor.We discussed reducing amiodarone dose to 100 mg daily.  I personally reviewed his ECG today which shows sinus bradycardia with prolonged PR interval and IVCD.  Recent lab work is outlined below, hemoglobin was 11.2. He has not had recent TSH are LFTs.  Past Medical History:  Diagnosis Date  . Aortic stenosis    Mild February 2018  . Arthritis   . Atrial fibrillation [facility])    Diagnosed January 2018  . Carotid stenosis   . Diverticulosis   . Essential hypertension, benign   . Gout   . Hx of adenomatous colonic polyps   . Hyperlipidemia   . IDA (iron deficiency anemia)   . Internal hemorrhoids   . Rheumatic fever 1937  . Stroke [facility]) 09/10/2016    Past Surgical History:  Procedure Laterality Date  . CATARACT EXTRACTION W/PHACO Right 08/02/2014   Procedure: CATARACT EXTRACTION PHACO AND INTRAOCULAR LENS PLACEMENT; CDE:  12.77;  Surgeon: Ronald Che, MD;  [facility];  Service: Ophthalmology;  Laterality: Right;  . CATARACT EXTRACTION W/PHACO Left 02/15/2015   Procedure: CATARACT EXTRACTION PHACO AND INTRAOCULAR LENS PLACEMENT (IOC);  Surgeon: Ronald Che, MD;  [facility];  Service: Ophthalmology;  Laterality: Left;  CDE 18.00  . COLONOSCOPY    . COLONOSCOPY WITH PROPOFOL N/A 12/13/2016   Procedure: COLONOSCOPY  WITH PROPOFOL;  Surgeon: Ronald Bears, MD;  [facility];  Service: Gastroenterology;  Laterality: N/A;  . ELBOW SURGERY Right    "grissle in there"  . ENDARTERECTOMY Left 09/17/2016   Procedure: LEFT CAROTID ENDARTECTOMY;  Surgeon: Ronald Sandy, MD;  Location: [place];  Service: Vascular;  Laterality: Left;  . ESOPHAGEAL DILATION  09/03/2013   Procedure: ESOPHAGEAL DILATION;  Surgeon: Ronald Houston, MD;  [facility];  Service: Endoscopy;;  . ESOPHAGOGASTRODUODENOSCOPY N/A 09/03/2013   Procedure: ESOPHAGOGASTRODUODENOSCOPY (EGD);  Surgeon: Ronald Houston, MD;  [facility];  Service: Endoscopy;  Laterality: N/A;  255-rescheduled to 1030 Ann to notify pt  . ESOPHAGOGASTRODUODENOSCOPY (EGD) WITH PROPOFOL N/A 12/13/2016   Procedure: ESOPHAGOGASTRODUODENOSCOPY (EGD) WITH PROPOFOL;  Surgeon: Ronald Bears, MD;  [facility];  Service: Gastroenterology;  Laterality: N/A;  . PATCH ANGIOPLASTY Left 09/17/2016   Procedure: [place] 1 CM X6 CM;  Surgeon: Ronald Sandy, MD;  Location: [facility] OR;  Service: Vascular;  Laterality: Left;    Current Outpatient Prescriptions  Medication Sig Dispense Refill  . acetaminophen (TYLENOL) 325 MG tablet Take 650 mg by mouth daily as needed for mild pain or headache.     . allopurinol (ZYLOPRIM) 300 MG tablet Take 300 mg by mouth daily.    Marland Kitchen amiodarone (PACERONE) 200 MG tablet TAKE 1 TABLET BY MOUTH EVERY DAY 30 tablet 3  . apixaban (ELIQUIS) 5 MG  TABS tablet Take 1 tablet (5 mg total) by mouth 2 (two) times daily. 60 tablet 0  . atorvastatin (LIPITOR) 40 MG tablet Take 1 tablet (40 mg total) by mouth daily at 6 PM. 30 tablet 11  . cephALEXin (KEFLEX) 500 MG capsule     . colchicine 0.6 MG tablet Take 0.6 mg by mouth 2 (two) times daily as needed (gout).     Marland Kitchen ezetimibe (ZETIA) 10 MG tablet Take 10 mg by mouth daily.    . fish oil-omega-3 fatty acids 1000 MG capsule Take 1  g by mouth daily.    . pantoprazole (PROTONIX) 20 MG tablet Take 1 tablet (20 mg total) by mouth daily. 90 tablet 3  . metoprolol tartrate (LOPRESSOR) 25 MG tablet Take 0.5 tablets (12.5 mg total) by mouth 2 (two) times daily. 90 tablet 3   No current facility-administered medications for this visit.    Allergies:  Patient has no known allergies.   Social History: The patient  reports that he quit smoking about 38 years ago. His smoking use included Cigarettes. He has a 20.00 pack-year smoking history. He has never used smokeless tobacco. He reports that he does not drink alcohol or use drugs.   ROS:  Please see the history of present illness. Otherwise, complete review of systems is positive for hearing loss, arthritic stiffness.  All other systems are reviewed and negative.   Physical Exam: VS:  BP 118/60   Pulse (!) 52   Ht 6\' 2"  (1.88 m)   Wt 191 lb (86.6 kg)   SpO2 98%   BMI 24.52 kg/m , BMI Body mass index is 24.52 kg/m.  Wt Readings from Last 3 Encounters:  05/07/17 191 lb (86.6 kg)  05/03/17 189 lb (85.7 kg)  02/25/17 197 lb (89.4 kg)    General: Elderly male, appears comfortable at rest. HEENT: Conjunctiva and lids normal, oropharynx clear. Neck: Supple, no elevated JVP or carotid bruits, no thyromegaly. Lungs: Clear to auscultation, nonlabored breathing at rest. Cardiac: Regular rate and rhythm, no S3, 2/6 systolic murmur, no pericardial rub. Abdomen: Soft, nontender, bowel sounds present, no guarding or rebound. Extremities: Trace ankle edema, distal pulses 2+. Skin: Warm and dry. Musculoskeletal: No kyphosis. Neuropsychiatric: Alert and oriented x3, affect grossly appropriate.  ECG: I personally reviewed the tracing from 10/02/2016 which showed probable atypical atrial flutter with variable conduction and left anterior fascicular block/IVCD.  Recent Labwork: 09/10/2016: TSH 2.827 10/02/2016: ALT 22; AST 21; BUN 18; Creatinine, Ser 1.40; Potassium 4.1; Sodium  138 02/25/2017: Hemoglobin 11.2; Platelets 226.0     Component Value Date/Time   CHOL 178 09/11/2016 0252   TRIG 139 09/11/2016 0252   HDL 37 (L) 09/11/2016 0252   CHOLHDL 4.8 09/11/2016 0252   VLDL 28 09/11/2016 0252   LDLCALC 113 (H) 09/11/2016 0252    Other Studies Reviewed Today:  Echocardiogram 09/13/2016: Study Conclusions  - Left ventricle: The cavity size was normal. Wall thickness was   increased in a pattern of mild LVH. There was mild focal basal   hypertrophy of the septum. Systolic function was normal. The   estimated ejection fraction was in the range of 50% to 55%. Wall   motion was normal; there were no regional wall motion   abnormalities. - Aortic valve: Valve mobility was restricted. There was mild   stenosis. There was trivial regurgitation. - Mitral valve: Calcified annulus.  Impressions:  - Normal LV systolic function; calcified aortic valve with mild AS   (mean gradient  11 mmHg); trace AI.  Carotid Dopplers 05/03/2017: RICA stenosis at 1-39% with patent left CEA site.   Assessment and Plan:  1. Paroxysmal atrial fibrillation/flutter with CHADSVASC score of 5. He is in sinus rhythm today. Plan is to reduce amiodarone to 100 mg daily and otherwise continue with present cardiac regimen. Follow-up TSH and LFTs will be obtained.  2. History of stroke and severe left carotid artery stenosis status post CEA in February. He continues on statin therapy. Not on aspirin at this time in light of concurrent use of Eliquis and also history of GI bleeding.  3. Mild aortic stenosis by echocardiogram early this year, asymptomatic.  4. History of GI bleeding and anemia, status post colonoscopy earlier this year with resection of polyps and ablation of an angiodysplastic lesion, also internal hemorrhoids noted. Hemoglobin was relatively stable in July.  Current medicines were reviewed with the patient today.   Orders Placed This Encounter  Procedures  . EKG  12-Lead    Disposition: Follow-up in 4 months with CBC and BMET.  Signed, Satira Sark, MD, [facility] 05/07/2017 9:50 AM    [place] [facility] [place]. [address], [place], [place] [address] Phone: [phone]; Fax: [phone]

## 2017-05-07 ENCOUNTER — Ambulatory Visit (INDEPENDENT_AMBULATORY_CARE_PROVIDER_SITE_OTHER): Payer: Medicare Other | Admitting: Cardiology

## 2017-05-07 ENCOUNTER — Encounter: Payer: Self-pay | Admitting: Cardiology

## 2017-05-07 VITALS — BP 118/60 | HR 52 | Ht 74.0 in | Wt 191.0 lb

## 2017-05-07 DIAGNOSIS — Z79899 Other long term (current) drug therapy: Secondary | ICD-10-CM | POA: Diagnosis not present

## 2017-05-07 DIAGNOSIS — I350 Nonrheumatic aortic (valve) stenosis: Secondary | ICD-10-CM

## 2017-05-07 DIAGNOSIS — I6522 Occlusion and stenosis of left carotid artery: Secondary | ICD-10-CM

## 2017-05-07 DIAGNOSIS — Z8673 Personal history of transient ischemic attack (TIA), and cerebral infarction without residual deficits: Secondary | ICD-10-CM | POA: Diagnosis not present

## 2017-05-07 DIAGNOSIS — I480 Paroxysmal atrial fibrillation: Secondary | ICD-10-CM | POA: Diagnosis not present

## 2017-05-07 DIAGNOSIS — E782 Mixed hyperlipidemia: Secondary | ICD-10-CM

## 2017-05-07 DIAGNOSIS — Z8719 Personal history of other diseases of the digestive system: Secondary | ICD-10-CM | POA: Diagnosis not present

## 2017-05-07 DIAGNOSIS — I10 Essential (primary) hypertension: Secondary | ICD-10-CM

## 2017-05-07 MED ORDER — AMIODARONE HCL 200 MG PO TABS: 100.0000 mg | ORAL_TABLET | Freq: Every day | ORAL | 3 refills | Status: AC

## 2017-05-07 NOTE — Patient Instructions (Signed)
Medication Instructions:  Your physician has recommended you make the following change in your medication:  Decrease Amiodarone to 100 mg Daily    Labwork: Your physician recommends that you return for lab work Today.  Your physician recommends that you return for lab work in: 4 month (CBC, and BMET)     Testing/Procedures: NONE  Follow-Up: Your physician recommends that you schedule a follow-up appointment in: 4 months with labwork.    Any Other Special Instructions Will Be Listed Below (If Applicable).     If you need a refill on your cardiac medications before your next appointment, please call your pharmacy.  Thank you for choosing [facility]!

## 2017-05-08 ENCOUNTER — Other Ambulatory Visit (HOSPITAL_COMMUNITY)
Admission: RE | Admit: 2017-05-08 | Discharge: 2017-05-08 | Disposition: A | Discharge status: Home / Self Care | Payer: Medicare Other | Source: Ambulatory Visit | Attending: Cardiology | Admitting: Cardiology

## 2017-05-08 DIAGNOSIS — E784 Other hyperlipidemia: Secondary | ICD-10-CM | POA: Insufficient documentation

## 2017-05-08 DIAGNOSIS — Z79899 Other long term (current) drug therapy: Secondary | ICD-10-CM | POA: Diagnosis not present

## 2017-05-08 DIAGNOSIS — I481 Persistent atrial fibrillation: Secondary | ICD-10-CM | POA: Insufficient documentation

## 2017-05-08 LAB — HEPATIC FUNCTION PANEL
ALT: 33 U/L (ref 17–63)
AST: 33 U/L (ref 15–41)
Albumin: 3.6 g/dL (ref 3.5–5.0)
Alkaline Phosphatase: 50 U/L (ref 38–126)
Bilirubin, Direct: 0.1 mg/dL (ref 0.1–0.5)
Indirect Bilirubin: 0.3 mg/dL (ref 0.3–0.9)
Total Bilirubin: 0.4 mg/dL (ref 0.3–1.2)
Total Protein: 6.5 g/dL (ref 6.5–8.1)

## 2017-05-08 LAB — TSH: TSH: 2.005 u[IU]/mL (ref 0.350–4.500)

## 2017-05-09 NOTE — Addendum Note (Signed)
Addended by: Lianne Cure A on: 05/09/2017 04:03 PM   Modules accepted: Orders

## 2017-05-28 DIAGNOSIS — Z23 Encounter for immunization: Secondary | ICD-10-CM | POA: Diagnosis not present

## 2017-06-12 ENCOUNTER — Telehealth: Payer: Self-pay | Admitting: Cardiology

## 2017-06-12 NOTE — Telephone Encounter (Signed)
Patient wants to know if it is okay for him to take a steroid shot for pain. Patient states that he is on Eliquis. PCP to administer. / tg

## 2017-06-12 NOTE — Telephone Encounter (Signed)
Returned pt call. No answer. Left message for pt to return call.  

## 2017-06-13 NOTE — Telephone Encounter (Signed)
[id number] 

## 2017-06-13 NOTE — Telephone Encounter (Signed)
Returning call back. 

## 2017-06-14 NOTE — Telephone Encounter (Signed)
Satira Sark, MD  Bernita Raisin, RN        Generally this is a decision made by the individual performing the injection. I would have him check with that provider. If they need to have Eliquis held, would recommend holding it 24-48 hours.     Patient notified of Dr.McDowells message

## 2017-06-20 DIAGNOSIS — E663 Overweight: Secondary | ICD-10-CM | POA: Diagnosis not present

## 2017-06-20 DIAGNOSIS — M545 Low back pain: Secondary | ICD-10-CM | POA: Diagnosis not present

## 2017-06-20 DIAGNOSIS — Z6825 Body mass index (BMI) 25.0-25.9, adult: Secondary | ICD-10-CM | POA: Diagnosis not present

## 2017-08-16 ENCOUNTER — Encounter (HOSPITAL_COMMUNITY): Payer: Self-pay | Admitting: Emergency Medicine

## 2017-08-16 ENCOUNTER — Emergency Department (HOSPITAL_COMMUNITY): Payer: Medicare Other

## 2017-08-16 ENCOUNTER — Inpatient Hospital Stay (HOSPITAL_COMMUNITY)
Admission: EM | Admit: 2017-08-16 | Discharge: 2017-08-17 | DRG: 378 | Disposition: A | Discharge status: Home / Self Care | Payer: Medicare Other | Attending: Family Medicine | Admitting: Family Medicine

## 2017-08-16 ENCOUNTER — Other Ambulatory Visit: Payer: Self-pay

## 2017-08-16 DIAGNOSIS — K219 Gastro-esophageal reflux disease without esophagitis: Secondary | ICD-10-CM | POA: Diagnosis present

## 2017-08-16 DIAGNOSIS — R5383 Other fatigue: Secondary | ICD-10-CM | POA: Diagnosis not present

## 2017-08-16 DIAGNOSIS — I4891 Unspecified atrial fibrillation: Secondary | ICD-10-CM | POA: Diagnosis present

## 2017-08-16 DIAGNOSIS — Z1389 Encounter for screening for other disorder: Secondary | ICD-10-CM | POA: Diagnosis not present

## 2017-08-16 DIAGNOSIS — E785 Hyperlipidemia, unspecified: Secondary | ICD-10-CM | POA: Diagnosis present

## 2017-08-16 DIAGNOSIS — Z87891 Personal history of nicotine dependence: Secondary | ICD-10-CM | POA: Diagnosis not present

## 2017-08-16 DIAGNOSIS — D62 Acute posthemorrhagic anemia: Secondary | ICD-10-CM | POA: Diagnosis present

## 2017-08-16 DIAGNOSIS — K922 Gastrointestinal hemorrhage, unspecified: Secondary | ICD-10-CM | POA: Diagnosis not present

## 2017-08-16 DIAGNOSIS — D509 Iron deficiency anemia, unspecified: Secondary | ICD-10-CM | POA: Diagnosis present

## 2017-08-16 DIAGNOSIS — M109 Gout, unspecified: Secondary | ICD-10-CM | POA: Diagnosis present

## 2017-08-16 DIAGNOSIS — R195 Other fecal abnormalities: Secondary | ICD-10-CM | POA: Diagnosis present

## 2017-08-16 DIAGNOSIS — I10 Essential (primary) hypertension: Secondary | ICD-10-CM | POA: Diagnosis not present

## 2017-08-16 DIAGNOSIS — Z8673 Personal history of transient ischemic attack (TIA), and cerebral infarction without residual deficits: Secondary | ICD-10-CM

## 2017-08-16 DIAGNOSIS — R0609 Other forms of dyspnea: Secondary | ICD-10-CM | POA: Diagnosis not present

## 2017-08-16 DIAGNOSIS — N183 Chronic kidney disease, stage 3 unspecified: Secondary | ICD-10-CM | POA: Diagnosis present

## 2017-08-16 DIAGNOSIS — Q2733 Arteriovenous malformation of digestive system vessel: Secondary | ICD-10-CM | POA: Diagnosis not present

## 2017-08-16 DIAGNOSIS — Z7901 Long term (current) use of anticoagulants: Secondary | ICD-10-CM

## 2017-08-16 DIAGNOSIS — Z823 Family history of stroke: Secondary | ICD-10-CM | POA: Diagnosis not present

## 2017-08-16 DIAGNOSIS — I129 Hypertensive chronic kidney disease with stage 1 through stage 4 chronic kidney disease, or unspecified chronic kidney disease: Secondary | ICD-10-CM | POA: Diagnosis present

## 2017-08-16 DIAGNOSIS — I361 Nonrheumatic tricuspid (valve) insufficiency: Secondary | ICD-10-CM | POA: Diagnosis not present

## 2017-08-16 DIAGNOSIS — Z6826 Body mass index (BMI) 26.0-26.9, adult: Secondary | ICD-10-CM | POA: Diagnosis not present

## 2017-08-16 DIAGNOSIS — J069 Acute upper respiratory infection, unspecified: Secondary | ICD-10-CM | POA: Diagnosis not present

## 2017-08-16 DIAGNOSIS — R05 Cough: Secondary | ICD-10-CM | POA: Diagnosis not present

## 2017-08-16 DIAGNOSIS — R0602 Shortness of breath: Secondary | ICD-10-CM | POA: Diagnosis not present

## 2017-08-16 DIAGNOSIS — M19019 Primary osteoarthritis, unspecified shoulder: Secondary | ICD-10-CM | POA: Diagnosis not present

## 2017-08-16 DIAGNOSIS — D649 Anemia, unspecified: Secondary | ICD-10-CM

## 2017-08-16 DIAGNOSIS — K5520 Angiodysplasia of colon without hemorrhage: Secondary | ICD-10-CM | POA: Diagnosis present

## 2017-08-16 DIAGNOSIS — R531 Weakness: Secondary | ICD-10-CM | POA: Diagnosis not present

## 2017-08-16 LAB — COMPREHENSIVE METABOLIC PANEL
ALT: 23 U/L (ref 17–63)
AST: 32 U/L (ref 15–41)
Albumin: 3.6 g/dL (ref 3.5–5.0)
Alkaline Phosphatase: 63 U/L (ref 38–126)
Anion gap: 10 (ref 5–15)
BILIRUBIN TOTAL: 0.5 mg/dL (ref 0.3–1.2)
BUN: 34 mg/dL — AB (ref 6–20)
CO2: 19 mmol/L — ABNORMAL LOW (ref 22–32)
CREATININE: 1.63 mg/dL — AB (ref 0.61–1.24)
Calcium: 8.9 mg/dL (ref 8.9–10.3)
Chloride: 110 mmol/L (ref 101–111)
GFR, EST AFRICAN AMERICAN: 42 mL/min — AB (ref >60)
GFR, EST NON AFRICAN AMERICAN: 37 mL/min — AB (ref >60)
Glucose, Bld: 140 mg/dL — ABNORMAL HIGH (ref 65–99)
Potassium: 4.4 mmol/L (ref 3.5–5.1)
Sodium: 139 mmol/L (ref 135–145)
TOTAL PROTEIN: 6.7 g/dL (ref 6.5–8.1)

## 2017-08-16 LAB — CBC
HCT: 22.2 % — ABNORMAL LOW (ref 39.0–52.0)
Hemoglobin: 6.4 g/dL — CL (ref 13.0–17.0)
MCH: 22.9 pg — ABNORMAL LOW (ref 26.0–34.0)
MCHC: 28.8 g/dL — ABNORMAL LOW (ref 30.0–36.0)
MCV: 79.6 fL (ref 78.0–100.0)
PLATELETS: 287 10*3/uL (ref 150–400)
RBC: 2.79 MIL/uL — AB (ref 4.22–5.81)
RDW: 17.2 % — AB (ref 11.5–15.5)
WBC: 6.2 10*3/uL (ref 4.0–10.5)

## 2017-08-16 LAB — ABO/RH: ABO/RH(D): A NEG

## 2017-08-16 LAB — BRAIN NATRIURETIC PEPTIDE: B Natriuretic Peptide: 1266 pg/mL — ABNORMAL HIGH (ref 0.0–100.0)

## 2017-08-16 LAB — TROPONIN I: Troponin I: 0.1 ng/mL (ref <0.03)

## 2017-08-16 LAB — PREPARE RBC (CROSSMATCH)

## 2017-08-16 MED ORDER — ALLOPURINOL 300 MG PO TABS
300.0000 mg | ORAL_TABLET | Freq: Every day | ORAL | Status: DC
  Administered 2017-08-17: 300 mg via ORAL
  Filled 2017-08-16 (×4): qty 1

## 2017-08-16 MED ORDER — ACETAMINOPHEN 325 MG PO TABS: 650.0000 mg | ORAL_TABLET | Freq: Every day | ORAL | Status: DC | PRN

## 2017-08-16 MED ORDER — PANTOPRAZOLE SODIUM 40 MG IV SOLR
80.0000 mg | Freq: Once | INTRAVENOUS | Status: AC
  Administered 2017-08-16: 22:00:00 80 mg via INTRAVENOUS
  Filled 2017-08-16: qty 80

## 2017-08-16 MED ORDER — SODIUM CHLORIDE 0.9 % IV SOLN
10.0000 mL/h | Freq: Once | INTRAVENOUS | Status: AC
  Administered 2017-08-17: 10 mL/h via INTRAVENOUS

## 2017-08-16 MED ORDER — EZETIMIBE 10 MG PO TABS
10.0000 mg | ORAL_TABLET | Freq: Every day | ORAL | Status: DC
  Administered 2017-08-17: 10 mg via ORAL
  Filled 2017-08-16 (×4): qty 1

## 2017-08-16 MED ORDER — PANTOPRAZOLE SODIUM 20 MG PO TBEC
20.0000 mg | DELAYED_RELEASE_TABLET | Freq: Every day | ORAL | Status: DC
  Filled 2017-08-16 (×3): qty 1

## 2017-08-16 MED ORDER — ATORVASTATIN CALCIUM 40 MG PO TABS
40.0000 mg | ORAL_TABLET | Freq: Every day | ORAL | Status: DC
  Filled 2017-08-16 (×2): qty 1

## 2017-08-16 MED ORDER — ONDANSETRON HCL 4 MG PO TABS: 4.0000 mg | ORAL_TABLET | Freq: Four times a day (QID) | ORAL | Status: DC | PRN

## 2017-08-16 MED ORDER — COLCHICINE 0.6 MG PO TABS: 0.6000 mg | ORAL_TABLET | Freq: Two times a day (BID) | ORAL | Status: DC | PRN

## 2017-08-16 MED ORDER — ACETAMINOPHEN 650 MG RE SUPP: 650.0000 mg | Freq: Four times a day (QID) | RECTAL | Status: DC | PRN

## 2017-08-16 MED ORDER — SODIUM CHLORIDE 0.9 % IV SOLN
8.0000 mg/h | INTRAVENOUS | Status: DC
  Administered 2017-08-16: 8 mg/h via INTRAVENOUS
  Filled 2017-08-16 (×7): qty 80

## 2017-08-16 MED ORDER — AMIODARONE HCL 200 MG PO TABS
100.0000 mg | ORAL_TABLET | Freq: Every day | ORAL | Status: DC
  Administered 2017-08-17: 100 mg via ORAL
  Filled 2017-08-16: qty 1

## 2017-08-16 MED ORDER — PANTOPRAZOLE SODIUM 40 MG IV SOLR
INTRAVENOUS | Status: AC
  Filled 2017-08-16: qty 80

## 2017-08-16 MED ORDER — METOPROLOL TARTRATE 25 MG PO TABS
12.5000 mg | ORAL_TABLET | Freq: Two times a day (BID) | ORAL | Status: DC
  Administered 2017-08-17 (×2): 12.5 mg via ORAL
  Filled 2017-08-16 (×2): qty 1

## 2017-08-16 MED ORDER — ACETAMINOPHEN 325 MG PO TABS
650.0000 mg | ORAL_TABLET | Freq: Four times a day (QID) | ORAL | Status: DC | PRN
  Administered 2017-08-17: 650 mg via ORAL
  Filled 2017-08-16: qty 2

## 2017-08-16 MED ORDER — ONDANSETRON HCL 4 MG/2ML IJ SOLN: 4.0000 mg | Freq: Four times a day (QID) | INTRAMUSCULAR | Status: DC | PRN

## 2017-08-16 NOTE — H&P (Signed)
History and Physical    Ronald Colon FTD:[id number] DOB: 12-18-1928 DOA: 08/16/2017  PCP: [name], MD   Patient coming from: Home  Chief Complaint: Weakness, DOE  HPI: Ronald Colon is a 84 y.o. male with medical history significant for atrial fibrillation on Eliquis, diverticulosis, iron deficiency anemia, hypertension, gout, dyslipidemia, and prior CVA who presents to the emergency department after his primary care physician called him on account of a hemoglobin of 6.3.  He went to see his physician earlier today due to upper respiratory symptoms and had some blood work drawn.  Patient denies any chest pain or significant respiratory distress but does have some dyspnea on exertion which has been worsening over several weeks.  He denies edema or weight gain.  He denies any fever or chills, but has had a minimal cough which has been nonproductive.   ED Course: Vital signs are noted to be stable and patient is otherwise in sinus rhythm.  Labs demonstrate hemoglobin of 6.4 and hematocrit 22.2.  BNP was over 1200 and troponin was 0.10.  He has been started on Protonix drip with 2 units of PRBCs ordered.  GI is being contacted.  He appears comfortable and is currently in room air.  Review of Systems: As per HPI otherwise 10 point review of systems negative.   Past Medical History:  Diagnosis Date  . Aortic stenosis    Mild February 2018  . Arthritis   . Atrial fibrillation [facility])    Diagnosed January 2018  . Carotid stenosis   . Diverticulosis   . Essential hypertension, benign   . Gout   . Hx of adenomatous colonic polyps   . Hyperlipidemia   . IDA (iron deficiency anemia)   . Internal hemorrhoids   . Rheumatic fever 1937  . Stroke [facility]) 09/10/2016    Past Surgical History:  Procedure Laterality Date  . CATARACT EXTRACTION W/PHACO Right 08/02/2014   Procedure: CATARACT EXTRACTION PHACO AND INTRAOCULAR LENS PLACEMENT; CDE:  12.77;  Surgeon: Williams Che, MD;  [facility];  Service: Ophthalmology;  Laterality: Right;  . CATARACT EXTRACTION W/PHACO Left 02/15/2015   Procedure: CATARACT EXTRACTION PHACO AND INTRAOCULAR LENS PLACEMENT (IOC);  Surgeon: Williams Che, MD;  Location: [facility] ORS;  Service: Ophthalmology;  Laterality: Left;  CDE 18.00  . COLONOSCOPY    . COLONOSCOPY WITH PROPOFOL N/A 12/13/2016   Procedure: COLONOSCOPY WITH PROPOFOL;  Surgeon: Jerene Bears, MD;  [facility];  Service: Gastroenterology;  Laterality: N/A;  . ELBOW SURGERY Right    "grissle in there"  . ENDARTERECTOMY Left 09/17/2016   Procedure: LEFT CAROTID ENDARTECTOMY;  Surgeon: Waynetta Sandy, MD;  Location: [place];  Service: Vascular;  Laterality: Left;  . ESOPHAGEAL DILATION  09/03/2013   Procedure: ESOPHAGEAL DILATION;  Surgeon: Rogene Houston, MD;  [facility];  Service: Endoscopy;;  . ESOPHAGOGASTRODUODENOSCOPY N/A 09/03/2013   Procedure: ESOPHAGOGASTRODUODENOSCOPY (EGD);  Surgeon: Rogene Houston, MD;  [facility];  Service: Endoscopy;  Laterality: N/A;  255-rescheduled to 1030 Ann to notify pt  . ESOPHAGOGASTRODUODENOSCOPY (EGD) WITH PROPOFOL N/A 12/13/2016   Procedure: ESOPHAGOGASTRODUODENOSCOPY (EGD) WITH PROPOFOL;  Surgeon: Jerene Bears, MD;  [facility];  Service: Gastroenterology;  Laterality: N/A;  . PATCH ANGIOPLASTY Left 09/17/2016   Procedure: [place] 1 CM X6 CM;  Surgeon: Waynetta Sandy, MD;  Location: [place];  Service: Vascular;  Laterality: Left;     reports that  he quit smoking about 39 years ago. His smoking use included cigarettes. He has a 20.00 pack-year smoking history. he has never used smokeless tobacco. He reports that he does not drink alcohol or use drugs.  No Known Allergies  Family History  Problem Relation Age of Onset  . CVA Mother 28  . Pneumonia Father 62  . Colon cancer Neg Hx     Prior to Admission medications   Medication Sig Start Date End  Date Taking? Authorizing Provider  acetaminophen (TYLENOL) 325 MG tablet Take 650 mg by mouth daily as needed for mild pain or headache.    Yes [provider]  allopurinol (ZYLOPRIM) 300 MG tablet Take 300 mg by mouth daily.   Yes [provider]  amiodarone (PACERONE) 200 MG tablet Take 0.5 tablets (100 mg total) by mouth daily. 05/07/17  Yes Satira Sark, MD  atorvastatin (LIPITOR) 40 MG tablet Take 1 tablet (40 mg total) by mouth daily at 6 PM. 09/19/16  Yes Trinh, Janalyn Harder, PA-C  colchicine 0.6 MG tablet Take 0.6 mg by mouth 2 (two) times daily as needed (gout).    Yes [provider]  ezetimibe (ZETIA) 10 MG tablet Take 10 mg by mouth daily.   Yes [provider]  fish oil-omega-3 fatty acids 1000 MG capsule Take 1 g by mouth daily.   Yes [provider]  metoprolol tartrate (LOPRESSOR) 25 MG tablet Take 0.5 tablets (12.5 mg total) by mouth 2 (two) times daily. 01/08/17 08/16/17 Yes Satira Sark, MD  pantoprazole (PROTONIX) 20 MG tablet Take 1 tablet (20 mg total) by mouth daily. 09/26/16  Yes Satira Sark, MD  apixaban (ELIQUIS) 5 MG TABS tablet Take 1 tablet (5 mg total) by mouth 2 (two) times daily. 09/19/16   Alvia Grove, PA-C  cephALEXin (KEFLEX) 500 MG capsule  05/01/17   [provider]    Physical Exam: Vitals:   08/16/17 2053 08/16/17 2054 08/16/17 2100 08/16/17 2115  BP:   (!) 146/60   Pulse: 80 79 73 72  Resp: 20 20 (!) 25 16  Temp:      TempSrc:      SpO2: 99% 100% 100% 99%  Weight:      Height:        Constitutional: NAD, calm, comfortable Vitals:   08/16/17 2053 08/16/17 2054 08/16/17 2100 08/16/17 2115  BP:   (!) 146/60   Pulse: 80 79 73 72  Resp: 20 20 (!) 25 16  Temp:      TempSrc:      SpO2: 99% 100% 100% 99%  Weight:      Height:       Eyes: lids and conjunctivae normal ENMT: Mucous membranes are moist.  Neck: normal, supple Respiratory: clear to auscultation bilaterally. Normal  respiratory effort. No accessory muscle use. On RA. Cardiovascular: Regular rate and rhythm, no murmurs. No extremity edema. Abdomen: no tenderness, no distention. Bowel sounds positive.  Musculoskeletal:  No joint deformity upper and lower extremities.   Skin: no rashes, lesions, ulcers.  Psychiatric: Normal judgment and insight. Alert and oriented x 3. Normal mood.   Labs on Admission: I have personally reviewed following labs and imaging studies  CBC: Recent Labs  Lab 08/16/17 1840  WBC 6.2  HGB 6.4*  HCT 22.2*  MCV 79.6  PLT 347   Basic Metabolic Panel: Recent Labs  Lab 08/16/17 1840  NA 139  K 4.4  CL 110  CO2 19*  GLUCOSE  140*  BUN 34*  CREATININE 1.63*  CALCIUM 8.9   GFR: Estimated Creatinine Clearance: 37.8 mL/min (A) (by C-G formula based on SCr of 1.63 mg/dL (H)). Liver Function Tests: Recent Labs  Lab 08/16/17 1840  AST 32  ALT 23  ALKPHOS 63  BILITOT 0.5  PROT 6.7  ALBUMIN 3.6   No results for input(s): LIPASE, AMYLASE in the last 168 hours. No results for input(s): AMMONIA in the last 168 hours. Coagulation Profile: No results for input(s): INR, PROTIME in the last 168 hours. Cardiac Enzymes: Recent Labs  Lab 08/16/17 1909  TROPONINI 0.10*   BNP (last 3 results) No results for input(s): PROBNP in the last 8760 hours. HbA1C: No results for input(s): HGBA1C in the last 72 hours. CBG: No results for input(s): GLUCAP in the last 168 hours. Lipid Profile: No results for input(s): CHOL, HDL, LDLCALC, TRIG, CHOLHDL, LDLDIRECT in the last 72 hours. Thyroid Function Tests: No results for input(s): TSH, T4TOTAL, FREET4, T3FREE, THYROIDAB in the last 72 hours. Anemia Panel: No results for input(s): VITAMINB12, FOLATE, FERRITIN, TIBC, IRON, RETICCTPCT in the last 72 hours. Urine analysis:    Component Value Date/Time   COLORURINE YELLOW 10/02/2016 1048   APPEARANCEUR CLEAR 10/02/2016 1048   LABSPEC 1.012 10/02/2016 1048   PHURINE 5.0  10/02/2016 1048   GLUCOSEU NEGATIVE 10/02/2016 1048   HGBUR NEGATIVE 10/02/2016 1048   BILIRUBINUR NEGATIVE 10/02/2016 1048   KETONESUR NEGATIVE 10/02/2016 1048   PROTEINUR NEGATIVE 10/02/2016 1048   NITRITE NEGATIVE 10/02/2016 1048   LEUKOCYTESUR NEGATIVE 10/02/2016 1048    Radiological Exams on Admission: No results found.  EKG: Independently reviewed. SR with LBBB; no other acute findings noted.  Assessment/Plan Principal Problem:   Acute blood loss anemia Active Problems:   Essential hypertension, benign   GERD (gastroesophageal reflux disease)   CKD (chronic kidney disease), stage III (HCC)   AVM (arteriovenous malformation) of colon   Heme positive stool   Dyspnea on exertion   Anemia    Acute blood loss anemia likely secondary to GI bleed GI consultation for endoscopy 2 unit PRBC transfusion N.p.o. Protonix drip Repeat a.m. labs Hold Eliquis  Dyspnea on exertion with elevated BNP Chest x-ray pending 2D echocardiogram May need Lasix after blood transfusion  Atrial fibrillation-currently in sinus rhythm Continue to monitor on telemetry Continue amiodarone Hold Eliquis  Troponin elevation-not suggestive of ACS Repeat troponins and monitor on telemetry  CKD stage III Currently stable-monitor  Hypertension-controlled Continue home medications  DVT prophylaxis: SCDs Code Status: Full Family Communication: None Disposition Plan:Home Consults called:GI Admission status: Inpatient, tele   Pavneet Markwood Darleen Crocker DO [facility] Pager [phone]  If 7PM-7AM, please contact night-coverage [email or web address] Password [email or web address]  08/16/2017, 9:30 PM

## 2017-08-16 NOTE — ED Triage Notes (Signed)
Pt reports had blood work completed and was notified hemoglobin 6.9. Pt reports weakness, dyspnea with ambulation. Pt denies chest pain,abd pain. Pt reports intermittent black stools. Pt reports is on eliquis.

## 2017-08-16 NOTE — ED Provider Notes (Signed)
[facility] EMERGENCY DEPARTMENT Provider Note   CSN: [id number] Arrival date & time: 08/16/17  6644     History   Chief Complaint Chief Complaint  Patient presents with  . Abnormal Lab    HPI Ronald Colon is a 84 y.o. male.  HPI Patient saw his primary physician yesterday.  Has had several weeks of generalized weakness, dyspnea with exertion and minimal cough.  No chest pain or abdominal pain.  States he has had episodic black stools.  Denies any urinary complaints.  Was called back today due to hemoglobin level of 6.3 and advised to go to the emergency department.  Patient is on Eliquis for atrial fibrillation. Past Medical History:  Diagnosis Date  . Aortic stenosis    Mild February 2018  . Arthritis   . Atrial fibrillation [facility])    Diagnosed January 2018  . Carotid stenosis   . Diverticulosis   . Essential hypertension, benign   . Gout   . Hx of adenomatous colonic polyps   . Hyperlipidemia   . IDA (iron deficiency anemia)   . Internal hemorrhoids   . Rheumatic fever 1937  . Stroke [facility]) 09/10/2016    Patient Active Problem List   Diagnosis Date Noted  . Iron deficiency anemia   . AVM (arteriovenous malformation) of colon   . Heme positive stool   . Benign neoplasm of cecum   . Benign neoplasm of ascending colon   . Benign neoplasm of transverse colon   . Benign neoplasm of descending colon   . Right arm numbness 10/02/2016  . Cerebral infarction due to occlusion or stenosis of precerebral artery ([place])   . Hyperlipidemia   . New onset atrial fibrillation ([place])   . Anticoagulation adequate   . Elevated troponin   . Carotid stenosis 09/12/2016  . Cerebrovascular accident (CVA) due to stenosis of left middle cerebral artery ([place]) 09/10/2016  . Hyperglycemia 09/10/2016  . CKD (chronic kidney disease), stage III ([place]) 09/10/2016  . GERD (gastroesophageal reflux disease) 08/24/2013  . Heart murmur 08/18/2013  . Irregular heartbeat 08/18/2013  . Essential  hypertension, benign 08/18/2013    Past Surgical History:  Procedure Laterality Date  . CATARACT EXTRACTION W/PHACO Right 08/02/2014   Procedure: CATARACT EXTRACTION PHACO AND INTRAOCULAR LENS PLACEMENT; CDE:  12.77;  Surgeon: Williams Che, MD;  [facility];  Service: Ophthalmology;  Laterality: Right;  . CATARACT EXTRACTION W/PHACO Left 02/15/2015   Procedure: CATARACT EXTRACTION PHACO AND INTRAOCULAR LENS PLACEMENT (IOC);  Surgeon: Williams Che, MD;  Location[facility];  Service: Ophthalmology;  Laterality: Left;  CDE 18.00  . COLONOSCOPY    . COLONOSCOPY WITH PROPOFOL N/A 12/13/2016   Procedure: COLONOSCOPY WITH PROPOFOL;  Surgeon: Jerene Bears, MD;  [facility];  Service: Gastroenterology;  Laterality: N/A;  . ELBOW SURGERY Right    "grissle in there"  . ENDARTERECTOMY Left 09/17/2016   Procedure: LEFT CAROTID ENDARTECTOMY;  Surgeon: Waynetta Sandy, MD;  Location: [place];  Service: Vascular;  Laterality: Left;  . ESOPHAGEAL DILATION  09/03/2013   Procedure: ESOPHAGEAL DILATION;  Surgeon: Rogene Houston, MD;  [facility];  Service: Endoscopy;;  . ESOPHAGOGASTRODUODENOSCOPY N/A 09/03/2013   Procedure: ESOPHAGOGASTRODUODENOSCOPY (EGD);  Surgeon: Rogene Houston, MD;  [facility];  Service: Endoscopy;  Laterality: N/A;  255-rescheduled to 1030 Ann to notify pt  . ESOPHAGOGASTRODUODENOSCOPY (EGD) WITH PROPOFOL N/A 12/13/2016   Procedure: ESOPHAGOGASTRODUODENOSCOPY (EGD) WITH PROPOFOL;  Surgeon: Jerene Bears, MD;  [facility];  Service: Gastroenterology;  Laterality: N/A;  . PATCH ANGIOPLASTY Left 09/17/2016   Procedure: [place] 1 CM X6 CM;  Surgeon: Waynetta Sandy, MD;  Location: [facility] OR;  Service: Vascular;  Laterality: Left;       Home Medications    Prior to Admission medications   Medication Sig Start Date End Date Taking? Authorizing Provider  allopurinol (ZYLOPRIM) 300 MG  tablet Take 300 mg by mouth daily.   Yes [provider]  amiodarone (PACERONE) 200 MG tablet Take 0.5 tablets (100 mg total) by mouth daily. 05/07/17  Yes Satira Sark, MD  apixaban (ELIQUIS) 5 MG TABS tablet Take 1 tablet (5 mg total) by mouth 2 (two) times daily. 09/19/16  Yes Alvia Grove, PA-C  atorvastatin (LIPITOR) 40 MG tablet Take 1 tablet (40 mg total) by mouth daily at 6 PM. 09/19/16  Yes Trinh, Janalyn Harder, PA-C  ezetimibe (ZETIA) 10 MG tablet Take 10 mg by mouth daily.   Yes [provider]  metoprolol tartrate (LOPRESSOR) 25 MG tablet Take 0.5 tablets (12.5 mg total) by mouth 2 (two) times daily. 01/08/17 08/16/17 Yes Satira Sark, MD  pantoprazole (PROTONIX) 20 MG tablet Take 1 tablet (20 mg total) by mouth daily. 09/26/16  Yes Satira Sark, MD  acetaminophen (TYLENOL) 325 MG tablet Take 650 mg by mouth daily as needed for mild pain or headache.     [provider]  cephALEXin (KEFLEX) 500 MG capsule  05/01/17   [provider]  colchicine 0.6 MG tablet Take 0.6 mg by mouth 2 (two) times daily as needed (gout).     [provider]  fish oil-omega-3 fatty acids 1000 MG capsule Take 1 g by mouth daily.    [provider]    Family History Family History  Problem Relation Age of Onset  . CVA Mother 3  . Pneumonia Father 41  . Colon cancer Neg Hx     Social History Social History   Tobacco Use  . Smoking status: Former Smoker    Packs/day: 1.00    Years: 20.00    Pack years: 20.00    Types: Cigarettes    Last attempt to quit: 1980    Years since quitting: 39.0  . Smokeless tobacco: Never Used  Substance Use Topics  . Alcohol use: No  . Drug use: No     Allergies   Patient has no known allergies.   Review of Systems Review of Systems  Constitutional: Positive for fatigue. Negative for chills and fever.  Respiratory: Positive for cough and shortness of breath. Negative for chest tightness.     Cardiovascular: Negative for chest pain, palpitations and leg swelling.  Gastrointestinal: Negative for abdominal pain, blood in stool, constipation, diarrhea, nausea and vomiting.  Genitourinary: Negative for dysuria, frequency and hematuria.  Musculoskeletal: Negative for back pain, myalgias and neck pain.  Skin: Negative for rash and wound.  Neurological: Negative for dizziness, weakness, light-headedness, numbness and headaches.  All other systems reviewed and are negative.    Physical Exam Updated Vital Signs BP (!) 148/70 (BP Location: Right Arm)   Pulse 96   Temp 98.4 F (36.9 C) (Oral)   Resp (!) 21   Ht 6\' 2"  (1.88 m)   Wt 90.7 kg (200 lb)   SpO2 97%   BMI 25.68 kg/m   Physical Exam  Constitutional: He is oriented to person, place, and time. He appears well-developed and  well-nourished. No distress.  HENT:  Head: Normocephalic and atraumatic.  Mouth/Throat: Oropharynx is clear and moist.  Eyes: EOM are normal. Pupils are equal, round, and reactive to light.  Neck: Normal range of motion. Neck supple.  Cardiovascular: Normal rate and regular rhythm. Exam reveals no gallop and no friction rub.  No murmur heard. Pulmonary/Chest: Effort normal.  Few crackles in bilateral bases.  Abdominal: Soft. Bowel sounds are normal. There is no tenderness. There is no rebound and no guarding.  Genitourinary: Rectal exam shows guaiac positive stool.  Genitourinary Comments: Brown stool on rectal exam.  Guaiac positive  Musculoskeletal: Normal range of motion. He exhibits edema. He exhibits no tenderness.  1+ bilateral lower extremity pitting edema.  No calf asymmetry or tenderness.  Neurological: He is alert and oriented to person, place, and time.  Skin: Skin is warm and dry. Capillary refill takes less than 2 seconds. No rash noted. He is not diaphoretic. No erythema.  Psychiatric: He has a normal mood and affect. His behavior is normal.  Nursing note and vitals  reviewed.    ED Treatments / Results  Labs (all labs ordered are listed, but only abnormal results are displayed) Labs Reviewed  COMPREHENSIVE METABOLIC PANEL - Abnormal; Notable for the following components:      Result Value   CO2 19 (*)    Glucose, Bld 140 (*)    BUN 34 (*)    Creatinine, Ser 1.63 (*)    GFR calc non Af Amer 37 (*)    GFR calc Af Amer 42 (*)    All other components within normal limits  CBC - Abnormal; Notable for the following components:   RBC 2.79 (*)    Hemoglobin 6.4 (*)    HCT 22.2 (*)    MCH 22.9 (*)    MCHC 28.8 (*)    RDW 17.2 (*)    All other components within normal limits  BRAIN NATRIURETIC PEPTIDE - Abnormal; Notable for the following components:   B Natriuretic Peptide 1,266.0 (*)    All other components within normal limits  TROPONIN I - Abnormal; Notable for the following components:   Troponin I 0.10 (*)    All other components within normal limits  POC OCCULT BLOOD, ED  TYPE AND SCREEN  PREPARE RBC (CROSSMATCH)  ABO/RH    EKG  EKG Interpretation None       Radiology No results found.  Procedures Procedures (including critical care time)  Medications Ordered in ED Medications  0.9 %  sodium chloride infusion (not administered)  pantoprazole (PROTONIX) 80 mg in sodium chloride 0.9 % 100 mL IVPB (not administered)  pantoprazole (PROTONIX) 80 mg in sodium chloride 0.9 % 250 mL (0.32 mg/mL) infusion (not administered)   CRITICAL CARE Performed by: Julianne Rice Total critical care time: 40 minutes Critical care time was exclusive of separately billable procedures and treating other patients. Critical care was necessary to treat or prevent imminent or life-threatening deterioration. Critical care was time spent personally by me on the following activities: development of treatment plan with patient and/or surrogate as well as nursing, discussions with consultants, evaluation of patient's response to treatment, examination  of patient, obtaining history from patient or surrogate, ordering and performing treatments and interventions, ordering and review of laboratory studies, ordering and review of radiographic studies, pulse oximetry and re-evaluation of patient's condition.  Initial Impression / Assessment and Plan / ED Course  I have reviewed the triage vital signs and the nursing notes.  Pertinent  labs & imaging results that were available during my care of the patient were reviewed by me and considered in my medical decision making (see chart for details).     Patient with symptomatic anemia likely due to gastrointestinal bleeding.  Will initiate transfusion in the emergency department.  Will need admission and GI consult. Assessed with hospitalist who will see patient and admit. Final Clinical Impressions(s) / ED Diagnoses   Final diagnoses:  Symptomatic anemia  Gastrointestinal hemorrhage, unspecified gastrointestinal hemorrhage type    ED Discharge Orders    None       Julianne Rice, MD 08/16/17 2038

## 2017-08-16 NOTE — ED Notes (Signed)
ED Provider at bedside. 

## 2017-08-16 NOTE — ED Notes (Signed)
Patient transported to X-ray 

## 2017-08-17 ENCOUNTER — Encounter (HOSPITAL_COMMUNITY): Payer: Self-pay | Admitting: Gastroenterology

## 2017-08-17 ENCOUNTER — Encounter (HOSPITAL_COMMUNITY)
Admission: EM | Disposition: A | Discharge status: Home / Self Care | Payer: Self-pay | Source: Home / Self Care | Attending: Family Medicine

## 2017-08-17 ENCOUNTER — Inpatient Hospital Stay (HOSPITAL_COMMUNITY): Payer: Medicare Other

## 2017-08-17 DIAGNOSIS — N183 Chronic kidney disease, stage 3 (moderate): Secondary | ICD-10-CM

## 2017-08-17 DIAGNOSIS — K219 Gastro-esophageal reflux disease without esophagitis: Secondary | ICD-10-CM

## 2017-08-17 DIAGNOSIS — Q2733 Arteriovenous malformation of digestive system vessel: Secondary | ICD-10-CM

## 2017-08-17 DIAGNOSIS — I10 Essential (primary) hypertension: Secondary | ICD-10-CM

## 2017-08-17 DIAGNOSIS — D62 Acute posthemorrhagic anemia: Secondary | ICD-10-CM

## 2017-08-17 DIAGNOSIS — R195 Other fecal abnormalities: Secondary | ICD-10-CM

## 2017-08-17 DIAGNOSIS — I361 Nonrheumatic tricuspid (valve) insufficiency: Secondary | ICD-10-CM

## 2017-08-17 DIAGNOSIS — R0609 Other forms of dyspnea: Secondary | ICD-10-CM

## 2017-08-17 HISTORY — PX: GIVENS CAPSULE STUDY: SHX5432

## 2017-08-17 LAB — TYPE AND SCREEN
ABO/RH(D): A NEG
Antibody Screen: NEGATIVE
UNIT DIVISION: 0
UNIT DIVISION: 0

## 2017-08-17 LAB — COMPREHENSIVE METABOLIC PANEL
ALBUMIN: 3.1 g/dL — AB (ref 3.5–5.0)
ALT: 21 U/L (ref 17–63)
AST: 27 U/L (ref 15–41)
Alkaline Phosphatase: 52 U/L (ref 38–126)
Anion gap: 9 (ref 5–15)
BILIRUBIN TOTAL: 0.9 mg/dL (ref 0.3–1.2)
BUN: 31 mg/dL — ABNORMAL HIGH (ref 6–20)
CO2: 19 mmol/L — ABNORMAL LOW (ref 22–32)
Calcium: 8.4 mg/dL — ABNORMAL LOW (ref 8.9–10.3)
Chloride: 112 mmol/L — ABNORMAL HIGH (ref 101–111)
Creatinine, Ser: 1.39 mg/dL — ABNORMAL HIGH (ref 0.61–1.24)
GFR calc Af Amer: 51 mL/min — ABNORMAL LOW (ref >60)
GFR calc non Af Amer: 44 mL/min — ABNORMAL LOW (ref >60)
GLUCOSE: 106 mg/dL — AB (ref 65–99)
POTASSIUM: 4.1 mmol/L (ref 3.5–5.1)
Sodium: 140 mmol/L (ref 135–145)
TOTAL PROTEIN: 5.7 g/dL — AB (ref 6.5–8.1)

## 2017-08-17 LAB — BPAM RBC
BLOOD PRODUCT EXPIRATION DATE: 201901172359
BLOOD PRODUCT EXPIRATION DATE: 201901312359
ISSUE DATE / TIME: 201901042052
ISSUE DATE / TIME: 201901042328
UNIT TYPE AND RH: 600
UNIT TYPE AND RH: 9500

## 2017-08-17 LAB — CBC
HEMATOCRIT: 25.3 % — AB (ref 39.0–52.0)
Hemoglobin: 7.6 g/dL — ABNORMAL LOW (ref 13.0–17.0)
MCH: 24.3 pg — ABNORMAL LOW (ref 26.0–34.0)
MCHC: 30 g/dL (ref 30.0–36.0)
MCV: 80.8 fL (ref 78.0–100.0)
Platelets: 246 10*3/uL (ref 150–400)
RBC: 3.13 MIL/uL — ABNORMAL LOW (ref 4.22–5.81)
RDW: 17.3 % — ABNORMAL HIGH (ref 11.5–15.5)
WBC: 5.8 10*3/uL (ref 4.0–10.5)

## 2017-08-17 LAB — ECHOCARDIOGRAM COMPLETE
Height: 74 in
WEIGHTICAEL: 3093.49 [oz_av]

## 2017-08-17 LAB — PROTIME-INR
INR: 1.47
Prothrombin Time: 17.7 seconds — ABNORMAL HIGH (ref 11.4–15.2)

## 2017-08-17 LAB — TROPONIN I
TROPONIN I: 0.12 ng/mL — AB (ref <0.03)
Troponin I: 0.14 ng/mL (ref <0.03)

## 2017-08-17 SURGERY — IMAGING PROCEDURE, GI TRACT, INTRALUMINAL, VIA CAPSULE

## 2017-08-17 MED ORDER — PANTOPRAZOLE SODIUM 40 MG PO TBEC: 40.0000 mg | DELAYED_RELEASE_TABLET | Freq: Every day | ORAL | 0 refills | Status: DC

## 2017-08-17 MED ORDER — PANTOPRAZOLE SODIUM 40 MG PO TBEC: 40.0000 mg | DELAYED_RELEASE_TABLET | Freq: Every day | ORAL | Status: DC

## 2017-08-17 MED ORDER — PANTOPRAZOLE SODIUM 40 MG IV SOLR
INTRAVENOUS | Status: AC
  Filled 2017-08-17: qty 80

## 2017-08-17 MED ORDER — SODIUM CHLORIDE 0.9 % IV SOLN
125.0000 mg | Freq: Once | INTRAVENOUS | Status: AC
  Administered 2017-08-17: 125 mg via INTRAVENOUS
  Filled 2017-08-17: qty 10

## 2017-08-17 NOTE — Progress Notes (Signed)
*  PRELIMINARY RESULTS* Echocardiogram 2D Echocardiogram has been performed.  Ronald Colon 08/17/2017, 9:40 AM

## 2017-08-17 NOTE — Discharge Instructions (Signed)
Follow with Primary MD  [name], MD  and other consultant's as instructed your Hospitalist MD  Please get a complete blood count and chemistry panel checked by your Primary MD at your next visit, and again as instructed by your Primary MD.  Get Medicines reviewed and adjusted: Please take all your medications with you for your next visit with your Primary MD  Laboratory/radiological data: Please request your Primary MD to go over all hospital tests and procedure/radiological results at the follow up, please ask your Primary MD to get all Hospital records sent to his/her office.  In some cases, they will be blood work, cultures and biopsy results pending at the time of your discharge. Please request that your primary care M.D. follows up on these results.  Also Note the following: If you experience worsening of your admission symptoms, develop shortness of breath, life threatening emergency, suicidal or homicidal thoughts you must seek medical attention immediately by calling 911 or calling your MD immediately  if symptoms less severe.  You must read complete instructions/literature along with all the possible adverse reactions/side effects for all the Medicines you take and that have been prescribed to you. Take any new Medicines after you have completely understood and accpet all the possible adverse reactions/side effects.   Do not drive when taking Pain medications or sleeping medications (Benzodaizepines)  Do not take more than prescribed Pain, Sleep and Anxiety Medications. It is not advisable to combine anxiety,sleep and pain medications without talking with your primary care practitioner  Special Instructions: If you have smoked or chewed Tobacco  in the last 2 yrs please stop smoking, stop any regular Alcohol  and or any Recreational drug use.  Wear Seat belts while driving.  Please note: You were cared for by a hospitalist during your hospital stay. Once you are discharged,  your primary care physician will handle any further medical issues. Please note that NO REFILLS for any discharge medications will be authorized once you are discharged, as it is imperative that you return to your primary care physician (or establish a relationship with a primary care physician if you do not have one) for your post hospital discharge needs so that they can reassess your need for medications and monitor your lab values.

## 2017-08-17 NOTE — Progress Notes (Signed)
Removed both IV-clean, dry, and intact. Reviewed d/c paperwork and new meds with pt and son Discussed Doctor field's orders to bring the capsule study camera to [facility] around 9pm tonight or early in the morning. Wheeled stable pt to car.

## 2017-08-17 NOTE — Discharge Summary (Addendum)
Physician Discharge Summary  Ronald Colon ALP:[id number] DOB: 03/02/1929 DOA: 08/16/2017  PCP: [name], MD  Admit date: 08/16/2017 Discharge date: 08/17/2017  Admitted From: Home  Disposition: Home   Recommendations for Outpatient Follow-up:  1. Follow up with PCP in 1 weeks 2. Please obtain BMP/CBC in one week 3. Please refer to outpatient hematologist 4. Please follow up on the following pending results: 2D Echocardiogram   GI Consult Recommendations  1. HOLD ELIQUIS UNTIL GIVENS STUDY COMPLETE 2. IVFE TODAY AND RECHECK CBC/FERRITIN NEXT FRI 3. PROTONIX DAILY 4. HEMATOLOGY CONSULT AS OUTPT FOR MANAGEMENT OF CHRONIC ANEMIA 5. OK TO D/C HOME TODAY IF NO OTHER ONGOING MEDICAL ISSUES.  Discharge Condition: STABLE   CODE STATUS: FULL    Brief Hospitalization Summary: Please see all hospital notes, images, labs for full details of the hospitalization.  HPI: Ronald Colon is a 84 y.o. male with medical history significant for atrial fibrillation on Eliquis, diverticulosis, iron deficiency anemia, hypertension, gout, dyslipidemia, and prior CVA who presents to the emergency department after his primary care physician called him on account of a hemoglobin of 6.3.  He went to see his physician earlier today due to upper respiratory symptoms and had some blood work drawn.  Patient denies any chest pain or significant respiratory distress but does have some dyspnea on exertion which has been worsening over several weeks.  He denies edema or weight gain.  He denies any fever or chills, but has had a minimal cough which has been nonproductive.   ED Course: Vital signs are noted to be stable and patient is otherwise in sinus rhythm.  Labs demonstrate hemoglobin of 6.4 and hematocrit 22.2.  BNP was over 1200 and troponin was 0.10.  He has been started on Protonix drip with 2 units of PRBCs ordered.  GI is being contacted.  He appears comfortable and is currently in room air.  Acute blood loss  anemia likely secondary to GI bleed GI consultation for endoscopy S/p 2 unit PRBC transfusion Pt feels much better after transfusion.  He was seen by GI and he was felt safe to discharge home.  He was given dose of IV iron therapy.  He will need outpatient hematology consult.  He was told to hold eliquis until he completes [place] capsule study which will be followed up with Dr. Oneida Alar office.  They plan to recheck his CBC and ferritin in about 1 week.   Dyspnea on exertion with elevated BNP Suspect secondary to profound anemia.  2D echocardiogram.   Follow up results outpatient with PCP next week.  Study Conclusions  - Left ventricle: The cavity size was normal. Wall thickness was  increased in a pattern of mild LVH. Systolic function was mildly   reduced. The estimated ejection fraction was in the range of 45%  to 50%. Diffuse hypokinesis. Features are consistent with a   pseudonormal left ventricular filling pattern, with concomitant  abnormal relaxation and increased filling pressure (grade 2  diastolic dysfunction). Doppler parameters are consistent with  high ventricular filling pressure. - Aortic valve: Moderately calcified annulus. Trileaflet;   moderately thickened leaflets. There was mild to moderate  stenosis. There was mild regurgitation. Valve area (VTI): 1.35  cm^2. Valve area (Vmax): 1.45 cm^2. Valve area (Vmean): 1.15  cm^2. - Mitral valve: There was mild regurgitation. - Left atrium: The atrium was moderately dilated. - Right ventricle: The cavity size was mildly dilated. - Right atrium: The atrium was mildly dilated. - Atrial septum: No  defect or patent foramen ovale was identified. - Pulmonary arteries: Systolic pressure was moderately to severely increased. PA peak pressure: 67 mm Hg (S).  Atrial fibrillation-currently in sinus rhythm Monitored on telemetry Continue amiodarone Hold Eliquis until completes capsule study per GI.    Troponin elevation-not suggestive of  ACS Repeat troponins and monitor on telemetry  CKD stage III Currently stable-monitor  Hypertension-controlled Continue home medications  DVT prophylaxis: SCDs Code Status: Full Family Communication: None Disposition Plan:Home Consults called:GI  Discharge Diagnoses:  Principal Problem:   Acute blood loss anemia Active Problems:   Essential hypertension, benign   GERD (gastroesophageal reflux disease)   CKD (chronic kidney disease), stage III (HCC)   AVM (arteriovenous malformation) of colon   Heme positive stool   Dyspnea on exertion   Anemia  Discharge Instructions: Discharge Instructions    Call MD for:  difficulty breathing, headache or visual disturbances   Complete by:  As directed    Call MD for:  extreme fatigue   Complete by:  As directed    Call MD for:  persistant dizziness or light-headedness   Complete by:  As directed    Diet - low sodium heart healthy   Complete by:  As directed    Increase activity slowly   Complete by:  As directed      Allergies as of 08/17/2017   No Known Allergies     Medication List    STOP taking these medications   apixaban 5 MG Tabs tablet Commonly known as:  ELIQUIS   cephALEXin 500 MG capsule Commonly known as:  KEFLEX     TAKE these medications   acetaminophen 325 MG tablet Commonly known as:  TYLENOL Take 650 mg by mouth daily as needed for mild pain or headache.   allopurinol 300 MG tablet Commonly known as:  ZYLOPRIM Take 300 mg by mouth daily.   amiodarone 200 MG tablet Commonly known as:  PACERONE Take 0.5 tablets (100 mg total) by mouth daily.   atorvastatin 40 MG tablet Commonly known as:  LIPITOR Take 1 tablet (40 mg total) by mouth daily at 6 PM.   colchicine 0.6 MG tablet Take 0.6 mg by mouth 2 (two) times daily as needed (gout).   ezetimibe 10 MG tablet Commonly known as:  ZETIA Take 10 mg by mouth daily.   fish oil-omega-3 fatty acids 1000 MG capsule Take 1 g by mouth daily.    metoprolol tartrate 25 MG tablet Commonly known as:  LOPRESSOR Take 0.5 tablets (12.5 mg total) by mouth 2 (two) times daily.   pantoprazole 40 MG tablet Commonly known as:  PROTONIX Take 1 tablet (40 mg total) by mouth daily. What changed:    medication strength  how much to take      Follow-up Information    [name], MD. Schedule an appointment as soon as possible for a visit in 1 week(s).   Specialty:  Family Medicine Contact information: [address] [place] [address] [phone]        Jerene Bears, MD. Schedule an appointment as soon as possible for a visit in 1 week(s).   Specialty:  Gastroenterology Why:  Hospital Follow Up  Contact information: [address] [place] [address] [phone]          No Known Allergies Allergies as of 08/17/2017   No Known Allergies     Medication List    STOP taking these medications   apixaban 5 MG Tabs tablet  Commonly known as:  ELIQUIS   cephALEXin 500 MG capsule Commonly known as:  KEFLEX     TAKE these medications   acetaminophen 325 MG tablet Commonly known as:  TYLENOL Take 650 mg by mouth daily as needed for mild pain or headache.   allopurinol 300 MG tablet Commonly known as:  ZYLOPRIM Take 300 mg by mouth daily.   amiodarone 200 MG tablet Commonly known as:  PACERONE Take 0.5 tablets (100 mg total) by mouth daily.   atorvastatin 40 MG tablet Commonly known as:  LIPITOR Take 1 tablet (40 mg total) by mouth daily at 6 PM.   colchicine 0.6 MG tablet Take 0.6 mg by mouth 2 (two) times daily as needed (gout).   ezetimibe 10 MG tablet Commonly known as:  ZETIA Take 10 mg by mouth daily.   fish oil-omega-3 fatty acids 1000 MG capsule Take 1 g by mouth daily.   metoprolol tartrate 25 MG tablet Commonly known as:  LOPRESSOR Take 0.5 tablets (12.5 mg total) by mouth 2 (two) times daily.   pantoprazole 40 MG tablet Commonly known as:  PROTONIX Take 1 tablet (40 mg  total) by mouth daily. What changed:    medication strength  how much to take       Procedures/Studies: Dg Chest 2 View  Result Date: 08/16/2017 CLINICAL DATA:  84 y/o M; shortness of breath, weakness, cough, anemia. EXAM: CHEST  2 VIEW COMPARISON:  None. FINDINGS: Moderate cardiomegaly. Calcific aortic atherosclerosis. Coarse reticular opacities greatest in perihilar and lower lobes. Small bilateral pleural effusions. Small granuloma projecting over left anterior fourth rib. Bones are unremarkable. IMPRESSION: Coarse reticular markings may represent bronchitic changes and/or interstitial edema. Small bilateral effusions. Moderate cardiomegaly. Aortic atherosclerosis. Electronically Signed   By: Kristine Garbe M.D.   On: 08/16/2017 22:27      Subjective: Pt says that he feels much better after blood transfusion.  He really wants to go home now.    Discharge Exam: Vitals:   08/17/17 0206 08/17/17 0630  BP: (!) 156/62 (!) 153/54  Pulse: 64 64  Resp: 18 18  Temp: 98.9 F (37.2 C) 98.4 F (36.9 C)  SpO2: 96% 98%   Vitals:   08/16/17 2345 08/17/17 0000 08/17/17 0206 08/17/17 0630  BP: (!) 146/65 (!) 147/58 (!) 156/62 (!) 153/54  Pulse: 71 70 64 64  Resp: 18 18 18 18   Temp: 98.2 F (36.8 C) 98.7 F (37.1 C) 98.9 F (37.2 C) 98.4 F (36.9 C)  TempSrc: Oral Oral Oral Oral  SpO2: 100% 99% 96% 98%  Weight:      Height:        General: Pt is alert, awake, not in acute distress Cardiovascular: RRR, S1/S2 +, no rubs, no gallops Respiratory: CTA bilaterally, no wheezing, no rhonchi Abdominal: Soft, NT, ND, bowel sounds + Extremities: no edema, no cyanosis   The results of significant diagnostics from this hospitalization (including imaging, microbiology, ancillary and laboratory) are listed below for reference.     Microbiology: No results found for this or any previous visit (from the past 240 hour(s)).   Labs: BNP (last 3 results) Recent Labs     08/16/17 1830  BNP 1,062.6*   Basic Metabolic Panel: Recent Labs  Lab 08/16/17 1840 08/17/17 0323  NA 139 140  K 4.4 4.1  CL 110 112*  CO2 19* 19*  GLUCOSE 140* 106*  BUN 34* 31*  CREATININE 1.63* 1.39*  CALCIUM 8.9 8.4*   Liver Function Tests: Recent  Labs  Lab 08/16/17 1840 08/17/17 0323  AST 32 27  ALT 23 21  ALKPHOS 63 52  BILITOT 0.5 0.9  PROT 6.7 5.7*  ALBUMIN 3.6 3.1*   No results for input(s): LIPASE, AMYLASE in the last 168 hours. No results for input(s): AMMONIA in the last 168 hours. CBC: Recent Labs  Lab 08/16/17 1840 08/17/17 0323  WBC 6.2 5.8  HGB 6.4* 7.6*  HCT 22.2* 25.3*  MCV 79.6 80.8  PLT 287 246   Cardiac Enzymes: Recent Labs  Lab 08/16/17 1909 08/17/17 0323 08/17/17 0954  TROPONINI 0.10* 0.12* 0.14*   BNP: Invalid input(s): POCBNP CBG: No results for input(s): GLUCAP in the last 168 hours. D-Dimer No results for input(s): DDIMER in the last 72 hours. Hgb A1c No results for input(s): HGBA1C in the last 72 hours. Lipid Profile No results for input(s): CHOL, HDL, LDLCALC, TRIG, CHOLHDL, LDLDIRECT in the last 72 hours. Thyroid function studies No results for input(s): TSH, T4TOTAL, T3FREE, THYROIDAB in the last 72 hours.  Invalid input(s): FREET3 Anemia work up No results for input(s): VITAMINB12, FOLATE, FERRITIN, TIBC, IRON, RETICCTPCT in the last 72 hours. Urinalysis    Component Value Date/Time   COLORURINE YELLOW 10/02/2016 1048   APPEARANCEUR CLEAR 10/02/2016 1048   LABSPEC 1.012 10/02/2016 1048   PHURINE 5.0 10/02/2016 1048   GLUCOSEU NEGATIVE 10/02/2016 1048   HGBUR NEGATIVE 10/02/2016 1048   BILIRUBINUR NEGATIVE 10/02/2016 1048   KETONESUR NEGATIVE 10/02/2016 1048   PROTEINUR NEGATIVE 10/02/2016 1048   NITRITE NEGATIVE 10/02/2016 1048   LEUKOCYTESUR NEGATIVE 10/02/2016 1048   Sepsis Labs Invalid input(s): PROCALCITONIN,  WBC,  LACTICIDVEN Microbiology No results found for this or any previous visit (from the  past 240 hour(s)).  Time coordinating discharge:   SIGNED:  Irwin Brakeman, MD  [facility] 08/17/2017, 12:49 PM Pager [phone]  If 7PM-7AM, please contact night-coverage [email or web address] Password [email or web address]

## 2017-08-17 NOTE — Plan of Care (Signed)
Will continue to monitor.

## 2017-08-17 NOTE — Consult Note (Signed)
Referring Provider: No ref. provider found Primary Care Physician:  [name], MD Primary Gastroenterologist:  Barney Drain  Reason for Consultation:  PROFOUND ANEMIA   Impression: ADMITTED WITH PROFOUND ANEMIA. NO BRBPR OR MELENA. S/P 2u pRBCs AND FEELS BETTER. NOT SOB OR FEELING WEAK.   Plan: 1. HOLD ELIQUIS UNTIL GIVENS STUDY COMPLETE 2. IVFE TODAY AND RECHECK CBC/FERRITIN NEXT FRI 3. PROTONIX DAILY 4. HEMATOLOGY CONSULT AS OUTPT FOR MANAGEMENT OF CHRONIC ANEMIA 5. OK TO D/C HOME TODAY IF NO OTHER ONGOING MEDICAL ISSUES.      HPI:  SEEING DR. PYRTLE AND BELIEVES HE WAS ONLY SUPPOSE TO TAKE IRON ONCE DAILY, BUT STOPPED TAKING IT BECAUSE IT UPSETS HIS STOMACH. FERRITIN IN JUL 2018 24. PRESENTED WITH 203 WEEKS OF WEAKNESS AND SOB. SAW PCP AND CHECKED HB AND SENT PT TO ED FOR ADMISSION. CHANGE IN BOWEL HABITS YESTERDAY. BMs: A LITTLE LOOSE x2: NOT BLACK , 2 DAYS AGO NL SOLID STOOL. IRON CAUSES CONSTIPATION AND UPSET STOMACH.  PT DENIES FEVER, CHILLS, HEMATOCHEZIA, HEMATEMESIS, nausea, vomiting, melena, diarrhea, CHEST PAIN, CHANGE IN BOWEL IN HABITS, constipation, abdominal pain, problems swallowing, problems with sedation, OR heartburn or indigestion.    Past Medical History:  Diagnosis Date  . Aortic stenosis    Mild February 2018  . Arthritis   . Atrial fibrillation [facility])    Diagnosed January 2018  . Carotid stenosis   . Diverticulosis   . Essential hypertension, benign   . Gout   . Hx of adenomatous colonic polyps   . Hyperlipidemia   . IDA (iron deficiency anemia)   . Internal hemorrhoids   . Rheumatic fever 1937  . Stroke [facility]) 09/10/2016    Past Surgical History:  Procedure Laterality Date  . CATARACT EXTRACTION W/PHACO Right 08/02/2014   Procedure: CATARACT EXTRACTION PHACO AND INTRAOCULAR LENS PLACEMENT; CDE:  12.77;  Surgeon: Williams Che, MD;  Location: [facility];  Service: Ophthalmology;  Laterality: Right;  . CATARACT EXTRACTION W/PHACO Left  02/15/2015   Procedure: CATARACT EXTRACTION PHACO AND INTRAOCULAR LENS PLACEMENT (IOC);  Surgeon: Williams Che, MD;  Location: [facility];  Service: Ophthalmology;  Laterality: Left;  CDE 18.00  . COLONOSCOPY    . COLONOSCOPY WITH PROPOFOL N/A 12/13/2016   Procedure: COLONOSCOPY WITH PROPOFOL;  Surgeon: Jerene Bears, MD;  Location: [facility];  Service: Gastroenterology;  Laterality: N/A;  . ELBOW SURGERY Right    "grissle in there"  . ENDARTERECTOMY Left 09/17/2016   Procedure: LEFT CAROTID ENDARTECTOMY;  Surgeon: Waynetta Sandy, MD;  Location: [facility];  Service: Vascular;  Laterality: Left;  . ESOPHAGEAL DILATION  09/03/2013   Procedure: ESOPHAGEAL DILATION;  Surgeon: Rogene Houston, MD;  Location: [facility];  Service: Endoscopy;;  . ESOPHAGOGASTRODUODENOSCOPY N/A 09/03/2013   Procedure: ESOPHAGOGASTRODUODENOSCOPY (EGD);  Surgeon: Rogene Houston, MD;  Location: [facility];  Service: Endoscopy;  Laterality: N/A;  255-rescheduled to 1030 Ann to notify pt  . ESOPHAGOGASTRODUODENOSCOPY (EGD) WITH PROPOFOL N/A 12/13/2016   Procedure: ESOPHAGOGASTRODUODENOSCOPY (EGD) WITH PROPOFOL;  Surgeon: Jerene Bears, MD;  Location: [facility];  Service: Gastroenterology;  Laterality: N/A;  . PATCH ANGIOPLASTY Left 09/17/2016   Procedure: [place] 1 CM X6 CM;  Surgeon: Waynetta Sandy, MD;  Location: [place];  Service: Vascular;  Laterality: Left;    Prior to Admission medications   Medication Sig Start Date End Date Taking? Authorizing Provider  acetaminophen (TYLENOL) 325 MG tablet Take 650 mg by mouth daily as  needed for mild pain or headache.    Yes [provider]  allopurinol (ZYLOPRIM) 300 MG tablet Take 300 mg by mouth daily.   Yes [provider]  amiodarone (PACERONE) 200 MG tablet Take 0.5 tablets (100 mg total) by mouth daily. 05/07/17  Yes Satira Sark, MD  atorvastatin (LIPITOR) 40 MG tablet Take 1 tablet (40 mg  total) by mouth daily at 6 PM. 09/19/16  Yes Trinh, Janalyn Harder, PA-C  colchicine 0.6 MG tablet Take 0.6 mg by mouth 2 (two) times daily as needed (gout).    Yes [provider]  ezetimibe (ZETIA) 10 MG tablet Take 10 mg by mouth daily.   Yes [provider]  fish oil-omega-3 fatty acids 1000 MG capsule Take 1 g by mouth daily.   Yes [provider]  metoprolol tartrate (LOPRESSOR) 25 MG tablet Take 0.5 tablets (12.5 mg total) by mouth 2 (two) times daily. 01/08/17 08/16/17 Yes Satira Sark, MD  pantoprazole (PROTONIX) 20 MG tablet Take 1 tablet (20 mg total) by mouth daily. 09/26/16  Yes Satira Sark, MD  apixaban (ELIQUIS) 5 MG TABS tablet Take 1 tablet (5 mg total) by mouth 2 (two) times daily. 09/19/16   Alvia Grove, PA-C  cephALEXin (KEFLEX) 500 MG capsule  05/01/17   [provider]    Current Facility-Administered Medications  Medication Dose Route Frequency Provider Last Rate Last Dose  . acetaminophen (TYLENOL) tablet 650 mg  650 mg Oral Q6H PRN Heath Lark D, DO   650 mg at 08/17/17 9702   Or  . acetaminophen (TYLENOL) suppository 650 mg  650 mg Rectal Q6H PRN Manuella Ghazi, Pratik D, DO      . acetaminophen (TYLENOL) tablet 650 mg  650 mg Oral Daily PRN Manuella Ghazi, Pratik D, DO      . allopurinol (ZYLOPRIM) tablet 300 mg  300 mg Oral Daily Shah, Pratik D, DO      . amiodarone (PACERONE) tablet 100 mg  100 mg Oral Daily Manuella Ghazi, Pratik D, DO      . atorvastatin (LIPITOR) tablet 40 mg  40 mg Oral q1800 Manuella Ghazi, Pratik D, DO      . colchicine tablet 0.6 mg  0.6 mg Oral BID PRN Manuella Ghazi, Pratik D, DO      . ezetimibe (ZETIA) tablet 10 mg  10 mg Oral Daily Manuella Ghazi, Pratik D, DO      . metoprolol tartrate (LOPRESSOR) tablet 12.5 mg  12.5 mg Oral BID Manuella Ghazi, Pratik D, DO   12.5 mg at 08/17/17 0006  . ondansetron (ZOFRAN) tablet 4 mg  4 mg Oral Q6H PRN Manuella Ghazi, Pratik D, DO       Or  . ondansetron (ZOFRAN) injection 4 mg  4 mg Intravenous Q6H PRN Manuella Ghazi, Pratik D, DO      .  pantoprazole (PROTONIX) 80 mg in sodium chloride 0.9 % 250 mL (0.32 mg/mL) infusion  8 mg/hr Intravenous Continuous Julianne Rice, MD 25 mL/hr at 08/16/17 2213 8 mg/hr at 08/16/17 2213  . pantoprazole (PROTONIX) EC tablet 20 mg  20 mg Oral Daily Manuella Ghazi, Pratik D, DO        Allergies as of 08/16/2017  . (No Known Allergies)    Family History  Problem Relation Age of Onset  . CVA Mother 27  . Pneumonia Father 46  . Colon cancer Neg Hx      Social History   Socioeconomic History  . Marital status: Married    Spouse name: Not on  file  . Number of children: 1  . Years of education: Not on file  . Highest education level: Not on file  Social Needs  . Financial resource strain: Not on file  . Food insecurity - worry: Not on file  . Food insecurity - inability: Not on file  . Transportation needs - medical: Not on file  . Transportation needs - non-medical: Not on file  Occupational History  . Occupation: Retired  Tobacco Use  . Smoking status: Former Smoker    Packs/day: 1.00    Years: 20.00    Pack years: 20.00    Types: Cigarettes    Last attempt to quit: 1980    Years since quitting: 39.0  . Smokeless tobacco: Never Used  Substance and Sexual Activity  . Alcohol use: No  . Drug use: No  . Sexual activity: Not on file  Other Topics Concern  . Not on file  Social History Narrative  . Not on file    Review of Systems: PER HPI OTHERWISE ALL SYSTEMS ARE NEGATIVE.   Vitals: Blood pressure (!) 153/54, pulse 64, temperature 98.4 F (36.9 C), temperature source Oral, resp. rate 18, height 6\' 2"  (1.88 m), weight 193 lb 5.5 oz (87.7 kg), SpO2 98 %.  Physical Exam: General:   Alert,  Well-developed, well-nourished, pleasant and cooperative in NAD Head:  Normocephalic and atraumatic. Eyes:  Sclera clear, no icterus.   Conjunctiva pink. Mouth:  No lesions, dentition ABnormal. Neck:  Supple; no masses. Lungs:  Clear throughout to auscultation.   No wheezes. No acute  distress. Heart:  Regular rate and rhythm; no murmurs, clicks, rubs,  or gallops. Abdomen:  Soft, nontender and nondistended. No masses, hepatosplenomegaly or hernias noted. Normal bowel sounds, without guarding, and without rebound.   Msk:  Symmetrical without gross deformities. Normal posture. Extremities:  Without edema. Neurologic:  Alert and  oriented x4;  HARD OF HEARING, NO  NEW FOCAL DEFICITS Cervical Nodes:  No significant cervical adenopathy. Psych:  Alert and cooperative. Normal mood and affect.   Lab Results: Recent Labs    08/16/17 1840 08/17/17 0323  WBC 6.2 5.8  HGB 6.4* 7.6*  HCT 22.2* 25.3*  PLT 287 246   BMET Recent Labs    08/16/17 1840 08/17/17 0323  NA 139 140  K 4.4 4.1  CL 110 112*  CO2 19* 19*  GLUCOSE 140* 106*  BUN 34* 31*  CREATININE 1.63* 1.39*  CALCIUM 8.9 8.4*   LFT Recent Labs    08/17/17 0323  PROT 5.7*  ALBUMIN 3.1*  AST 27  ALT 21  ALKPHOS 52  BILITOT 0.9     Studies/Results: CXR JAN 4: CARDIOMEGALY,PLEURAL EFFUSIONS, MILD PULMONARY EDEMA   LOS: 1 day   Demarrion Meiklejohn  08/17/2017, 7:08 AM

## 2017-08-17 NOTE — Plan of Care (Signed)
Pt stated that he was not SOB.

## 2017-08-19 ENCOUNTER — Telehealth: Payer: Self-pay | Admitting: Internal Medicine

## 2017-08-19 ENCOUNTER — Telehealth: Payer: Self-pay | Admitting: Cardiology

## 2017-08-19 ENCOUNTER — Telehealth: Payer: Self-pay

## 2017-08-19 MED ORDER — BIFERA 28 MG PO TABS: ORAL_TABLET | ORAL | 11 refills | Status: DC

## 2017-08-19 NOTE — Telephone Encounter (Signed)
Tried to call Ronald Colon and could not leave a message. Left Vm for [name] to call.

## 2017-08-19 NOTE — Telephone Encounter (Signed)
Ronald Colon is aware. He will speak to his father about GI in [place] and let us know when we call with results.

## 2017-08-19 NOTE — Telephone Encounter (Signed)
Patient was admitted to [facility].  Son notified that Dr. Artis Flock office performed the capsule endoscopy and that they will need to contact her for the results. Son is provided the contact information to her her office. Dr. Hilarie Fredrickson see ED notes.  Do you want me to arrange follow up?

## 2017-08-19 NOTE — Telephone Encounter (Addendum)
SON RETURNED CALL. EXPLAINED RESULTS: AVMs AND DIVERTICULOSIS. PT MAY RE-START ELIQUIS AND NEEDS BIFERA BID. IF IT CAUSES GI UPSET , PT SHOULD CALL AND WE CAN ARRANGE FOR IVFE. CBC/FERRITIN IN ONE MO. OUTPATIENT VISIT IN 3 MOS E30 FEDA/AVMs WITH [facility].

## 2017-08-19 NOTE — Telephone Encounter (Signed)
REVIEWED. PLEASE CALL PT's son. Capsule RESULTS WILL BE AVAILABLE THIS AFTERNOON. HIS DAD SAID HE WANTED TO FOLLOW WITH A GASTROENTEROLOGIST IN [place]. HE CAN LET us KNOW WHAT THEY DECIDE WHEN WE CALL IM WITH IS RESULTS.

## 2017-08-19 NOTE — Telephone Encounter (Signed)
Pt was admitted here at [facility]-- hemoglobin was low, they changed 2 of his meds.   They changed the dose on his pantoprazole (PROTONIX) 40 MG tablet [[id number]]  and stopped his Eliquis. Please give his son [name] a call @ [phone]

## 2017-08-19 NOTE — Telephone Encounter (Signed)
REVIEWED-NO ADDITIONAL RECOMMENDATIONS. 

## 2017-08-19 NOTE — Addendum Note (Signed)
Addended by: Danie Binder on: 08/19/2017 03:54 PM   Modules accepted: Orders

## 2017-08-19 NOTE — Telephone Encounter (Addendum)
Called patient TO DISCUSS RESULTS. [facility] HAS NO VM. LEFT VM FOR MR. Seyfried.   PLEASE CALL PT. HE HAS AVMs AND SMALL BOWEL DIVERTICULOSIS. HE CAN RE-START ELIQUIS TODAY. BEGIN IRON SUPPLEMENTS: BIFERA BID. RX HAS BEEN SENT TO [facility]. RECHECK CBC/FERRITIN IN ONE MO. OUTPATIENT VISIT IN 3 MOS WITH DR. PYRTLE OR DR. Tyjah Hai.

## 2017-08-19 NOTE — Telephone Encounter (Signed)
Noted. Now on Protonix with Eliquis on hold pending further GI workup of blood loss anemia including capsule study. Agree.

## 2017-08-19 NOTE — Telephone Encounter (Signed)
I will forward FYI to Dr.McDowell

## 2017-08-19 NOTE — Telephone Encounter (Signed)
PT's son, Kenzo Ozment, called to get results from capsule study done by Dr. Oneida Alar on 08/17/2017.  He said pt is a pt of Dr. Hilarie Fredrickson, but had to come to ED over the weekend and saw Dr. Oneida Alar. Quita Skye can be reached with results when available @ first number [phone] or [phone].  He said he is not sure his dad would understand results if he is contacted.

## 2017-08-20 ENCOUNTER — Telehealth: Payer: Self-pay | Admitting: Gastroenterology

## 2017-08-20 ENCOUNTER — Other Ambulatory Visit: Payer: Self-pay

## 2017-08-20 ENCOUNTER — Telehealth: Payer: Self-pay

## 2017-08-20 ENCOUNTER — Encounter (HOSPITAL_COMMUNITY): Payer: Self-pay | Admitting: Gastroenterology

## 2017-08-20 DIAGNOSIS — D509 Iron deficiency anemia, unspecified: Secondary | ICD-10-CM

## 2017-08-20 NOTE — Telephone Encounter (Signed)
Patient cleared to resume Eliquis by Dr.Fields. Son aware.I spoke with patient and he has already taken morning dose

## 2017-08-20 NOTE — Telephone Encounter (Signed)
REV scheduled with son for 09/02/17 at 10:30 with Nicoletta Ba PA

## 2017-08-20 NOTE — Telephone Encounter (Addendum)
OPV IN 3 MOS. CBC/FERRITIN IN 4 WEEKS.

## 2017-08-20 NOTE — Telephone Encounter (Signed)
followup with me or APP is recommended

## 2017-08-20 NOTE — Telephone Encounter (Deleted)
-----   Message from Everardo All, LPN sent at 12/18/3460 11:11 AM EST ----- Regarding: RE: Is pt cleared to resme Eliquis ? Barnetta Chapel, please see Dr. Nona Dell phone note from yesterday. She did say he could resume Eliquis.  ----- Message ----- From: Bernita Raisin, RN Sent: 08/20/2017  10:55 AM To: Everardo All, LPN Subject: Is pt cleared to resme Eliquis ?               Hello, I received a message from [name] , he was confused about his Eliquis.I called his son, Ronald Colon, he states Dr.Fields called him at 4 pm yesterday at work and told him he could resume Eliquis.Is this true ? I don't see any documentation. Thanks for your help.

## 2017-08-20 NOTE — Telephone Encounter (Signed)
Lab orders on file for 09/20/2017.

## 2017-08-20 NOTE — Telephone Encounter (Signed)
Pt needs CBC/Ferritin done in one month per [facility]

## 2017-08-20 NOTE — Telephone Encounter (Signed)
Reminder in epic °

## 2017-08-23 DIAGNOSIS — Z681 Body mass index (BMI) 19 or less, adult: Secondary | ICD-10-CM | POA: Diagnosis not present

## 2017-08-23 DIAGNOSIS — Z1389 Encounter for screening for other disorder: Secondary | ICD-10-CM | POA: Diagnosis not present

## 2017-08-23 DIAGNOSIS — I10 Essential (primary) hypertension: Secondary | ICD-10-CM | POA: Diagnosis not present

## 2017-08-23 DIAGNOSIS — K5730 Diverticulosis of large intestine without perforation or abscess without bleeding: Secondary | ICD-10-CM | POA: Diagnosis not present

## 2017-08-23 DIAGNOSIS — M1040 Other secondary gout, unspecified site: Secondary | ICD-10-CM | POA: Diagnosis not present

## 2017-08-23 DIAGNOSIS — D500 Iron deficiency anemia secondary to blood loss (chronic): Secondary | ICD-10-CM | POA: Diagnosis not present

## 2017-08-23 DIAGNOSIS — M1990 Unspecified osteoarthritis, unspecified site: Secondary | ICD-10-CM | POA: Diagnosis not present

## 2017-08-23 DIAGNOSIS — E119 Type 2 diabetes mellitus without complications: Secondary | ICD-10-CM | POA: Diagnosis not present

## 2017-08-23 DIAGNOSIS — E782 Mixed hyperlipidemia: Secondary | ICD-10-CM | POA: Diagnosis not present

## 2017-08-23 DIAGNOSIS — Z86010 Personal history of colonic polyps: Secondary | ICD-10-CM | POA: Diagnosis not present

## 2017-08-23 NOTE — Procedures (Signed)
INDICATION: TRANSFUSION DEPENDENT ANEMIA. HB DRIFTED DOWN FROM JUL 2018 TO DEC 2018.  PATIENT DATA: 193 LBS, HEIGHT: 62 IN, WAIST: 40  IN, GASTRIC PASSAGE TIME: 17 m, SB PASSAGE TIME: 8H 54m  RESULTS: LIMITED views of gastric mucosa due to retained contents. No blood in the stomach. MULTIPLE SMALL AND LARGE SMALL BOWEL AVMs (00:30:40 TO 45:62:56).  No masses,OR ULCERS SEEN. NO OLD BLOOD OR FRESH BLOOD SEEN IN THE SMALL BOWEL OR COLON. LIMITED VIEWS OF THE COLON DUE TO RETAINED CONTENTS.  DIAGNOSIS: TRANSFUSION DEPENDENT ANEMIA DUE TO SMALL BOWEL AVMs IN THE SETTING OF ANTICOAGULATION.   Plan: 1.MAY RE-START ELIQUIS AND NEEDS BIFERA BID. IF IT CAUSES GI UPSET , PT SHOULD CALL AND WE CAN ARRANGE FOR IVFE. 2. CBC/FERRITIN IN ONE MO.  3. OUTPATIENT VISIT IN 3 MOS E30 FEDA/AVMs WITH [facility].

## 2017-08-25 ENCOUNTER — Other Ambulatory Visit: Payer: Self-pay | Admitting: Vascular Surgery

## 2017-08-28 ENCOUNTER — Ambulatory Visit: Payer: Medicare Other | Admitting: Nurse Practitioner

## 2017-09-02 ENCOUNTER — Encounter: Payer: Self-pay | Admitting: Physician Assistant

## 2017-09-02 ENCOUNTER — Other Ambulatory Visit (INDEPENDENT_AMBULATORY_CARE_PROVIDER_SITE_OTHER): Payer: Medicare Other

## 2017-09-02 ENCOUNTER — Other Ambulatory Visit: Payer: Self-pay

## 2017-09-02 ENCOUNTER — Ambulatory Visit (INDEPENDENT_AMBULATORY_CARE_PROVIDER_SITE_OTHER): Payer: Medicare Other | Admitting: Physician Assistant

## 2017-09-02 VITALS — BP 142/68 | HR 72 | Ht 72.0 in | Wt 188.0 lb

## 2017-09-02 DIAGNOSIS — D509 Iron deficiency anemia, unspecified: Secondary | ICD-10-CM

## 2017-09-02 DIAGNOSIS — K558 Other vascular disorders of intestine: Secondary | ICD-10-CM | POA: Diagnosis not present

## 2017-09-02 DIAGNOSIS — K5521 Angiodysplasia of colon with hemorrhage: Secondary | ICD-10-CM

## 2017-09-02 LAB — CBC WITH DIFFERENTIAL/PLATELET
Basophils Absolute: 0.1 10*3/uL (ref 0.0–0.1)
Basophils Relative: 1.1 % (ref 0.0–3.0)
EOS ABS: 0.2 10*3/uL (ref 0.0–0.7)
EOS PCT: 1.7 % (ref 0.0–5.0)
HEMATOCRIT: 33.1 % — AB (ref 39.0–52.0)
HEMOGLOBIN: 10.3 g/dL — AB (ref 13.0–17.0)
LYMPHS PCT: 15.2 % (ref 12.0–46.0)
Lymphs Abs: 1.4 10*3/uL (ref 0.7–4.0)
MCHC: 31.1 g/dL (ref 30.0–36.0)
MCV: 79.8 fl (ref 78.0–100.0)
MONO ABS: 1.1 10*3/uL — AB (ref 0.1–1.0)
Monocytes Relative: 12.1 % — ABNORMAL HIGH (ref 3.0–12.0)
Neutro Abs: 6.5 10*3/uL (ref 1.4–7.7)
Neutrophils Relative %: 69.9 % (ref 43.0–77.0)
Platelets: 329 10*3/uL (ref 150.0–400.0)
RBC: 4.15 Mil/uL — AB (ref 4.22–5.81)
RDW: 21.6 % — ABNORMAL HIGH (ref 11.5–15.5)
WBC: 9.3 10*3/uL (ref 4.0–10.5)

## 2017-09-02 LAB — IBC PANEL
IRON: 41 ug/dL — AB (ref 42–165)
SATURATION RATIOS: 8 % — AB (ref 20.0–50.0)
Transferrin: 364 mg/dL — ABNORMAL HIGH (ref 212.0–360.0)

## 2017-09-02 LAB — FERRITIN: FERRITIN: 21.4 ng/mL — AB (ref 22.0–322.0)

## 2017-09-02 MED ORDER — INTEGRA PLUS PO CAPS: 1.0000 | ORAL_CAPSULE | Freq: Every morning | ORAL | 3 refills | Status: DC

## 2017-09-02 NOTE — Progress Notes (Addendum)
Subjective:    Patient ID: Ronald Colon, male    DOB: 08-17-1928, 84 y.o.   MRN: [id number]  HPI [name]" is a very nice 84 year old white male, established with Dr. Hilarie Fredrickson who was initially seen in spring of 2018. He has history of iron deficiency anemia felt secondary to AVMs. Patient has history of hypertension, atrial fibrillation, prior CVA for which she is maintained on eliquis, history of colon polyps and chronic kidney disease stage III. He had undergone endoscopy and colonoscopy per Dr. Hilarie Fredrickson in May 2018. EGD was normal, and colonoscopy revealed mild diverticulosis, 2 polyps were removed and he had a cecal AVM which was cauterized. Most recently patient had admission to [facility] on 08/16/2017. He had presented to his PCP with weakness and fatigue and was found to have a hemoglobin of 6.4. He was transfused 2 units of packed RBCs, it appears that he received 1 IV iron infusion. He was seen in consultation by [facility]/Dr. Oneida Alar and underwent capsule endoscopy. Apparently the capsule endoscopy did reveal AVMs, by Dr. Oneida Alar notes, though I can't tell exactly where the AVMs were seen. Patient was to start a new iron supplementation as he does not tolerate oral iron well. They were told that this product is no longer available so he is not currently on oral iron. His hemoglobin checked through his primary care provider on 08/23/2017 and hemoglobin was up to 9.3 hematocrit of 31.4 and MCV of 81. Patient says he is been feeling pretty well. He and his wife have both been sick with an upper respiratory infection that he says is getting over that period He denies any excessive fatigue or shortness of breath. He has no GI complaints. He says his stools have been appearing normal. His eliquis was held for about 4 days after that admission earlier this month and he has been back on it since.  Review of Systems Pertinent positive and negative review of systems were noted in the above  HPI section.  All other review of systems was otherwise negative.  Outpatient Encounter Medications as of 09/02/2017  Medication Sig  . acetaminophen (TYLENOL) 325 MG tablet Take 650 mg by mouth daily as needed for mild pain or headache.   . allopurinol (ZYLOPRIM) 300 MG tablet Take 300 mg by mouth daily.  Marland Kitchen amiodarone (PACERONE) 200 MG tablet Take 0.5 tablets (100 mg total) by mouth daily.  Marland Kitchen atorvastatin (LIPITOR) 40 MG tablet Take 1 tablet (40 mg total) by mouth daily at 6 PM.  . colchicine 0.6 MG tablet Take 0.6 mg by mouth 2 (two) times daily as needed (gout).   Marland Kitchen ezetimibe (ZETIA) 10 MG tablet Take 10 mg by mouth daily.  . fish oil-omega-3 fatty acids 1000 MG capsule Take 1 g by mouth daily.  . pantoprazole (PROTONIX) 40 MG tablet Take 1 tablet (40 mg total) by mouth daily.  Marland Kitchen FeFum-FePoly-FA-B Cmp-C-Biot (INTEGRA PLUS) CAPS Take 1 capsule by mouth every morning.  . metoprolol tartrate (LOPRESSOR) 25 MG tablet Take 0.5 tablets (12.5 mg total) by mouth 2 (two) times daily.  . Polysacch Fe Cmp-Fe Heme Poly (BIFERA) 28 MG TABS 1 po bid (Patient not taking: Reported on 09/02/2017)   No facility-administered encounter medications on file as of 09/02/2017.    No Known Allergies Patient Active Problem List   Diagnosis Date Noted  . Acute blood loss anemia 08/16/2017  . Dyspnea on exertion 08/16/2017  . Anemia 08/16/2017  . Iron deficiency anemia   .  AVM (arteriovenous malformation) of colon   . Heme positive stool   . Benign neoplasm of cecum   . Benign neoplasm of ascending colon   . Benign neoplasm of transverse colon   . Benign neoplasm of descending colon   . Right arm numbness 10/02/2016  . Cerebral infarction due to occlusion or stenosis of precerebral artery ([place])   . Hyperlipidemia   . New onset atrial fibrillation ([place])   . Anticoagulation adequate   . Elevated troponin   . Carotid stenosis 09/12/2016  . Cerebrovascular accident (CVA) due to stenosis of left middle cerebral  artery ([place]) 09/10/2016  . Hyperglycemia 09/10/2016  . CKD (chronic kidney disease), stage III ([place]) 09/10/2016  . GERD (gastroesophageal reflux disease) 08/24/2013  . Heart murmur 08/18/2013  . Irregular heartbeat 08/18/2013  . Essential hypertension, benign 08/18/2013   Social History   Socioeconomic History  . Marital status: Married    Spouse name: Not on file  . Number of children: 1  . Years of education: Not on file  . Highest education level: Not on file  Social Needs  . Financial resource strain: Not on file  . Food insecurity - worry: Not on file  . Food insecurity - inability: Not on file  . Transportation needs - medical: Not on file  . Transportation needs - non-medical: Not on file  Occupational History  . Occupation: Retired  Tobacco Use  . Smoking status: Former Smoker    Packs/day: 1.00    Years: 20.00    Pack years: 20.00    Types: Cigarettes    Last attempt to quit: 1980    Years since quitting: 39.0  . Smokeless tobacco: Never Used  Substance and Sexual Activity  . Alcohol use: No  . Drug use: No  . Sexual activity: Not on file  Other Topics Concern  . Not on file  Social History Narrative  . Not on file    Mr. Mertz's family history includes CVA (age of onset: 74) in his mother; Pneumonia (age of onset: 16) in his father.      Objective:    Vitals:   09/02/17 1013  BP: (!) 142/68  Pulse: 72    Physical Exam well-developed elderly white male in no acute distress, very pleasant accompanied by his son blood pressure 142/68 pulse 72, height 6 foot, weight 188, BMI 25.5. HEENT ;nontraumatic normocephalic EOMI PERRLA sclera anicteric, Lips pale, Cardiovascular ;regular rate and rhythm with S1-S2, Pulmonary; clear bilaterally, Abdomen; soft, nontender nondistended bowel sounds are active there is no palpable mass or hepatosplenomegaly, Rectal; exam not done, Extremities; no clubbing cyanosis or edema skin warm and dry, Neuropsych ;mood and affect  appropriate       Assessment & Plan:   #51 84 year old white male with history of iron deficiency anemia, in setting of chronic anticoagulation and previously documented colonic AVM who required readmission to the hospital earlier this month that [facility] with profound anemia and hemoglobin of 6.4. Capsule endoscopy was done per Dr. Larose Kells finding of small bowel AVMs which were not treated  #2 history of previous CVA #3 atrial fibrillation #4 hypertension #5 chronic kidney disease stage III #6 tubular adenomatous colon polyp found on colonoscopy May 2018 #7 diverticulosis  Plan; Will repeat CBC and iron studies today Have sent new prescription for Integra plus one by mouth every morning To my knowledge patient only received 1 iron  infusion during recent hospitalization, and may benefit from a second infusion, will review  most recent iron counts first I discussed the importance of patient paying close  attention to his symptoms of anemia i.e. increased fatigue dyspnea with exertion etc. and to have his hemoglobin checked promptly should he have any abrupt onset of weakness etc..  We will plan to follow serial CBCs, he may prefer to have these done by his PCP at [facility] family practice.-For now would check every 2 weeks. If he has significant drop in hemoglobin and he may require enteroscopy for APC of more proximal small bowel AVMs. Patient will follow-up with Dr. Hilarie Fredrickson in 6 weeks or sooner as needed. Wynter Grave Genia Harold PA-C 09/02/2017   Cc: [name], MD   Addendum: Reviewed and agree with initial management. Pyrtle, Lajuan Lines, MD

## 2017-09-02 NOTE — Patient Instructions (Addendum)
Please go to the basement level to have your labs drawn.  We have sent the following medications to your pharmacy for you to pick up at your convenience: [place], [place].  1. Integra Plus iron capsules.  Call us for any problems with bleeding, extreme weakness and fatigue.   We made you an appointment with Dr. Hilarie Fredrickson for 11-05-2017 at 10:00 am.

## 2017-09-04 ENCOUNTER — Other Ambulatory Visit: Payer: Self-pay

## 2017-09-04 DIAGNOSIS — D500 Iron deficiency anemia secondary to blood loss (chronic): Secondary | ICD-10-CM

## 2017-09-17 NOTE — Progress Notes (Signed)
Cardiology Office Note  Date: 09/18/2017   ID: Ronald Colon 30-Apr-1929, MRN [id number]  PCP: [name], MD  Primary Cardiologist: [name], MD   Chief Complaint  Patient presents with  . PAF    History of Present Illness: Ronald Colon is an 84 y.o. male last seen in September 2018.  He is here today with his son for a follow-up visit. Interval records reviewed.  Patient was hospitalized in early January with severe symptomatic anemia, hemoglobin down to 6.3.  He was treated with packed red cell transfusions for GI bleed, underwent gastroenterology consultation and endoscopy.  Eliquis was stopped, history of small bowel AVMs.  He underwent a capsule study demonstrating AVMs and small bowel diverticulosis, ultimately Eliquis was resumed by Dr. Oneida Alar.  He does not report any obvious melena or hematochezia, states he feels better and has a follow-up hemoglobin pending with PCP soon.  He is also been on oral iron supplements.  He tells me that he continues to follow with Dr. Hilarie Fredrickson in [place] as well.   Recent follow-up echocardiogram from January of this year is outlined below.  Past Medical History:  Diagnosis Date  . Aortic stenosis    Mild February 2018  . Arthritis   . Atrial fibrillation [facility])    Diagnosed January 2018  . Carotid stenosis   . Diverticulosis   . Essential hypertension, benign   . Gout   . Hx of adenomatous colonic polyps   . Hyperlipidemia   . IDA (iron deficiency anemia)   . Internal hemorrhoids   . Rheumatic fever 1937  . Stroke [facility]) 09/10/2016    Past Surgical History:  Procedure Laterality Date  . CATARACT EXTRACTION W/PHACO Right 08/02/2014   Procedure: CATARACT EXTRACTION PHACO AND INTRAOCULAR LENS PLACEMENT; CDE:  12.77;  Surgeon: Williams Che, MD;  Location: [facility];  Service: Ophthalmology;  Laterality: Right;  . CATARACT EXTRACTION W/PHACO Left 02/15/2015   Procedure: CATARACT EXTRACTION PHACO AND INTRAOCULAR LENS  PLACEMENT (IOC);  Surgeon: Williams Che, MD;  Location: [facility];  Service: Ophthalmology;  Laterality: Left;  CDE 18.00  . COLONOSCOPY    . COLONOSCOPY WITH PROPOFOL N/A 12/13/2016   Procedure: COLONOSCOPY WITH PROPOFOL;  Surgeon: Jerene Bears, MD;  Location: [facility];  Service: Gastroenterology;  Laterality: N/A;  . ELBOW SURGERY Right    "grissle in there"  . ENDARTERECTOMY Left 09/17/2016   Procedure: LEFT CAROTID ENDARTECTOMY;  Surgeon: Waynetta Sandy, MD;  Location: [place];  Service: Vascular;  Laterality: Left;  . ESOPHAGEAL DILATION  09/03/2013   Procedure: ESOPHAGEAL DILATION;  Surgeon: Rogene Houston, MD;  [facility];  Service: Endoscopy;;  . ESOPHAGOGASTRODUODENOSCOPY N/A 09/03/2013   Procedure: ESOPHAGOGASTRODUODENOSCOPY (EGD);  Surgeon: Rogene Houston, MD;  [facility];  Service: Endoscopy;  Laterality: N/A;  255-rescheduled to 1030 Ann to notify pt  . ESOPHAGOGASTRODUODENOSCOPY (EGD) WITH PROPOFOL N/A 12/13/2016   Procedure: ESOPHAGOGASTRODUODENOSCOPY (EGD) WITH PROPOFOL;  Surgeon: Jerene Bears, MD;  Location: [facility];  Service: Gastroenterology;  Laterality: N/A;  . GIVENS CAPSULE STUDY N/A 08/17/2017   Procedure: GIVENS CAPSULE STUDY;  Surgeon: Danie Binder, MD;  [facility];  Service: Endoscopy;  Laterality: N/A;  . PATCH ANGIOPLASTY Left 09/17/2016   Procedure: [place] 1 CM X6 CM;  Surgeon: Waynetta Sandy, MD;  Location: [facility] OR;  Service: Vascular;  Laterality: Left;    Current Outpatient Medications  Medication Sig Dispense Refill  . acetaminophen (TYLENOL) 325 MG tablet Take 650 mg by mouth daily as needed for mild pain or headache.     . allopurinol (ZYLOPRIM) 300 MG tablet Take 300 mg by mouth daily.    Marland Kitchen amiodarone (PACERONE) 200 MG tablet Take 0.5 tablets (100 mg total) by mouth daily. 45 tablet 3  . atorvastatin (LIPITOR) 40 MG tablet Take 1 tablet (40 mg total) by  mouth daily at 6 PM. 30 tablet 11  . colchicine 0.6 MG tablet Take 0.6 mg by mouth 2 (two) times daily as needed (gout).     Marland Kitchen ELIQUIS 5 MG TABS tablet Take 1 tablet by mouth 2 (two) times daily.    Marland Kitchen ezetimibe (ZETIA) 10 MG tablet Take 10 mg by mouth daily.    Marland Kitchen FeFum-FePoly-FA-B Cmp-C-Biot (INTEGRA PLUS) CAPS Take 1 capsule by mouth every morning. 90 capsule 3  . fish oil-omega-3 fatty acids 1000 MG capsule Take 1 g by mouth daily.    . metoprolol tartrate (LOPRESSOR) 25 MG tablet Take 0.5 tablets (12.5 mg total) by mouth 2 (two) times daily. 90 tablet 3  . pantoprazole (PROTONIX) 40 MG tablet Take 1 tablet (40 mg total) by mouth daily. 30 tablet 0   No current facility-administered medications for this visit.    Allergies:  Patient has no known allergies.   Social History: The patient  reports that he quit smoking about 39 years ago. His smoking use included cigarettes. He has a 20.00 pack-year smoking history. he has never used smokeless tobacco. He reports that he does not drink alcohol or use drugs.   ROS:  Please see the history of present illness. Otherwise, complete review of systems is positive for hearing loss, arthritic stiffness.  All other systems are reviewed and negative.   Physical Exam: VS:  BP 140/60   Pulse (!) 50   Ht 6\' 2"  (1.88 m)   Wt 191 lb 3.2 oz (86.7 kg)   SpO2 98%   BMI 24.55 kg/m , BMI Body mass index is 24.55 kg/m.  Wt Readings from Last 3 Encounters:  09/18/17 191 lb 3.2 oz (86.7 kg)  09/02/17 188 lb (85.3 kg)  08/16/17 193 lb 5.5 oz (87.7 kg)    General: Elderly male, appears comfortable at rest. HEENT: Conjunctiva and lids normal, oropharynx clear. Neck: Supple, no elevated JVP or carotid bruits, no thyromegaly. Lungs: Clear to auscultation, nonlabored breathing at rest. Cardiac: Regular rate and rhythm, no S3, 2/6 systolic murmur. Abdomen: Soft, nontender, bowel sounds present, no guarding or rebound. Extremities: Trace ankle edema, distal  pulses 2+. Skin: Warm and dry. Musculoskeletal: No kyphosis. Neuropsychiatric: Alert and oriented x3, affect grossly appropriate.  ECG: I personally reviewed the tracing from 08/16/2017 which showed sinus rhythm with prolonged PR interval and incomplete left bundle branch block.  Recent Labwork: 05/08/2017: TSH 2.005 08/16/2017: B Natriuretic Peptide 1,266.0 08/17/2017: ALT 21; AST 27; BUN 31; Creatinine, Ser 1.39; Potassium 4.1; Sodium 140 09/02/2017: Hemoglobin 10.3; Platelets 329.0     Component Value Date/Time   CHOL 178 09/11/2016 0252   TRIG 139 09/11/2016 0252   HDL 37 (L) 09/11/2016 0252   CHOLHDL 4.8 09/11/2016 0252   VLDL 28 09/11/2016 0252   LDLCALC 113 (H) 09/11/2016 0252    Other Studies Reviewed Today:  Echocardiogram 08/17/2017: Study Conclusions  - Left ventricle: The cavity size was normal. Wall thickness was   increased in a pattern of mild LVH. Systolic function was mildly   reduced.  The estimated ejection fraction was in the range of 45%   to 50%. Diffuse hypokinesis. Features are consistent with a   pseudonormal left ventricular filling pattern, with concomitant   abnormal relaxation and increased filling pressure (grade 2   diastolic dysfunction). Doppler parameters are consistent with   high ventricular filling pressure. - Aortic valve: Moderately calcified annulus. Trileaflet;   moderately thickened leaflets. There was mild to moderate   stenosis. There was mild regurgitation. Valve area (VTI): 1.35   cm^2. Valve area (Vmax): 1.45 cm^2. Valve area (Vmean): 1.15   cm^2. - Mitral valve: There was mild regurgitation. - Left atrium: The atrium was moderately dilated. - Right ventricle: The cavity size was mildly dilated. - Right atrium: The atrium was mildly dilated. - Atrial septum: No defect or patent foramen ovale was identified. - Pulmonary arteries: Systolic pressure was moderately to severely   increased. PA peak pressure: 67 mm Hg (S).  Carotid  Dopplers 05/03/2017: Patent left CEA site with 1-39% RICA stenosis.  Assessment and Plan:  1.  Paroxysmal atrial fibrillation with CHADSVASC score of 5.  Rhythm has been well controlled on low-dose amiodarone and Lopressor.  For the time being he has been placed back on Eliquis, although I am concerned that he is likely to have recurrent GI bleeding with known AVMs.  Unfortunately, there is not a good alternative in terms of stroke prophylaxis that would have any less bleeding risk.  He stated that he would like to stay on Eliquis for now, but I think if he has another significant GI bleed, it will have to be stopped realizing that stroke risk will remain elevated.  2.  History of left CEA and previous stroke.  No new symptoms.  3.  Mild to moderate aortic stenosis, asymptomatic.  Recent echocardiogram reviewed.  4.  History of GI bleed with previous resection of polyps and ablation of angiodysplastic lesion.  He also has internal hemorrhoids and known AVMs.  Current medicines were reviewed with the patient today.  Disposition: Follow-up in 3 months.  Signed, Satira Sark, MD, [facility] 09/18/2017 10:23 AM    [facility] [place]. [address] [place], [place] [address] Phone: [phone]; Fax: [phone]

## 2017-09-18 ENCOUNTER — Ambulatory Visit (INDEPENDENT_AMBULATORY_CARE_PROVIDER_SITE_OTHER): Payer: Medicare Other | Admitting: Cardiology

## 2017-09-18 ENCOUNTER — Encounter: Payer: Self-pay | Admitting: Cardiology

## 2017-09-18 ENCOUNTER — Other Ambulatory Visit: Payer: Self-pay

## 2017-09-18 VITALS — BP 140/60 | HR 50 | Ht 74.0 in | Wt 191.2 lb

## 2017-09-18 DIAGNOSIS — Z8719 Personal history of other diseases of the digestive system: Secondary | ICD-10-CM

## 2017-09-18 DIAGNOSIS — I350 Nonrheumatic aortic (valve) stenosis: Secondary | ICD-10-CM | POA: Diagnosis not present

## 2017-09-18 DIAGNOSIS — Z8673 Personal history of transient ischemic attack (TIA), and cerebral infarction without residual deficits: Secondary | ICD-10-CM | POA: Diagnosis not present

## 2017-09-18 DIAGNOSIS — I6522 Occlusion and stenosis of left carotid artery: Secondary | ICD-10-CM | POA: Diagnosis not present

## 2017-09-18 DIAGNOSIS — I480 Paroxysmal atrial fibrillation: Secondary | ICD-10-CM

## 2017-09-18 NOTE — Patient Instructions (Signed)

## 2017-09-20 DIAGNOSIS — Z1389 Encounter for screening for other disorder: Secondary | ICD-10-CM | POA: Diagnosis not present

## 2017-09-20 DIAGNOSIS — E782 Mixed hyperlipidemia: Secondary | ICD-10-CM | POA: Diagnosis not present

## 2017-09-20 DIAGNOSIS — Z86010 Personal history of colonic polyps: Secondary | ICD-10-CM | POA: Diagnosis not present

## 2017-09-20 DIAGNOSIS — K5730 Diverticulosis of large intestine without perforation or abscess without bleeding: Secondary | ICD-10-CM | POA: Diagnosis not present

## 2017-09-23 ENCOUNTER — Telehealth: Payer: Self-pay | Admitting: Cardiology

## 2017-09-23 NOTE — Telephone Encounter (Signed)
FYI- Patient calling to advise of labwork done @ PCP and a drop in Hemoglobin. / tg

## 2017-09-23 NOTE — Telephone Encounter (Signed)
Patient is followed by GI, Hgb is 10.3 which is up from  7.6 drawn on 08/17/17

## 2017-10-03 DIAGNOSIS — D509 Iron deficiency anemia, unspecified: Secondary | ICD-10-CM | POA: Diagnosis not present

## 2017-10-03 DIAGNOSIS — E663 Overweight: Secondary | ICD-10-CM | POA: Diagnosis not present

## 2017-10-03 DIAGNOSIS — J069 Acute upper respiratory infection, unspecified: Secondary | ICD-10-CM | POA: Diagnosis not present

## 2017-10-03 DIAGNOSIS — Z6825 Body mass index (BMI) 25.0-25.9, adult: Secondary | ICD-10-CM | POA: Diagnosis not present

## 2017-10-09 ENCOUNTER — Encounter: Payer: Self-pay | Admitting: Gastroenterology

## 2017-10-09 DIAGNOSIS — J111 Influenza due to unidentified influenza virus with other respiratory manifestations: Secondary | ICD-10-CM | POA: Diagnosis not present

## 2017-10-09 DIAGNOSIS — Z6825 Body mass index (BMI) 25.0-25.9, adult: Secondary | ICD-10-CM | POA: Diagnosis not present

## 2017-10-09 DIAGNOSIS — E663 Overweight: Secondary | ICD-10-CM | POA: Diagnosis not present

## 2017-10-22 ENCOUNTER — Encounter: Payer: Self-pay | Admitting: *Deleted

## 2017-10-24 DIAGNOSIS — D649 Anemia, unspecified: Secondary | ICD-10-CM | POA: Diagnosis not present

## 2017-11-01 ENCOUNTER — Encounter: Payer: Self-pay | Admitting: Vascular Surgery

## 2017-11-01 ENCOUNTER — Ambulatory Visit (HOSPITAL_COMMUNITY)
Admission: RE | Admit: 2017-11-01 | Discharge: 2017-11-01 | Disposition: A | Discharge status: Home / Self Care | Payer: Medicare Other | Source: Ambulatory Visit | Attending: Vascular Surgery | Admitting: Vascular Surgery

## 2017-11-01 ENCOUNTER — Ambulatory Visit (INDEPENDENT_AMBULATORY_CARE_PROVIDER_SITE_OTHER): Payer: Medicare Other | Admitting: Vascular Surgery

## 2017-11-01 ENCOUNTER — Other Ambulatory Visit: Payer: Self-pay

## 2017-11-01 VITALS — BP 140/64 | HR 53 | Temp 97.4°F | Resp 20 | Ht 74.0 in | Wt 192.0 lb

## 2017-11-01 DIAGNOSIS — Z9889 Other specified postprocedural states: Secondary | ICD-10-CM | POA: Diagnosis not present

## 2017-11-01 DIAGNOSIS — I6521 Occlusion and stenosis of right carotid artery: Secondary | ICD-10-CM | POA: Diagnosis not present

## 2017-11-01 DIAGNOSIS — I6522 Occlusion and stenosis of left carotid artery: Secondary | ICD-10-CM

## 2017-11-01 NOTE — Progress Notes (Signed)
Patient ID: Ronald Colon, male   DOB: [birth year], 84 y.o.   MRN: [id number]  Reason for Consult: Follow-up (6 month f/u )   Referred by [name], MD  Subjective:     HPI:  Ronald Colon is a 84 y.o. male history of a left-sided stroke with high-grade carotid stenosis.  At that time he underwent left carotid endarterectomy from which he has done very well.  He is maintained on blood thinners for atrial fibrillation now not on aspirin per the direction of his cardiologist.  He has had no further neurologic complaints.  He continues to be very active.  He follows up today with repeat carotid duplex.  Past Medical History:  Diagnosis Date  . Aortic stenosis    Mild February 2018  . Arthritis   . Atrial fibrillation [facility])    Diagnosed January 2018  . AVM (arteriovenous malformation)   . Carotid stenosis   . Diverticulosis   . Essential hypertension, benign   . Gout   . Hx of adenomatous colonic polyps   . Hyperlipidemia   . IDA (iron deficiency anemia)   . Internal hemorrhoids   . Rheumatic fever 1937  . Stroke ([place]) 09/10/2016  . Tubular adenoma of colon    Family History  Problem Relation Age of Onset  . CVA Mother 67  . Pneumonia Father 4  . Colon cancer Neg Hx    Past Surgical History:  Procedure Laterality Date  . CATARACT EXTRACTION W/PHACO Right 08/02/2014   Procedure: CATARACT EXTRACTION PHACO AND INTRAOCULAR LENS PLACEMENT; CDE:  12.77;  Surgeon: Williams Che, MD;  [facility];  Service: Ophthalmology;  Laterality: Right;  . CATARACT EXTRACTION W/PHACO Left 02/15/2015   Procedure: CATARACT EXTRACTION PHACO AND INTRAOCULAR LENS PLACEMENT (IOC);  Surgeon: Williams Che, MD;  Location: [facility];  Service: Ophthalmology;  Laterality: Left;  CDE 18.00  . COLONOSCOPY    . COLONOSCOPY WITH PROPOFOL N/A 12/13/2016   Procedure: COLONOSCOPY WITH PROPOFOL;  Surgeon: Jerene Bears, MD;  [facility];  Service: Gastroenterology;  Laterality: N/A;  .  ELBOW SURGERY Right    "grissle in there"  . ENDARTERECTOMY Left 09/17/2016   Procedure: LEFT CAROTID ENDARTECTOMY;  Surgeon: Waynetta Sandy, MD;  Location: [place];  Service: Vascular;  Laterality: Left;  . ESOPHAGEAL DILATION  09/03/2013   Procedure: ESOPHAGEAL DILATION;  Surgeon: Rogene Houston, MD;  [facility];  Service: Endoscopy;;  . ESOPHAGOGASTRODUODENOSCOPY N/A 09/03/2013   Procedure: ESOPHAGOGASTRODUODENOSCOPY (EGD);  Surgeon: Rogene Houston, MD;  [facility];  Service: Endoscopy;  Laterality: N/A;  255-rescheduled to 1030 Ann to notify pt  . ESOPHAGOGASTRODUODENOSCOPY (EGD) WITH PROPOFOL N/A 12/13/2016   Procedure: ESOPHAGOGASTRODUODENOSCOPY (EGD) WITH PROPOFOL;  Surgeon: Jerene Bears, MD;  [facility];  Service: Gastroenterology;  Laterality: N/A;  . GIVENS CAPSULE STUDY N/A 08/17/2017   Procedure: GIVENS CAPSULE STUDY;  Surgeon: Danie Binder, MD;  [facility];  Service: Endoscopy;  Laterality: N/A;  . PATCH ANGIOPLASTY Left 09/17/2016   Procedure: [place] 1 CM X6 CM;  Surgeon: Waynetta Sandy, MD;  Location: [place];  Service: Vascular;  Laterality: Left;    Short Social History:  Social History   Tobacco Use  . Smoking status: Former Smoker    Packs/day: 1.00    Years: 20.00    Pack years: 20.00    Types: Cigarettes    Last attempt to  quit: 1980    Years since quitting: 39.2  . Smokeless tobacco: Never Used  Substance Use Topics  . Alcohol use: No    No Known Allergies  Current Outpatient Medications  Medication Sig Dispense Refill  . acetaminophen (TYLENOL) 325 MG tablet Take 650 mg by mouth daily as needed for mild pain or headache.     . allopurinol (ZYLOPRIM) 300 MG tablet Take 300 mg by mouth daily.    Marland Kitchen amiodarone (PACERONE) 200 MG tablet Take 0.5 tablets (100 mg total) by mouth daily. 45 tablet 3  . atorvastatin (LIPITOR) 40 MG tablet Take 1 tablet (40 mg  total) by mouth daily at 6 PM. 30 tablet 11  . colchicine 0.6 MG tablet Take 0.6 mg by mouth 2 (two) times daily as needed (gout).     Marland Kitchen ELIQUIS 5 MG TABS tablet Take 1 tablet by mouth 2 (two) times daily.    Marland Kitchen ezetimibe (ZETIA) 10 MG tablet Take 10 mg by mouth daily.    Marland Kitchen FeFum-FePoly-FA-B Cmp-C-Biot (INTEGRA PLUS) CAPS Take 1 capsule by mouth every morning. 90 capsule 3  . fish oil-omega-3 fatty acids 1000 MG capsule Take 1 g by mouth daily.    . metoprolol tartrate (LOPRESSOR) 25 MG tablet Take 0.5 tablets (12.5 mg total) by mouth 2 (two) times daily. 90 tablet 3  . pantoprazole (PROTONIX) 40 MG tablet Take 1 tablet (40 mg total) by mouth daily. 30 tablet 0   No current facility-administered medications for this visit.     Review of Systems  Constitutional:  Constitutional negative. HENT: HENT negative.  Eyes: Eyes negative.  Respiratory: Respiratory negative.  Cardiovascular: Cardiovascular negative.  GI: Gastrointestinal negative.  Musculoskeletal: Musculoskeletal negative.  Skin: Skin negative.  Neurological: Neurological negative. Hematologic: Hematologic/lymphatic negative.  Psychiatric: Psychiatric negative.        Objective:  Objective   Vitals:   11/01/17 1051 11/01/17 1055  BP: (!) 157/62 140/64  Pulse: (!) 53   Resp: 20   Temp: (!) 97.4 F (36.3 C)   TempSrc: Oral   SpO2: 98%   Weight: 192 lb (87.1 kg)   Height: 6\' 2"  (1.88 m)    Body mass index is 24.65 kg/m.  Physical Exam  Constitutional: He is oriented to person, place, and time. He appears well-developed.  HENT:  Head: Normocephalic.  Neck: Normal range of motion.  Well healed incision left neck  Cardiovascular: Normal rate.  Abdominal: Soft.  Musculoskeletal: Normal range of motion. He exhibits no edema.  Neurological: He is alert and oriented to person, place, and time. No cranial nerve deficit.  Skin: Skin is warm.  Psychiatric: He has a normal mood and affect. His behavior is normal.  Thought content normal.    Data: I have independently interpreted his carotid duplex which demonstrates right-sided heterogeneous plaque with 1-39% stenosis in the left side and homogeneous plaque with patent carotid endarterectomy although there is an ECA stenosis greater than 50%.     Assessment/Plan:     84 year old male follows up with carotid duplex after previous carotid endarterectomy for symptomatically disease.  At this point he is doing very well.  He will follow-up in 1 year with repeat carotid duplex.  Should he have issues before that time we can certainly see him sooner.  We again discussed the signs and symptoms of stroke which he demonstrates good understanding of the presence of his son.     Waynetta Sandy MD [facility]

## 2017-11-05 ENCOUNTER — Ambulatory Visit: Payer: Medicare Other | Admitting: Internal Medicine

## 2017-11-20 DIAGNOSIS — D649 Anemia, unspecified: Secondary | ICD-10-CM | POA: Diagnosis not present

## 2018-01-13 ENCOUNTER — Other Ambulatory Visit: Payer: Self-pay | Admitting: Vascular Surgery

## 2018-02-10 DIAGNOSIS — E119 Type 2 diabetes mellitus without complications: Secondary | ICD-10-CM | POA: Diagnosis not present

## 2018-02-10 DIAGNOSIS — E782 Mixed hyperlipidemia: Secondary | ICD-10-CM | POA: Diagnosis not present

## 2018-02-10 DIAGNOSIS — I10 Essential (primary) hypertension: Secondary | ICD-10-CM | POA: Diagnosis not present

## 2018-02-10 DIAGNOSIS — Z125 Encounter for screening for malignant neoplasm of prostate: Secondary | ICD-10-CM | POA: Diagnosis not present

## 2018-02-17 DIAGNOSIS — M549 Dorsalgia, unspecified: Secondary | ICD-10-CM | POA: Diagnosis not present

## 2018-02-27 DIAGNOSIS — I10 Essential (primary) hypertension: Secondary | ICD-10-CM | POA: Diagnosis not present

## 2018-02-27 DIAGNOSIS — I714 Abdominal aortic aneurysm, without rupture: Secondary | ICD-10-CM | POA: Diagnosis not present

## 2018-02-27 DIAGNOSIS — Z6825 Body mass index (BMI) 25.0-25.9, adult: Secondary | ICD-10-CM | POA: Diagnosis not present

## 2018-02-27 DIAGNOSIS — Z0001 Encounter for general adult medical examination with abnormal findings: Secondary | ICD-10-CM | POA: Diagnosis not present

## 2018-02-27 DIAGNOSIS — E119 Type 2 diabetes mellitus without complications: Secondary | ICD-10-CM | POA: Diagnosis not present

## 2018-02-27 DIAGNOSIS — Z1389 Encounter for screening for other disorder: Secondary | ICD-10-CM | POA: Diagnosis not present

## 2018-02-27 DIAGNOSIS — K5730 Diverticulosis of large intestine without perforation or abscess without bleeding: Secondary | ICD-10-CM | POA: Diagnosis not present

## 2018-03-25 NOTE — Progress Notes (Signed)
Cardiology Office Note  Date: 03/26/2018   ID: Manoj, Enriquez [birth year], MRN [id number]  PCP: [name], MD  Primary Cardiologist: [name], MD   Chief Complaint  Patient presents with  . PAF     History of Present Illness: Ronald Colon is an 84 y.o. male last seen in February.  He is here today with his son for a follow-up visit.  He reports no palpitations or chest pain.  Denies any significant change in stools, no obvious bleeding episodes.  I reviewed his medications which are outlined below.  He continues on Eliquis, amiodarone, and Lopressor.  He reports having recent lab work with Larene Pickett which we are requesting.  He is following with Dr. Donzetta Matters with [facility], overall has mild ICA stenoses at 1 to 39% based on most recent assessment.  Past Medical History:  Diagnosis Date  . Aortic stenosis    Mild February 2018  . Arthritis   . Atrial fibrillation [facility])    Diagnosed January 2018  . AVM (arteriovenous malformation)   . Carotid stenosis   . Diverticulosis   . Essential hypertension, benign   . Gout   . Hx of adenomatous colonic polyps   . Hyperlipidemia   . IDA (iron deficiency anemia)   . Internal hemorrhoids   . Rheumatic fever 1937  . Stroke ([place]) 09/10/2016  . Tubular adenoma of colon     Past Surgical History:  Procedure Laterality Date  . CATARACT EXTRACTION W/PHACO Right 08/02/2014   Procedure: CATARACT EXTRACTION PHACO AND INTRAOCULAR LENS PLACEMENT; CDE:  12.77;  Surgeon: Williams Che, MD;  [facility];  Service: Ophthalmology;  Laterality: Right;  . CATARACT EXTRACTION W/PHACO Left 02/15/2015   Procedure: CATARACT EXTRACTION PHACO AND INTRAOCULAR LENS PLACEMENT (IOC);  Surgeon: Williams Che, MD;  [facility];  Service: Ophthalmology;  Laterality: Left;  CDE 18.00  . COLONOSCOPY    . COLONOSCOPY WITH PROPOFOL N/A 12/13/2016   Procedure: COLONOSCOPY WITH PROPOFOL;  Surgeon: Jerene Bears, MD;  [facility];  Service:  Gastroenterology;  Laterality: N/A;  . ELBOW SURGERY Right    "grissle in there"  . ENDARTERECTOMY Left 09/17/2016   Procedure: LEFT CAROTID ENDARTECTOMY;  Surgeon: Waynetta Sandy, MD;  Location: [place];  Service: Vascular;  Laterality: Left;  . ESOPHAGEAL DILATION  09/03/2013   Procedure: ESOPHAGEAL DILATION;  Surgeon: Rogene Houston, MD;  [facility];  Service: Endoscopy;;  . ESOPHAGOGASTRODUODENOSCOPY N/A 09/03/2013   Procedure: ESOPHAGOGASTRODUODENOSCOPY (EGD);  Surgeon: Rogene Houston, MD;  [facility];  Service: Endoscopy;  Laterality: N/A;  255-rescheduled to 1030 Ann to notify pt  . ESOPHAGOGASTRODUODENOSCOPY (EGD) WITH PROPOFOL N/A 12/13/2016   Procedure: ESOPHAGOGASTRODUODENOSCOPY (EGD) WITH PROPOFOL;  Surgeon: Jerene Bears, MD;  [facility];  Service: Gastroenterology;  Laterality: N/A;  . GIVENS CAPSULE STUDY N/A 08/17/2017   Procedure: GIVENS CAPSULE STUDY;  Surgeon: Danie Binder, MD;  [facility];  Service: Endoscopy;  Laterality: N/A;  . PATCH ANGIOPLASTY Left 09/17/2016   Procedure: [place] 1 CM X6 CM;  Surgeon: Waynetta Sandy, MD;  [facility];  Service: Vascular;  Laterality: Left;    Current Outpatient Medications  Medication Sig Dispense Refill  . acetaminophen (TYLENOL) 325 MG tablet Take 650 mg by mouth daily as needed for mild pain or headache.     . allopurinol (ZYLOPRIM) 300 MG tablet Take 300 mg by mouth  daily.    . amiodarone (PACERONE) 200 MG tablet Take 0.5 tablets (100 mg total) by mouth daily. 45 tablet 3  . atorvastatin (LIPITOR) 40 MG tablet Take 1 tablet (40 mg total) by mouth daily at 6 PM. 30 tablet 11  . colchicine 0.6 MG tablet Take 0.6 mg by mouth 2 (two) times daily as needed (gout).     Marland Kitchen ELIQUIS 5 MG TABS tablet Take 1 tablet by mouth 2 (two) times daily.    Marland Kitchen ezetimibe (ZETIA) 10 MG tablet Take 10 mg by mouth daily.    Marland Kitchen FeFum-FePoly-FA-B  Cmp-C-Biot (INTEGRA PLUS) CAPS Take 1 capsule by mouth every morning. 90 capsule 3  . fish oil-omega-3 fatty acids 1000 MG capsule Take 1 g by mouth daily.    . metoprolol tartrate (LOPRESSOR) 25 MG tablet Take 0.5 tablets (12.5 mg total) by mouth 2 (two) times daily. 90 tablet 3  . pantoprazole (PROTONIX) 40 MG tablet Take 1 tablet (40 mg total) by mouth daily. 30 tablet 0   No current facility-administered medications for this visit.    Allergies:  Patient has no known allergies.   Social History: The patient  reports that he quit smoking about 39 years ago. His smoking use included cigarettes. He has a 20.00 pack-year smoking history. He has never used smokeless tobacco. He reports that he does not drink alcohol or use drugs.   ROS:  Please see the history of present illness. Otherwise, complete review of systems is positive for chronic back pain.  All other systems are reviewed and negative.   Physical Exam: VS:  BP (!) 146/60   Pulse (!) 47   Ht 6\' 2"  (1.88 m)   Wt 200 lb 12.8 oz (91.1 kg)   SpO2 97%   BMI 25.78 kg/m , BMI Body mass index is 25.78 kg/m.  Wt Readings from Last 3 Encounters:  03/26/18 200 lb 12.8 oz (91.1 kg)  11/01/17 192 lb (87.1 kg)  09/18/17 191 lb 3.2 oz (86.7 kg)    General: Elderly male, appears comfortable at rest.  Using a walking stick. HEENT: Conjunctiva and lids normal, oropharynx clear. Neck: Supple, no elevated JVP or carotid bruits, no thyromegaly. Lungs: Clear to auscultation, nonlabored breathing at rest. Cardiac: Regular rate and rhythm, no S3, 2/6 systolic murmur. Abdomen: Soft, nontender, bowel sounds present. Extremities: Trace ankle edema, distal pulses 2+. Skin: Warm and dry. Musculoskeletal: No kyphosis. Neuropsychiatric: Alert and oriented x3, affect grossly appropriate.  ECG: I personally reviewed the tracing from 08/16/2017 which showed sinus rhythm with prolonged PR interval and incomplete left bundle branch block.  Recent  Labwork: 05/08/2017: TSH 2.005 08/16/2017: B Natriuretic Peptide 1,266.0 08/17/2017: ALT 21; AST 27; BUN 31; Creatinine, Ser 1.39; Potassium 4.1; Sodium 140 09/02/2017: Hemoglobin 10.3; Platelets 329.0     Component Value Date/Time   CHOL 178 09/11/2016 0252   TRIG 139 09/11/2016 0252   HDL 37 (L) 09/11/2016 0252   CHOLHDL 4.8 09/11/2016 0252   VLDL 28 09/11/2016 0252   LDLCALC 113 (H) 09/11/2016 0252    Other Studies Reviewed Today:  Echocardiogram 08/17/2017: Study Conclusions  - Left ventricle: The cavity size was normal. Wall thickness was increased in a pattern of mild LVH. Systolic function was mildly reduced. The estimated ejection fraction was in the range of 45% to 50%. Diffuse hypokinesis. Features are consistent with a pseudonormal left ventricular filling pattern, with concomitant abnormal relaxation and increased filling pressure (grade 2 diastolic dysfunction). Doppler parameters are consistent with high  ventricular filling pressure. - Aortic valve: Moderately calcified annulus. Trileaflet; moderately thickened leaflets. There was mild to moderate stenosis. There was mild regurgitation. Valve area (VTI): 1.35 cm^2. Valve area (Vmax): 1.45 cm^2. Valve area (Vmean): 1.15 cm^2. - Mitral valve: There was mild regurgitation. - Left atrium: The atrium was moderately dilated. - Right ventricle: The cavity size was mildly dilated. - Right atrium: The atrium was mildly dilated. - Atrial septum: No defect or patent foramen ovale was identified. - Pulmonary arteries: Systolic pressure was moderately to severely increased. PA peak pressure: 67 mm Hg (S).  Carotid Dopplers 05/03/2017: Patent left CEA site with 1-39% RICA stenosis.  Assessment and Plan:  1.  Paroxysmal atrial fibrillation with CHADSVASC score of 5.  He is symptomatically stable on current regimen, recent lab work being requested.  So far he is tolerating resumption of Eliquis, although  does remain at risk for recurrent GI bleeding with known AVMs.  2.  History of previous stroke and left CEA.  He is following with [facility] and has mild residual atherosclerosis documented at this time.  He continues on statin therapy.  3.  Mild to moderate aortic stenosis, last assessed in January of this year.  No change on examination.  He is asymptomatic.  4.  History of GI bleed with previous resection of polyps and ablation of angiodysplastic lesion.  He has internal hemorrhoids and AVMs.  Current medicines were reviewed with the patient today.  Disposition: Follow-up in 6 months.  Signed, Satira Sark, MD, [facility] 03/26/2018 11:28 AM    [place] at [facility] [address], [place],  [address] Phone: [phone]; Fax: [phone]

## 2018-03-26 ENCOUNTER — Encounter: Payer: Self-pay | Admitting: Cardiology

## 2018-03-26 ENCOUNTER — Ambulatory Visit (INDEPENDENT_AMBULATORY_CARE_PROVIDER_SITE_OTHER): Payer: Medicare Other | Admitting: Cardiology

## 2018-03-26 VITALS — BP 146/60 | HR 47 | Ht 74.0 in | Wt 200.8 lb

## 2018-03-26 DIAGNOSIS — I480 Paroxysmal atrial fibrillation: Secondary | ICD-10-CM | POA: Diagnosis not present

## 2018-03-26 DIAGNOSIS — Z8719 Personal history of other diseases of the digestive system: Secondary | ICD-10-CM | POA: Diagnosis not present

## 2018-03-26 DIAGNOSIS — I6522 Occlusion and stenosis of left carotid artery: Secondary | ICD-10-CM

## 2018-03-26 DIAGNOSIS — Z8673 Personal history of transient ischemic attack (TIA), and cerebral infarction without residual deficits: Secondary | ICD-10-CM

## 2018-03-26 DIAGNOSIS — I350 Nonrheumatic aortic (valve) stenosis: Secondary | ICD-10-CM | POA: Diagnosis not present

## 2018-03-26 NOTE — Patient Instructions (Signed)

## 2018-05-08 DIAGNOSIS — E663 Overweight: Secondary | ICD-10-CM | POA: Diagnosis not present

## 2018-05-08 DIAGNOSIS — Z6826 Body mass index (BMI) 26.0-26.9, adult: Secondary | ICD-10-CM | POA: Diagnosis not present

## 2018-05-08 DIAGNOSIS — M545 Low back pain: Secondary | ICD-10-CM | POA: Diagnosis not present

## 2018-05-08 DIAGNOSIS — M5410 Radiculopathy, site unspecified: Secondary | ICD-10-CM | POA: Diagnosis not present

## 2018-05-23 DIAGNOSIS — Z23 Encounter for immunization: Secondary | ICD-10-CM | POA: Diagnosis not present

## 2018-06-27 DIAGNOSIS — E119 Type 2 diabetes mellitus without complications: Secondary | ICD-10-CM | POA: Diagnosis not present

## 2018-06-27 DIAGNOSIS — E663 Overweight: Secondary | ICD-10-CM | POA: Diagnosis not present

## 2018-06-27 DIAGNOSIS — I10 Essential (primary) hypertension: Secondary | ICD-10-CM | POA: Diagnosis not present

## 2018-06-27 DIAGNOSIS — I639 Cerebral infarction, unspecified: Secondary | ICD-10-CM | POA: Diagnosis not present

## 2018-06-27 DIAGNOSIS — Z6827 Body mass index (BMI) 27.0-27.9, adult: Secondary | ICD-10-CM | POA: Diagnosis not present

## 2018-06-27 DIAGNOSIS — M5136 Other intervertebral disc degeneration, lumbar region: Secondary | ICD-10-CM | POA: Diagnosis not present

## 2018-06-27 DIAGNOSIS — Z1389 Encounter for screening for other disorder: Secondary | ICD-10-CM | POA: Diagnosis not present

## 2018-07-17 ENCOUNTER — Other Ambulatory Visit: Payer: Self-pay | Admitting: Family Medicine

## 2018-07-17 ENCOUNTER — Telehealth: Payer: Self-pay | Admitting: Cardiology

## 2018-07-17 DIAGNOSIS — G8929 Other chronic pain: Secondary | ICD-10-CM

## 2018-07-17 DIAGNOSIS — M545 Low back pain, unspecified: Secondary | ICD-10-CM

## 2018-07-17 NOTE — Telephone Encounter (Signed)
I spoke with patient, he verbalized understanding to hold 48-72 hours before injection and re-start 24 hours afterwords if there is no bleeding at site

## 2018-07-17 NOTE — Telephone Encounter (Signed)
Would hold Eliquis at least 48-72 hours prior to epidural.  May generally resume within 24 hours presuming there is no residual bleeding or other concerns.

## 2018-07-17 NOTE — Telephone Encounter (Signed)
Patient has questions in regards to when to stop Eliquis prior to injection in his back and then when to start it back afterwards. / tg

## 2018-07-17 NOTE — Telephone Encounter (Signed)
I saw referral for lumbar L4, L5 epidural by [facility] medical, scheduled at [facility] on 07/25/18

## 2018-07-25 ENCOUNTER — Other Ambulatory Visit: Payer: Medicare Other

## 2018-08-04 ENCOUNTER — Telehealth: Payer: Self-pay

## 2018-08-04 NOTE — Telephone Encounter (Signed)
Pt.'s son Wilburt Messina) called regarding stopping pt's Eloquis prior to injections in back. Cardiology instructed pt. To hold Eloquis 48-72 hours prior to injections and restart it 24 hours after as long as there is no residual bleeding. Pt.'s son wanted to confirm that this was appropriate. Dr. Trula Slade was in agreement. Pt. Son verbalized understanding.

## 2018-08-05 ENCOUNTER — Other Ambulatory Visit: Payer: Medicare Other

## 2018-08-07 DIAGNOSIS — E119 Type 2 diabetes mellitus without complications: Secondary | ICD-10-CM | POA: Diagnosis not present

## 2018-08-11 ENCOUNTER — Ambulatory Visit
Admission: RE | Admit: 2018-08-11 | Discharge: 2018-08-11 | Disposition: A | Discharge status: Home / Self Care | Payer: Medicare Other | Source: Ambulatory Visit | Attending: Family Medicine | Admitting: Family Medicine

## 2018-08-11 DIAGNOSIS — M5116 Intervertebral disc disorders with radiculopathy, lumbar region: Secondary | ICD-10-CM | POA: Diagnosis not present

## 2018-08-11 DIAGNOSIS — M545 Low back pain, unspecified: Secondary | ICD-10-CM

## 2018-08-11 DIAGNOSIS — G8929 Other chronic pain: Secondary | ICD-10-CM

## 2018-08-11 MED ORDER — METHYLPREDNISOLONE ACETATE 40 MG/ML INJ SUSP (RADIOLOG
120.0000 mg | Freq: Once | INTRAMUSCULAR | Status: AC
  Administered 2018-08-11: 120 mg via EPIDURAL

## 2018-08-11 MED ORDER — IOPAMIDOL (ISOVUE-M 200) INJECTION 41%
1.0000 mL | Freq: Once | INTRAMUSCULAR | Status: AC
  Administered 2018-08-11: 1 mL via EPIDURAL

## 2018-08-11 NOTE — Discharge Instructions (Signed)

## 2018-09-22 ENCOUNTER — Other Ambulatory Visit: Payer: Self-pay | Admitting: Family Medicine

## 2018-09-22 DIAGNOSIS — M545 Low back pain, unspecified: Secondary | ICD-10-CM

## 2018-09-22 DIAGNOSIS — G8929 Other chronic pain: Secondary | ICD-10-CM

## 2018-09-22 NOTE — Progress Notes (Deleted)
Cardiology Office Note  Date: 09/22/2018   ID: [name], Broadnax [birth year], MRN [id number]  PCP: [name], MD  Primary Cardiologist: [name], MD   No chief complaint on file.   History of Present Illness: Ronald Colon is an 85 y.o. male last seen in August 2019.  Past Medical History:  Diagnosis Date  . Aortic stenosis    Mild February 2018  . Arthritis   . Atrial fibrillation [facility])    Diagnosed January 2018  . AVM (arteriovenous malformation)   . Carotid stenosis   . Diverticulosis   . Essential hypertension, benign   . Gout   . Hx of adenomatous colonic polyps   . Hyperlipidemia   . IDA (iron deficiency anemia)   . Internal hemorrhoids   . Rheumatic fever 1937  . Stroke ([place]) 09/10/2016  . Tubular adenoma of colon     Past Surgical History:  Procedure Laterality Date  . CATARACT EXTRACTION W/PHACO Right 08/02/2014   Procedure: CATARACT EXTRACTION PHACO AND INTRAOCULAR LENS PLACEMENT; CDE:  12.77;  Surgeon: Williams Che, MD;  [facility];  Service: Ophthalmology;  Laterality: Right;  . CATARACT EXTRACTION W/PHACO Left 02/15/2015   Procedure: CATARACT EXTRACTION PHACO AND INTRAOCULAR LENS PLACEMENT (IOC);  Surgeon: Williams Che, MD;  Location: [facility];  Service: Ophthalmology;  Laterality: Left;  CDE 18.00  . COLONOSCOPY    . COLONOSCOPY WITH PROPOFOL N/A 12/13/2016   Procedure: COLONOSCOPY WITH PROPOFOL;  Surgeon: Jerene Bears, MD;  [facility];  Service: Gastroenterology;  Laterality: N/A;  . ELBOW SURGERY Right    "grissle in there"  . ENDARTERECTOMY Left 09/17/2016   Procedure: LEFT CAROTID ENDARTECTOMY;  Surgeon: Waynetta Sandy, MD;  Location: [place];  Service: Vascular;  Laterality: Left;  . ESOPHAGEAL DILATION  09/03/2013   Procedure: ESOPHAGEAL DILATION;  Surgeon: Rogene Houston, MD;  [facility];  Service: Endoscopy;;  . ESOPHAGOGASTRODUODENOSCOPY N/A 09/03/2013   Procedure:  ESOPHAGOGASTRODUODENOSCOPY (EGD);  Surgeon: Rogene Houston, MD;  [facility];  Service: Endoscopy;  Laterality: N/A;  255-rescheduled to 1030 Ann to notify pt  . ESOPHAGOGASTRODUODENOSCOPY (EGD) WITH PROPOFOL N/A 12/13/2016   Procedure: ESOPHAGOGASTRODUODENOSCOPY (EGD) WITH PROPOFOL;  Surgeon: Jerene Bears, MD;  [facility];  Service: Gastroenterology;  Laterality: N/A;  . GIVENS CAPSULE STUDY N/A 08/17/2017   Procedure: GIVENS CAPSULE STUDY;  Surgeon: Danie Binder, MD;  [facility];  Service: Endoscopy;  Laterality: N/A;  . PATCH ANGIOPLASTY Left 09/17/2016   Procedure: [place] 1 CM X6 CM;  Surgeon: Waynetta Sandy, MD;  Location: [facility] OR;  Service: Vascular;  Laterality: Left;    Current Outpatient Medications  Medication Sig Dispense Refill  . acetaminophen (TYLENOL) 325 MG tablet Take 650 mg by mouth daily as needed for mild pain or headache.     . allopurinol (ZYLOPRIM) 300 MG tablet Take 300 mg by mouth daily.    Marland Kitchen amiodarone (PACERONE) 200 MG tablet Take 0.5 tablets (100 mg total) by mouth daily. 45 tablet 3  . atorvastatin (LIPITOR) 40 MG tablet Take 1 tablet (40 mg total) by mouth daily at 6 PM. 30 tablet 11  . colchicine 0.6 MG tablet Take 0.6 mg by mouth 2 (two) times daily as needed (gout).     Marland Kitchen ELIQUIS 5 MG TABS tablet Take 1 tablet by mouth 2 (two) times daily.    Marland Kitchen ezetimibe (ZETIA) 10 MG  tablet Take 10 mg by mouth daily.    Marland Kitchen FeFum-FePoly-FA-B Cmp-C-Biot (INTEGRA PLUS) CAPS Take 1 capsule by mouth every morning. 90 capsule 3  . fish oil-omega-3 fatty acids 1000 MG capsule Take 1 g by mouth daily.    . metoprolol tartrate (LOPRESSOR) 25 MG tablet Take 0.5 tablets (12.5 mg total) by mouth 2 (two) times daily. 90 tablet 3  . pantoprazole (PROTONIX) 40 MG tablet Take 1 tablet (40 mg total) by mouth daily. 30 tablet 0   No current facility-administered medications for this visit.    Allergies:   Patient has no known allergies.   Social History: The patient  reports that he quit smoking about 40 years ago. His smoking use included cigarettes. He has a 20.00 pack-year smoking history. He has never used smokeless tobacco. He reports that he does not drink alcohol or use drugs.   Family History: The patient's family history includes CVA (age of onset: 38) in his mother; Pneumonia (age of onset: 53) in his father.   ROS:  Please see the history of present illness. Otherwise, complete review of systems is positive for {NONE DEFAULTED:18576::"none"}.  All other systems are reviewed and negative.   Physical Exam: VS:  There were no vitals taken for this visit., BMI There is no height or weight on file to calculate BMI.  Wt Readings from Last 3 Encounters:  03/26/18 200 lb 12.8 oz (91.1 kg)  11/01/17 192 lb (87.1 kg)  09/18/17 191 lb 3.2 oz (86.7 kg)    General: Patient appears comfortable at rest. HEENT: Conjunctiva and lids normal, oropharynx clear with moist mucosa. Neck: Supple, no elevated JVP or carotid bruits, no thyromegaly. Lungs: Clear to auscultation, nonlabored breathing at rest. Cardiac: Regular rate and rhythm, no S3 or significant systolic murmur, no pericardial rub. Abdomen: Soft, nontender, no hepatomegaly, bowel sounds present, no guarding or rebound. Extremities: No pitting edema, distal pulses 2+. Skin: Warm and dry. Musculoskeletal: No kyphosis. Neuropsychiatric: Alert and oriented x3, affect grossly appropriate.  ECG: I personally reviewed the tracing from 08/16/2017 which showed sinus rhythm with prolonged PR interval and incomplete left bundle branch block.  Recent Labwork:    Component Value Date/Time   CHOL 178 09/11/2016 0252   TRIG 139 09/11/2016 0252   HDL 37 (L) 09/11/2016 0252   CHOLHDL 4.8 09/11/2016 0252   VLDL 28 09/11/2016 0252   LDLCALC 113 (H) 09/11/2016 0252  July 2019: Hemoglobin A1c 6.0, hemoglobin 9.7, platelets 295, BUN 21, creatinine  1.31, potassium 4.8, AST 26, ALT 19, cholesterol 115, triglycerides 104, HDL 38, LDL 56  Other Studies Reviewed Today:  Echocardiogram 08/17/2017: Study Conclusions  - Left ventricle: The cavity size was normal. Wall thickness was increased in a pattern of mild LVH. Systolic function was mildly reduced. The estimated ejection fraction was in the range of 45% to 50%. Diffuse hypokinesis. Features are consistent with a pseudonormal left ventricular filling pattern, with concomitant abnormal relaxation and increased filling pressure (grade 2 diastolic dysfunction). Doppler parameters are consistent with high ventricular filling pressure. - Aortic valve: Moderately calcified annulus. Trileaflet; moderately thickened leaflets. There was mild to moderate stenosis. There was mild regurgitation. Valve area (VTI): 1.35 cm^2. Valve area (Vmax): 1.45 cm^2. Valve area (Vmean): 1.15 cm^2. - Mitral valve: There was mild regurgitation. - Left atrium: The atrium was moderately dilated. - Right ventricle: The cavity size was mildly dilated. - Right atrium: The atrium was mildly dilated. - Atrial septum: No defect or patent foramen ovale was identified. -  Pulmonary arteries: Systolic pressure was moderately to severely increased. PA peak pressure: 67 mm Hg (S).  Carotid Dopplers 11/01/2017: Final Interpretation: Right Carotid: Velocities in the right ICA are consistent with a 1-39% stenosis.  Left         Patent left carotid endarterectomy site with no evidence of Carotid:     restenosis.                The ECA appears >50% stenosed.  Assessment and Plan:   Current medicines were reviewed with the patient today.  No orders of the defined types were placed in this encounter.   Disposition:  Signed, Satira Sark, MD, [facility] 09/22/2018 12:59 PM    [place] at [facility] [address] [place], [place] [address] Phone: [phone];  Fax: [phone]

## 2018-09-23 ENCOUNTER — Ambulatory Visit: Payer: Medicare Other | Admitting: Cardiology

## 2018-09-23 NOTE — Progress Notes (Deleted)
Cardiology Office Note  Date: 09/23/2018   ID: Phil, Michels 11/09/1928, MRN [id number]  PCP: [name], MD  Primary Cardiologist: [name], MD   No chief complaint on file.   History of Present Illness: Ronald Colon is an 85 y.o. male last seen in August 2019.  Past Medical History:  Diagnosis Date  . Aortic stenosis    Mild February 2018  . Arthritis   . Atrial fibrillation [facility])    Diagnosed January 2018  . AVM (arteriovenous malformation)   . Carotid stenosis   . Diverticulosis   . Essential hypertension, benign   . Gout   . Hx of adenomatous colonic polyps   . Hyperlipidemia   . IDA (iron deficiency anemia)   . Internal hemorrhoids   . Rheumatic fever 1937  . Stroke ([place]) 09/10/2016  . Tubular adenoma of colon     Past Surgical History:  Procedure Laterality Date  . CATARACT EXTRACTION W/PHACO Right 08/02/2014   Procedure: CATARACT EXTRACTION PHACO AND INTRAOCULAR LENS PLACEMENT; CDE:  12.77;  Surgeon: Williams Che, MD;  [facility];  Service: Ophthalmology;  Laterality: Right;  . CATARACT EXTRACTION W/PHACO Left 02/15/2015   Procedure: CATARACT EXTRACTION PHACO AND INTRAOCULAR LENS PLACEMENT (IOC);  Surgeon: Williams Che, MD;  [facility];  Service: Ophthalmology;  Laterality: Left;  CDE 18.00  . COLONOSCOPY    . COLONOSCOPY WITH PROPOFOL N/A 12/13/2016   Procedure: COLONOSCOPY WITH PROPOFOL;  Surgeon: Jerene Bears, MD;  [facility];  Service: Gastroenterology;  Laterality: N/A;  . ELBOW SURGERY Right    "grissle in there"  . ENDARTERECTOMY Left 09/17/2016   Procedure: LEFT CAROTID ENDARTECTOMY;  Surgeon: Waynetta Sandy, MD;  Location: [place];  Service: Vascular;  Laterality: Left;  . ESOPHAGEAL DILATION  09/03/2013   Procedure: ESOPHAGEAL DILATION;  Surgeon: Rogene Houston, MD;  [facility];  Service: Endoscopy;;  . ESOPHAGOGASTRODUODENOSCOPY N/A 09/03/2013   Procedure:  ESOPHAGOGASTRODUODENOSCOPY (EGD);  Surgeon: Rogene Houston, MD;  [facility];  Service: Endoscopy;  Laterality: N/A;  255-rescheduled to 1030 Ann to notify pt  . ESOPHAGOGASTRODUODENOSCOPY (EGD) WITH PROPOFOL N/A 12/13/2016   Procedure: ESOPHAGOGASTRODUODENOSCOPY (EGD) WITH PROPOFOL;  Surgeon: Jerene Bears, MD;  [facility];  Service: Gastroenterology;  Laterality: N/A;  . GIVENS CAPSULE STUDY N/A 08/17/2017   Procedure: GIVENS CAPSULE STUDY;  Surgeon: Danie Binder, MD;  [facility];  Service: Endoscopy;  Laterality: N/A;  . PATCH ANGIOPLASTY Left 09/17/2016   Procedure: [place] 1 CM X6 CM;  Surgeon: Waynetta Sandy, MD;  Location: [facility] OR;  Service: Vascular;  Laterality: Left;    Current Outpatient Medications  Medication Sig Dispense Refill  . acetaminophen (TYLENOL) 325 MG tablet Take 650 mg by mouth daily as needed for mild pain or headache.     . allopurinol (ZYLOPRIM) 300 MG tablet Take 300 mg by mouth daily.    Marland Kitchen amiodarone (PACERONE) 200 MG tablet Take 0.5 tablets (100 mg total) by mouth daily. 45 tablet 3  . atorvastatin (LIPITOR) 40 MG tablet Take 1 tablet (40 mg total) by mouth daily at 6 PM. 30 tablet 11  . colchicine 0.6 MG tablet Take 0.6 mg by mouth 2 (two) times daily as needed (gout).     Marland Kitchen ELIQUIS 5 MG TABS tablet Take 1 tablet by mouth 2 (two) times daily.    Marland Kitchen ezetimibe (ZETIA) 10 MG  tablet Take 10 mg by mouth daily.    Marland Kitchen FeFum-FePoly-FA-B Cmp-C-Biot (INTEGRA PLUS) CAPS Take 1 capsule by mouth every morning. 90 capsule 3  . fish oil-omega-3 fatty acids 1000 MG capsule Take 1 g by mouth daily.    . metoprolol tartrate (LOPRESSOR) 25 MG tablet Take 0.5 tablets (12.5 mg total) by mouth 2 (two) times daily. 90 tablet 3  . pantoprazole (PROTONIX) 40 MG tablet Take 1 tablet (40 mg total) by mouth daily. 30 tablet 0   No current facility-administered medications for this visit.    Allergies:   Patient has no known allergies.   Social History: The patient  reports that he quit smoking about 40 years ago. His smoking use included cigarettes. He has a 20.00 pack-year smoking history. He has never used smokeless tobacco. He reports that he does not drink alcohol or use drugs.   Family History: The patient's family history includes CVA (age of onset: 77) in his mother; Pneumonia (age of onset: 4) in his father.   ROS:  Please see the history of present illness. Otherwise, complete review of systems is positive for {NONE DEFAULTED:18576::"none"}.  All other systems are reviewed and negative.   Physical Exam: VS:  There were no vitals taken for this visit., BMI There is no height or weight on file to calculate BMI.  Wt Readings from Last 3 Encounters:  03/26/18 200 lb 12.8 oz (91.1 kg)  11/01/17 192 lb (87.1 kg)  09/18/17 191 lb 3.2 oz (86.7 kg)    General: Patient appears comfortable at rest. HEENT: Conjunctiva and lids normal, oropharynx clear with moist mucosa. Neck: Supple, no elevated JVP or carotid bruits, no thyromegaly. Lungs: Clear to auscultation, nonlabored breathing at rest. Cardiac: Regular rate and rhythm, no S3 or significant systolic murmur, no pericardial rub. Abdomen: Soft, nontender, no hepatomegaly, bowel sounds present, no guarding or rebound. Extremities: No pitting edema, distal pulses 2+. Skin: Warm and dry. Musculoskeletal: No kyphosis. Neuropsychiatric: Alert and oriented x3, affect grossly appropriate.  ECG: I personally reviewed the tracing from 08/16/2017 which showed sinus rhythm with prolonged PR interval and incomplete left bundle branch block.  Recent Labwork:    Component Value Date/Time   CHOL 178 09/11/2016 0252   TRIG 139 09/11/2016 0252   HDL 37 (L) 09/11/2016 0252   CHOLHDL 4.8 09/11/2016 0252   VLDL 28 09/11/2016 0252   LDLCALC 113 (H) 09/11/2016 0252  July 2019: Hemoglobin A1c 6.0, hemoglobin 9.7, platelets 295, BUN 21, creatinine  1.31, potassium 4.8, AST 26, ALT 19, cholesterol 115, triglycerides 104, HDL 38, LDL 56  Other Studies Reviewed Today:  Echocardiogram 08/17/2017: Study Conclusions  - Left ventricle: The cavity size was normal. Wall thickness was increased in a pattern of mild LVH. Systolic function was mildly reduced. The estimated ejection fraction was in the range of 45% to 50%. Diffuse hypokinesis. Features are consistent with a pseudonormal left ventricular filling pattern, with concomitant abnormal relaxation and increased filling pressure (grade 2 diastolic dysfunction). Doppler parameters are consistent with high ventricular filling pressure. - Aortic valve: Moderately calcified annulus. Trileaflet; moderately thickened leaflets. There was mild to moderate stenosis. There was mild regurgitation. Valve area (VTI): 1.35 cm^2. Valve area (Vmax): 1.45 cm^2. Valve area (Vmean): 1.15 cm^2. - Mitral valve: There was mild regurgitation. - Left atrium: The atrium was moderately dilated. - Right ventricle: The cavity size was mildly dilated. - Right atrium: The atrium was mildly dilated. - Atrial septum: No defect or patent foramen ovale was identified. -  Pulmonary arteries: Systolic pressure was moderately to severely increased. PA peak pressure: 67 mm Hg (S).  Carotid Dopplers 11/01/2017: Final Interpretation: Right Carotid: Velocities in the right ICA are consistent with a 1-39% stenosis.  Left         Patent left carotid endarterectomy site with no evidence of Carotid:     restenosis.                The ECA appears >50% stenosed.  Assessment and Plan:   Current medicines were reviewed with the patient today.  No orders of the defined types were placed in this encounter.   Disposition:  Signed, Satira Sark, MD, [facility] 09/23/2018 10:45 AM    [place] at [place]. [address], [place], [place] [address] Phone: [phone];  Fax: [phone]

## 2018-09-29 ENCOUNTER — Other Ambulatory Visit (HOSPITAL_COMMUNITY): Payer: Self-pay | Admitting: Family Medicine

## 2018-09-29 DIAGNOSIS — G8929 Other chronic pain: Secondary | ICD-10-CM

## 2018-09-29 DIAGNOSIS — M5416 Radiculopathy, lumbar region: Secondary | ICD-10-CM

## 2018-09-29 DIAGNOSIS — M545 Low back pain: Principal | ICD-10-CM

## 2018-10-01 ENCOUNTER — Telehealth: Payer: Self-pay | Admitting: Cardiology

## 2018-10-01 NOTE — Telephone Encounter (Signed)
Pt is having cavity filling done a 2 today. Pt notified he does not need to hold Eliquis

## 2018-10-01 NOTE — Telephone Encounter (Signed)
Pt is having dental work and needs to know if he needs to hold his ELIQUIS 5 MG TABS tablet [[id number]] , his apt is this afternoon.  Please call his son Thierry Dobosz @ [phone]

## 2018-10-14 ENCOUNTER — Ambulatory Visit (HOSPITAL_COMMUNITY)
Admission: RE | Admit: 2018-10-14 | Discharge: 2018-10-14 | Disposition: A | Discharge status: Home / Self Care | Payer: Medicare Other | Source: Ambulatory Visit | Attending: Family Medicine | Admitting: Family Medicine

## 2018-10-14 DIAGNOSIS — M545 Low back pain: Secondary | ICD-10-CM | POA: Diagnosis not present

## 2018-10-14 DIAGNOSIS — G8929 Other chronic pain: Secondary | ICD-10-CM | POA: Diagnosis not present

## 2018-10-14 DIAGNOSIS — M5416 Radiculopathy, lumbar region: Secondary | ICD-10-CM | POA: Diagnosis not present

## 2018-10-15 ENCOUNTER — Other Ambulatory Visit: Payer: Self-pay | Admitting: Physician Assistant

## 2018-10-17 ENCOUNTER — Ambulatory Visit: Payer: Medicare Other | Admitting: Cardiology

## 2018-10-23 ENCOUNTER — Telehealth: Payer: Self-pay | Admitting: Cardiology

## 2018-10-23 NOTE — Telephone Encounter (Signed)
Pt is having a shot in his back at [facility] on Monday and his son is wanting to know if he needs to hold his ELIQUIS 5 MG TABS tablet [[id number]] for anytime before or after the procedure.   Please give pt's son Christia Reading a call [phone]

## 2018-10-23 NOTE — Telephone Encounter (Signed)
Spoke with [place] and informed that clearance letter received in Feb states that pt is to hold Eliquis for 2 days prior to injection.  Son notified and voiced understanding.

## 2018-10-23 NOTE — Telephone Encounter (Signed)
Son calling to verify that it is ok to stop Eliquis before pain injection to be done on Monday. He reports that his father had same injection done in December of 2019.  Called Wells Guiles at [place] for more information.

## 2018-10-25 ENCOUNTER — Other Ambulatory Visit: Payer: Self-pay | Admitting: Physician Assistant

## 2018-10-27 ENCOUNTER — Other Ambulatory Visit: Payer: Self-pay

## 2018-10-27 ENCOUNTER — Ambulatory Visit
Admission: RE | Admit: 2018-10-27 | Discharge: 2018-10-27 | Disposition: A | Discharge status: Home / Self Care | Payer: Medicare Other | Source: Ambulatory Visit | Attending: Family Medicine | Admitting: Family Medicine

## 2018-10-27 DIAGNOSIS — M47817 Spondylosis without myelopathy or radiculopathy, lumbosacral region: Secondary | ICD-10-CM | POA: Diagnosis not present

## 2018-10-27 DIAGNOSIS — G8929 Other chronic pain: Secondary | ICD-10-CM

## 2018-10-27 DIAGNOSIS — M545 Low back pain: Principal | ICD-10-CM

## 2018-10-27 MED ORDER — METHYLPREDNISOLONE ACETATE 40 MG/ML INJ SUSP (RADIOLOG
120.0000 mg | Freq: Once | INTRAMUSCULAR | Status: AC
  Administered 2018-10-27: 120 mg via EPIDURAL

## 2018-10-27 MED ORDER — IOPAMIDOL (ISOVUE-M 200) INJECTION 41%
1.0000 mL | Freq: Once | INTRAMUSCULAR | Status: AC
  Administered 2018-10-27: 1 mL via EPIDURAL

## 2018-10-27 NOTE — Discharge Instructions (Signed)

## 2018-10-29 ENCOUNTER — Other Ambulatory Visit: Payer: Self-pay | Admitting: Physician Assistant

## 2018-10-29 NOTE — Telephone Encounter (Signed)
Per Dr Hilarie Fredrickson, patient may have refill on iron tablet.

## 2018-11-02 IMAGING — MR MR MRA HEAD W/O CM
1 series · 15 of 48 positions shown · non-contrast
Comparison: Head CT 09/10/2016

CLINICAL DATA: Right upper extremity numbness and weakness.
Aphasia.

EXAM:
MRI HEAD WITHOUT CONTRAST
MRA HEAD WITHOUT CONTRAST
TECHNIQUE: Multiplanar, multiecho pulse sequences of the brain and surrounding
structures were obtained without intravenous contrast. Angiographic
images of the head were obtained using MRA technique without
contrast.

[Series 2: MRA · axial · 0.8mm · 0.38mm/px · z∈[-87,+10]mm · 15 of 131 slices shown]
[im 1/131]
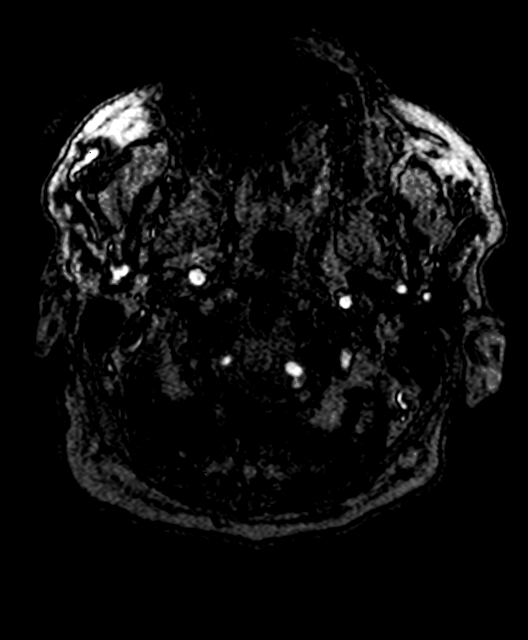
[im 3/131]
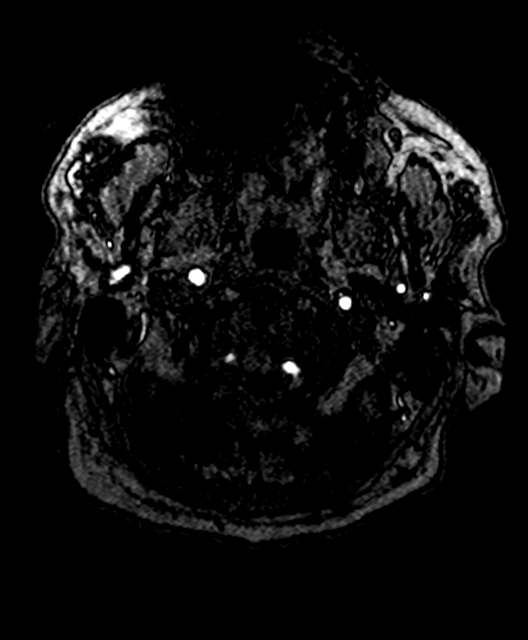
[im 6/131]
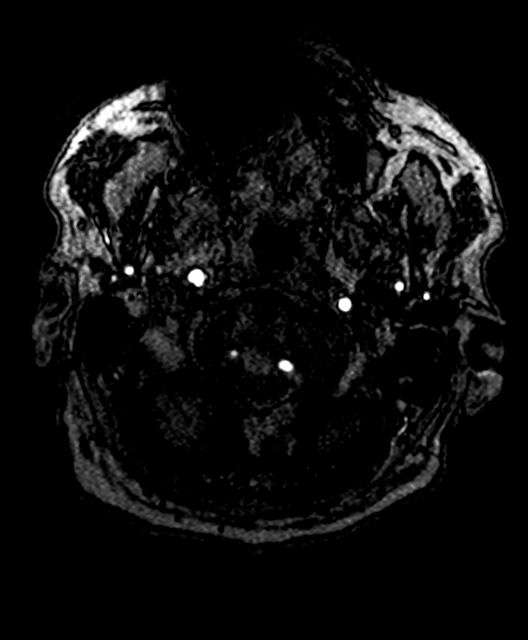
[im 9/131]
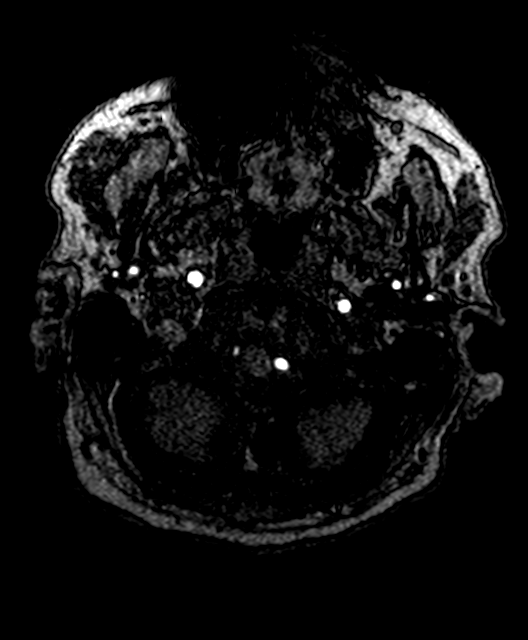
[im 12/131]
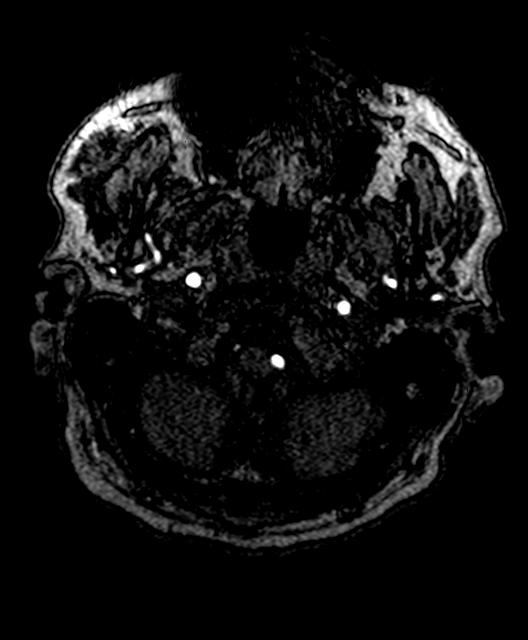
[im 23/131]
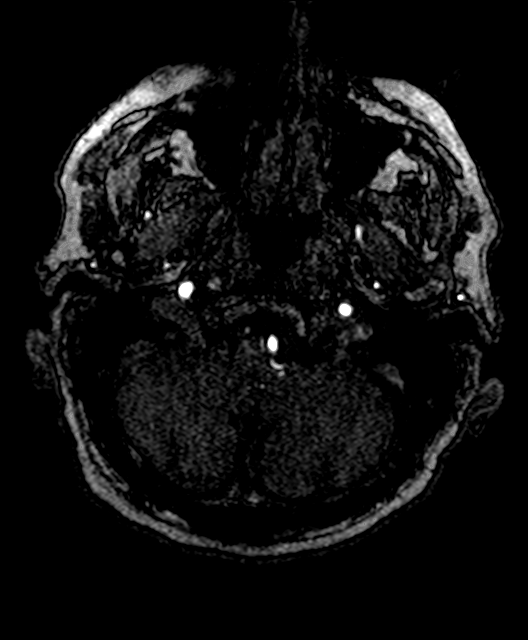
[im 25/131]
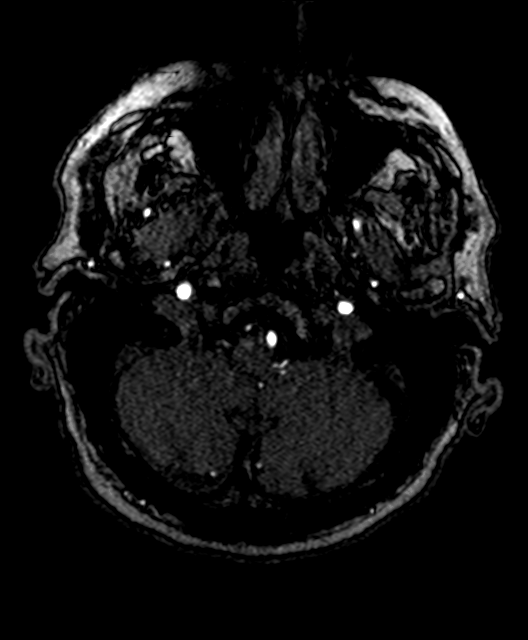
[im 42/131]
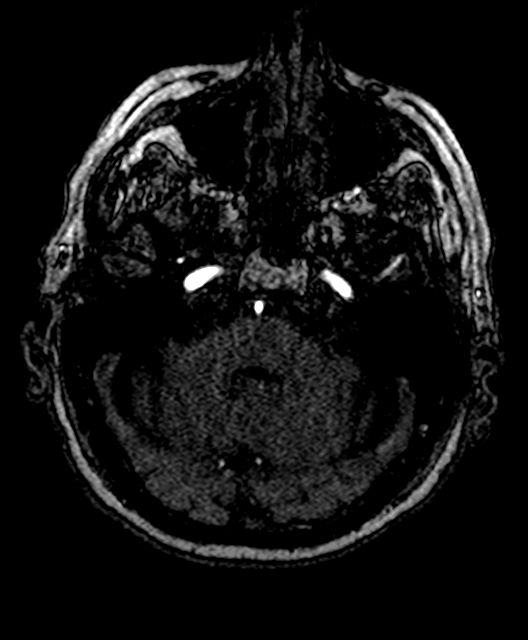
[im 59/131]
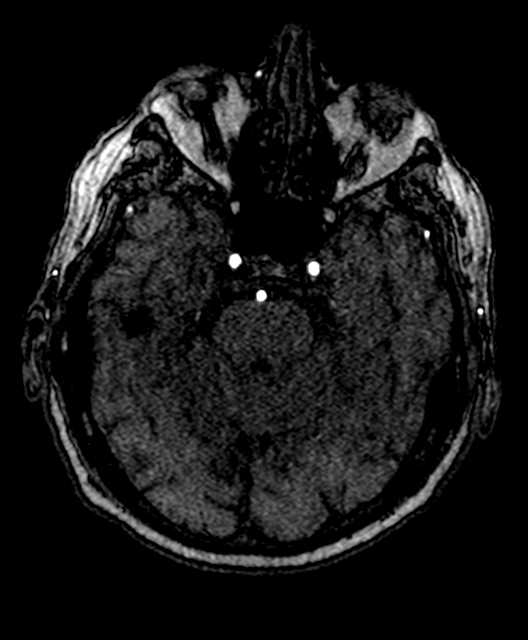
[im 67/131]
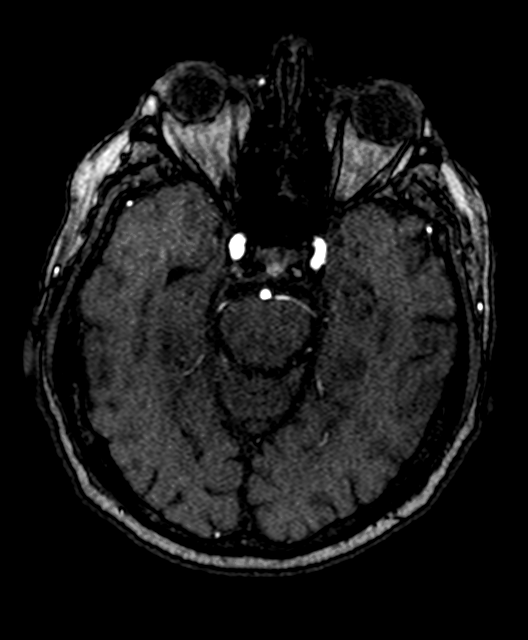
[im 75/131]
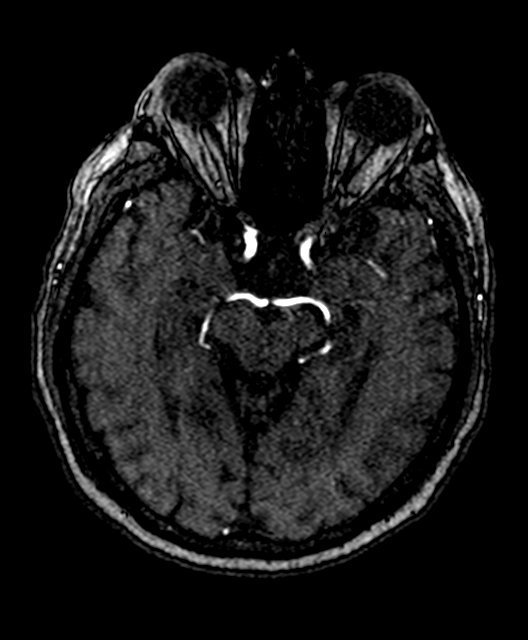
[im 92/131]
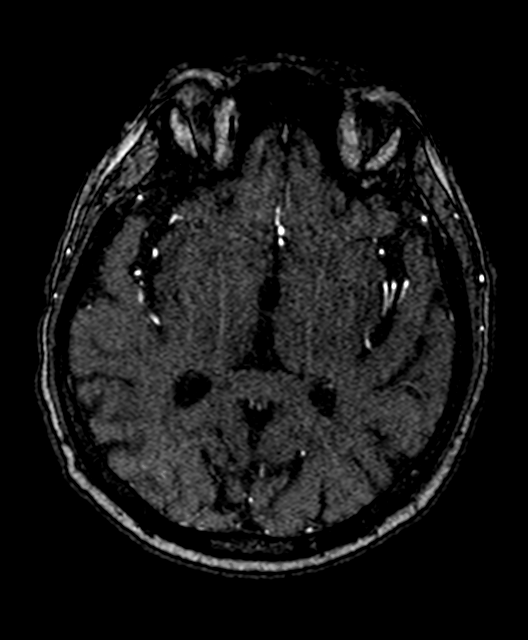
[im 108/131]
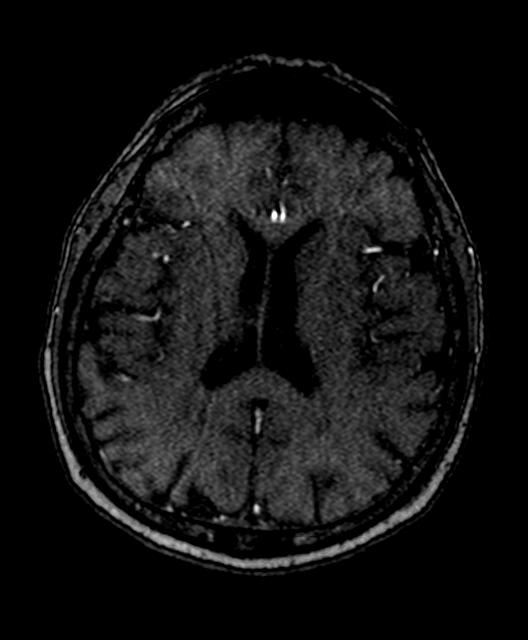
[im 111/131]
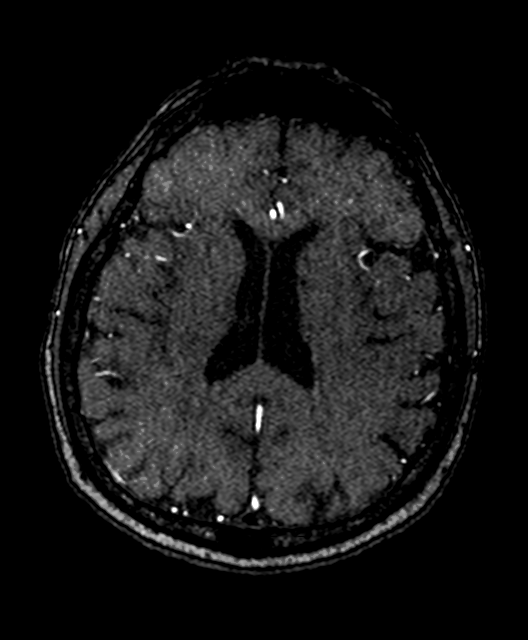
[im 125/131]
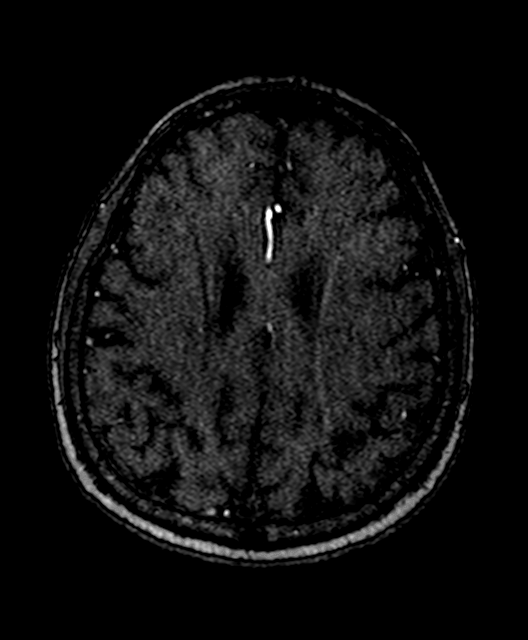

[15 of 48 positions shown; findings below may reference images not displayed]

FINDINGS: MRI HEAD FINDINGS

Brain: There are small foci of patchy acute cortical and white
matter infarction throughout the left cerebral hemisphere. The
parietal lobe is involved to the greatest extent, however there is
also involvement of frontal lobe cortex at the vertex, centrum
semiovale, posterior and lateral temporal lobe, and lateral
occipital lobe. There is also a punctate acute infarct in the
posterior aspect of the external capsule. There is no evidence of
intracranial hemorrhage, mass, midline shift, or extra-axial fluid
collection. Mild generalized cerebral atrophy is within normal
limits for age. Patchy T2 hyperintensities in the cerebral white
matter bilaterally are nonspecific but compatible with
mild-to-moderate chronic small vessel ischemic disease. There is a
chronic lacunar infarct in the body of the left caudate nucleus.

Vascular: Major intracranial vascular flow voids are preserved.

Skull and upper cervical spine: Unremarkable bone marrow signal.

Sinuses/Orbits: Prior bilateral cataract extraction. Paranasal
sinuses and mastoid air cells are clear.

Other: None.

MRA HEAD FINDINGS

The visualized distal left vertebral artery is widely patent and
strongly dominant. The distal right vertebral artery is hypoplastic
with superimposed tandem severe stenoses. The left PICA, right AICA,
and bilateral SCA origins are patent. Basilar artery is widely
patent. PCAs are patent with a moderate proximal right P2 stenosis
noted.

The internal carotid arteries are widely patent from skullbase to
carotid termini. The right A1 segment is slightly dominant. There is
a 4 x 3 mm superiorly directed outpouching from the proximal to mid
left M1 segment with appearance most compatible with an aneurysm
rather than occluded branch vessel. The left M1 segment is widely
patent, as is what appears to be a normal left MCA bifurcation.
There does not appear to be a decreased number of left MCA branch
vessels compared to the contralateral side.
IMPRESSION: 1. Patchy acute infarcts throughout the left cerebral hemisphere
which may reflect emboli or hypoperfusion.
2. Mild-to-moderate chronic small vessel ischemic disease.
3. No intracranial large vessel occlusion.
4. Heavily diseased and hypoplastic distal right vertebral artery.
The dominant distal left vertebral artery is widely patent.
5. Moderate proximal right P2 stenosis.
6. 4 x 3 mm proximal left MCA aneurysm.

## 2018-11-18 ENCOUNTER — Ambulatory Visit: Payer: Medicare Other | Admitting: Cardiology

## 2018-11-20 ENCOUNTER — Ambulatory Visit: Payer: Medicare Other | Admitting: Family

## 2018-12-24 ENCOUNTER — Other Ambulatory Visit: Payer: Self-pay | Admitting: Family Medicine

## 2018-12-24 ENCOUNTER — Other Ambulatory Visit (HOSPITAL_COMMUNITY): Payer: Self-pay | Admitting: Family Medicine

## 2018-12-24 DIAGNOSIS — I714 Abdominal aortic aneurysm, without rupture, unspecified: Secondary | ICD-10-CM

## 2019-01-21 ENCOUNTER — Other Ambulatory Visit: Payer: Self-pay | Admitting: Internal Medicine

## 2019-02-07 ENCOUNTER — Other Ambulatory Visit: Payer: Self-pay | Admitting: Internal Medicine

## 2019-02-12 ENCOUNTER — Other Ambulatory Visit: Payer: Self-pay | Admitting: Internal Medicine

## 2019-02-12 DIAGNOSIS — R600 Localized edema: Secondary | ICD-10-CM | POA: Diagnosis not present

## 2019-02-20 DIAGNOSIS — D509 Iron deficiency anemia, unspecified: Secondary | ICD-10-CM | POA: Diagnosis not present

## 2019-02-20 DIAGNOSIS — R718 Other abnormality of red blood cells: Secondary | ICD-10-CM | POA: Diagnosis not present

## 2019-02-20 DIAGNOSIS — E119 Type 2 diabetes mellitus without complications: Secondary | ICD-10-CM | POA: Diagnosis not present

## 2019-02-24 ENCOUNTER — Telehealth: Payer: Self-pay | Admitting: Cardiology

## 2019-02-24 MED ORDER — APIXABAN 2.5 MG PO TABS: 2.5000 mg | ORAL_TABLET | Freq: Two times a day (BID) | ORAL | 3 refills | Status: DC

## 2019-02-24 NOTE — Telephone Encounter (Signed)
    In looking at side-effect profiles, Eliquis typically has a lower bleeding risk when compared to Coumadin or Xarelto. Dosing is based off age, weight, and renal function so please make sure Ronald Colon sends [place] his most recent BMET. Confirm he is not taking ASA or NSAIDS.   Suspect they will be obtaining a stool card and anemia panel as these would need to be obtained. If requiring GI workup and further evaluation given known AVM's and internal hemorrhoids, may need to hold Eliquis. This would increase the risk of a CVA or TE in the interim, but would need to continue to examine risks and benefits of anticoagulation.    Signed, Erma Heritage, PA-C 02/24/2019, 10:41 AM Pager: [phone]

## 2019-02-24 NOTE — Telephone Encounter (Signed)
Pt called wanting to know if there's anything else he can take other than Eliquis because he's bleeding and his hemoglobin is low.

## 2019-02-24 NOTE — Telephone Encounter (Addendum)
    Recent labs reviewed. Creatinine now at 1.65. Given age greater than 26 and creatinine greater than 1.5, would recommend reducing Eliquis to 2.5mg  BID. He still needs further workup of his anemia by his PCP and possible GI referral pending their assessment.   Signed, Erma Heritage, PA-C 02/24/2019, 11:48 AM Pager: [phone]

## 2019-02-24 NOTE — Telephone Encounter (Signed)
Pt said Aldona Bar at [facility] told him his recent lab work showed Hgb went from 11 to "8 something".He denies any bleeding from rectum and takes iron daily. I have requested labs from pcp

## 2019-02-24 NOTE — Telephone Encounter (Signed)
I spoke with son Ronald Colon and patient.He will decrease Eliquis to 2.5 mg BID.Son requested 90 day supply (cheaper with insurance)

## 2019-02-27 DIAGNOSIS — N390 Urinary tract infection, site not specified: Secondary | ICD-10-CM | POA: Diagnosis not present

## 2019-03-06 ENCOUNTER — Other Ambulatory Visit: Payer: Self-pay | Admitting: Vascular Surgery

## 2019-03-28 DIAGNOSIS — Z1211 Encounter for screening for malignant neoplasm of colon: Secondary | ICD-10-CM | POA: Diagnosis not present

## 2019-04-17 DIAGNOSIS — N183 Chronic kidney disease, stage 3 (moderate): Secondary | ICD-10-CM | POA: Diagnosis not present

## 2019-04-28 ENCOUNTER — Telehealth: Payer: Self-pay | Admitting: Cardiology

## 2019-04-28 DIAGNOSIS — D649 Anemia, unspecified: Secondary | ICD-10-CM | POA: Diagnosis not present

## 2019-04-28 DIAGNOSIS — E119 Type 2 diabetes mellitus without complications: Secondary | ICD-10-CM | POA: Diagnosis not present

## 2019-04-28 DIAGNOSIS — R7309 Other abnormal glucose: Secondary | ICD-10-CM | POA: Diagnosis not present

## 2019-04-28 DIAGNOSIS — R710 Precipitous drop in hematocrit: Secondary | ICD-10-CM | POA: Diagnosis not present

## 2019-04-28 DIAGNOSIS — N179 Acute kidney failure, unspecified: Secondary | ICD-10-CM | POA: Diagnosis not present

## 2019-04-28 MED ORDER — RIVAROXABAN 15 MG PO TABS: 15.0000 mg | ORAL_TABLET | Freq: Every day | ORAL | 3 refills | Status: DC

## 2019-04-28 NOTE — Telephone Encounter (Signed)
Pt would like to try Xarelto.  Pt asked this nurse to call his son to give him instruction.  Call placed to son and discussed changing to Xarelto.  Advised to have Pt take last dose of Eliquis in the AM.  Then take Xarelto that night with largest meal. Then Pt will discontinue Eliquis and take Xarelto daily with largest meal.  Son indicates understanding.

## 2019-04-28 NOTE — Telephone Encounter (Signed)
We have discussed this issue before, and I am honestly not certain whether his bleeding risk is going to be any less on Xarelto versus Eliquis.  He has a history of GI bleed with previous resection of polyps and ablation of angiodysplastic lesion.  He has internal hemorrhoids and AVMs.  If he wants to try switching from Eliquis to Xarelto, he would need to go with the 15 mg daily dose with creatinine clearance of 40.

## 2019-04-28 NOTE — Telephone Encounter (Signed)
Call returned to Pt.  Pt has 4-5 loose bowel movements a day.   He states he has diverticulitis and being on Eliquis causes him to have bleeding.  He has been seen by GI doctor and has had upper and lower GI work up.  Pt has only tried Eliquis for anticoagulation.  Pt would be interested in trying Xarelto to see if he can tolerate it better.  Will forward to Dr. Domenic Polite for advisement.

## 2019-04-28 NOTE — Telephone Encounter (Signed)
Patient states that he is taking Eliquis 2.5mg  and had a positive hemoccult w/ PCP. Patient states that he has loose stools for some time. Would like to speak with nurse regarding above concerns. / tg

## 2019-04-30 ENCOUNTER — Telehealth: Payer: Self-pay | Admitting: Cardiology

## 2019-04-30 NOTE — Telephone Encounter (Signed)
Would like to speak with someone about medication messing up his bowel movements

## 2019-04-30 NOTE — Telephone Encounter (Signed)
Having to go to the bathroom 4-5 x day (small amount at a time) - not sure which medication may be causing loose stools.  Says he is still taking the Eliquis 2.5mg  twice a day.  Has not changed to the Xarelto yet.  States that he started his Lasix & Eliqis around the same time & not sure which medication may be causing this.  Would like to get bowels straight before switching to the Xarelto.

## 2019-05-04 ENCOUNTER — Telehealth: Payer: Self-pay | Admitting: Cardiology

## 2019-05-04 NOTE — Telephone Encounter (Signed)
Having to go to the bathroom 4-5 x day (small amount at a time) - not sure which medication may be causing loose stools.  Says he is still taking the Eliquis 2.5mg  twice a day.  Has not changed to the Xarelto yet.  States that he started his Lasix & Eliqis around the same time & not sure which medication may be causing this.  Would like to get bowels straight before switching to the Xarelto.

## 2019-05-04 NOTE — Telephone Encounter (Signed)
Please call patient in regards to previous phone note from 9/17. / tg

## 2019-05-04 NOTE — Telephone Encounter (Signed)
Patient wants to know if you have any suggestions or advice for him in regards to complaint below.

## 2019-05-05 ENCOUNTER — Telehealth: Payer: Self-pay | Admitting: Cardiology

## 2019-05-05 NOTE — Telephone Encounter (Signed)
Returned pt call. No answer. Left msg to call back.  

## 2019-05-05 NOTE — Telephone Encounter (Signed)
Pt called stating his PCP took him off his fluid pill

## 2019-05-05 NOTE — Telephone Encounter (Signed)
It looks like he may have been placed on Lasix by his PCP.  I do not see it on our last office visit note.  Although not particularly common, diarrhea is a potential side effect of Lasix.  Eliquis on the other hand does not typically cause diarrhea.  He could consider holding the Lasix to see if that made a difference, but he should also let his PCP know about symptoms.

## 2019-05-05 NOTE — Telephone Encounter (Signed)
Patient & son Ronald Colon) notified.  They will discuss with pmd & let us know the outcome.  Reminded that he is overdue for follow up.  He will have his dad make appointment when he calls back to give [place] update.

## 2019-05-06 NOTE — Telephone Encounter (Signed)
Called pt. No answer. Left message for me to return call.

## 2019-05-08 ENCOUNTER — Telehealth: Payer: Self-pay | Admitting: Cardiology

## 2019-05-08 DIAGNOSIS — Z23 Encounter for immunization: Secondary | ICD-10-CM | POA: Diagnosis not present

## 2019-05-08 NOTE — Telephone Encounter (Signed)

## 2019-05-08 NOTE — Telephone Encounter (Signed)
Patient states he was having abdominal pain and pcp took him off his lasix.He now says he urinates only once a day and is concerned if he needs additional medication. Denies SOB, leg swelling.Virtual phone visit scheduled for 05/11/2019 with Dr.McDowell , consent obtained      Virtual Visit Pre-Appointment Phone Call  "(Name), I am calling you today to discuss your upcoming appointment. We are currently trying to limit exposure to the virus that causes COVID-19 by seeing patients at home rather than in the office."  1. "What is the BEST phone number to call the day of the visit?" - include this in appointment notes  2. "Do you have or have access to (through a family member/friend) a smartphone with video capability that we can use for your visit?" a. If yes - list this number in appt notes as "cell" (if different from BEST phone #) and list the appointment type as a VIDEO visit in appointment notes b. If no - list the appointment type as a PHONE visit in appointment notes  3. Confirm consent - "In the setting of the current Covid19 crisis, you are scheduled for a (phone or video) visit with your provider on (date) at (time).  Just as we do with many in-office visits, in order for you to participate in this visit, we must obtain consent.  If you'd like, I can send this to your mychart (if signed up) or email for you to review.  Otherwise, I can obtain your verbal consent now.  All virtual visits are billed to your insurance company just like a normal visit would be.  By agreeing to a virtual visit, we'd like you to understand that the technology does not allow for your provider to perform an examination, and thus may limit your provider's ability to fully assess your condition. If your provider identifies any concerns that need to be evaluated in person, we will make arrangements to do so.  Finally, though the technology is pretty good, we cannot assure that it will always work on either your or our  end, and in the setting of a video visit, we may have to convert it to a phone-only visit.  In either situation, we cannot ensure that we have a secure connection.  Are you willing to proceed?" STAFF: Did the patient verbally acknowledge consent to telehealth visit? Document YES/NO here: YES  4. Advise patient to be prepared - "Two hours prior to your appointment, go ahead and check your blood pressure, pulse, oxygen saturation, and your weight (if you have the equipment to check those) and write them all down. When your visit starts, your provider will ask you for this information. If you have an Apple Watch or Kardia device, please plan to have heart rate information ready on the day of your appointment. Please have a pen and paper handy nearby the day of the visit as well."  5. Give patient instructions for MyChart download to smartphone OR Doximity/Doxy.me as below if video visit (depending on what platform provider is using)  6. Inform patient they will receive a phone call 15 minutes prior to their appointment time (may be from unknown caller ID) so they should be prepared to answer    TELEPHONE CALL NOTE  Ronald Colon has been deemed a candidate for a follow-up tele-health visit to limit community exposure during the Covid-19 pandemic. I spoke with the patient via phone to ensure availability of phone/video source, confirm preferred email & phone number, and  discuss instructions and expectations.  I reminded Ronald Colon to be prepared with any vital sign and/or heart rhythm information that could potentially be obtained via home monitoring, at the time of his visit. I reminded Ronald Colon to expect a phone call prior to his visit.  Bernita Raisin, RN 05/08/2019 1:51 PM   INSTRUCTIONS FOR DOWNLOADING THE MYCHART APP TO SMARTPHONE  - The patient must first make sure to have activated MyChart and know their login information - If Apple, go to [employer] and type in MyChart in the  search bar and download the app. If Android, ask patient to go to [employer] and type in [place] in the search bar and download the app. The app is free but as with any other app downloads, their phone may require them to verify saved payment information or Apple/Android password.  - The patient will need to then log into the app with their MyChart username and password, and select  as their healthcare provider to link the account. When it is time for your visit, go to the MyChart app, find appointments, and click Begin Video Visit. Be sure to Select Allow for your device to access the Microphone and Camera for your visit. You will then be connected, and your provider will be with you shortly.  **If they have any issues connecting, or need assistance please contact MyChart service desk [phone])**  **If using a computer, in order to ensure the best quality for their visit they will need to use either of the following Internet Browsers: [employer], or Google Chrome**  IF USING DOXIMITY or DOXY.ME - The patient will receive a link just prior to their visit by text.     FULL LENGTH CONSENT FOR TELE-HEALTH VISIT   I hereby voluntarily request, consent and authorize [facility] and its employed or contracted physicians, physician assistants, nurse practitioners or other licensed health care professionals (the Practitioner), to provide me with telemedicine health care services (the "Services") as deemed necessary by the treating Practitioner. I acknowledge and consent to receive the Services by the Practitioner via telemedicine. I understand that the telemedicine visit will involve communicating with the Practitioner through live audiovisual communication technology and the disclosure of certain medical information by electronic transmission. I acknowledge that I have been given the opportunity to request an in-person assessment or other available alternative prior  to the telemedicine visit and am voluntarily participating in the telemedicine visit.  I understand that I have the right to withhold or withdraw my consent to the use of telemedicine in the course of my care at any time, without affecting my right to future care or treatment, and that the Practitioner or I may terminate the telemedicine visit at any time. I understand that I have the right to inspect all information obtained and/or recorded in the course of the telemedicine visit and may receive copies of available information for a reasonable fee.  I understand that some of the potential risks of receiving the Services via telemedicine include:  Marland Kitchen Delay or interruption in medical evaluation due to technological equipment failure or disruption; . Information transmitted may not be sufficient (e.g. poor resolution of images) to allow for appropriate medical decision making by the Practitioner; and/or  . In rare instances, security protocols could fail, causing a breach of personal health information.  Furthermore, I acknowledge that it is my responsibility to provide information about my medical history, conditions and care that is complete  and accurate to the best of my ability. I acknowledge that Practitioner's advice, recommendations, and/or decision may be based on factors not within their control, such as incomplete or inaccurate data provided by me or distortions of diagnostic images or specimens that may result from electronic transmissions. I understand that the practice of medicine is not an exact science and that Practitioner makes no warranties or guarantees regarding treatment outcomes. I acknowledge that I will receive a copy of this consent concurrently upon execution via email to the email address I last provided but may also request a printed copy by calling the office of [facility].    I understand that my insurance will be billed for this visit.   I have read or had this consent read  to me. . I understand the contents of this consent, which adequately explains the benefits and risks of the Services being provided via telemedicine.  . I have been provided ample opportunity to ask questions regarding this consent and the Services and have had my questions answered to my satisfaction. . I give my informed consent for the services to be provided through the use of telemedicine in my medical care  By participating in this telemedicine visit I agree to the above.

## 2019-05-11 ENCOUNTER — Encounter: Payer: Self-pay | Admitting: Cardiology

## 2019-05-11 ENCOUNTER — Other Ambulatory Visit: Payer: Self-pay

## 2019-05-11 ENCOUNTER — Telehealth (INDEPENDENT_AMBULATORY_CARE_PROVIDER_SITE_OTHER): Payer: Medicare Other | Admitting: Cardiology

## 2019-05-11 VITALS — Ht 74.0 in | Wt 218.0 lb

## 2019-05-11 DIAGNOSIS — Z8719 Personal history of other diseases of the digestive system: Secondary | ICD-10-CM

## 2019-05-11 DIAGNOSIS — I6522 Occlusion and stenosis of left carotid artery: Secondary | ICD-10-CM

## 2019-05-11 DIAGNOSIS — I6523 Occlusion and stenosis of bilateral carotid arteries: Secondary | ICD-10-CM | POA: Diagnosis not present

## 2019-05-11 DIAGNOSIS — K5520 Angiodysplasia of colon without hemorrhage: Secondary | ICD-10-CM

## 2019-05-11 DIAGNOSIS — I480 Paroxysmal atrial fibrillation: Secondary | ICD-10-CM

## 2019-05-11 DIAGNOSIS — Z8673 Personal history of transient ischemic attack (TIA), and cerebral infarction without residual deficits: Secondary | ICD-10-CM

## 2019-05-11 DIAGNOSIS — I350 Nonrheumatic aortic (valve) stenosis: Secondary | ICD-10-CM

## 2019-05-11 NOTE — Progress Notes (Signed)
Virtual Visit via Telephone Note   This visit type was conducted due to national recommendations for restrictions regarding the COVID-19 Pandemic (e.g. social distancing) in an effort to limit this patient's exposure and mitigate transmission in our community.  Due to his co-morbid illnesses, this patient is at least at moderate risk for complications without adequate follow up.  This format is felt to be most appropriate for this patient at this time.  The patient did not have access to video technology/had technical difficulties with video requiring transitioning to audio format only (telephone).  All issues noted in this document were discussed and addressed.  No physical exam could be performed with this format.  Please refer to the patient's chart for his  consent to telehealth for [facility].   Date:  05/11/2019   ID:  Ronald Colon, DOB 1928-09-12, MRN [id number]  Patient Location: Home Provider Location: Office  PCP:  [name], MD  Cardiologist:  [name], MD Electrophysiologist:  None  Evaluation Performed:  Follow-Up Visit  Chief Complaint:   Cardiac follow-up  History of Present Illness:    Ronald Colon is an 85 y.o. male last seen in August 2019.  We spoke by phone today.  He states that he feels "okay."  No palpitations or chest pain.  Telephone notes reviewed, he has been having trouble with diarrhea, was taken off Lasix most recently.  He has continued on Eliquis.  He still has intermittent heme positive stools with history of GI bleed.  Hemoglobin had gotten down to 8.6 but most recently back up to 10.4 by his report. I reviewed his lab work from July as outlined below.  There was some discussion about switching from Eliquis to Xarelto, he was given a prescription by his PCP but had not made the switch as yet.  Honestly, it is not clear to me that he is going to have much of a decreased GI bleeding risk on Xarelto versus Eliquis, the bigger decision will be  whether we just need to stop anticoagulation altogether   I reviewed his medications which are outlined below.  Patient's son continues to assist him with medications regularly.  The patient does not have symptoms concerning for COVID-19 infection (fever, chills, cough, or new shortness of breath).    Past Medical History:  Diagnosis Date  . Aortic stenosis    Mild February 2018  . Arthritis   . Atrial fibrillation [facility])    Diagnosed January 2018  . AVM (arteriovenous malformation)   . Carotid stenosis   . Diverticulosis   . Essential hypertension   . Gout   . Hx of adenomatous colonic polyps   . Hyperlipidemia   . IDA (iron deficiency anemia)   . Internal hemorrhoids   . Rheumatic fever 1937  . Stroke ([place]) 09/10/2016  . Tubular adenoma of colon    Past Surgical History:  Procedure Laterality Date  . CATARACT EXTRACTION W/PHACO Right 08/02/2014   Procedure: CATARACT EXTRACTION PHACO AND INTRAOCULAR LENS PLACEMENT; CDE:  12.77;  Surgeon: Williams Che, MD;  Location: [facility];  Service: Ophthalmology;  Laterality: Right;  . CATARACT EXTRACTION W/PHACO Left 02/15/2015   Procedure: CATARACT EXTRACTION PHACO AND INTRAOCULAR LENS PLACEMENT (IOC);  Surgeon: Williams Che, MD;  Location: [facility];  Service: Ophthalmology;  Laterality: Left;  CDE 18.00  . COLONOSCOPY    . COLONOSCOPY WITH PROPOFOL N/A 12/13/2016   Procedure: COLONOSCOPY WITH PROPOFOL;  Surgeon: Jerene Bears, MD;  Location: [facility];  Service: Gastroenterology;  Laterality: N/A;  . ELBOW SURGERY Right    "grissle in there"  . ENDARTERECTOMY Left 09/17/2016   Procedure: LEFT CAROTID ENDARTECTOMY;  Surgeon: Waynetta Sandy, MD;  Location: [place];  Service: Vascular;  Laterality: Left;  . ESOPHAGEAL DILATION  09/03/2013   Procedure: ESOPHAGEAL DILATION;  Surgeon: Rogene Houston, MD;  [facility];  Service: Endoscopy;;  . ESOPHAGOGASTRODUODENOSCOPY N/A 09/03/2013   Procedure:  ESOPHAGOGASTRODUODENOSCOPY (EGD);  Surgeon: Rogene Houston, MD;  [facility];  Service: Endoscopy;  Laterality: N/A;  255-rescheduled to 1030 Ann to notify pt  . ESOPHAGOGASTRODUODENOSCOPY (EGD) WITH PROPOFOL N/A 12/13/2016   Procedure: ESOPHAGOGASTRODUODENOSCOPY (EGD) WITH PROPOFOL;  Surgeon: Jerene Bears, MD;  Location: [facility];  Service: Gastroenterology;  Laterality: N/A;  . GIVENS CAPSULE STUDY N/A 08/17/2017   Procedure: GIVENS CAPSULE STUDY;  Surgeon: Danie Binder, MD;  [facility];  Service: Endoscopy;  Laterality: N/A;  . PATCH ANGIOPLASTY Left 09/17/2016   Procedure: [place] 1 CM X6 CM;  Surgeon: Waynetta Sandy, MD;  Location: [facility];  Service: Vascular;  Laterality: Left;     Current Meds  Medication Sig  . acetaminophen (TYLENOL) 325 MG tablet Take 650 mg by mouth daily as needed for mild pain or headache.   . allopurinol (ZYLOPRIM) 300 MG tablet Take 300 mg by mouth daily.  Marland Kitchen amiodarone (PACERONE) 200 MG tablet Take 0.5 tablets (100 mg total) by mouth daily.  Marland Kitchen apixaban (ELIQUIS) 2.5 MG TABS tablet Take 2.5 mg by mouth 2 (two) times daily.  Marland Kitchen atorvastatin (LIPITOR) 40 MG tablet Take 1 tablet (40 mg total) by mouth daily at 6 PM.  . ezetimibe (ZETIA) 10 MG tablet Take 10 mg by mouth daily.  Marland Kitchen FeFum-FePoly-FA-B Cmp-C-Biot (FOLIVANE-PLUS) CAPS TAKE 1 CAPSULE BY MOUTH EVERY DAY IN THE MORNING  . fish oil-omega-3 fatty acids 1000 MG capsule Take 1 g by mouth daily.  . furosemide (LASIX) 20 MG tablet Take 20 mg by mouth every other day.  . metoprolol tartrate (LOPRESSOR) 25 MG tablet Take 0.5 tablets (12.5 mg total) by mouth 2 (two) times daily.  . pantoprazole (PROTONIX) 40 MG tablet Take 1 tablet (40 mg total) by mouth daily.     Allergies:   Patient has no known allergies.   Social History   Tobacco Use  . Smoking status: Former Smoker    Packs/day: 1.00    Years: 20.00    Pack years: 20.00     Types: Cigarettes    Quit date: 1980    Years since quitting: 40.7  . Smokeless tobacco: Never Used  Substance Use Topics  . Alcohol use: No  . Drug use: No     Family Hx: The patient's family history includes CVA (age of onset: 71) in his mother; Pneumonia (age of onset: 82) in his father. There is no history of Colon cancer.  ROS:   Please see the history of present illness.    Chronic hearing loss. All other systems reviewed and are negative.   Prior CV studies:   The following studies were reviewed today:  Echocardiogram 08/17/2017: Study Conclusions  - Left ventricle: The cavity size was normal. Wall thickness was increased in a pattern of mild LVH. Systolic function was mildly reduced. The estimated ejection fraction was in the range of 45% to 50%. Diffuse hypokinesis. Features are consistent with a pseudonormal left ventricular filling pattern, with concomitant abnormal  relaxation and increased filling pressure (grade 2 diastolic dysfunction). Doppler parameters are consistent with high ventricular filling pressure. - Aortic valve: Moderately calcified annulus. Trileaflet; moderately thickened leaflets. There was mild to moderate stenosis. There was mild regurgitation. Valve area (VTI): 1.35 cm^2. Valve area (Vmax): 1.45 cm^2. Valve area (Vmean): 1.15 cm^2. - Mitral valve: There was mild regurgitation. - Left atrium: The atrium was moderately dilated. - Right ventricle: The cavity size was mildly dilated. - Right atrium: The atrium was mildly dilated. - Atrial septum: No defect or patent foramen ovale was identified. - Pulmonary arteries: Systolic pressure was moderately to severely increased. PA peak pressure: 67 mm Hg (S).  Carotid Dopplers 11/01/2017: Final Interpretation: Right Carotid: Velocities in the right ICA are consistent with a 1-39% stenosis.  Left         Patent left carotid endarterectomy site with no evidence of Carotid:      restenosis.                The ECA appears >50% stenosed.  Labs/Other Tests and Data Reviewed:    EKG:  An ECG dated 08/16/2017 was personally reviewed today and demonstrated:  Sinus rhythm with prolonged PR interval and incomplete left bundle branch block.  Recent Labs:  July 2020: Hemoglobin 8.6, platelets 288, BUN 34, creatinine 1.65, potassium 4.6, AST 26, ALT 18, cholesterol 117, triglycerides 170, HDL 29, LDL 54, PSA 0.6  Wt Readings from Last 3 Encounters:  05/11/19 218 lb (98.9 kg)  03/26/18 200 lb 12.8 oz (91.1 kg)  11/01/17 192 lb (87.1 kg)     Objective:    Vital Signs:  Ht 6\' 2"  (1.88 m)   Wt 218 lb (98.9 kg)   BMI 27.99 kg/m    Patient was not able to check vital signs today. He spoke in full sentences, not short of breath. No audible wheezing or coughing. Speech pattern normal.  ASSESSMENT & PLAN:    1.  Paroxysmal atrial fibrillation with CHADSVASC score of 5.  He remains on Eliquis at 2.5 mg twice daily stroke prophylaxis, otherwise low-dose amiodarone and Lopressor.  Recent lab work reviewed.  We discussed the possibility of ultimately stopping anticoagulation if GI bleeding comes a more major recurring issue, he continues to have fluctuating hemoglobin levels, no frank melena or hematochezia, but intermittent heme positive stools.  He has known AVMs.  Switching from Eliquis to Xarelto would be unlikely to make a dramatic difference in his bleeding risk.  2.  Intermittent loose stools, perhaps somewhat better with reduction in Lasix, he will continue 20 mg every other day.  3.  Previous history of stroke and left CEA.  He continues to follow with [facility] and had reassuring Dopplers last year.  4.  Mild to moderate calcific aortic stenosis, last echocardiogram was in January 2019 as outlined above.  We will consider a follow-up study around the time of his next visit.  COVID-19 Education: The signs and symptoms of COVID-19 were discussed with the patient and how  to seek care for testing (follow up with PCP or arrange E-visit).  The importance of social distancing was discussed today.  Time:   Today, I have spent 8 minutes with the patient with telehealth technology discussing the above problems.     Medication Adjustments/Labs and Tests Ordered: Current medicines are reviewed at length with the patient today.  Concerns regarding medicines are outlined above.   Tests Ordered: No orders of the defined types were placed in this encounter.  Medication Changes: No orders of the defined types were placed in this encounter.   Follow Up:  In Person 3 to 4 months in the [place] office.  Signed, [name], MD  05/11/2019 10:43 AM    [place]

## 2019-05-11 NOTE — Patient Instructions (Signed)
Medication Instructions:  Take lasix 20 mg- every other day   Continue the Eliquis 2.5 mg two times daily   Labwork: None  Testing/Procedures: None  Follow-Up: Your physician recommends that you schedule a follow-up appointment in:  4 months    Any Other Special Instructions Will Be Listed Below (If Applicable).     If you need a refill on your cardiac medications before your next appointment, please call your pharmacy.

## 2019-05-23 ENCOUNTER — Other Ambulatory Visit: Payer: Self-pay | Admitting: Student

## 2019-07-20 ENCOUNTER — Telehealth: Payer: Self-pay | Admitting: Cardiology

## 2019-07-20 NOTE — Telephone Encounter (Signed)
Patient called stating that he continues to get a statement for $14.55. States that he feels the bill was not filed correctly. He would like a phone call .

## 2019-09-17 DIAGNOSIS — Z23 Encounter for immunization: Secondary | ICD-10-CM | POA: Diagnosis not present

## 2019-10-16 DIAGNOSIS — Z23 Encounter for immunization: Secondary | ICD-10-CM | POA: Diagnosis not present

## 2019-11-18 DIAGNOSIS — D509 Iron deficiency anemia, unspecified: Secondary | ICD-10-CM | POA: Diagnosis not present

## 2019-11-18 DIAGNOSIS — E119 Type 2 diabetes mellitus without complications: Secondary | ICD-10-CM | POA: Diagnosis not present

## 2019-11-18 DIAGNOSIS — Z86010 Personal history of colonic polyps: Secondary | ICD-10-CM | POA: Diagnosis not present

## 2019-11-18 DIAGNOSIS — E538 Deficiency of other specified B group vitamins: Secondary | ICD-10-CM | POA: Diagnosis not present

## 2020-02-02 ENCOUNTER — Encounter: Payer: Self-pay | Admitting: *Deleted

## 2020-02-03 ENCOUNTER — Encounter: Payer: Self-pay | Admitting: Cardiology

## 2020-02-03 ENCOUNTER — Telehealth (INDEPENDENT_AMBULATORY_CARE_PROVIDER_SITE_OTHER): Payer: Medicare Other | Admitting: Cardiology

## 2020-02-03 VITALS — Ht 74.0 in | Wt 220.0 lb

## 2020-02-03 DIAGNOSIS — I350 Nonrheumatic aortic (valve) stenosis: Secondary | ICD-10-CM | POA: Diagnosis not present

## 2020-02-03 DIAGNOSIS — Z8719 Personal history of other diseases of the digestive system: Secondary | ICD-10-CM

## 2020-02-03 DIAGNOSIS — I480 Paroxysmal atrial fibrillation: Secondary | ICD-10-CM | POA: Diagnosis not present

## 2020-02-03 NOTE — Patient Instructions (Addendum)
Medication Instructions:   Your physician recommends that you continue on your current medications as directed. Please refer to the Current Medication list given to you today.  Labwork:  NONE  Testing/Procedures: Your physician has requested that you have an echocardiogram in 3 months at [facility] just before your next visit. Echocardiography is a painless test that uses sound waves to create images of your heart. It provides your doctor with information about the size and shape of your heart and how well your heart's chambers and valves are working. This procedure takes approximately one hour. There are no restrictions for this procedure.  Follow-Up:  Your physician recommends that you schedule a follow-up appointment in: 3 months (office) [place].  Any Other Special Instructions Will Be Listed Below (If Applicable).  If you need a refill on your cardiac medications before your next appointment, please call your pharmacy.

## 2020-02-03 NOTE — Progress Notes (Signed)
Virtual Visit via Telephone Note   This visit type was conducted due to national recommendations for restrictions regarding the COVID-19 Pandemic (e.g. social distancing) in an effort to limit this patient's exposure and mitigate transmission in our community.  Due to his co-morbid illnesses, this patient is at least at moderate risk for complications without adequate follow up.  This format is felt to be most appropriate for this patient at this time.  The patient did not have access to video technology/had technical difficulties with video requiring transitioning to audio format only (telephone).  All issues noted in this document were discussed and addressed.  No physical exam could be performed with this format.  Please refer to the patient's chart for his  consent to telehealth for [facility].   The patient was identified using 2 identifiers.  Date:  02/03/2020   ID:  Ronald Colon, DOB 08-Nov-1928, MRN [id number]  Patient Location: Home Provider Location: Office  PCP:  [name], MD  Cardiologist:  [name], MD Electrophysiologist:  None   Evaluation Performed:  Follow-Up Visit  Chief Complaint:   Cardiac follow-up  History of Present Illness:    Ronald Colon is an 86 y.o. male last assessed via telehealth encounter in September 2020.  We spoke by phone today.  He states that he has been doing well overall, bothered by arthritis and back pain, but no palpitations or progressive shortness of breath with typical ADLs.  He also reports no obvious changes in his stools or bleeding.  He states that he had lab work with Dr. Hilma Favors recently and hemoglobin was up to 11.6.  He has been able to stay on low-dose Eliquis.  I reviewed his medications which are otherwise stable from a cardiac perspective and outlined below.  Last echocardiogram was in 2019 as noted below.  Mild to moderate aortic stenosis described at that time.   Past Medical History:  Diagnosis Date  .  Aortic stenosis    Mild February 2018  . Arthritis   . Atrial fibrillation [facility])    Diagnosed January 2018  . AVM (arteriovenous malformation)   . Carotid stenosis   . Diverticulosis   . Essential hypertension   . Gout   . Hx of adenomatous colonic polyps   . Hyperlipidemia   . IDA (iron deficiency anemia)   . Internal hemorrhoids   . Rheumatic fever 1937  . Stroke ([place]) 09/10/2016  . Tubular adenoma of colon    Past Surgical History:  Procedure Laterality Date  . CATARACT EXTRACTION W/PHACO Right 08/02/2014   Procedure: CATARACT EXTRACTION PHACO AND INTRAOCULAR LENS PLACEMENT; CDE:  12.77;  Surgeon: Williams Che, MD;  [facility]: [facility];  Service: Ophthalmology;  Laterality: Right;  . CATARACT EXTRACTION W/PHACO Left 02/15/2015   Procedure: CATARACT EXTRACTION PHACO AND INTRAOCULAR LENS PLACEMENT (IOC);  Surgeon: Williams Che, MD;  Location: [facility];  Service: Ophthalmology;  Laterality: Left;  CDE 18.00  . COLONOSCOPY    . COLONOSCOPY WITH PROPOFOL N/A 12/13/2016   Procedure: COLONOSCOPY WITH PROPOFOL;  Surgeon: Jerene Bears, MD;  [facility];  Service: Gastroenterology;  Laterality: N/A;  . ELBOW SURGERY Right    "grissle in there"  . ENDARTERECTOMY Left 09/17/2016   Procedure: LEFT CAROTID ENDARTECTOMY;  Surgeon: Waynetta Sandy, MD;  Location: [place];  Service: Vascular;  Laterality: Left;  . ESOPHAGEAL DILATION  09/03/2013   Procedure: ESOPHAGEAL DILATION;  Surgeon: Rogene Houston, MD;  [facility];  Service: Endoscopy;;  . ESOPHAGOGASTRODUODENOSCOPY N/A 09/03/2013   Procedure: ESOPHAGOGASTRODUODENOSCOPY (EGD);  Surgeon: Rogene Houston, MD;  [facility];  Service: Endoscopy;  Laterality: N/A;  255-rescheduled to 1030 Ann to notify pt  . ESOPHAGOGASTRODUODENOSCOPY (EGD) WITH PROPOFOL N/A 12/13/2016   Procedure: ESOPHAGOGASTRODUODENOSCOPY (EGD) WITH PROPOFOL;  Surgeon: Jerene Bears, MD;  [facility];  Service:  Gastroenterology;  Laterality: N/A;  . GIVENS CAPSULE STUDY N/A 08/17/2017   Procedure: GIVENS CAPSULE STUDY;  Surgeon: Danie Binder, MD;  [facility];  Service: Endoscopy;  Laterality: N/A;  . PATCH ANGIOPLASTY Left 09/17/2016   Procedure: Meno 1 CM X6 CM;  Surgeon: Waynetta Sandy, MD;  [facility];  Service: Vascular;  Laterality: Left;     Current Meds  Medication Sig  . acetaminophen (TYLENOL) 325 MG tablet Take 650 mg by mouth daily as needed for mild pain or headache.   . allopurinol (ZYLOPRIM) 300 MG tablet Take 300 mg by mouth daily.  Marland Kitchen amiodarone (PACERONE) 200 MG tablet Take 0.5 tablets (100 mg total) by mouth daily.  Marland Kitchen atorvastatin (LIPITOR) 40 MG tablet Take 1 tablet (40 mg total) by mouth daily at 6 PM.  . colchicine 0.6 MG tablet Take 0.6 mg by mouth 2 (two) times daily as needed (gout).   Marland Kitchen ELIQUIS 2.5 MG TABS tablet TAKE 1 TABLET BY MOUTH TWICE A DAY  . ezetimibe (ZETIA) 10 MG tablet Take 10 mg by mouth daily.  Marland Kitchen FeFum-FePoly-FA-B Cmp-C-Biot (FOLIVANE-PLUS) CAPS TAKE 1 CAPSULE BY MOUTH EVERY DAY IN THE MORNING  . fish oil-omega-3 fatty acids 1000 MG capsule Take 1 g by mouth daily.  . furosemide (LASIX) 20 MG tablet Take 20 mg by mouth every other day.  . metoprolol tartrate (LOPRESSOR) 25 MG tablet Take 0.5 tablets (12.5 mg total) by mouth 2 (two) times daily.  . pantoprazole (PROTONIX) 40 MG tablet Take 1 tablet (40 mg total) by mouth daily.     Allergies:   Patient has no known allergies.   ROS:   Chronic arthritic pain.  Prior CV studies:   The following studies were reviewed today:  Echocardiogram 08/17/2017: Study Conclusions  - Left ventricle: The cavity size was normal. Wall thickness was increased in a pattern of mild LVH. Systolic function was mildly reduced. The estimated ejection fraction was in the range of 45% to 50%. Diffuse hypokinesis. Features are consistent with  a pseudonormal left ventricular filling pattern, with concomitant abnormal relaxation and increased filling pressure (grade 2 diastolic dysfunction). Doppler parameters are consistent with high ventricular filling pressure. - Aortic valve: Moderately calcified annulus. Trileaflet; moderately thickened leaflets. There was mild to moderate stenosis. There was mild regurgitation. Valve area (VTI): 1.35 cm^2. Valve area (Vmax): 1.45 cm^2. Valve area (Vmean): 1.15 cm^2. - Mitral valve: There was mild regurgitation. - Left atrium: The atrium was moderately dilated. - Right ventricle: The cavity size was mildly dilated. - Right atrium: The atrium was mildly dilated. - Atrial septum: No defect or patent foramen ovale was identified. - Pulmonary arteries: Systolic pressure was moderately to severely increased. PA peak pressure: 67 mm Hg (S).  Carotid Dopplers 11/01/2017: Final Interpretation: Right Carotid: Velocities in the right ICA are consistent with a 1-39% stenosis.  Left Patent left carotid endarterectomy site with no evidence of Carotid: restenosis.  The ECA appears >50% stenosed.  Labs/Other Tests and Data Reviewed:    EKG:  An ECG dated 08/16/2017 was personally reviewed today  and demonstrated:  Sinus rhythm with prolonged PR interval and incomplete left bundle branch block.  Recent Labs:  July 2020: Hemoglobin 8.6, platelets 288, BUN 34, creatinine 1.65, potassium 4.6, AST 26, ALT 18, cholesterol 117, triglycerides 170, HDL 29, LDL 54, PSA 0.6  Wt Readings from Last 3 Encounters:  02/03/20 220 lb (99.8 kg)  05/11/19 218 lb (98.9 kg)  03/26/18 200 lb 12.8 oz (91.1 kg)     Objective:    Vital Signs:  Ht 6\' 2"  (1.88 m)   Wt 220 lb (99.8 kg)   BMI 28.25 kg/m    Unable to obtain vital signs today. Patient spoke in full sentences, not short of breath.  ASSESSMENT & PLAN:    1.  Paroxysmal atrial fibrillation.  CHA2DS2-VASc  score is 5.  He is tolerating low-dose Eliquis at this time without worsening, recurrent GI bleeding and reportedly hemoglobin up to 11.6.  We will request his recent lab work from PCP.  Otherwise continue low-dose amiodarone, Lopressor, and Eliquis at 2.5 mg twice daily.  2.  History of GI bleeding with known AVMs.  3.  Mild to moderate calcific aortic stenosis, last assessed in 2019.  We will obtain an echocardiogram for his next visit in 3 months.   Time:   Today, I have spent 6 minutes with the patient with telehealth technology discussing the above problems.     Medication Adjustments/Labs and Tests Ordered: Current medicines are reviewed at length with the patient today.  Concerns regarding medicines are outlined above.   Tests Ordered: Orders Placed This Encounter  Procedures  . ECHOCARDIOGRAM COMPLETE    Medication Changes: No orders of the defined types were placed in this encounter.   Follow Up:  In Person 3 months in the [place] office.  Signed, [name], MD  02/03/2020 9:19 AM    [place]

## 2020-02-12 ENCOUNTER — Encounter: Payer: Self-pay | Admitting: Cardiology

## 2020-05-02 ENCOUNTER — Other Ambulatory Visit (HOSPITAL_COMMUNITY): Payer: Medicare Other

## 2020-05-06 ENCOUNTER — Ambulatory Visit: Payer: Medicare Other | Admitting: Cardiology

## 2020-05-07 ENCOUNTER — Other Ambulatory Visit: Payer: Self-pay | Admitting: Cardiology

## 2020-05-20 DIAGNOSIS — E119 Type 2 diabetes mellitus without complications: Secondary | ICD-10-CM | POA: Diagnosis not present

## 2020-05-20 DIAGNOSIS — Z1331 Encounter for screening for depression: Secondary | ICD-10-CM | POA: Diagnosis not present

## 2020-05-20 DIAGNOSIS — I10 Essential (primary) hypertension: Secondary | ICD-10-CM | POA: Diagnosis not present

## 2020-05-20 DIAGNOSIS — M1990 Unspecified osteoarthritis, unspecified site: Secondary | ICD-10-CM | POA: Diagnosis not present

## 2020-05-20 DIAGNOSIS — Z23 Encounter for immunization: Secondary | ICD-10-CM | POA: Diagnosis not present

## 2020-05-20 DIAGNOSIS — Z6826 Body mass index (BMI) 26.0-26.9, adult: Secondary | ICD-10-CM | POA: Diagnosis not present

## 2020-05-20 DIAGNOSIS — Z0000 Encounter for general adult medical examination without abnormal findings: Secondary | ICD-10-CM | POA: Diagnosis not present

## 2020-06-30 DIAGNOSIS — Z23 Encounter for immunization: Secondary | ICD-10-CM | POA: Diagnosis not present

## 2020-07-20 ENCOUNTER — Other Ambulatory Visit (HOSPITAL_COMMUNITY): Payer: Medicare Other

## 2020-09-28 ENCOUNTER — Other Ambulatory Visit: Payer: Self-pay | Admitting: Cardiology

## 2021-02-22 DIAGNOSIS — Z6826 Body mass index (BMI) 26.0-26.9, adult: Secondary | ICD-10-CM | POA: Diagnosis not present

## 2021-02-22 DIAGNOSIS — Z0000 Encounter for general adult medical examination without abnormal findings: Secondary | ICD-10-CM | POA: Diagnosis not present

## 2021-02-22 DIAGNOSIS — Z23 Encounter for immunization: Secondary | ICD-10-CM | POA: Diagnosis not present

## 2021-02-22 DIAGNOSIS — Z1389 Encounter for screening for other disorder: Secondary | ICD-10-CM | POA: Diagnosis not present

## 2021-03-09 DIAGNOSIS — Z23 Encounter for immunization: Secondary | ICD-10-CM | POA: Diagnosis not present

## 2021-03-16 DIAGNOSIS — C44329 Squamous cell carcinoma of skin of other parts of face: Secondary | ICD-10-CM | POA: Diagnosis not present

## 2021-03-31 DIAGNOSIS — Z8673 Personal history of transient ischemic attack (TIA), and cerebral infarction without residual deficits: Secondary | ICD-10-CM | POA: Diagnosis not present

## 2021-03-31 DIAGNOSIS — I482 Chronic atrial fibrillation, unspecified: Secondary | ICD-10-CM | POA: Diagnosis not present

## 2021-03-31 DIAGNOSIS — M48061 Spinal stenosis, lumbar region without neurogenic claudication: Secondary | ICD-10-CM | POA: Diagnosis not present

## 2021-03-31 DIAGNOSIS — Z1331 Encounter for screening for depression: Secondary | ICD-10-CM | POA: Diagnosis not present

## 2021-03-31 DIAGNOSIS — E782 Mixed hyperlipidemia: Secondary | ICD-10-CM | POA: Diagnosis not present

## 2021-03-31 DIAGNOSIS — E119 Type 2 diabetes mellitus without complications: Secondary | ICD-10-CM | POA: Diagnosis not present

## 2021-03-31 DIAGNOSIS — Z6826 Body mass index (BMI) 26.0-26.9, adult: Secondary | ICD-10-CM | POA: Diagnosis not present

## 2021-03-31 DIAGNOSIS — D509 Iron deficiency anemia, unspecified: Secondary | ICD-10-CM | POA: Diagnosis not present

## 2021-03-31 DIAGNOSIS — E663 Overweight: Secondary | ICD-10-CM | POA: Diagnosis not present

## 2021-04-11 ENCOUNTER — Telehealth: Payer: Self-pay | Admitting: Cardiology

## 2021-04-11 NOTE — Telephone Encounter (Signed)
Patient requesting a call back from the nurse to discuss his medication.  He feels like it may be causing some internal bleeding or something with his stomach.

## 2021-04-11 NOTE — Telephone Encounter (Signed)
Spoke with pt and he denies blood in stool or urine. He does c/o SOB and Abd pain when eatting. Pt states that when he takes Mylanta the pain and SOB goes away.

## 2021-04-11 NOTE — Telephone Encounter (Signed)
Pt notified and states that he has an appt with PCP on Thru.

## 2021-04-13 ENCOUNTER — Other Ambulatory Visit (HOSPITAL_COMMUNITY): Payer: Self-pay | Admitting: Family Medicine

## 2021-04-13 ENCOUNTER — Other Ambulatory Visit: Payer: Self-pay

## 2021-04-13 ENCOUNTER — Ambulatory Visit (HOSPITAL_COMMUNITY)
Admission: RE | Admit: 2021-04-13 | Discharge: 2021-04-13 | Disposition: A | Discharge status: Home / Self Care | Payer: Medicare Other | Source: Ambulatory Visit | Attending: Family Medicine | Admitting: Family Medicine

## 2021-04-13 DIAGNOSIS — E663 Overweight: Secondary | ICD-10-CM | POA: Diagnosis not present

## 2021-04-13 DIAGNOSIS — M1990 Unspecified osteoarthritis, unspecified site: Secondary | ICD-10-CM | POA: Diagnosis not present

## 2021-04-13 DIAGNOSIS — J9811 Atelectasis: Secondary | ICD-10-CM | POA: Diagnosis not present

## 2021-04-13 DIAGNOSIS — Z6826 Body mass index (BMI) 26.0-26.9, adult: Secondary | ICD-10-CM | POA: Diagnosis not present

## 2021-04-13 DIAGNOSIS — I10 Essential (primary) hypertension: Secondary | ICD-10-CM | POA: Diagnosis not present

## 2021-04-13 DIAGNOSIS — Z860100 Personal history of colon polyps, unspecified: Secondary | ICD-10-CM

## 2021-04-13 DIAGNOSIS — J811 Chronic pulmonary edema: Secondary | ICD-10-CM | POA: Diagnosis not present

## 2021-04-13 DIAGNOSIS — J90 Pleural effusion, not elsewhere classified: Secondary | ICD-10-CM | POA: Diagnosis not present

## 2021-04-13 DIAGNOSIS — E119 Type 2 diabetes mellitus without complications: Secondary | ICD-10-CM | POA: Diagnosis not present

## 2021-04-13 DIAGNOSIS — K219 Gastro-esophageal reflux disease without esophagitis: Secondary | ICD-10-CM

## 2021-04-13 DIAGNOSIS — E7849 Other hyperlipidemia: Secondary | ICD-10-CM | POA: Diagnosis not present

## 2021-04-13 DIAGNOSIS — K922 Gastrointestinal hemorrhage, unspecified: Secondary | ICD-10-CM | POA: Diagnosis not present

## 2021-04-13 DIAGNOSIS — R0602 Shortness of breath: Secondary | ICD-10-CM | POA: Diagnosis not present

## 2021-04-25 DIAGNOSIS — J811 Chronic pulmonary edema: Secondary | ICD-10-CM | POA: Diagnosis not present

## 2021-05-19 ENCOUNTER — Encounter: Payer: Self-pay | Admitting: Internal Medicine

## 2021-05-22 ENCOUNTER — Telehealth: Payer: Self-pay | Admitting: Physician Assistant

## 2021-05-22 NOTE — Telephone Encounter (Signed)
Inbound call from patient requesting a call from a nurse please.  States has been experiencing shortness of breath and is inquiring about getting an EGD done.  Please advise.

## 2021-05-22 NOTE — Telephone Encounter (Signed)
Called patient back and asked about his SOB. States it is only sometimes with exertion and that he has been checked out by his Cardiologist. He is asking about a referral made by his [employer]. I asked him to call there office to find out the status of it.

## 2021-05-23 ENCOUNTER — Encounter (INDEPENDENT_AMBULATORY_CARE_PROVIDER_SITE_OTHER): Payer: Self-pay | Admitting: *Deleted

## 2021-05-31 DIAGNOSIS — Z23 Encounter for immunization: Secondary | ICD-10-CM | POA: Diagnosis not present

## 2021-07-03 ENCOUNTER — Other Ambulatory Visit (INDEPENDENT_AMBULATORY_CARE_PROVIDER_SITE_OTHER): Payer: Self-pay | Admitting: *Deleted

## 2021-07-03 ENCOUNTER — Telehealth (INDEPENDENT_AMBULATORY_CARE_PROVIDER_SITE_OTHER): Payer: Self-pay | Admitting: *Deleted

## 2021-07-03 ENCOUNTER — Encounter (INDEPENDENT_AMBULATORY_CARE_PROVIDER_SITE_OTHER): Payer: Self-pay | Admitting: Gastroenterology

## 2021-07-03 ENCOUNTER — Other Ambulatory Visit: Payer: Self-pay

## 2021-07-03 ENCOUNTER — Ambulatory Visit (INDEPENDENT_AMBULATORY_CARE_PROVIDER_SITE_OTHER): Payer: Medicare Other | Admitting: Gastroenterology

## 2021-07-03 VITALS — BP 128/88 | HR 76 | Temp 97.9°F | Ht 74.0 in | Wt 189.5 lb

## 2021-07-03 DIAGNOSIS — K219 Gastro-esophageal reflux disease without esophagitis: Secondary | ICD-10-CM | POA: Diagnosis not present

## 2021-07-03 MED ORDER — SUCRALFATE 1 GM/10ML PO SUSP: 1.0000 g | Freq: Three times a day (TID) | ORAL | 0 refills | Status: DC

## 2021-07-03 MED ORDER — PANTOPRAZOLE SODIUM 40 MG PO TBEC: 40.0000 mg | DELAYED_RELEASE_TABLET | Freq: Every day | ORAL | 3 refills | Status: DC

## 2021-07-03 MED ORDER — PANTOPRAZOLE SODIUM 40 MG PO TBEC: 40.0000 mg | DELAYED_RELEASE_TABLET | Freq: Two times a day (BID) | ORAL | 3 refills | Status: AC

## 2021-07-03 NOTE — Patient Instructions (Addendum)
Please increase your pantoprazole to twice daily, take one in the mornings 30-45 minutes prior to breakfast and take the second one in the evenings (you can continue this dose at 6pm as you are doing now).  Let's restart the carafate, I will send a refill for this, you can take It up to 4 times per day but if you feel it is keeping you awake at night, you can hold off on the bedtime dose.  Please let me know if abdominal pain gets worse, you develop nausea, vomiting, changes in your appetite, blood in your stools or black tarry stools. If you start to have dizziness, fatigue, worsening shortness of breath or any episodes where you pass out, please let me know.   If you have worsening symptoms or current symptoms are not resolved at next visit, we will proceed with EGD to further evaluate for inflammation/ulcer or infection in your stomach.   Follow up 4-6 weeks

## 2021-07-03 NOTE — Progress Notes (Signed)
Referring Provider: [name], MD Primary Care Physician:  [name], MD Primary GI Physician: newly established, previously Dr. Hilarie Fredrickson  Chief Complaint  Patient presents with   Gastroesophageal Reflux    Patient states he has a history of a bleeding ulcer. He states some time in September this year he started having issues after eating with the burning sensation that starts in the lower part of abdomin and extends to the epigastric area. Denies any nausea and vomiting, no dizziness. He does have some dark stools at times ,but he is on Fe due to history of anemia.He has noticed some sob during the burning episodes. He is taking Pantoprazole 40 mg once per day. Appetite is good,but watches what he eats.   HPI:   [name] is a 87 y.o. male with past medical history of aortic stenosis, arthritis, A fib on Eliquis, AVM, diverticulosis, HTN, Gout, HLD, IDA, tubular adenoma of colon, PUD/  Patient presenting today as a new patient referred by Delman Cheadle, PA-C for worsening indigestion. Patient is accompanied by his son.  Patient reports that in September, he started having issues with burning sensation in his abdomen. He reports that pain started in his lower abdomen and would radiate up to mid epigastric area. He reports that he would previously take tums (states he was "eating them like candy" without any improvement in pain) he then stoppped tums and started using mylant, which would ease it off. He reports that he still experiences epigastric burning, about 30-45 minutes after eating, however, it has improved and is not as severe in nature. He is taking mylanta maybe 5-6 days out of the week, 2-3x/day. He reports that he takes PO iron. He states that stools are dark at baseline, but has not noticed any black stools or blood in stools, he uses a little pad that he puts in the toilet after having a BM that turns color if there is presence of blood, he reports he has not had any  positive instances.  He denies acid regurgitation into his throat, dysphagia, early satiety, weight loss, odynophagia, bloating, nausea or vomiting, diarrhea or constipation. Does reports that he feels some phelgm in his throat upon waking but otherwise no issues, states this has been present since he had a stroke a few years ago.   Currently on pantoprazole 40mg , was advised by PCP in September to increase this to BID, however, patient preferred to continue taking it only once a day, he takes it at 6pm in the evening, and he was started on Sucralafate 100mg  QID on 04/13/21, son reports that patient stated he thought  medication was keeping him awake at night so he stopped taking it before it was gone. He is unsure if this helped his symptoms.   NSAID use:no Social hx:no tobacco, no ETOH  Last Colonoscopy:12/13/16- The examined portion of the ileum was normal. - One 8 mm polyp in the cecum,  - One 3 mm polyp in the ascending colon, - Five 4 to 7 mm polyps in the ascending colon,  - A single colonic angiodysplastic lesion. Treated with argon beam coagulation. - Three 4 to 7 mm polyps in the transverse colon - One 8 mm polyp in the descending colon,  - Mild diverticulosis in the sigmoid colon, in the descending colon and in the transverse colon. There was narrowing of the colon in association with the diverticular opening. - Internal hemorrhoids. Last Endoscopy:12/13/16- Normal esophagus. - Normal stomach. - Normal examined duodenum. -  No specimens collected. Capsule endoscopy:08/29/17 r/t IDA, AVMs  Recommendations:   Past Medical History:  Diagnosis Date   Aortic stenosis    Mild February 2018   Arthritis    Atrial fibrillation [facility])    Diagnosed January 2018   AVM (arteriovenous malformation)    Carotid stenosis    Diverticulosis    Essential hypertension    Gout    Hx of adenomatous colonic polyps    Hyperlipidemia    IDA (iron deficiency anemia)    Internal hemorrhoids     Rheumatic fever 1937   Stroke ([place]) 09/10/2016   Tubular adenoma of colon     Past Surgical History:  Procedure Laterality Date   CATARACT EXTRACTION W/PHACO Right 08/02/2014   Procedure: CATARACT EXTRACTION PHACO AND INTRAOCULAR LENS PLACEMENT; CDE:  12.77;  Surgeon: Williams Che, MD;  [facility];  Service: Ophthalmology;  Laterality: Right;   CATARACT EXTRACTION W/PHACO Left 02/15/2015   Procedure: CATARACT EXTRACTION PHACO AND INTRAOCULAR LENS PLACEMENT (IOC);  Surgeon: Williams Che, MD;  [facility];  Service: Ophthalmology;  Laterality: Left;  CDE 18.00   COLONOSCOPY     COLONOSCOPY WITH PROPOFOL N/A 12/13/2016   Procedure: COLONOSCOPY WITH PROPOFOL;  Surgeon: Jerene Bears, MD;  [facility];  Service: Gastroenterology;  Laterality: N/A;   ELBOW SURGERY Right    "grissle in there"   ENDARTERECTOMY Left 09/17/2016   Procedure: LEFT CAROTID ENDARTECTOMY;  Surgeon: Waynetta Sandy, MD;  Location: [place];  Service: Vascular;  Laterality: Left;   ESOPHAGEAL DILATION  09/03/2013   Procedure: ESOPHAGEAL DILATION;  Surgeon: Rogene Houston, MD;  [facility];  Service: Endoscopy;;   ESOPHAGOGASTRODUODENOSCOPY N/A 09/03/2013   Procedure: ESOPHAGOGASTRODUODENOSCOPY (EGD);  Surgeon: Rogene Houston, MD;  [facility];  Service: Endoscopy;  Laterality: N/A;  255-rescheduled to 1030 Ann to notify pt   ESOPHAGOGASTRODUODENOSCOPY (EGD) WITH PROPOFOL N/A 12/13/2016   Procedure: ESOPHAGOGASTRODUODENOSCOPY (EGD) WITH PROPOFOL;  Surgeon: Jerene Bears, MD;  [facility];  Service: Gastroenterology;  Laterality: N/A;   GIVENS CAPSULE STUDY N/A 08/17/2017   Procedure: GIVENS CAPSULE STUDY;  Surgeon: Danie Binder, MD;  [facility];  Service: Endoscopy;  Laterality: N/A;   PATCH ANGIOPLASTY Left 09/17/2016   Procedure: PATCH ANGIOPLASTY WITH Toco 1 CM X6 CM;  Surgeon: Waynetta Sandy, MD;  Location: [facility];   Service: Vascular;  Laterality: Left;    Current Outpatient Medications  Medication Sig Dispense Refill   acetaminophen (TYLENOL) 325 MG tablet Take 650 mg by mouth daily as needed for mild pain or headache.      allopurinol (ZYLOPRIM) 300 MG tablet Take 300 mg by mouth daily.     alum & mag hydroxide-simeth (MAALOX/MYLANTA) 200-200-20 MG/5ML suspension Take by mouth every 6 (six) hours as needed for indigestion or heartburn.     amiodarone (PACERONE) 200 MG tablet Take 0.5 tablets (100 mg total) by mouth daily. 45 tablet 3   atorvastatin (LIPITOR) 40 MG tablet Take 1 tablet (40 mg total) by mouth daily at 6 PM. 30 tablet 11   colchicine 0.6 MG tablet Take 0.6 mg by mouth 2 (two) times daily as needed (gout).      ELIQUIS 2.5 MG TABS tablet TAKE 1 TABLET BY MOUTH TWICE A DAY 180 tablet 3   ezetimibe (ZETIA) 10 MG tablet Take 10 mg by mouth daily.     FeFum-FePoly-FA-B Cmp-C-Biot (FOLIVANE-PLUS) CAPS TAKE 1 CAPSULE BY  MOUTH EVERY DAY IN THE MORNING 90 capsule 0   fish oil-omega-3 fatty acids 1000 MG capsule Take 1 g by mouth daily.     furosemide (LASIX) 20 MG tablet Take 20 mg by mouth daily.     metoprolol tartrate (LOPRESSOR) 25 MG tablet Take 0.5 tablets (12.5 mg total) by mouth 2 (two) times daily. 90 tablet 3   pantoprazole (PROTONIX) 40 MG tablet Take 1 tablet (40 mg total) by mouth daily. 30 tablet 0   No current facility-administered medications for this visit.    Allergies as of 07/03/2021   (No Known Allergies)    Family History  Problem Relation Age of Onset   CVA Mother 61   Pneumonia Father 50   Colon cancer Neg Hx     Social History   Socioeconomic History   Marital status: Married    Spouse name: Not on file   Number of children: 1   Years of education: Not on file   Highest education level: Not on file  Occupational History   Occupation: Retired  Tobacco Use   Smoking status: Former    Packs/day: 1.00    Years: 20.00    Pack years: 20.00    Types:  Cigarettes    Quit date: 1980    Years since quitting: 42.9   Smokeless tobacco: Never  Vaping Use   Vaping Use: Never used  Substance and Sexual Activity   Alcohol use: No   Drug use: No   Sexual activity: Not on file  Other Topics Concern   Not on file  Social History Narrative   Not on file   Social Determinants of Health   Financial Resource Strain: Not on file  Food Insecurity: Not on file  Transportation Needs: Not on file  Physical Activity: Not on file  Stress: Not on file  Social Connections: Not on file   Review of systems General: negative for malaise, night sweats, fever, chills, weight loss Neck: Negative for lumps, goiter, pain and significant neck swelling Resp: Negative for cough, wheezing, dyspnea at rest CV: Negative for chest pain, leg swelling, palpitations, orthopnea GI: denies melena, hematochezia, nausea, vomiting, diarrhea, constipation, dysphagia, odyonophagia, early satiety or unintentional weight loss. +epigastric burning MSK: Negative for joint pain or swelling, back pain, and muscle pain. Derm: Negative for itching or rash Psych: Denies depression, anxiety, memory loss, confusion. No homicidal or suicidal ideation.  Heme: Negative for prolonged bleeding, bruising easily, and swollen nodes. Endocrine: Negative for cold or heat intolerance, polyuria, polydipsia and goiter. Neuro: negative for tremor, gait imbalance, syncope and seizures. The remainder of the review of systems is noncontributory.  Physical Exam: BP 128/88 (BP Location: Left Arm, Patient Position: Sitting, Cuff Size: Large)   Pulse 76   Temp 97.9 F (36.6 C) (Oral)   Ht 6\' 2"  (1.88 m)   Wt 189 lb 8 oz (86 kg)   BMI 24.33 kg/m  General:   Alert and oriented. No distress noted. Pleasant and cooperative.  Head:  Normocephalic and atraumatic. Eyes:  Conjuctiva clear without scleral icterus. Mouth:  Oral mucosa pink and moist. Good dentition. No lesions. Heart: Normal rate and  rhythm,  Presence of murmur Lungs: Clear lung sounds in all lobes. Respirations equal and unlabored. Abdomen:  +BS, soft, non-tender and non-distended. No rebound or guarding. No HSM or masses noted. Derm: No palmar erythema or jaundice Msk:  Symmetrical without gross deformities. Normal posture. Extremities:  Without edema. Neurologic:  Alert and  oriented x4  Psych:  Alert and cooperative. Normal mood and affect.  Invalid input(s): 6 MONTHS   ASSESSMENT: DAWSEN KRIEGER is a 87 y.o. male presenting today for ongoing burning in epigastric area after eating.   He is taking pantoprazole 40mg  once daily in the evening and is using mylanta 5-6 days out of the week for burning epigastric pain 30-45 minutes after eating. He has no red flag symptoms. We discussed indications of EGD for further evaluation of his symptoms, as well as risks associated with his advanced age and current anticoagulation.   All questions were answered, patient prefers to attempt further medical management and proceed with endoscopic evaluation if symptoms worsen, or have not improved at next visit. I think this is reasonable given his lack of red flag symptoms. It is possible he has PUD given hx of ulcer in the distant past, however, he is not having any overt GI bleeding, does not take any NSAIDs or drink alcohol, he could also be experiencing gastritis r/t uncontrolled GERD.He will make me aware of any new GI symptoms, symptoms of anemia, which we discussed or worsening of his current symptoms.   PLAN:  Increase pantoprazole to BID 2. Restart carafate QID, can hold bedtime dose if causing insomnia 3. Pt to make me aware of worsening symptoms, rectal bleeding, black stools, fatigue, dizziness, sob 4. Will proceed with EGD if symptoms not improved at next visit    Follow Up: 4-6 weeks  Derell Bruun L. Alver Sorrow, MSN, APRN, AGNP-C Adult-Gerontology Nurse Practitioner [facility] [place] [facility]

## 2021-07-03 NOTE — Telephone Encounter (Signed)
Pt's son [name] called and wanted to make sure 90 day supply or protonix got sent to pharm. Med was sent. One bid #[phone]. Tried to call pt twice and no answer. Vm not set up.

## 2021-07-04 NOTE — Telephone Encounter (Signed)
Tried calling pt again and no answer

## 2021-07-12 ENCOUNTER — Ambulatory Visit: Payer: Medicare Other | Admitting: Internal Medicine

## 2021-07-18 ENCOUNTER — Telehealth (INDEPENDENT_AMBULATORY_CARE_PROVIDER_SITE_OTHER): Payer: Self-pay

## 2021-07-18 NOTE — Telephone Encounter (Signed)
Patient son Ronald Colon called today stating patient was prescribed Carafate QID and it is causing insomnia.He took it the first day at all meals and then at bedtime. He stopped taking the bedtime dose and was still having issues with sleep, so patient stopped it all together. He is also supposed to be on Protonix 40 bid, but patient some times forgets his second dose. Per patient son patient still having some issues with stomach pain. Please advise.

## 2021-07-19 NOTE — Telephone Encounter (Signed)
I spoke with the patient son Christia Reading, he states the patient is still having some abdominal pain after eating, and he does see dark stools,but contributes this to the oral Fe he takes. He has not seen any bright red blood. I advised that they need to make sure his father is taking ppi bid. He states understanding also aware if the symptoms get no better to call the office and let us know as patient will need to proceed with EGD. Patient son states understanding.

## 2021-08-13 ENCOUNTER — Other Ambulatory Visit: Payer: Self-pay | Admitting: Cardiology

## 2021-08-15 NOTE — Telephone Encounter (Signed)
Prescription refill request for Eliquis received. Indication: Atrial fib Last office visit: 02/03/20  Myles Gip MD Scr: 1.90 on 04/25/21 Age: 88 Weight: 99.8kg  Based on above findings Eliquis 2.5mg  twice daily is the appropriate dose.  Pt is PAST DUE for appt with Dr Domenic Polite.  Message sent to scheduler to make appt.  Refill approved x 1.  No more refills till pt is seen.

## 2021-08-16 ENCOUNTER — Encounter (INDEPENDENT_AMBULATORY_CARE_PROVIDER_SITE_OTHER): Payer: Self-pay | Admitting: Gastroenterology

## 2021-08-16 ENCOUNTER — Ambulatory Visit (INDEPENDENT_AMBULATORY_CARE_PROVIDER_SITE_OTHER): Payer: Medicare Other | Admitting: Gastroenterology

## 2021-08-16 ENCOUNTER — Other Ambulatory Visit: Payer: Self-pay

## 2021-08-16 VITALS — BP 110/46 | HR 84 | Temp 99.2°F | Ht 74.0 in

## 2021-08-16 DIAGNOSIS — K5520 Angiodysplasia of colon without hemorrhage: Secondary | ICD-10-CM | POA: Diagnosis not present

## 2021-08-16 DIAGNOSIS — R112 Nausea with vomiting, unspecified: Secondary | ICD-10-CM | POA: Diagnosis not present

## 2021-08-16 DIAGNOSIS — D509 Iron deficiency anemia, unspecified: Secondary | ICD-10-CM | POA: Diagnosis not present

## 2021-08-16 DIAGNOSIS — G8929 Other chronic pain: Secondary | ICD-10-CM

## 2021-08-16 DIAGNOSIS — R1013 Epigastric pain: Secondary | ICD-10-CM | POA: Diagnosis not present

## 2021-08-16 MED ORDER — ONDANSETRON HCL 4 MG PO TABS: 4.0000 mg | ORAL_TABLET | Freq: Three times a day (TID) | ORAL | 3 refills | Status: DC | PRN

## 2021-08-16 NOTE — Patient Instructions (Addendum)
Schedule CT angio abdomen/pelvis with IV contrast Please make an appointment with cardiologist to evaluate shortness of breath and to obtain clearance for possible EGD Start Zofran as needed for nausea Continue Protonix every night. Perform blood workup Continue oral iron

## 2021-08-16 NOTE — Progress Notes (Signed)
[name], M.D. Gastroenterology & Hepatology [facility] [address] [place],  [address]  Primary Care Physician: [name], MD [address] [place] [address]  I will communicate my assessment and recommendations to the referring MD via EMR.  Problems: Chronic epigastric abdominal pain  History of Present Illness: Ronald Colon is a 88 y.o. 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  Patient comes to the appointment with his son and his daughter-in-law who help provide information.  The patient was last seen on 07/03/2021. At that time, the patient was complaining of persistent epigastric burning pain usually after eating.  He was advised to increase his pantoprazole to 40 mg twice a day and to restart Carafate.  However, the patient had insomnia related to the intake of Carafate and had to stop this medication he actually reports that he was only taking 40 mg of Protonix in the evening.  Patient reports that he has noticed recurrent episodes of burning abdominal pain in the epigastric area which radiates to his retrosternal area. Believes the symptoms started since September 2022. He reports that the pain starts after having breakfast. States that the pain stays during the day and goes away around midnight.  He has presented recurrent nausea once to two times a week and vomits food contents.He takes Mylanta three times a day, which somewhat helps decreasing the pain.  He has also presented shortness of breath a month ago. Las CXR in 04/2021 showed enlarged cardiac silhouette and chronic interstitial changes with bibasilar atelectasis and small left pleural effusion.  States that he has presented the shortness of breath with exertion  mostly, which improves after he rests.   He takes oral iron daily for many years due to history of AVMs and IDA. He does not like taking iron as it makes his stools black but he is compliant with the medication.  The patient denies having any nausea, vomiting, fever, chills, hematochezia, melena, hematemesis, abdominal distention, abdominal pain, diarrhea, jaundice, pruritus. Has lost 11 lb in the last 3 years.  Last EGD: Last Colonoscopy:  Past Medical History: Past Medical History:  Diagnosis Date   Aortic stenosis    Mild February 2018   Arthritis    Atrial fibrillation [facility])    Diagnosed January 2018   AVM (arteriovenous malformation)    Carotid stenosis    Diverticulosis    Essential hypertension    Gout    Hx of adenomatous colonic polyps    Hyperlipidemia    IDA (iron deficiency anemia)    Internal hemorrhoids    Rheumatic fever 1937   Stroke ([place]) 09/10/2016   Tubular adenoma of colon     Past Surgical History: Past Surgical History:  Procedure Laterality Date   CATARACT EXTRACTION W/PHACO Right 08/02/2014   Procedure: CATARACT EXTRACTION PHACO AND INTRAOCULAR LENS PLACEMENT; CDE:  12.77;  Surgeon: Williams Che, MD;  Location: [facility];  Service: Ophthalmology;  Laterality: Right;   CATARACT EXTRACTION W/PHACO Left 02/15/2015   Procedure: CATARACT EXTRACTION PHACO AND INTRAOCULAR LENS PLACEMENT (IOC);  Surgeon: Williams Che, MD;  Location: [facility];  Service: Ophthalmology;  Laterality: Left;  CDE 18.00   COLONOSCOPY     COLONOSCOPY WITH PROPOFOL N/A 12/13/2016   ileum normal, polyp in cecum, ascending colon-multiple, transverse colon, diverticulosis in  sigmoid descending and transverse, narrowing of colon in assoc with diverticular opening, internal hemorrhoids   ELBOW SURGERY Right    "grissle in there"   ENDARTERECTOMY Left 09/17/2016   Procedure: LEFT CAROTID ENDARTECTOMY;  Surgeon: Waynetta Sandy, MD;  Location: [place];  Service: Vascular;   Laterality: Left;   ESOPHAGEAL DILATION  09/03/2013   Procedure: ESOPHAGEAL DILATION;  Surgeon: Rogene Houston, MD;  Location: [facility];  Service: Endoscopy;;   ESOPHAGOGASTRODUODENOSCOPY N/A 09/03/2013   Procedure: ESOPHAGOGASTRODUODENOSCOPY (EGD);  Surgeon: Rogene Houston, MD;  [facility]: [facility];  Service: Endoscopy;  Laterality: N/A;  255-rescheduled to 1030 Ann to notify pt   ESOPHAGOGASTRODUODENOSCOPY (EGD) WITH PROPOFOL N/A 12/13/2016   normal, no specimens   GIVENS CAPSULE STUDY N/A 08/17/2017   AVMs   PATCH ANGIOPLASTY Left 09/17/2016   Procedure: PATCH ANGIOPLASTY WITH XENOSURE BIOLOGIC PATCH 1 CM X6 CM;  Surgeon: Waynetta Sandy, MD;  Location[facility];  Service: Vascular;  Laterality: Left;    Family History: Family History  Problem Relation Age of Onset   CVA Mother 77   Pneumonia Father 45   Colon cancer Neg Hx     Social History: Social History   Tobacco Use  Smoking Status Former   Packs/day: 1.00   Years: 20.00   Pack years: 20.00   Types: Cigarettes   Quit date: 1980   Years since quitting: 43.0  Smokeless Tobacco Never   Social History   Substance and Sexual Activity  Alcohol Use No   Social History   Substance and Sexual Activity  Drug Use No    Allergies: No Known Allergies  Medications: Current Outpatient Medications  Medication Sig Dispense Refill   acetaminophen (TYLENOL) 325 MG tablet Take 650 mg by mouth daily as needed for mild pain or headache.      allopurinol (ZYLOPRIM) 300 MG tablet Take 300 mg by mouth daily.     alum & mag hydroxide-simeth (MAALOX/MYLANTA) 200-200-20 MG/5ML suspension Take by mouth every 6 (six) hours as needed for indigestion or heartburn.     amiodarone (PACERONE) 200 MG tablet Take 0.5 tablets (100 mg total) by mouth daily. 45 tablet 3   apixaban (ELIQUIS) 2.5 MG TABS tablet TAKE 1 TABLET BY MOUTH TWICE A DAY 180 tablet 0   atorvastatin (LIPITOR) 40 MG tablet Take 1 tablet (40 mg total) by  mouth daily at 6 PM. 30 tablet 11   ezetimibe (ZETIA) 10 MG tablet Take 10 mg by mouth daily.     FeFum-FePoly-FA-B Cmp-C-Biot (FOLIVANE-PLUS) CAPS TAKE 1 CAPSULE BY MOUTH EVERY DAY IN THE MORNING 90 capsule 0   fish oil-omega-3 fatty acids 1000 MG capsule Take 1 g by mouth daily.     furosemide (LASIX) 20 MG tablet Take 20 mg by mouth daily.     metoprolol tartrate (LOPRESSOR) 25 MG tablet Take 0.5 tablets (12.5 mg total) by mouth 2 (two) times daily. 90 tablet 3   pantoprazole (PROTONIX) 40 MG tablet Take 1 tablet (40 mg total) by mouth 2 (two) times daily. 180 tablet 3   colchicine 0.6 MG tablet Take 0.6 mg by mouth 2 (two) times daily as needed (gout).  (Patient not taking: Reported on 08/16/2021)     sucralfate (CARAFATE) 1 GM/10ML suspension Take 10 mLs (1 g total) by mouth 4 (four) times daily -  with meals and at bedtime. (Patient not taking: Reported on 08/16/2021) 414 mL 0   No current facility-administered medications for this visit.  Review of Systems: GENERAL: negative for malaise, night sweats HEENT: No changes in hearing or vision, no nose bleeds or other nasal problems. NECK: Negative for lumps, goiter, pain and significant neck swelling RESPIRATORY: Negative for cough, wheezing CARDIOVASCULAR: Negative for chest pain, leg swelling, palpitations, orthopnea GI: SEE HPI MUSCULOSKELETAL: Negative for joint pain or swelling, back pain, and muscle pain. SKIN: Negative for lesions, rash PSYCH: Negative for sleep disturbance, mood disorder and recent psychosocial stressors. HEMATOLOGY Negative for prolonged bleeding, bruising easily, and swollen nodes. ENDOCRINE: Negative for cold or heat intolerance, polyuria, polydipsia and goiter. NEURO: negative for tremor, gait imbalance, syncope and seizures. The remainder of the review of systems is noncontributory.   Physical Exam: BP (!) 110/46 (BP Location: Left Arm, Patient Position: Sitting, Cuff Size: Large)    Pulse 84    Temp 99.2  F (37.3 C) (Oral)    Ht 6\' 2"  (1.88 m)    BMI 24.33 kg/m  GENERAL: The patient is AO x3, in no acute distress.  Sitting in wheelchair. HEENT: Head is normocephalic and atraumatic. EOMI are intact. Mouth is well hydrated and without lesions. NECK: Supple. No masses LUNGS: Clear to auscultation. No presence of rhonchi/wheezing/rales. Adequate chest expansion HEART: RRR, normal s1 and s2. ABDOMEN: Mildly tender upon palpation of the periumbilical area, no guarding, no peritoneal signs, and nondistended. BS +. No masses. EXTREMITIES: Without any cyanosis, clubbing, rash, lesions or edema. NEUROLOGIC: AOx3, no focal motor deficit. SKIN: no jaundice, no rashes  Imaging/Labs: as above  I personally reviewed and interpreted the available labs, imaging and endoscopic files.  Impression and Plan: Ronald Colon is a 88 y.o. 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  Patient has presented recurrent episodes of abdominal pain which are usually postprandial.  He denies having any improvement with the use of Carafate and did not follow the instruction to increase the Protonix to twice a day.  Given the fact that the pain has been elicited primarily with food intake, we will perform a CT angio to rule out chronic mesenteric ischemia.  He has presenting new onset shortness of breath which given his aortic stenosis, is concerning as it is usually elicited with exertion.  I asked the patient and the family to make an appointment with his cardiologist to evaluate his symptoms further and to obtain clearance in case he needs to have an EGD in the future.  He would benefit from taking Zofran for now as this may improve his nausea episodes and he needs to continue taking the Protonix as advised in the past.  In terms of his shortness of  breath, given his history of iron deficiency anemia, we will recheck his iron stores and CBC today but he will need to continue taking his iron indefinitely given the presence of AVMs in his small bowel.  - Schedule CT angio abdomen/pelvis with IV contrast - Please make an appointment with cardiologist to evaluate shortness of breath and to obtain clearance for possible EGD - Start Zofran as needed for nausea - Continue Protonix every night. - Check CBC and iron stores - Continue oral iron  All questions were answered.      Harvel Quale, MD Gastroenterology and Hepatology [facility]

## 2021-08-17 LAB — CBC WITH DIFFERENTIAL/PLATELET
Absolute Monocytes: 688 cells/uL (ref 200–950)
Basophils Absolute: 40 cells/uL (ref 0–200)
Basophils Relative: 0.5 %
Eosinophils Absolute: 56 cells/uL (ref 15–500)
Eosinophils Relative: 0.7 %
HCT: 36.8 % — ABNORMAL LOW (ref 38.5–50.0)
Hemoglobin: 12.4 g/dL — ABNORMAL LOW (ref 13.2–17.1)
Lymphs Abs: 720 cells/uL — ABNORMAL LOW (ref 850–3900)
MCH: 33.7 pg — ABNORMAL HIGH (ref 27.0–33.0)
MCHC: 33.7 g/dL (ref 32.0–36.0)
MCV: 100 fL (ref 80.0–100.0)
MPV: 13.4 fL — ABNORMAL HIGH (ref 7.5–12.5)
Monocytes Relative: 8.6 %
Neutro Abs: 6496 cells/uL (ref 1500–7800)
Neutrophils Relative %: 81.2 %
Platelets: 167 10*3/uL (ref 140–400)
RBC: 3.68 10*6/uL — ABNORMAL LOW (ref 4.20–5.80)
RDW: 15.4 % — ABNORMAL HIGH (ref 11.0–15.0)
Total Lymphocyte: 9 %
WBC: 8 10*3/uL (ref 3.8–10.8)

## 2021-08-17 LAB — IRON, TOTAL/TOTAL IRON BINDING CAP
%SAT: 12 % (calc) — ABNORMAL LOW (ref 20–48)
Iron: 41 ug/dL — ABNORMAL LOW (ref 50–180)
TIBC: 344 mcg/dL (calc) (ref 250–425)

## 2021-08-17 LAB — FERRITIN: Ferritin: 76 ng/mL (ref 24–380)

## 2021-08-21 ENCOUNTER — Other Ambulatory Visit (INDEPENDENT_AMBULATORY_CARE_PROVIDER_SITE_OTHER): Payer: Self-pay | Admitting: Gastroenterology

## 2021-08-21 ENCOUNTER — Telehealth (INDEPENDENT_AMBULATORY_CARE_PROVIDER_SITE_OTHER): Payer: Self-pay

## 2021-08-21 DIAGNOSIS — R112 Nausea with vomiting, unspecified: Secondary | ICD-10-CM

## 2021-08-21 MED ORDER — PROCHLORPERAZINE MALEATE 5 MG PO TABS: 5.0000 mg | ORAL_TABLET | Freq: Three times a day (TID) | ORAL | 2 refills | Status: AC | PRN

## 2021-08-21 NOTE — Telephone Encounter (Signed)
I sent compazine, please ask him to stop the Zofran but should only take compazine if nausea is severe

## 2021-08-21 NOTE — Telephone Encounter (Signed)
Patient aware of all.

## 2021-08-21 NOTE — Telephone Encounter (Signed)
Patient called today stating he was given ondansetron 4 mg Q 8 hours prn. He says the first time he took it, it seemed to help,but every time after that it caused him to have nausea and vomiting, with the last day of vomiting on Friday, when he stopped the medication. Patient states he takes Mylanta prn and Carafate caused him to have insomnia. If any new medication is called in for the patient he would like for it to be sent to [facility] on [address].

## 2021-08-25 DIAGNOSIS — E782 Mixed hyperlipidemia: Secondary | ICD-10-CM | POA: Diagnosis not present

## 2021-08-25 DIAGNOSIS — Z6824 Body mass index (BMI) 24.0-24.9, adult: Secondary | ICD-10-CM | POA: Diagnosis not present

## 2021-08-25 DIAGNOSIS — I714 Abdominal aortic aneurysm, without rupture, unspecified: Secondary | ICD-10-CM | POA: Diagnosis not present

## 2021-08-25 DIAGNOSIS — I10 Essential (primary) hypertension: Secondary | ICD-10-CM | POA: Diagnosis not present

## 2021-08-25 DIAGNOSIS — M1990 Unspecified osteoarthritis, unspecified site: Secondary | ICD-10-CM | POA: Diagnosis not present

## 2021-08-25 DIAGNOSIS — E663 Overweight: Secondary | ICD-10-CM | POA: Diagnosis not present

## 2021-08-25 DIAGNOSIS — R7301 Impaired fasting glucose: Secondary | ICD-10-CM | POA: Diagnosis not present

## 2021-08-25 DIAGNOSIS — Z1331 Encounter for screening for depression: Secondary | ICD-10-CM | POA: Diagnosis not present

## 2021-08-25 DIAGNOSIS — D509 Iron deficiency anemia, unspecified: Secondary | ICD-10-CM | POA: Diagnosis not present

## 2021-08-25 DIAGNOSIS — I6522 Occlusion and stenosis of left carotid artery: Secondary | ICD-10-CM | POA: Diagnosis not present

## 2021-08-25 DIAGNOSIS — M48061 Spinal stenosis, lumbar region without neurogenic claudication: Secondary | ICD-10-CM | POA: Diagnosis not present

## 2021-08-25 DIAGNOSIS — K219 Gastro-esophageal reflux disease without esophagitis: Secondary | ICD-10-CM | POA: Diagnosis not present

## 2021-09-05 ENCOUNTER — Encounter: Payer: Self-pay | Admitting: *Deleted

## 2021-09-05 ENCOUNTER — Other Ambulatory Visit: Payer: Self-pay

## 2021-09-05 ENCOUNTER — Encounter: Payer: Self-pay | Admitting: Student

## 2021-09-05 ENCOUNTER — Ambulatory Visit (INDEPENDENT_AMBULATORY_CARE_PROVIDER_SITE_OTHER): Payer: Medicare Other | Admitting: Student

## 2021-09-05 VITALS — BP 122/80 | HR 77 | Ht 74.0 in | Wt 194.8 lb

## 2021-09-05 DIAGNOSIS — I480 Paroxysmal atrial fibrillation: Secondary | ICD-10-CM

## 2021-09-05 DIAGNOSIS — I350 Nonrheumatic aortic (valve) stenosis: Secondary | ICD-10-CM

## 2021-09-05 DIAGNOSIS — I5032 Chronic diastolic (congestive) heart failure: Secondary | ICD-10-CM

## 2021-09-05 DIAGNOSIS — R0609 Other forms of dyspnea: Secondary | ICD-10-CM | POA: Diagnosis not present

## 2021-09-05 DIAGNOSIS — I6522 Occlusion and stenosis of left carotid artery: Secondary | ICD-10-CM

## 2021-09-05 NOTE — Progress Notes (Signed)
Cardiology Office Note    Date:  09/05/2021   ID:  Ronald Colon, Ronald Colon Aug 21, 1928, MRN [id number]  PCP:  [name], MD  Cardiologist: [name], MD    Chief Complaint  Patient presents with   Follow-up    Overdue follow-up; Worsening dyspnea on exertion    History of Present Illness:    Ronald Colon is a 88 y.o. 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   Was last examined in person by Dr. Domenic Polite in 03/2018 and echocardiogram at that time showed his EF was mildly reduced at 45-50% with mild to moderate AS. He did have a telehealth visit in 01/2020 and reported doing well and denied any recent anginal symptoms. Was continued on Eliquis, Amiodarone and Lopressor with plans for a follow-up echocardiogram and visit in 3 months but he has not been evaluated since.   In talking with the patient, his son and his daughter-in-law today, he reports having worsening dyspnea on exertion for the past 6+ months.  Denies any associated chest pain or palpitations.  Does report feeling an occasional "flutter" while at rest but denies any persistent symptoms. He does not routinely check his heart rate at home. No recent orthopnea, PND or lower extremity edema. He says his weight has been stable and he does take Lasix on a daily basis. Does report abdominal pain worse with food consumption and is being followed by GI with plans for a CT next month.   Family members report he is mostly sedentary and sits in a recliner during the day. They help with all of the grocery shopping and household chores.The patient does report he enjoys hunting from his bathroom window.  Past Medical History:  Diagnosis Date   Aortic stenosis    Mild February 2018   Arthritis    Atrial fibrillation  [facility])    Diagnosed January 2018   AVM (arteriovenous malformation)    Carotid stenosis    Diverticulosis    Essential hypertension    Gout    Hx of adenomatous colonic polyps    Hyperlipidemia    IDA (iron deficiency anemia)    Internal hemorrhoids    Rheumatic fever 1937   Stroke ([place]) 09/10/2016   Tubular adenoma of colon     Past Surgical History:  Procedure Laterality Date   CATARACT EXTRACTION W/PHACO Right 08/02/2014   Procedure: CATARACT EXTRACTION PHACO AND INTRAOCULAR LENS PLACEMENT; CDE:  12.77;  Surgeon: Williams Che, MD;  Location: [facility];  Service: Ophthalmology;  Laterality: Right;   CATARACT EXTRACTION W/PHACO Left 02/15/2015   Procedure: CATARACT EXTRACTION PHACO AND INTRAOCULAR LENS PLACEMENT (IOC);  Surgeon: Williams Che, MD;  Location: [facility];  Service: Ophthalmology;  Laterality: Left;  CDE 18.00   COLONOSCOPY     COLONOSCOPY WITH PROPOFOL N/A 12/13/2016   ileum normal, polyp in cecum, ascending colon-multiple, transverse colon, diverticulosis in sigmoid descending and transverse, narrowing of colon in assoc with diverticular opening, internal hemorrhoids   ELBOW SURGERY Right    "grissle in there"   ENDARTERECTOMY Left 09/17/2016   Procedure: LEFT CAROTID ENDARTECTOMY;  Surgeon: Waynetta Sandy, MD;  Location: [facility];  Service: Vascular;  Laterality: Left;   ESOPHAGEAL DILATION  09/03/2013   Procedure: ESOPHAGEAL DILATION;  Surgeon: Rogene Houston,  MD;  Location: [facility];  Service: Endoscopy;;   ESOPHAGOGASTRODUODENOSCOPY N/A 09/03/2013   Procedure: ESOPHAGOGASTRODUODENOSCOPY (EGD);  Surgeon: Rogene Houston, MD;  Location: [facility];  Service: Endoscopy;  Laterality: N/A;  255-rescheduled to 1030 Ann to notify pt   ESOPHAGOGASTRODUODENOSCOPY (EGD) WITH PROPOFOL N/A 12/13/2016   normal, no specimens   GIVENS CAPSULE STUDY N/A 08/17/2017   AVMs   PATCH ANGIOPLASTY Left 09/17/2016   Procedure: PATCH ANGIOPLASTY WITH Rueben Bash BIOLOGIC  PATCH 1 CM X6 CM;  Surgeon: Waynetta Sandy, MD;  Location: [facility];  Service: Vascular;  Laterality: Left;    Current Medications: Outpatient Medications Prior to Visit  Medication Sig Dispense Refill   acetaminophen (TYLENOL) 325 MG tablet Take 650 mg by mouth daily as needed for mild pain or headache.      allopurinol (ZYLOPRIM) 300 MG tablet Take 300 mg by mouth daily.     alum & mag hydroxide-simeth (MAALOX/MYLANTA) 200-200-20 MG/5ML suspension Take by mouth every 6 (six) hours as needed for indigestion or heartburn.     amiodarone (PACERONE) 200 MG tablet Take 0.5 tablets (100 mg total) by mouth daily. 45 tablet 3   apixaban (ELIQUIS) 2.5 MG TABS tablet TAKE 1 TABLET BY MOUTH TWICE A DAY 180 tablet 0   atorvastatin (LIPITOR) 40 MG tablet Take 1 tablet (40 mg total) by mouth daily at 6 PM. 30 tablet 11   ezetimibe (ZETIA) 10 MG tablet Take 10 mg by mouth daily.     FeFum-FePoly-FA-B Cmp-C-Biot (FOLIVANE-PLUS) CAPS TAKE 1 CAPSULE BY MOUTH EVERY DAY IN THE MORNING 90 capsule 0   fish oil-omega-3 fatty acids 1000 MG capsule Take 1 g by mouth daily.     furosemide (LASIX) 20 MG tablet Take 20 mg by mouth daily.     metoprolol tartrate (LOPRESSOR) 25 MG tablet Take 0.5 tablets (12.5 mg total) by mouth 2 (two) times daily. 90 tablet 3   pantoprazole (PROTONIX) 40 MG tablet Take 1 tablet (40 mg total) by mouth 2 (two) times daily. (Patient taking differently: Take by mouth daily.) 180 tablet 3   prochlorperazine (COMPAZINE) 5 MG tablet Take 1 tablet (5 mg total) by mouth every 8 (eight) hours as needed for nausea or vomiting. 30 tablet 2   colchicine 0.6 MG tablet Take 0.6 mg by mouth 2 (two) times daily as needed (gout). (Patient not taking: Reported on 09/05/2021)     ondansetron (ZOFRAN) 4 MG tablet Take 1 tablet (4 mg total) by mouth every 8 (eight) hours as needed for nausea or vomiting. (Patient not taking: Reported on 09/05/2021) 30 tablet 3   sucralfate (CARAFATE) 1 GM/10ML suspension  Take 10 mLs (1 g total) by mouth 4 (four) times daily -  with meals and at bedtime. (Patient not taking: Reported on 08/16/2021) 414 mL 0   No facility-administered medications prior to visit.     Allergies:   Patient has no known allergies.   Social History   Socioeconomic History   Marital status: Married    Spouse name: Not on file   Number of children: 1   Years of education: Not on file   Highest education level: Not on file  Occupational History   Occupation: Retired  Tobacco Use   Smoking status: Former    Packs/day: 1.00    Years: 20.00    Pack years: 20.00    Types: Cigarettes    Quit date: 1980    Years since quitting: 43.0   Smokeless tobacco: Never  Vaping Use   Vaping Use: Never used  Substance and Sexual Activity   Alcohol use: No   Drug use: No   Sexual activity: Not on file  Other Topics Concern   Not on file  Social History Narrative   Not on file   Social Determinants of Health   Financial Resource Strain: Not on file  Food Insecurity: Not on file  Transportation Needs: Not on file  Physical Activity: Not on file  Stress: Not on file  Social Connections: Not on file     Family History:  The patient's family history includes CVA (age of onset: 49) in his mother; Pneumonia (age of onset: 64) in his father.   Review of Systems:    Please see the history of present illness.     All other systems reviewed and are otherwise negative except as noted above.   Physical Exam:    VS:  BP 122/80    Pulse 77    Ht 6\' 2"  (1.88 m)    Wt 194 lb 12.8 oz (88.4 kg)    SpO2 98%    BMI 25.01 kg/m    General: Pleasant elderly male appearing in no acute distress. Head: Normocephalic, atraumatic. Neck: No carotid bruits. JVD not elevated.  Lungs: Respirations regular and unlabored, without wheezes or rales.  Heart: Irregularly irregular. No S3 or S4.  No rubs or gallops. 2/6 SEM along RUSB.  Abdomen: Appears non-distended. No obvious abdominal masses. Msk:   Strength and tone appear normal for age. No obvious joint deformities or effusions. Extremities: No clubbing or cyanosis. No lower extremity edema.  Distal pedal pulses are 2+ bilaterally. Neuro: Alert and oriented X 3. Moves all extremities spontaneously. No focal deficits noted. Psych:  Responds to questions appropriately with a normal affect. Skin: No rashes or lesions noted  Wt Readings from Last 3 Encounters:  09/05/21 194 lb 12.8 oz (88.4 kg)  07/03/21 189 lb 8 oz (86 kg)  02/03/20 220 lb (99.8 kg)     Studies/Labs Reviewed:   EKG:  EKG is ordered today. The ekg ordered today demonstrates rate-controlled atrial flutter, heart rate 67 with known LBBB.  Recent Labs: 08/16/2021: Hemoglobin 12.4; Platelets 167   Lipid Panel    Component Value Date/Time   CHOL 178 09/11/2016 0252   TRIG 139 09/11/2016 0252   HDL 37 (L) 09/11/2016 0252   CHOLHDL 4.8 09/11/2016 0252   VLDL 28 09/11/2016 0252   LDLCALC 113 (H) 09/11/2016 0252    Additional studies/ records that were reviewed today include:   Echocardiogram: 08/2017 Study Conclusions   - Left ventricle: The cavity size was normal. Wall thickness was    increased in a pattern of mild LVH. Systolic function was mildly    reduced. The estimated ejection fraction was in the range of 45%    to 50%. Diffuse hypokinesis. Features are consistent with a    pseudonormal left ventricular filling pattern, with concomitant    abnormal relaxation and increased filling pressure (grade 2    diastolic dysfunction). Doppler parameters are consistent with    high ventricular filling pressure.  - Aortic valve: Moderately calcified annulus. Trileaflet;    moderately thickened leaflets. There was mild to moderate    stenosis. There was mild regurgitation. Valve area (VTI): 1.35    cm^2. Valve area (Vmax): 1.45 cm^2. Valve area (Vmean): 1.15    cm^2.  - Mitral valve: There was mild regurgitation.  - Left atrium: The atrium was moderately  dilated.  - Right ventricle: The cavity size was mildly dilated.  - Right atrium: The atrium was mildly dilated.  - Atrial septum: No defect or patent foramen ovale was identified.  - Pulmonary arteries: Systolic pressure was moderately to severely    increased. PA peak pressure: 67 mm Hg (S).   Assessment:    1. Dyspnea on exertion   2. Chronic heart failure with preserved ejection fraction (HCC)   3. Paroxysmal atrial fibrillation ([place])   4. Nonrheumatic aortic valve stenosis   5. Stenosis of left carotid artery      Plan:   In order of problems listed above:  1. Dyspnea on Exertion - He does report worsening dyspnea on exertion for the past 6 months but denies any associated chest pain or palpitations. It is possible his symptoms are secondary to deconditioning as he is mostly in his recliner throughout the day and sedentary. Recent labs by GI showed his hemoglobin was stable at 12.4 and he does report having recent labs with his PCP as well which we will request a copy of.  - Will plan to obtain a repeat echocardiogram for reassessment of his EF and aortic stenosis. He is being followed by GI with plans for Abdominal CT and possible EGD. I did recommend that we obtain his echocardiogram for clearance prior to proceeding with EGD as this will help guide his risk stratification.  2. HFmrEF - His EF was mildly reduced at 45-50% by echo in 2019. He denies any recent orthopnea, PND or pitting edema. He remains on Lopressor 12.5 mg twice daily and Lasix 20 mg daily. Can consider further medication adjustments pending echo results.  3. Paroxysmal Atrial Fibrillation/Flutter - He has a history of this and is in rate-controlled atrial flutter by EKG today. Will continue Lopressor 12.5 mg twice daily for rate control and also Amiodarone 100 mg daily. If TSH and LFT's not recently checked by his PCP, would plan to obtain.  - Denies any evidence of active bleeding. Remains on Eliquis 2.5 mg  twice daily for anticoagulation. He does report having recent labs with his PCP and I will request a copy of these. He reports he has been on the lower dose of Eliquis given a prior ulcer with higher dosing in the past and due to his AVM's.  4. Aortic stenosis - This was mild to moderate by echocardiogram in 08/2017. Will plan for repeat imaging as outlined above.  5. Carotid Artery Stenosis - He is s/p L CEA in 2019. Previously followed by Vascular Surgery but no recent visits within the past few years. If he has not followed up with them in the interim, would plan to schedule repeat dopplers at his next appointment.    Medication Adjustments/Labs and Tests Ordered: Current medicines are reviewed at length with the patient today.  Concerns regarding medicines are outlined above.  Medication changes, Labs and Tests ordered today are listed in the Patient Instructions below. Patient Instructions  Medication Instructions:  Your physician recommends that you continue on your current medications as directed. Please refer to the Current Medication list given to you today.  *If you need a refill on your cardiac medications before your next appointment, please call your pharmacy*   If you have labs (blood work) drawn today and your tests are completely normal, you will receive your results only by: [place] (if you have MyChart) OR A paper copy in the mail If you have any lab test that is abnormal  or we need to change your treatment, we will call you to review the results.   Testing/Procedures: Your physician has requested that you have an echocardiogram. Echocardiography is a painless test that uses sound waves to create images of your heart. It provides your doctor with information about the size and shape of your heart and how well your hearts chambers and valves are working. This procedure takes approximately one hour. There are no restrictions for this procedure.    Follow-Up: At  [facility], you and your health needs are our priority.  As part of our continuing mission to provide you with exceptional heart care, we have created designated Provider Care Teams.  These Care Teams include your primary Cardiologist (physician) and Advanced Practice Providers (APPs -  Physician Assistants and Nurse Practitioners) who all work together to provide you with the care you need, when you need it.  We recommend signing up for the patient portal called "MyChart".  Sign up information is provided on this After Visit Summary.  MyChart is used to connect with patients for Virtual Visits (Telemedicine).  Patients are able to view lab/test results, encounter notes, upcoming appointments, etc.  Non-urgent messages can be sent to your provider as well.   To learn more about what you can do with MyChart, go to [email or web address].    Your next appointment:   6 month(s)  The format for your next appointment:   In Person  Provider:   [name], MD    Other Instructions Thank you for choosing [facility]!      Signed, Erma Heritage, PA-C  09/05/2021 4:37 PM    [facility] [address] [place], [place] [address] Phone: [phone] Fax: [phone]

## 2021-09-05 NOTE — Patient Instructions (Signed)
Medication Instructions:  Your physician recommends that you continue on your current medications as directed. Please refer to the Current Medication list given to you today.  *If you need a refill on your cardiac medications before your next appointment, please call your pharmacy*   If you have labs (blood work) drawn today and your tests are completely normal, you will receive your results only by: [place] (if you have MyChart) OR A paper copy in the mail If you have any lab test that is abnormal or we need to change your treatment, we will call you to review the results.   Testing/Procedures: Your physician has requested that you have an echocardiogram. Echocardiography is a painless test that uses sound waves to create images of your heart. It provides your doctor with information about the size and shape of your heart and how well your hearts chambers and valves are working. This procedure takes approximately one hour. There are no restrictions for this procedure.    Follow-Up: At [facility], you and your health needs are our priority.  As part of our continuing mission to provide you with exceptional heart care, we have created designated Provider Care Teams.  These Care Teams include your primary Cardiologist (physician) and Advanced Practice Providers (APPs -  Physician Assistants and Nurse Practitioners) who all work together to provide you with the care you need, when you need it.  We recommend signing up for the patient portal called "MyChart".  Sign up information is provided on this After Visit Summary.  MyChart is used to connect with patients for Virtual Visits (Telemedicine).  Patients are able to view lab/test results, encounter notes, upcoming appointments, etc.  Non-urgent messages can be sent to your provider as well.   To learn more about what you can do with MyChart, go to [email or web address].    Your next appointment:   6 month(s)  The format for your  next appointment:   In Person  Provider:   [name], MD    Other Instructions Thank you for choosing [facility]!

## 2021-09-10 ENCOUNTER — Other Ambulatory Visit: Payer: Self-pay

## 2021-09-10 ENCOUNTER — Emergency Department (HOSPITAL_COMMUNITY): Payer: Medicare Other

## 2021-09-10 ENCOUNTER — Inpatient Hospital Stay (HOSPITAL_COMMUNITY)
Admission: EM | Admit: 2021-09-10 | Discharge: 2021-09-15 | DRG: 391 | Disposition: A | Discharge status: Home Health | Payer: Medicare Other | Attending: Student | Admitting: Student

## 2021-09-10 ENCOUNTER — Encounter (HOSPITAL_COMMUNITY): Payer: Self-pay

## 2021-09-10 DIAGNOSIS — R9431 Abnormal electrocardiogram [ECG] [EKG]: Secondary | ICD-10-CM | POA: Diagnosis present

## 2021-09-10 DIAGNOSIS — K5792 Diverticulitis of intestine, part unspecified, without perforation or abscess without bleeding: Principal | ICD-10-CM

## 2021-09-10 DIAGNOSIS — K7460 Unspecified cirrhosis of liver: Secondary | ICD-10-CM | POA: Diagnosis present

## 2021-09-10 DIAGNOSIS — Z20822 Contact with and (suspected) exposure to covid-19: Secondary | ICD-10-CM | POA: Diagnosis present

## 2021-09-10 DIAGNOSIS — R109 Unspecified abdominal pain: Secondary | ICD-10-CM | POA: Diagnosis not present

## 2021-09-10 DIAGNOSIS — K7030 Alcoholic cirrhosis of liver without ascites: Secondary | ICD-10-CM | POA: Diagnosis not present

## 2021-09-10 DIAGNOSIS — I5021 Acute systolic (congestive) heart failure: Secondary | ICD-10-CM

## 2021-09-10 DIAGNOSIS — N189 Chronic kidney disease, unspecified: Secondary | ICD-10-CM | POA: Diagnosis not present

## 2021-09-10 DIAGNOSIS — K219 Gastro-esophageal reflux disease without esophagitis: Secondary | ICD-10-CM | POA: Diagnosis not present

## 2021-09-10 DIAGNOSIS — D696 Thrombocytopenia, unspecified: Secondary | ICD-10-CM | POA: Diagnosis not present

## 2021-09-10 DIAGNOSIS — Z87891 Personal history of nicotine dependence: Secondary | ICD-10-CM

## 2021-09-10 DIAGNOSIS — I130 Hypertensive heart and chronic kidney disease with heart failure and stage 1 through stage 4 chronic kidney disease, or unspecified chronic kidney disease: Secondary | ICD-10-CM | POA: Diagnosis present

## 2021-09-10 DIAGNOSIS — E8809 Other disorders of plasma-protein metabolism, not elsewhere classified: Secondary | ICD-10-CM | POA: Diagnosis not present

## 2021-09-10 DIAGNOSIS — Z79899 Other long term (current) drug therapy: Secondary | ICD-10-CM

## 2021-09-10 DIAGNOSIS — K317 Polyp of stomach and duodenum: Secondary | ICD-10-CM | POA: Diagnosis not present

## 2021-09-10 DIAGNOSIS — I5043 Acute on chronic combined systolic (congestive) and diastolic (congestive) heart failure: Secondary | ICD-10-CM | POA: Diagnosis not present

## 2021-09-10 DIAGNOSIS — I4891 Unspecified atrial fibrillation: Secondary | ICD-10-CM | POA: Diagnosis not present

## 2021-09-10 DIAGNOSIS — M109 Gout, unspecified: Secondary | ICD-10-CM | POA: Diagnosis present

## 2021-09-10 DIAGNOSIS — E875 Hyperkalemia: Secondary | ICD-10-CM | POA: Diagnosis present

## 2021-09-10 DIAGNOSIS — D539 Nutritional anemia, unspecified: Secondary | ICD-10-CM | POA: Diagnosis not present

## 2021-09-10 DIAGNOSIS — H9190 Unspecified hearing loss, unspecified ear: Secondary | ICD-10-CM | POA: Diagnosis present

## 2021-09-10 DIAGNOSIS — R718 Other abnormality of red blood cells: Secondary | ICD-10-CM

## 2021-09-10 DIAGNOSIS — I429 Cardiomyopathy, unspecified: Secondary | ICD-10-CM | POA: Diagnosis present

## 2021-09-10 DIAGNOSIS — R112 Nausea with vomiting, unspecified: Secondary | ICD-10-CM | POA: Diagnosis not present

## 2021-09-10 DIAGNOSIS — Z7901 Long term (current) use of anticoagulants: Secondary | ICD-10-CM

## 2021-09-10 DIAGNOSIS — Z9841 Cataract extraction status, right eye: Secondary | ICD-10-CM

## 2021-09-10 DIAGNOSIS — Z66 Do not resuscitate: Secondary | ICD-10-CM | POA: Diagnosis not present

## 2021-09-10 DIAGNOSIS — I083 Combined rheumatic disorders of mitral, aortic and tricuspid valves: Secondary | ICD-10-CM | POA: Diagnosis present

## 2021-09-10 DIAGNOSIS — E785 Hyperlipidemia, unspecified: Secondary | ICD-10-CM | POA: Diagnosis present

## 2021-09-10 DIAGNOSIS — R110 Nausea: Secondary | ICD-10-CM | POA: Diagnosis not present

## 2021-09-10 DIAGNOSIS — J9601 Acute respiratory failure with hypoxia: Secondary | ICD-10-CM

## 2021-09-10 DIAGNOSIS — R079 Chest pain, unspecified: Secondary | ICD-10-CM | POA: Diagnosis not present

## 2021-09-10 DIAGNOSIS — Z86010 Personal history of colonic polyps: Secondary | ICD-10-CM

## 2021-09-10 DIAGNOSIS — I8510 Secondary esophageal varices without bleeding: Secondary | ICD-10-CM | POA: Diagnosis present

## 2021-09-10 DIAGNOSIS — J90 Pleural effusion, not elsewhere classified: Secondary | ICD-10-CM | POA: Diagnosis not present

## 2021-09-10 DIAGNOSIS — R1013 Epigastric pain: Secondary | ICD-10-CM | POA: Diagnosis not present

## 2021-09-10 DIAGNOSIS — I4510 Unspecified right bundle-branch block: Secondary | ICD-10-CM | POA: Diagnosis present

## 2021-09-10 DIAGNOSIS — R932 Abnormal findings on diagnostic imaging of liver and biliary tract: Secondary | ICD-10-CM | POA: Diagnosis present

## 2021-09-10 DIAGNOSIS — E663 Overweight: Secondary | ICD-10-CM | POA: Diagnosis present

## 2021-09-10 DIAGNOSIS — N179 Acute kidney failure, unspecified: Secondary | ICD-10-CM | POA: Diagnosis present

## 2021-09-10 DIAGNOSIS — I4819 Other persistent atrial fibrillation: Secondary | ICD-10-CM | POA: Diagnosis present

## 2021-09-10 DIAGNOSIS — Z7189 Other specified counseling: Secondary | ICD-10-CM | POA: Diagnosis not present

## 2021-09-10 DIAGNOSIS — R1010 Upper abdominal pain, unspecified: Secondary | ICD-10-CM | POA: Diagnosis not present

## 2021-09-10 DIAGNOSIS — Z8673 Personal history of transient ischemic attack (TIA), and cerebral infarction without residual deficits: Secondary | ICD-10-CM

## 2021-09-10 DIAGNOSIS — R531 Weakness: Secondary | ICD-10-CM | POA: Diagnosis not present

## 2021-09-10 DIAGNOSIS — Z6825 Body mass index (BMI) 25.0-25.9, adult: Secondary | ICD-10-CM

## 2021-09-10 DIAGNOSIS — J338 Other polyp of sinus: Secondary | ICD-10-CM | POA: Diagnosis not present

## 2021-09-10 DIAGNOSIS — K311 Adult hypertrophic pyloric stenosis: Secondary | ICD-10-CM | POA: Diagnosis present

## 2021-09-10 DIAGNOSIS — J9691 Respiratory failure, unspecified with hypoxia: Secondary | ICD-10-CM | POA: Diagnosis present

## 2021-09-10 DIAGNOSIS — I447 Left bundle-branch block, unspecified: Secondary | ICD-10-CM | POA: Diagnosis present

## 2021-09-10 DIAGNOSIS — R0902 Hypoxemia: Secondary | ICD-10-CM | POA: Diagnosis not present

## 2021-09-10 DIAGNOSIS — K2970 Gastritis, unspecified, without bleeding: Principal | ICD-10-CM | POA: Diagnosis present

## 2021-09-10 DIAGNOSIS — K5732 Diverticulitis of large intestine without perforation or abscess without bleeding: Secondary | ICD-10-CM | POA: Diagnosis present

## 2021-09-10 DIAGNOSIS — R5381 Other malaise: Secondary | ICD-10-CM | POA: Diagnosis not present

## 2021-09-10 DIAGNOSIS — Z823 Family history of stroke: Secondary | ICD-10-CM

## 2021-09-10 DIAGNOSIS — R54 Age-related physical debility: Secondary | ICD-10-CM | POA: Diagnosis present

## 2021-09-10 DIAGNOSIS — R0600 Dyspnea, unspecified: Secondary | ICD-10-CM

## 2021-09-10 DIAGNOSIS — I482 Chronic atrial fibrillation, unspecified: Secondary | ICD-10-CM | POA: Diagnosis present

## 2021-09-10 DIAGNOSIS — Z515 Encounter for palliative care: Secondary | ICD-10-CM

## 2021-09-10 DIAGNOSIS — Z9842 Cataract extraction status, left eye: Secondary | ICD-10-CM

## 2021-09-10 DIAGNOSIS — I517 Cardiomegaly: Secondary | ICD-10-CM | POA: Diagnosis not present

## 2021-09-10 DIAGNOSIS — K2950 Unspecified chronic gastritis without bleeding: Secondary | ICD-10-CM | POA: Diagnosis not present

## 2021-09-10 DIAGNOSIS — I350 Nonrheumatic aortic (valve) stenosis: Secondary | ICD-10-CM | POA: Diagnosis not present

## 2021-09-10 DIAGNOSIS — I480 Paroxysmal atrial fibrillation: Secondary | ICD-10-CM | POA: Diagnosis not present

## 2021-09-10 DIAGNOSIS — R0602 Shortness of breath: Secondary | ICD-10-CM | POA: Diagnosis not present

## 2021-09-10 DIAGNOSIS — K5900 Constipation, unspecified: Secondary | ICD-10-CM | POA: Diagnosis present

## 2021-09-10 DIAGNOSIS — I8500 Esophageal varices without bleeding: Secondary | ICD-10-CM | POA: Diagnosis not present

## 2021-09-10 DIAGNOSIS — R0609 Other forms of dyspnea: Secondary | ICD-10-CM | POA: Diagnosis not present

## 2021-09-10 DIAGNOSIS — Z961 Presence of intraocular lens: Secondary | ICD-10-CM | POA: Diagnosis present

## 2021-09-10 LAB — CBC WITH DIFFERENTIAL/PLATELET
Abs Immature Granulocytes: 0.01 10*3/uL (ref 0.00–0.07)
Basophils Absolute: 0 10*3/uL (ref 0.0–0.1)
Basophils Relative: 1 %
Eosinophils Absolute: 0.2 10*3/uL (ref 0.0–0.5)
Eosinophils Relative: 3 %
HCT: 41.4 % (ref 39.0–52.0)
Hemoglobin: 13.3 g/dL (ref 13.0–17.0)
Immature Granulocytes: 0 %
Lymphocytes Relative: 12 %
Lymphs Abs: 0.7 10*3/uL (ref 0.7–4.0)
MCH: 32.8 pg (ref 26.0–34.0)
MCHC: 32.1 g/dL (ref 30.0–36.0)
MCV: 102.2 fL — ABNORMAL HIGH (ref 80.0–100.0)
Monocytes Absolute: 0.6 10*3/uL (ref 0.1–1.0)
Monocytes Relative: 10 %
Neutro Abs: 4.6 10*3/uL (ref 1.7–7.7)
Neutrophils Relative %: 74 %
Platelets: 140 10*3/uL — ABNORMAL LOW (ref 150–400)
RBC: 4.05 MIL/uL — ABNORMAL LOW (ref 4.22–5.81)
RDW: 16.7 % — ABNORMAL HIGH (ref 11.5–15.5)
WBC: 6.2 10*3/uL (ref 4.0–10.5)
nRBC: 0.3 % — ABNORMAL HIGH (ref 0.0–0.2)

## 2021-09-10 LAB — COMPREHENSIVE METABOLIC PANEL
ALT: 28 U/L (ref 0–44)
AST: 42 U/L — ABNORMAL HIGH (ref 15–41)
Albumin: 3.4 g/dL — ABNORMAL LOW (ref 3.5–5.0)
Alkaline Phosphatase: 84 U/L (ref 38–126)
Anion gap: 5 (ref 5–15)
BUN: 45 mg/dL — ABNORMAL HIGH (ref 8–23)
CO2: 24 mmol/L (ref 22–32)
Calcium: 9 mg/dL (ref 8.9–10.3)
Chloride: 108 mmol/L (ref 98–111)
Creatinine, Ser: 2.33 mg/dL — ABNORMAL HIGH (ref 0.61–1.24)
GFR, Estimated: 26 mL/min — ABNORMAL LOW (ref >60)
Glucose, Bld: 117 mg/dL — ABNORMAL HIGH (ref 70–99)
Potassium: 4.3 mmol/L (ref 3.5–5.1)
Sodium: 137 mmol/L (ref 135–145)
Total Bilirubin: 1.5 mg/dL — ABNORMAL HIGH (ref 0.3–1.2)
Total Protein: 6.7 g/dL (ref 6.5–8.1)

## 2021-09-10 LAB — TROPONIN I (HIGH SENSITIVITY)
Troponin I (High Sensitivity): 16 ng/L (ref <18)
Troponin I (High Sensitivity): 16 ng/L (ref <18)

## 2021-09-10 LAB — URINALYSIS, ROUTINE W REFLEX MICROSCOPIC
Bilirubin Urine: NEGATIVE
Glucose, UA: NEGATIVE mg/dL
Hgb urine dipstick: NEGATIVE
Ketones, ur: NEGATIVE mg/dL
Leukocytes,Ua: NEGATIVE
Nitrite: NEGATIVE
Protein, ur: NEGATIVE mg/dL
Specific Gravity, Urine: 1.02 (ref 1.005–1.030)
pH: 5.5 (ref 5.0–8.0)

## 2021-09-10 LAB — LIPASE, BLOOD: Lipase: 49 U/L (ref 11–51)

## 2021-09-10 LAB — LACTIC ACID, PLASMA
Lactic Acid, Venous: 1.4 mmol/L (ref 0.5–1.9)
Lactic Acid, Venous: 1.7 mmol/L (ref 0.5–1.9)

## 2021-09-10 MED ORDER — ONDANSETRON HCL 4 MG/2ML IJ SOLN
4.0000 mg | Freq: Once | INTRAMUSCULAR | Status: AC
  Administered 2021-09-10: 4 mg via INTRAVENOUS
  Filled 2021-09-10: qty 2

## 2021-09-10 MED ORDER — FENTANYL CITRATE PF 50 MCG/ML IJ SOSY
50.0000 ug | PREFILLED_SYRINGE | Freq: Once | INTRAMUSCULAR | Status: AC
  Administered 2021-09-10: 50 ug via INTRAVENOUS
  Filled 2021-09-10: qty 1

## 2021-09-10 MED ORDER — SODIUM CHLORIDE 0.9 % IV BOLUS
500.0000 mL | Freq: Once | INTRAVENOUS | Status: AC
  Administered 2021-09-10: 500 mL via INTRAVENOUS

## 2021-09-10 MED ORDER — PIPERACILLIN-TAZOBACTAM 3.375 G IVPB
3.3750 g | Freq: Three times a day (TID) | INTRAVENOUS | Status: DC
  Administered 2021-09-10 – 2021-09-13 (×8): 3.375 g via INTRAVENOUS
  Filled 2021-09-10 (×8): qty 50

## 2021-09-10 NOTE — ED Notes (Signed)
Pt's O2 was 76% on RA. Pt placed on 3lpm [place]. O2 increased to 95%

## 2021-09-10 NOTE — ED Provider Notes (Signed)
[facility] EMERGENCY DEPARTMENT Provider Note   CSN: [id number] Arrival date & time: 09/10/21  3295     History  Chief Complaint  Patient presents with   Abdominal Pain    Ronald Colon is a 88 y.o. male.  HPI 88 year old male presents with abdominal pain.  He has been dealing with pain like this for several months and has seen GI and is due for an EGD at some point.  Patient states he is on Protonix and Maalox but he still getting pain nearly every day.  It is worse after he is eating.  He has had a lot of vomiting.  Some diarrhea for months.  He is also been short of breath for months.  No fevers or chest pain though there is a lot of burning in his upper abdomen.  Son at the bedside notes that today he was complaining of his pain even worse than typical which is why EMS was called.  Home Medications Prior to Admission medications   Medication Sig Start Date End Date Taking? Authorizing Provider  acetaminophen (TYLENOL) 325 MG tablet Take 650 mg by mouth daily as needed for mild pain or headache.    Yes [provider]  allopurinol (ZYLOPRIM) 300 MG tablet Take 300 mg by mouth daily.   Yes [provider]  alum & mag hydroxide-simeth (MAALOX/MYLANTA) 200-200-20 MG/5ML suspension Take by mouth every 6 (six) hours as needed for indigestion or heartburn.   Yes [provider]  amiodarone (PACERONE) 200 MG tablet Take 0.5 tablets (100 mg total) by mouth daily. 05/07/17  Yes Satira Sark, MD  apixaban (ELIQUIS) 2.5 MG TABS tablet TAKE 1 TABLET BY MOUTH TWICE A DAY 08/15/21  Yes Satira Sark, MD  atorvastatin (LIPITOR) 40 MG tablet Take 1 tablet (40 mg total) by mouth daily at 6 PM. 09/19/16  Yes Trinh, Janalyn Harder, PA-C  colchicine 0.6 MG tablet Take 0.6 mg by mouth 2 (two) times daily as needed (gout).   Yes [provider]  ezetimibe (ZETIA) 10 MG tablet Take 10 mg by mouth daily.   Yes [provider]  FeFum-FePoly-FA-B Cmp-C-Biot  (FOLIVANE-PLUS) CAPS TAKE 1 CAPSULE BY MOUTH EVERY DAY IN THE MORNING Patient taking differently: Take 1 capsule by mouth daily. 10/29/18  Yes Pyrtle, Lajuan Lines, MD  fish oil-omega-3 fatty acids 1000 MG capsule Take 1 g by mouth daily.   Yes [provider]  furosemide (LASIX) 20 MG tablet Take 20 mg by mouth daily.   Yes [provider]  metoprolol tartrate (LOPRESSOR) 25 MG tablet Take 0.5 tablets (12.5 mg total) by mouth 2 (two) times daily. 01/08/17 09/10/21 Yes Satira Sark, MD  pantoprazole (PROTONIX) 40 MG tablet Take 1 tablet (40 mg total) by mouth 2 (two) times daily. Patient taking differently: Take by mouth daily. 07/03/21 09/10/21 Yes Carlan, Chelsea L, NP  prochlorperazine (COMPAZINE) 5 MG tablet Take 1 tablet (5 mg total) by mouth every 8 (eight) hours as needed for nausea or vomiting. 08/21/21  Yes Harvel Quale, MD  ondansetron (ZOFRAN) 4 MG tablet Take 1 tablet (4 mg total) by mouth every 8 (eight) hours as needed for nausea or vomiting. Patient not taking: Reported on 09/10/2021 08/16/21   Harvel Quale, MD  sucralfate (CARAFATE) 1 GM/10ML suspension Take 10 mLs (1 g total) by mouth 4 (four) times daily -  with meals and at bedtime. Patient not taking: Reported on 09/10/2021 07/03/21   Scherrie Gerlach  L, NP      Allergies    Patient has no known allergies.    Review of Systems   Review of Systems  Constitutional:  Negative for fever.  Respiratory:  Positive for shortness of breath.   Cardiovascular:  Negative for chest pain.  Gastrointestinal:  Positive for abdominal pain, diarrhea, nausea and vomiting.   Physical Exam Updated Vital Signs BP 105/60    Pulse 65    Temp 97.8 F (36.6 C) (Oral)    Resp 15    Ht 6\' 2"  (1.88 m)    Wt 88.5 kg    SpO2 100%    BMI 25.04 kg/m  Physical Exam Vitals and nursing note reviewed.  Constitutional:      General: He is not in acute distress.    Appearance: He is well-developed. He is not  ill-appearing or diaphoretic.  HENT:     Head: Normocephalic and atraumatic.  Cardiovascular:     Rate and Rhythm: Normal rate and regular rhythm.     Heart sounds: Normal heart sounds.  Pulmonary:     Effort: Pulmonary effort is normal.     Breath sounds: Normal breath sounds.  Abdominal:     Palpations: Abdomen is soft.     Tenderness: There is abdominal tenderness (mild) in the right lower quadrant and epigastric area.  Skin:    General: Skin is warm and dry.  Neurological:     Mental Status: He is alert.    ED Results / Procedures / Treatments   Labs (all labs ordered are listed, but only abnormal results are displayed) Labs Reviewed  COMPREHENSIVE METABOLIC PANEL - Abnormal; Notable for the following components:      Result Value   Glucose, Bld 117 (*)    BUN 45 (*)    Creatinine, Ser 2.33 (*)    Albumin 3.4 (*)    AST 42 (*)    Total Bilirubin 1.5 (*)    GFR, Estimated 26 (*)    All other components within normal limits  CBC WITH DIFFERENTIAL/PLATELET - Abnormal; Notable for the following components:   RBC 4.05 (*)    MCV 102.2 (*)    RDW 16.7 (*)    Platelets 140 (*)    nRBC 0.3 (*)    All other components within normal limits  RESP PANEL BY RT-PCR (FLU A&B, COVID) ARPGX2  URINALYSIS, ROUTINE W REFLEX MICROSCOPIC  LIPASE, BLOOD  LACTIC ACID, PLASMA  LACTIC ACID, PLASMA  TROPONIN I (HIGH SENSITIVITY)  TROPONIN I (HIGH SENSITIVITY)    EKG EKG Interpretation  Date/Time:  Sunday September 10 2021 18:52:54 EST Ventricular Rate:  75 PR Interval:    QRS Duration: 165 QT Interval:  490 QTC Calculation: 548 R Axis:   -60 Text Interpretation: Atrial fibrillation Left bundle branch block Confirmed by Sherwood Gambler [phone]) on 09/10/2021 7:33:39 PM  Radiology CT ABDOMEN PELVIS WO CONTRAST  Result Date: 09/10/2021 CLINICAL DATA:  Central abdominal pain for 6 months. Nausea and vomiting. EXAM: CT ABDOMEN AND PELVIS WITHOUT CONTRAST TECHNIQUE: Multidetector CT  imaging of the abdomen and pelvis was performed following the standard protocol without IV contrast. RADIATION DOSE REDUCTION: This exam was performed according to the departmental dose-optimization program which includes automated exposure control, adjustment of the mA and/or kV according to patient size and/or use of iterative reconstruction technique. COMPARISON:  None. FINDINGS: Lower chest: Small to moderate bilateral pleural effusions are seen with mild compressive atelectasis in the lung bases. Moderate cardiomegaly, without pericardial effusion.  Hepatobiliary: No mass visualized on this unenhanced exam. Mild left lobe hypertrophy and capsular nodularity raises suspicion for cirrhosis. Mild ascites in abdomen and pelvis. Gallbladder is unremarkable. No evidence of biliary ductal dilatation. Pancreas: No mass or inflammatory process visualized on this unenhanced exam. Spleen:  Within normal limits in size. Adrenals/Urinary tract: Multiple small less than 1 cm renal calculi are seen bilaterally. No evidence of ureteral calculi or hydronephrosis. Mild diffuse bladder wall thickening may be due to chronic bladder outlet obstruction or cystitis. Stomach/Bowel: Extensive colonic diverticulosis is noted. A focal area of wall thickening is seen involving the mid sigmoid colon with hazy opacity in the adjacent sigmoid mesocolon, consistent with mild diverticulitis. No evidence of bowel perforation or abscess. Vascular/Lymphatic: No pathologically enlarged lymph nodes identified. No evidence of abdominal aortic aneurysm. Aortic atherosclerotic calcification noted. Reproductive:  No mass or other significant abnormality. Other:  None. Musculoskeletal:  No suspicious bone lesions identified. IMPRESSION: Mild sigmoid diverticulitis. No evidence of bowel perforation or abscess. Bilateral nephrolithiasis. No evidence of ureteral calculi or hydronephrosis. Mild diffuse bladder wall thickening, which may be due to chronic  bladder outlet obstruction or cystitis. Mild ascites. Possible hepatic cirrhosis. Recommend correlation with liver function tests and serology. Small to moderate bilateral pleural effusions and bibasilar atelectasis. Electronically Signed   By: Marlaine Hind M.D.   On: 09/10/2021 21:34   DG Chest Portable 1 View  Result Date: 09/10/2021 CLINICAL DATA:  Shortness of breath EXAM: PORTABLE CHEST 1 VIEW COMPARISON:  04/13/2021 FINDINGS: Cardiac shadow is mildly prominent but stable. Aortic calcifications are seen. Lungs are well aerated bilaterally. Mild atelectasis/scarring is noted in the bases bilaterally. Mild central vascular prominence is noted. Changes of prior granulomatous disease are again seen. IMPRESSION: Stable bibasilar atelectasis/scarring. Prior granulomatous disease. Mild vascular congestion. Electronically Signed   By: Inez Catalina M.D.   On: 09/10/2021 19:36    Procedures Procedures    Medications Ordered in ED Medications  piperacillin-tazobactam (ZOSYN) IVPB 3.375 g (3.375 g Intravenous New Bag/Given 09/10/21 2235)  fentaNYL (SUBLIMAZE) injection 50 mcg (50 mcg Intravenous Given 09/10/21 1928)  ondansetron (ZOFRAN) injection 4 mg (4 mg Intravenous Given 09/10/21 1928)  sodium chloride 0.9 % bolus 500 mL (0 mLs Intravenous Stopped 09/10/21 2100)    ED Course/ Medical Decision Making/ A&P                           Medical Decision Making Amount and/or Complexity of Data Reviewed Labs: ordered. Radiology: ordered.  Risk Prescription drug management. Decision regarding hospitalization.   Patient overall appears to have acute diverticulitis and some other nonspecific findings on CT including some mild ascites.  Also has some small pleural effusions in his lungs on the CT.  Chest x-ray was personally reviewed by myself and is fairly benign.  Maybe some mild congestion.  No florid edema.  He did become hypoxic at 1 point and was put on oxygen but he was also given IV fentanyl.  He  is otherwise well-appearing.  Because of his acute kidney injury and diverticulitis and age I think he should be admitted.  He has multiple comorbidities including CKD, atrial fibrillation, hypertension, and more.  I discussed with Dr. Josephine Cables for admission.  I have personally interpreted his ECG which shows A. fib and LBBB but no ischemia.  Labs interpreted by myself and show an acute on chronic kidney injury.  Other labs are fairly benign.  Most recent GI visit by Dr.  [name] reviewed and it seemed like there were going to do a CTA for mesenteric ischemia which I originally ordered here but due to his kidney function he cannot get.        Final Clinical Impression(s) / ED Diagnoses Final diagnoses:  Acute diverticulitis  Acute kidney injury [facility])    Rx / DC Orders ED Discharge Orders     None         Sherwood Gambler, MD 09/10/21 2344

## 2021-09-10 NOTE — ED Notes (Signed)
Pt son at bedside. Son states pt has had difficulty eating and keeping food down. He will sometimes vomit after eating, the burning gets worse after eating and better when stomach is empty.

## 2021-09-10 NOTE — ED Triage Notes (Signed)
Pt via [facility], c/o burning sensation in center of abdomen for the last six months. Feels like it travels upwards at times. Pt states he takes antacids with some relief. Denies pain, states more burning discomfort. Vitals stable upon arrival.

## 2021-09-10 NOTE — H&P (Signed)
History and Physical  Ronald Colon:[id number] DOB: 04-27-1929 DOA: 09/10/2021  Referring physician: Sherwood Gambler, MD PCP: [name], MD  Patient coming from: Home  Chief Complaint: Abdominal pain  HPI: Ronald Colon is a 88 y.o. 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 [name] (gastroenterology).  Patient reports worsening of the abdominal pain which was described as burning sensation with reflux into the midsternal chest area, pain was of a moderate intensity, it worsens after eating and was associated with nausea and occasional vomiting.  Abdominal pain was worse today which resulted in him activating EMS and was taken to the ED for further evaluation and management.  He denies diarrhea, vomiting, fever, chills, chest pain, weakness, blurry vision or any sick contact.  ED Course:  In the emergency department she was intermittently tachypneic, She was noted to be hypoxic with an O2 sat of 76% on room air, supplemental oxygen via [place] at 3 LPM was provided with improvement in O2 sats 5%.  Other vital signs were within normal range.  Work-up in the ED showed thrombocytopenia, elevated MCV, BUN/creatinine 45/2.33 (most recent labs for comparison was 1.4-1.6 about 4 years ago), albumin 3.4, AST 42.  Troponin x2 was flat at 16, lactic acid was within normal range, urinalysis was normal, lipase 49.  Influenza A, B, SARS coronavirus 2 was negative. CT abdomen and pelvis without contrast showed mild sigmoid diverticulitis.  No evidence of bowel perforation or abscess. Chest x-ray showed stable bibasilar atelectasis/slightly and mild vascular congestion She was treated with IV fentanyl, Zofran was given, gentle IV hydration was provided and patient was started on IV Zosyn.  Hospitalist was asked to  admit patient for further evaluation and management.   Review of Systems: A full 10 point Review of Systems was done, except as stated above, all other Review of systems were negative.  Past Medical History:  Diagnosis Date   Aortic stenosis    Mild February 2018   Arthritis    Atrial fibrillation [facility])    Diagnosed January 2018   AVM (arteriovenous malformation)    Carotid stenosis    Diverticulosis    Essential hypertension    Gout    Hx of adenomatous colonic polyps    Hyperlipidemia    IDA (iron deficiency anemia)    Internal hemorrhoids    Rheumatic fever 1937   Stroke ([place]) 09/10/2016   Tubular adenoma of colon    Past Surgical History:  Procedure Laterality Date   CATARACT EXTRACTION W/PHACO Right 08/02/2014   Procedure: CATARACT EXTRACTION PHACO AND INTRAOCULAR LENS PLACEMENT; CDE:  12.77;  Surgeon: Williams Che, MD;  Location: [facility];  Service: Ophthalmology;  Laterality: Right;   CATARACT EXTRACTION W/PHACO Left 02/15/2015   Procedure: CATARACT EXTRACTION PHACO AND INTRAOCULAR LENS PLACEMENT (IOC);  Surgeon: Williams Che, MD;  Location: [facility];  Service: Ophthalmology;  Laterality: Left;  CDE 18.00   COLONOSCOPY     COLONOSCOPY WITH PROPOFOL N/A 12/13/2016   ileum normal, polyp in cecum, ascending colon-multiple, transverse colon, diverticulosis in sigmoid descending and transverse, narrowing of colon in assoc with diverticular opening, internal hemorrhoids   ELBOW SURGERY Right    "grissle in there"   ENDARTERECTOMY Left 09/17/2016   Procedure: LEFT CAROTID ENDARTECTOMY;  Surgeon: Waynetta Sandy, MD;  Location: [facility];  Service: Vascular;  Laterality: Left;   ESOPHAGEAL DILATION  09/03/2013   Procedure: ESOPHAGEAL DILATION;  Surgeon: Rogene Houston, MD;  [facility]: [facility];  Service: Endoscopy;;   ESOPHAGOGASTRODUODENOSCOPY N/A 09/03/2013   Procedure: ESOPHAGOGASTRODUODENOSCOPY (EGD);  Surgeon: Rogene Houston, MD;  [facility];   Service: Endoscopy;  Laterality: N/A;  255-rescheduled to 1030 Ann to notify pt   ESOPHAGOGASTRODUODENOSCOPY (EGD) WITH PROPOFOL N/A 12/13/2016   normal, no specimens   GIVENS CAPSULE STUDY N/A 08/17/2017   AVMs   PATCH ANGIOPLASTY Left 09/17/2016   Procedure: PATCH ANGIOPLASTY WITH Rueben Bash BIOLOGIC PATCH 1 CM X6 CM;  Surgeon: Waynetta Sandy, MD;  Location: [facility];  Service: Vascular;  Laterality: Left;    Social History:  reports that he quit smoking about 43 years ago. His smoking use included cigarettes. He has a 20.00 pack-year smoking history. He has never used smokeless tobacco. He reports that he does not drink alcohol and does not use drugs.   No Known Allergies  Family History  Problem Relation Age of Onset   CVA Mother 31   Pneumonia Father 63   Colon cancer Neg Hx      Prior to Admission medications   Medication Sig Start Date End Date Taking? Authorizing Provider  acetaminophen (TYLENOL) 325 MG tablet Take 650 mg by mouth daily as needed for mild pain or headache.    Yes [provider]  allopurinol (ZYLOPRIM) 300 MG tablet Take 300 mg by mouth daily.   Yes [provider]  alum & mag hydroxide-simeth (MAALOX/MYLANTA) 200-200-20 MG/5ML suspension Take by mouth every 6 (six) hours as needed for indigestion or heartburn.   Yes [provider]  amiodarone (PACERONE) 200 MG tablet Take 0.5 tablets (100 mg total) by mouth daily. 05/07/17  Yes Satira Sark, MD  apixaban (ELIQUIS) 2.5 MG TABS tablet TAKE 1 TABLET BY MOUTH TWICE A DAY 08/15/21  Yes Satira Sark, MD  atorvastatin (LIPITOR) 40 MG tablet Take 1 tablet (40 mg total) by mouth daily at 6 PM. 09/19/16  Yes Trinh, Janalyn Harder, PA-C  colchicine 0.6 MG tablet Take 0.6 mg by mouth 2 (two) times daily as needed (gout).   Yes [provider]  ezetimibe (ZETIA) 10 MG tablet Take 10 mg by mouth daily.   Yes [provider]  FeFum-FePoly-FA-B Cmp-C-Biot (FOLIVANE-PLUS)  CAPS TAKE 1 CAPSULE BY MOUTH EVERY DAY IN THE MORNING Patient taking differently: Take 1 capsule by mouth daily. 10/29/18  Yes Pyrtle, Lajuan Lines, MD  fish oil-omega-3 fatty acids 1000 MG capsule Take 1 g by mouth daily.   Yes [provider]  furosemide (LASIX) 20 MG tablet Take 20 mg by mouth daily.   Yes [provider]  metoprolol tartrate (LOPRESSOR) 25 MG tablet Take 0.5 tablets (12.5 mg total) by mouth 2 (two) times daily. 01/08/17 09/10/21 Yes Satira Sark, MD  pantoprazole (PROTONIX) 40 MG tablet Take 1 tablet (40 mg total) by mouth 2 (two) times daily. Patient taking differently: Take by mouth daily. 07/03/21 09/10/21 Yes Carlan, Chelsea L, NP  prochlorperazine (COMPAZINE) 5 MG tablet Take 1 tablet (5 mg total) by mouth every 8 (eight) hours as needed for nausea or vomiting. 08/21/21  Yes Harvel Quale, MD  ondansetron (ZOFRAN) 4 MG tablet Take 1 tablet (4 mg total) by mouth every 8 (eight) hours as needed for nausea or vomiting. Patient not taking: Reported on 09/10/2021 08/16/21   Harvel Quale, MD  sucralfate (  CARAFATE) 1 GM/10ML suspension Take 10 mLs (1 g total) by mouth 4 (four) times daily -  with meals and at bedtime. Patient not taking: Reported on 09/10/2021 07/03/21   Gabriel Rung, NP    Physical Exam: BP 105/60    Pulse 65    Temp 97.8 F (36.6 C) (Oral)    Resp 15    Ht 6\' 2"  (1.88 m)    Wt 88.5 kg    SpO2 100%    BMI 25.04 kg/m   General: 88 y.o. year-old male well developed well nourished in no acute distress.  Alert and oriented x3. HEENT: NCAT, EOMI Neck: Supple, trachea medial Cardiovascular: Regular rate and rhythm with no rubs or gallops.  No thyromegaly or JVD noted.  No lower extremity edema. 2/4 pulses in all 4 extremities. Respiratory: Clear to auscultation with no wheezes or rales. Good inspiratory effort. Abdomen: Soft, tender to palpation of epigastric area and LLQ.  Nondistended with normal bowel sounds x4  quadrants. Muskuloskeletal: No cyanosis, clubbing or edema noted bilaterally Neuro: CN II-XII intact, strength 5/5 x 4, sensation, reflexes intact Skin: No ulcerative lesions noted or rashes Psychiatry: Judgement and insight appear normal. Mood is appropriate for condition and setting          Labs on Admission:  Basic Metabolic Panel: Recent Labs  Lab 09/10/21 1907  NA 137  K 4.3  CL 108  CO2 24  GLUCOSE 117*  BUN 45*  CREATININE 2.33*  CALCIUM 9.0   Liver Function Tests: Recent Labs  Lab 09/10/21 1907  AST 42*  ALT 28  ALKPHOS 84  BILITOT 1.5*  PROT 6.7  ALBUMIN 3.4*   Recent Labs  Lab 09/10/21 1907  LIPASE 49   No results for input(s): AMMONIA in the last 168 hours. CBC: Recent Labs  Lab 09/10/21 1907  WBC 6.2  NEUTROABS 4.6  HGB 13.3  HCT 41.4  MCV 102.2*  PLT 140*   Cardiac Enzymes: No results for input(s): CKTOTAL, CKMB, CKMBINDEX, TROPONINI in the last 168 hours.  BNP (last 3 results) No results for input(s): BNP in the last 8760 hours.  ProBNP (last 3 results) No results for input(s): PROBNP in the last 8760 hours.  CBG: No results for input(s): GLUCAP in the last 168 hours.  Radiological Exams on Admission: CT ABDOMEN PELVIS WO CONTRAST  Result Date: 09/10/2021 CLINICAL DATA:  Central abdominal pain for 6 months. Nausea and vomiting. EXAM: CT ABDOMEN AND PELVIS WITHOUT CONTRAST TECHNIQUE: Multidetector CT imaging of the abdomen and pelvis was performed following the standard protocol without IV contrast. RADIATION DOSE REDUCTION: This exam was performed according to the departmental dose-optimization program which includes automated exposure control, adjustment of the mA and/or kV according to patient size and/or use of iterative reconstruction technique. COMPARISON:  None. FINDINGS: Lower chest: Small to moderate bilateral pleural effusions are seen with mild compressive atelectasis in the lung bases. Moderate cardiomegaly, without  pericardial effusion. Hepatobiliary: No mass visualized on this unenhanced exam. Mild left lobe hypertrophy and capsular nodularity raises suspicion for cirrhosis. Mild ascites in abdomen and pelvis. Gallbladder is unremarkable. No evidence of biliary ductal dilatation. Pancreas: No mass or inflammatory process visualized on this unenhanced exam. Spleen:  Within normal limits in size. Adrenals/Urinary tract: Multiple small less than 1 cm renal calculi are seen bilaterally. No evidence of ureteral calculi or hydronephrosis. Mild diffuse bladder wall thickening may be due to chronic bladder outlet obstruction or cystitis. Stomach/Bowel: Extensive colonic diverticulosis is noted. A  focal area of wall thickening is seen involving the mid sigmoid colon with hazy opacity in the adjacent sigmoid mesocolon, consistent with mild diverticulitis. No evidence of bowel perforation or abscess. Vascular/Lymphatic: No pathologically enlarged lymph nodes identified. No evidence of abdominal aortic aneurysm. Aortic atherosclerotic calcification noted. Reproductive:  No mass or other significant abnormality. Other:  None. Musculoskeletal:  No suspicious bone lesions identified. IMPRESSION: Mild sigmoid diverticulitis. No evidence of bowel perforation or abscess. Bilateral nephrolithiasis. No evidence of ureteral calculi or hydronephrosis. Mild diffuse bladder wall thickening, which may be due to chronic bladder outlet obstruction or cystitis. Mild ascites. Possible hepatic cirrhosis. Recommend correlation with liver function tests and serology. Small to moderate bilateral pleural effusions and bibasilar atelectasis. Electronically Signed   By: Marlaine Hind M.D.   On: 09/10/2021 21:34   DG Chest Portable 1 View  Result Date: 09/10/2021 CLINICAL DATA:  Shortness of breath EXAM: PORTABLE CHEST 1 VIEW COMPARISON:  04/13/2021 FINDINGS: Cardiac shadow is mildly prominent but stable. Aortic calcifications are seen. Lungs are well aerated  bilaterally. Mild atelectasis/scarring is noted in the bases bilaterally. Mild central vascular prominence is noted. Changes of prior granulomatous disease are again seen. IMPRESSION: Stable bibasilar atelectasis/scarring. Prior granulomatous disease. Mild vascular congestion. Electronically Signed   By: Inez Catalina M.D.   On: 09/10/2021 19:36    EKG: I independently viewed the EKG done and my findings are as followed: A. fib with rate control with prolonged QTc 548 ms  Assessment/Plan Present on Admission:  Diverticulitis  Nausea with vomiting  GERD (gastroesophageal reflux disease)  Principal Problem:   Diverticulitis Active Problems:   GERD (gastroesophageal reflux disease)   Atrial fibrillation, chronic (HCC)   Hyperlipidemia, unspecified   Nausea with vomiting   Abdominal pain   Thrombocytopenia (HCC)   Hypoalbuminemia   AKI (acute kidney injury) ([place])   Respiratory failure with hypoxia (HCC)   Elevated MCV   Prolonged QT interval  Abdominal pain nausea, vomiting possibly secondary to multifactorial including gastritis, diverticulitis CT abdomen and pelvis was suggestive of mild sigmoid diverticulitis Continue IV NS at 75 mLs/Hr Continue IV Zosyn 3.375 q.8h Continue IV morphine 2 mg q.4h p.r.n. for moderate to severe pain Continue Protonix Continue IV Compazine p.r.n. Continue clear liquid diet with plan to advanced diet as tolerated Consider surgical consult for worsening of symptoms  GERD Continue Protonix  Acute respiratory failure with hypoxia due to unknown cause at this time SARS coronavirus 2 was negative Continue supplemental oxygen to maintain O2 sat > 92% with plan to wean patient off oxygen as tolerated  Acute kidney injury BUN/creatinine 45/2.33 (most recent lab for comparison was 1.4-1.6 about 4 years ago) Continue IV hydration Renally adjust medications, avoid nephrotoxic agents/dehydration/hypotension  Elevated MCV MCV 102.2; folate and vitamin B12  levels will be checked  Thrombocytopenia possibly reactive Platelets 140, continue to monitor platelet levels  Prolonged QTc (548 ms) Avoid QT prolonging drugs Magnesium level will be checked Repeat EKG in the morning  Hypoalbuminemia secondary to mild protein calorie malnutrition Protein supplement to be provided  Hyperlipidemia Continue Lipitor and Zetia  Hypertension Continue Lopressor  Chronic atrial fibrillation Continue Eliquis and Lopressor Amiodarone temporarily held at this time due to prolonged QTc  DVT prophylaxis: Eliquis  Code Status: Full code  Family Communication: Son at bedside  Disposition Plan:  Patient is from:                        home Anticipated DC  to:                   SNF or family members home Anticipated DC date:               2-3 days Anticipated DC barriers:          Patient requires inpatient management due to diverticulitis requiring IV antibiotics  Consults called: None  Admission status: Inpatient    Bernadette Hoit MD [facility]  09/11/2021, 12:30 AM

## 2021-09-11 ENCOUNTER — Other Ambulatory Visit (HOSPITAL_COMMUNITY): Payer: Self-pay | Admitting: *Deleted

## 2021-09-11 ENCOUNTER — Inpatient Hospital Stay (HOSPITAL_COMMUNITY): Payer: Medicare Other

## 2021-09-11 DIAGNOSIS — K5792 Diverticulitis of intestine, part unspecified, without perforation or abscess without bleeding: Secondary | ICD-10-CM | POA: Diagnosis not present

## 2021-09-11 DIAGNOSIS — R0609 Other forms of dyspnea: Secondary | ICD-10-CM | POA: Diagnosis not present

## 2021-09-11 DIAGNOSIS — R9431 Abnormal electrocardiogram [ECG] [EKG]: Secondary | ICD-10-CM | POA: Diagnosis present

## 2021-09-11 DIAGNOSIS — N179 Acute kidney failure, unspecified: Secondary | ICD-10-CM | POA: Diagnosis present

## 2021-09-11 DIAGNOSIS — R718 Other abnormality of red blood cells: Secondary | ICD-10-CM | POA: Diagnosis present

## 2021-09-11 DIAGNOSIS — E8809 Other disorders of plasma-protein metabolism, not elsewhere classified: Secondary | ICD-10-CM | POA: Diagnosis present

## 2021-09-11 DIAGNOSIS — J9691 Respiratory failure, unspecified with hypoxia: Secondary | ICD-10-CM | POA: Diagnosis present

## 2021-09-11 DIAGNOSIS — D696 Thrombocytopenia, unspecified: Secondary | ICD-10-CM | POA: Diagnosis present

## 2021-09-11 DIAGNOSIS — R109 Unspecified abdominal pain: Secondary | ICD-10-CM | POA: Diagnosis present

## 2021-09-11 LAB — RESP PANEL BY RT-PCR (FLU A&B, COVID) ARPGX2
Influenza A by PCR: NEGATIVE
Influenza B by PCR: NEGATIVE
SARS Coronavirus 2 by RT PCR: NEGATIVE

## 2021-09-11 LAB — ECHOCARDIOGRAM COMPLETE
AR max vel: 0.94 cm2
AV Area VTI: 0.91 cm2
AV Area mean vel: 0.89 cm2
AV Mean grad: 13 mmHg
AV Peak grad: 25.2 mmHg
Ao pk vel: 2.51 m/s
Area-P 1/2: 4.89 cm2
Height: 74 in
MV VTI: 2.2 cm2
S' Lateral: 5.4 cm
Weight: 3120 oz

## 2021-09-11 LAB — COMPREHENSIVE METABOLIC PANEL
ALT: 25 U/L (ref 0–44)
AST: 36 U/L (ref 15–41)
Albumin: 3 g/dL — ABNORMAL LOW (ref 3.5–5.0)
Alkaline Phosphatase: 73 U/L (ref 38–126)
Anion gap: 6 (ref 5–15)
BUN: 45 mg/dL — ABNORMAL HIGH (ref 8–23)
CO2: 24 mmol/L (ref 22–32)
Calcium: 8.8 mg/dL — ABNORMAL LOW (ref 8.9–10.3)
Chloride: 110 mmol/L (ref 98–111)
Creatinine, Ser: 2.22 mg/dL — ABNORMAL HIGH (ref 0.61–1.24)
GFR, Estimated: 27 mL/min — ABNORMAL LOW (ref >60)
Glucose, Bld: 105 mg/dL — ABNORMAL HIGH (ref 70–99)
Potassium: 4 mmol/L (ref 3.5–5.1)
Sodium: 140 mmol/L (ref 135–145)
Total Bilirubin: 1.3 mg/dL — ABNORMAL HIGH (ref 0.3–1.2)
Total Protein: 6 g/dL — ABNORMAL LOW (ref 6.5–8.1)

## 2021-09-11 LAB — PHOSPHORUS: Phosphorus: 4.1 mg/dL (ref 2.5–4.6)

## 2021-09-11 LAB — CBC
HCT: 38.3 % — ABNORMAL LOW (ref 39.0–52.0)
Hemoglobin: 12.3 g/dL — ABNORMAL LOW (ref 13.0–17.0)
MCH: 33.3 pg (ref 26.0–34.0)
MCHC: 32.1 g/dL (ref 30.0–36.0)
MCV: 103.8 fL — ABNORMAL HIGH (ref 80.0–100.0)
Platelets: 128 10*3/uL — ABNORMAL LOW (ref 150–400)
RBC: 3.69 MIL/uL — ABNORMAL LOW (ref 4.22–5.81)
RDW: 16.4 % — ABNORMAL HIGH (ref 11.5–15.5)
WBC: 6 10*3/uL (ref 4.0–10.5)
nRBC: 0 % (ref 0.0–0.2)

## 2021-09-11 LAB — VITAMIN B12
Vitamin B-12: 1056 pg/mL — ABNORMAL HIGH (ref 180–914)
Vitamin B-12: 1055 pg/mL — ABNORMAL HIGH (ref 180–914)

## 2021-09-11 LAB — APTT: aPTT: 32 seconds (ref 24–36)

## 2021-09-11 LAB — FOLATE: Folate: 38.1 ng/mL (ref >5.9)

## 2021-09-11 LAB — MAGNESIUM: Magnesium: 2.5 mg/dL — ABNORMAL HIGH (ref 1.7–2.4)

## 2021-09-11 MED ORDER — ENSURE ENLIVE PO LIQD
237.0000 mL | Freq: Two times a day (BID) | ORAL | Status: DC
  Administered 2021-09-12 – 2021-09-15 (×4): 237 mL via ORAL

## 2021-09-11 MED ORDER — PERFLUTREN LIPID MICROSPHERE
1.0000 mL | INTRAVENOUS | Status: AC | PRN
  Administered 2021-09-11: 3 mL via INTRAVENOUS
  Filled 2021-09-11: qty 10

## 2021-09-11 MED ORDER — AMIODARONE HCL 200 MG PO TABS
100.0000 mg | ORAL_TABLET | Freq: Every day | ORAL | Status: DC
  Administered 2021-09-12 – 2021-09-15 (×4): 100 mg via ORAL
  Filled 2021-09-11 (×4): qty 1

## 2021-09-11 MED ORDER — MORPHINE SULFATE (PF) 2 MG/ML IV SOLN
2.0000 mg | INTRAVENOUS | Status: DC | PRN
  Administered 2021-09-11 – 2021-09-15 (×4): 2 mg via INTRAVENOUS
  Filled 2021-09-11 (×4): qty 1

## 2021-09-11 MED ORDER — APIXABAN 2.5 MG PO TABS
2.5000 mg | ORAL_TABLET | Freq: Two times a day (BID) | ORAL | Status: DC
  Administered 2021-09-11 – 2021-09-12 (×2): 2.5 mg via ORAL
  Filled 2021-09-11 (×2): qty 1

## 2021-09-11 MED ORDER — EZETIMIBE 10 MG PO TABS
10.0000 mg | ORAL_TABLET | Freq: Every day | ORAL | Status: DC
  Administered 2021-09-11 – 2021-09-15 (×5): 10 mg via ORAL
  Filled 2021-09-11 (×5): qty 1

## 2021-09-11 MED ORDER — ATORVASTATIN CALCIUM 40 MG PO TABS
40.0000 mg | ORAL_TABLET | Freq: Every day | ORAL | Status: DC
  Administered 2021-09-12 – 2021-09-14 (×3): 40 mg via ORAL
  Filled 2021-09-11 (×3): qty 1

## 2021-09-11 MED ORDER — PANTOPRAZOLE SODIUM 40 MG IV SOLR
40.0000 mg | Freq: Two times a day (BID) | INTRAVENOUS | Status: DC
  Administered 2021-09-11 – 2021-09-14 (×8): 40 mg via INTRAVENOUS
  Filled 2021-09-11 (×8): qty 40

## 2021-09-11 MED ORDER — METOPROLOL TARTRATE 25 MG PO TABS
12.5000 mg | ORAL_TABLET | Freq: Two times a day (BID) | ORAL | Status: AC
Start: 2021-09-11 — End: 2021-09-12
  Administered 2021-09-11 – 2021-09-12 (×3): 12.5 mg via ORAL
  Filled 2021-09-11 (×3): qty 1

## 2021-09-11 MED ORDER — SODIUM CHLORIDE 0.9 % IV SOLN: INTRAVENOUS | Status: DC

## 2021-09-11 MED ORDER — PROCHLORPERAZINE EDISYLATE 10 MG/2ML IJ SOLN: 10.0000 mg | Freq: Four times a day (QID) | INTRAMUSCULAR | Status: DC | PRN

## 2021-09-11 NOTE — Progress Notes (Addendum)
Progress Note    Ronald Colon  YKD:[id number] DOB: 1928/08/28  DOA: 09/10/2021 PCP: [name], MD      Medical records reviewed and are as summarized below:  Ronald Colon is an 88 y.o. 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 [name] (gastroenterology).  Patient reports worsening of the abdominal pain which was described as burning sensation with reflux into the midsternal chest area, pain was of a moderate intensity, it worsens after eating and was associated with nausea and occasional vomiting.     Assessment/Plan:   Principal Problem:   Diverticulitis Active Problems:   GERD (gastroesophageal reflux disease)   Atrial fibrillation, chronic (HCC)   Hyperlipidemia, unspecified   Nausea with vomiting   Abdominal pain   Thrombocytopenia (HCC)   Hypoalbuminemia   AKI (acute kidney injury) ([place])   Respiratory failure with hypoxia (HCC)   Elevated MCV   Prolonged QT interval   Abdominal pain nausea, vomiting possibly secondary to multifactorial including gastritis, diverticulitis CT abdomen and pelvis was suggestive of mild sigmoid diverticulitis- but pain is upper and described as burning Continue IV Zosyn 3.375 q.8h Continue IV morphine 2 mg q.4h p.r.n. for moderate to severe pain Continue Protonix Continue IV Compazine p.r.n. Continue clear liquid diet  GI consult for consideration of EGD as this was being contemplated for outpatient-- will need repeat echo and prob. Cards consult prior to procedure   GERD Continue Protonix  Acute kidney injury BUN/creatinine 45/2.33 (most recent lab for comparison was 1.4-1.6 about 4 years ago) Hold IVF as x ray shows congestion Renally adjust medications, avoid nephrotoxic agents/dehydration/hypotension  Elevated MCV MCV  102.2 B12> 1000  Thrombocytopenia possibly reactive Platelets 140- appears new -monitor closely  Prolonged QTc  -monitor on tele  Hypoalbuminemia secondary to mild protein calorie malnutrition Protein supplement to be provided  Hyperlipidemia Continue Lipitor and Zetia  Hypertension Continue Lopressor  Chronic atrial fibrillation Continue Eliquis and Lopressor -resume amiodarone in AM- QTc improving   Family Communication/Anticipated D/C date and plan/Code Status   DVT prophylaxis: scd Code Status: Full Code.  Disposition Plan: Status is: Inpatient  Remains inpatient appropriate because: needs EGD but echo first         Medical Consultants:  GI  Subjective:   Pain is more burning and epigastric than LLQ  Objective:    Vitals:   09/10/21 2300 09/10/21 2330 09/11/21 0054 09/11/21 0601  BP: 118/75 105/60 123/71 94/62  Pulse: 68 65 75 66  Resp: 13 15 19 19   Temp:   98 F (36.7 C) 98 F (36.7 C)  TempSrc:      SpO2: 100% 100% 99% 97%  Weight:      Height:        Intake/Output Summary (Last 24 hours) at 09/11/2021 1049 Last data filed at 09/11/2021 0900 Gross per 24 hour  Intake 734.98 ml  Output --  Net 734.98 ml   Filed Weights   09/10/21 1845  Weight: 88.5 kg    Exam:  General: Appearance:     Overweight male in no acute distress     Lungs:     respirations unlabored  Heart:    Normal heart rate. irregular  MS:   All extremities are intact.    Neurologic:   Awake, alert, pleasant and cooperative  Data Reviewed:   I have personally reviewed following labs and imaging studies:  Labs: Labs show the following:   Basic Metabolic Panel: Recent Labs  Lab 09/10/21 1907 09/11/21 0454  NA 137 140  K 4.3 4.0  CL 108 110  CO2 24 24  GLUCOSE 117* 105*  BUN 45* 45*  CREATININE 2.33* 2.22*  CALCIUM 9.0 8.8*  MG  --  2.5*  PHOS  --  4.1   GFR Estimated Creatinine Clearance: 25.7 mL/min (A) (by C-G formula based on SCr of 2.22  mg/dL (H)). Liver Function Tests: Recent Labs  Lab 09/10/21 1907 09/11/21 0454  AST 42* 36  ALT 28 25  ALKPHOS 84 73  BILITOT 1.5* 1.3*  PROT 6.7 6.0*  ALBUMIN 3.4* 3.0*   Recent Labs  Lab 09/10/21 1907  LIPASE 49   No results for input(s): AMMONIA in the last 168 hours. Coagulation profile No results for input(s): INR, PROTIME in the last 168 hours.  CBC: Recent Labs  Lab 09/10/21 1907 09/11/21 0454  WBC 6.2 6.0  NEUTROABS 4.6  --   HGB 13.3 12.3*  HCT 41.4 38.3*  MCV 102.2* 103.8*  PLT 140* 128*   Cardiac Enzymes: No results for input(s): CKTOTAL, CKMB, CKMBINDEX, TROPONINI in the last 168 hours. BNP (last 3 results) No results for input(s): PROBNP in the last 8760 hours. CBG: No results for input(s): GLUCAP in the last 168 hours. D-Dimer: No results for input(s): DDIMER in the last 72 hours. Hgb A1c: No results for input(s): HGBA1C in the last 72 hours. Lipid Profile: No results for input(s): CHOL, HDL, LDLCALC, TRIG, CHOLHDL, LDLDIRECT in the last 72 hours. Thyroid function studies: No results for input(s): TSH, T4TOTAL, T3FREE, THYROIDAB in the last 72 hours.  Invalid input(s): FREET3 Anemia work up: Recent Labs    09/11/21 0454  VITAMINB12 1,056*  FOLATE 38.1   Sepsis Labs: Recent Labs  Lab 09/10/21 1907 09/10/21 1959 09/10/21 2114 09/11/21 0454  WBC 6.2  --   --  6.0  LATICACIDVEN  --  1.4 1.7  --     Microbiology Recent Results (from the past 240 hour(s))  Resp Panel by RT-PCR (Flu A&B, Covid) Nasopharyngeal Swab     Status: None   Collection Time: 09/10/21 11:09 PM   Specimen: Nasopharyngeal Swab; Nasopharyngeal(NP) swabs in vial transport medium  Result Value Ref Range Status   SARS Coronavirus 2 by RT PCR NEGATIVE NEGATIVE Final    Comment: (NOTE) SARS-CoV-2 target nucleic acids are NOT DETECTED.  The SARS-CoV-2 RNA is generally detectable in upper respiratory specimens during the acute phase of infection. The  lowest concentration of SARS-CoV-2 viral copies this assay can detect is 138 copies/mL. A negative result does not preclude SARS-Cov-2 infection and should not be used as the sole basis for treatment or other patient management decisions. A negative result may occur with  improper specimen collection/handling, submission of specimen other than nasopharyngeal swab, presence of viral mutation(s) within the areas targeted by this assay, and inadequate number of viral copies(<138 copies/mL). A negative result must be combined with clinical observations, patient history, and epidemiological information. The expected result is Negative.  Fact Sheet for Patients:  EntrepreneurPulse.com.au  Fact Sheet for Healthcare Providers:  IncredibleEmployment.be  This test is no t yet approved or cleared by the [place] FDA and  has been authorized for detection and/or diagnosis of SARS-CoV-2 by FDA under an Emergency Use Authorization (EUA). This EUA will remain  in effect (meaning this test  can be used) for the duration of the COVID-19 declaration under Section 564(b)(1) of the Act, 21 U.S.C.section 360bbb-3(b)(1), unless the authorization is terminated  or revoked sooner.       Influenza A by PCR NEGATIVE NEGATIVE Final   Influenza B by PCR NEGATIVE NEGATIVE Final    Comment: (NOTE) The Xpert Xpress SARS-CoV-2/FLU/RSV plus assay is intended as an aid in the diagnosis of influenza from Nasopharyngeal swab specimens and should not be used as a sole basis for treatment. Nasal washings and aspirates are unacceptable for Xpert Xpress SARS-CoV-2/FLU/RSV testing.  Fact Sheet for Patients: EntrepreneurPulse.com.au  Fact Sheet for Healthcare Providers: IncredibleEmployment.be  This test is not yet approved or cleared by the [place] FDA and has been authorized for detection and/or diagnosis of SARS-CoV-2 by FDA under  an Emergency Use Authorization (EUA). This EUA will remain in effect (meaning this test can be used) for the duration of the COVID-19 declaration under Section 564(b)(1) of the Act, 21 U.S.C. section 360bbb-3(b)(1), unless the authorization is terminated or revoked.  Performed at [facility], [address], [place], [place] [address]     Procedures and diagnostic studies:  CT ABDOMEN PELVIS WO CONTRAST  Result Date: 09/10/2021 CLINICAL DATA:  Central abdominal pain for 6 months. Nausea and vomiting. EXAM: CT ABDOMEN AND PELVIS WITHOUT CONTRAST TECHNIQUE: Multidetector CT imaging of the abdomen and pelvis was performed following the standard protocol without IV contrast. RADIATION DOSE REDUCTION: This exam was performed according to the departmental dose-optimization program which includes automated exposure control, adjustment of the mA and/or kV according to patient size and/or use of iterative reconstruction technique. COMPARISON:  None. FINDINGS: Lower chest: Small to moderate bilateral pleural effusions are seen with mild compressive atelectasis in the lung bases. Moderate cardiomegaly, without pericardial effusion. Hepatobiliary: No mass visualized on this unenhanced exam. Mild left lobe hypertrophy and capsular nodularity raises suspicion for cirrhosis. Mild ascites in abdomen and pelvis. Gallbladder is unremarkable. No evidence of biliary ductal dilatation. Pancreas: No mass or inflammatory process visualized on this unenhanced exam. Spleen:  Within normal limits in size. Adrenals/Urinary tract: Multiple small less than 1 cm renal calculi are seen bilaterally. No evidence of ureteral calculi or hydronephrosis. Mild diffuse bladder wall thickening may be due to chronic bladder outlet obstruction or cystitis. Stomach/Bowel: Extensive colonic diverticulosis is noted. A focal area of wall thickening is seen involving the mid sigmoid colon with hazy opacity in the adjacent sigmoid mesocolon,  consistent with mild diverticulitis. No evidence of bowel perforation or abscess. Vascular/Lymphatic: No pathologically enlarged lymph nodes identified. No evidence of abdominal aortic aneurysm. Aortic atherosclerotic calcification noted. Reproductive:  No mass or other significant abnormality. Other:  None. Musculoskeletal:  No suspicious bone lesions identified. IMPRESSION: Mild sigmoid diverticulitis. No evidence of bowel perforation or abscess. Bilateral nephrolithiasis. No evidence of ureteral calculi or hydronephrosis. Mild diffuse bladder wall thickening, which may be due to chronic bladder outlet obstruction or cystitis. Mild ascites. Possible hepatic cirrhosis. Recommend correlation with liver function tests and serology. Small to moderate bilateral pleural effusions and bibasilar atelectasis. Electronically Signed   By: Marlaine Hind M.D.   On: 09/10/2021 21:34   DG Chest Portable 1 View  Result Date: 09/10/2021 CLINICAL DATA:  Shortness of breath EXAM: PORTABLE CHEST 1 VIEW COMPARISON:  04/13/2021 FINDINGS: Cardiac shadow is mildly prominent but stable. Aortic calcifications are seen. Lungs are well aerated bilaterally. Mild atelectasis/scarring is noted in the bases bilaterally. Mild central vascular prominence is noted. Changes of prior granulomatous  disease are again seen. IMPRESSION: Stable bibasilar atelectasis/scarring. Prior granulomatous disease. Mild vascular congestion. Electronically Signed   By: Inez Catalina M.D.   On: 09/10/2021 19:36    Medications:    feeding supplement  237 mL Oral BID BM   pantoprazole (PROTONIX) IV  40 mg Intravenous Q12H   Continuous Infusions:  sodium chloride 75 mL/hr at 09/11/21 0100   piperacillin-tazobactam (ZOSYN)  IV 3.375 g (09/11/21 0500)     LOS: 1 day   Geradine Girt  [facility]   How to contact the [facility] Attending or Consulting provider [place] or covering provider during after hours [place], for this patient?  Check the care team  in [facility] and look for a) attending/consulting [facility] provider listed and b) the [facility] team listed Log into [email or web address] and use [place]'s universal password to access. If you do not have the password, please contact the hospital operator. Locate the [facility] provider you are looking for under [facility] and page to a number that you can be directly reached. If you still have difficulty reaching the provider, please page the [facility] (Director on Call) for the Hospitalists listed on amion for assistance.  09/11/2021, 10:49 AM

## 2021-09-11 NOTE — Plan of Care (Signed)

## 2021-09-11 NOTE — TOC Progression Note (Signed)
°  Transition of Care [facility]) Screening Note   Patient Details  Name: Ronald Colon Date of Birth: 08-19-1928   Transition of Care [facility]) CM/SW Contact:    Shade Flood, LCSW Phone Number: 09/11/2021, 9:38 AM    Transition of Care Department [facility]) has reviewed patient and no TOC needs have been identified at this time. We will continue to monitor patient advancement through interdisciplinary progression rounds. If new patient transition needs arise, please place a TOC consult.

## 2021-09-11 NOTE — Consult Note (Addendum)
Referring Provider: Dr. Eliseo Squires  Primary Care Physician:  [name], MD Primary Gastroenterologist:  Dr. [name]  Date of Admission: 09/10/21 Date of Consultation: 09/11/21  Reason for Consultation:  Abdominal pain, consideration for EGD.   HPI:  Ronald Colon is a 88 y.o. 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 Associated intermittent nausea. He was seen by Dr. [name] on 08/16/21. CTA had been scheduled due to postprandial abdominal pain but not completed yet. He was also to see Cardiology due to shortness of breath and known aortic stenosis. Now presented to ED yesterday with abdominal pain, N/V, and CT abd/pelvis without contrast noting mild sigmoid diverticulitis. Possible hepatic cirrhosis.    He notes postprandial abdominal burning with eating, radiating up into chest. Denies any NSAIDs. Stool is black on iron. No weight loss. He has noted food aversion due to pain. Bitter taste in mouth. NO dysphagia. He was able to eat clear liquids today without abdominal pain.      Last Colonoscopy:12/13/16- The examined portion of the ileum was normal. - One 8 mm polyp in the cecum,  - One 3 mm polyp in the ascending colon, - Five 4 to 7 mm polyps in the ascending colon,  - A single colonic angiodysplastic lesion. Treated with argon beam coagulation. - Three 4 to 7 mm polyps in the transverse colon - One 8 mm polyp in the descending colon,  - Mild diverticulosis in the sigmoid colon, in the descending colon and in the transverse colon. There was narrowing of the colon in association with the diverticular opening. - Internal hemorrhoids. Last Endoscopy:12/13/16- Normal esophagus. - Normal stomach. - Normal examined duodenum. - No specimens collected. Capsule endoscopy:08/29/17 r/t IDA,  AVMs   Past Medical History:  Diagnosis Date   Aortic stenosis    Mild February 2018   Arthritis    Atrial fibrillation [facility])    Diagnosed January 2018   AVM (arteriovenous malformation)    Carotid stenosis    Diverticulosis    Essential hypertension    Gout    Hx of adenomatous colonic polyps    Hyperlipidemia    IDA (iron deficiency anemia)    Internal hemorrhoids    Rheumatic fever 1937   Stroke ([place]) 09/10/2016   Tubular adenoma of colon     Past Surgical History:  Procedure Laterality Date   CATARACT EXTRACTION W/PHACO Right 08/02/2014   Procedure: CATARACT EXTRACTION PHACO AND INTRAOCULAR LENS PLACEMENT; CDE:  12.77;  Surgeon: Williams Che, MD;  Location: [facility];  Service: Ophthalmology;  Laterality: Right;   CATARACT EXTRACTION W/PHACO Left 02/15/2015   Procedure: CATARACT EXTRACTION PHACO AND INTRAOCULAR LENS PLACEMENT (IOC);  Surgeon: Williams Che, MD;  Location: [facility];  Service: Ophthalmology;  Laterality: Left;  CDE 18.00   COLONOSCOPY     COLONOSCOPY WITH PROPOFOL N/A 12/13/2016   ileum normal, polyp in cecum, ascending colon-multiple, transverse colon, diverticulosis in sigmoid descending and transverse, narrowing of colon in assoc with diverticular opening, internal hemorrhoids   ELBOW SURGERY Right    "grissle in there"   ENDARTERECTOMY Left 09/17/2016   Procedure: LEFT CAROTID ENDARTECTOMY;  Surgeon: Waynetta Sandy, MD;  Location: [place];  Service: Vascular;  Laterality: Left;   ESOPHAGEAL DILATION  09/03/2013   Procedure: ESOPHAGEAL DILATION;  Surgeon: Rogene Houston, MD;  [facility];  Service: Endoscopy;;   ESOPHAGOGASTRODUODENOSCOPY N/A 09/03/2013   Procedure: ESOPHAGOGASTRODUODENOSCOPY (EGD);  Surgeon: Rogene Houston, MD;  [facility];  Service: Endoscopy;  Laterality: N/A;  255-rescheduled to 1030 Ann to notify pt   ESOPHAGOGASTRODUODENOSCOPY (EGD) WITH PROPOFOL N/A 12/13/2016   normal, no specimens   GIVENS  CAPSULE STUDY N/A 08/17/2017   AVMs   PATCH ANGIOPLASTY Left 09/17/2016   Procedure: PATCH ANGIOPLASTY WITH Rueben Bash BIOLOGIC PATCH 1 CM X6 CM;  Surgeon: Waynetta Sandy, MD;  Location: [place];  Service: Vascular;  Laterality: Left;    Prior to Admission medications   Medication Sig Start Date End Date Taking? Authorizing Provider  acetaminophen (TYLENOL) 325 MG tablet Take 650 mg by mouth daily as needed for mild pain or headache.    Yes [provider]  allopurinol (ZYLOPRIM) 300 MG tablet Take 300 mg by mouth daily.   Yes [provider]  alum & mag hydroxide-simeth (MAALOX/MYLANTA) 200-200-20 MG/5ML suspension Take by mouth every 6 (six) hours as needed for indigestion or heartburn.   Yes [provider]  amiodarone (PACERONE) 200 MG tablet Take 0.5 tablets (100 mg total) by mouth daily. 05/07/17  Yes Satira Sark, MD  apixaban (ELIQUIS) 2.5 MG TABS tablet TAKE 1 TABLET BY MOUTH TWICE A DAY 08/15/21  Yes Satira Sark, MD  atorvastatin (LIPITOR) 40 MG tablet Take 1 tablet (40 mg total) by mouth daily at 6 PM. 09/19/16  Yes Trinh, Janalyn Harder, PA-C  colchicine 0.6 MG tablet Take 0.6 mg by mouth 2 (two) times daily as needed (gout).   Yes [provider]  ezetimibe (ZETIA) 10 MG tablet Take 10 mg by mouth daily.   Yes [provider]  FeFum-FePoly-FA-B Cmp-C-Biot (FOLIVANE-PLUS) CAPS TAKE 1 CAPSULE BY MOUTH EVERY DAY IN THE MORNING Patient taking differently: Take 1 capsule by mouth daily. 10/29/18  Yes Pyrtle, Lajuan Lines, MD  fish oil-omega-3 fatty acids 1000 MG capsule Take 1 g by mouth daily.   Yes [provider]  furosemide (LASIX) 20 MG tablet Take 20 mg by mouth daily.   Yes [provider]  metoprolol tartrate (LOPRESSOR) 25 MG tablet Take 0.5 tablets (12.5 mg total) by mouth 2 (two) times daily. 01/08/17 09/10/21 Yes Satira Sark, MD  pantoprazole (PROTONIX) 40 MG tablet Take 1 tablet (40 mg total) by mouth 2  (two) times daily. Patient taking differently: Take by mouth daily. 07/03/21 09/10/21 Yes Carlan, Chelsea L, NP  prochlorperazine (COMPAZINE) 5 MG tablet Take 1 tablet (5 mg total) by mouth every 8 (eight) hours as needed for nausea or vomiting. 08/21/21  Yes Harvel Quale, MD  ondansetron (ZOFRAN) 4 MG tablet Take 1 tablet (4 mg total) by mouth every 8 (eight) hours as needed for nausea or vomiting. Patient not taking: Reported on 09/10/2021 08/16/21   Harvel Quale, MD  sucralfate (CARAFATE) 1 GM/10ML suspension Take 10 mLs (1 g total) by mouth 4 (four) times daily -  with meals and at bedtime. Patient not taking: Reported on 09/10/2021 07/03/21   Gabriel Rung, NP    Current Facility-Administered Medications  Medication Dose Route Frequency Provider Last Rate Last Admin   [START ON 09/12/2021] amiodarone (PACERONE) tablet 100 mg  100 mg Oral Daily Vann, Jessica U, DO       feeding supplement (ENSURE ENLIVE / ENSURE PLUS) liquid 237 mL  237 mL Oral BID BM Adefeso, Oladapo, DO       morphine 2 MG/ML injection  2 mg  2 mg Intravenous Q4H PRN Adefeso, Oladapo, DO   2 mg at 09/11/21 0129   pantoprazole (PROTONIX) injection 40 mg  40 mg Intravenous Q12H Adefeso, Oladapo, DO   40 mg at 09/11/21 0848   piperacillin-tazobactam (ZOSYN) IVPB 3.375 g  3.375 g Intravenous Q8H Adefeso, Oladapo, DO 12.5 mL/hr at 09/11/21 0500 3.375 g at 09/11/21 0500   prochlorperazine (COMPAZINE) injection 10 mg  10 mg Intravenous Q6H PRN Adefeso, Oladapo, DO        Allergies as of 09/10/2021   (No Known Allergies)    Family History  Problem Relation Age of Onset   CVA Mother 67   Pneumonia Father 23   Colon cancer Neg Hx     Social History   Socioeconomic History   Marital status: Married    Spouse name: Not on file   Number of children: 1   Years of education: Not on file   Highest education level: Not on file  Occupational History   Occupation: Retired  Tobacco Use   Smoking  status: Former    Packs/day: 1.00    Years: 20.00    Pack years: 20.00    Types: Cigarettes    Quit date: 1980    Years since quitting: 43.1   Smokeless tobacco: Never  Vaping Use   Vaping Use: Never used  Substance and Sexual Activity   Alcohol use: No   Drug use: No   Sexual activity: Not on file  Other Topics Concern   Not on file  Social History Narrative   Not on file   Social Determinants of Health   Financial Resource Strain: Not on file  Food Insecurity: Not on file  Transportation Needs: Not on file  Physical Activity: Not on file  Stress: Not on file  Social Connections: Not on file  Intimate Partner Violence: Not on file    Review of Systems: Gen: see HPI  CV: Denies chest pain, heart palpitations, syncope, edema  Resp: Denies shortness of breath with rest, cough, wheezing GI: see HPI GU : Denies urinary burning, urinary frequency, urinary incontinence.  MS: Denies joint pain,swelling, cramping Derm: Denies rash, itching, dry skin Psych: Denies depression, anxiety,confusion, or memory loss Heme: see HPI  Physical Exam: Vital signs in last 24 hours: Temp:  [97.8 F (36.6 C)-98 F (36.7 C)] 98 F (36.7 C) (01/30 0601) Pulse Rate:  [65-75] 66 (01/30 0601) Resp:  [13-24] 19 (01/30 0601) BP: (94-126)/(60-91) 94/62 (01/30 0601) SpO2:  [97 %-100 %] 97 % (01/30 0601) Weight:  [88.5 kg] 88.5 kg (01/29 1845)   General:   Alert,  Well-developed, well-nourished, pleasant and cooperative in NAD Head:  Normocephalic and atraumatic. Eyes:  Sclera clear, no icterus.   Conjunctiva pink. Ears:  hard of hearing Lungs:  Clear throughout to auscultation.    Heart:  S1 S2 present Abdomen:  Soft, TTP epigastric and LUQ and nondistended. No masses, hepatosplenomegaly or hernias noted. Normal bowel sounds, without guarding, and without rebound.   Rectal:  Deferred  Msk:  Symmetrical without gross deformities. Normal posture. Pulses:  Normal pulses noted. Extremities:   Without edema. Neurologic:  Alert and  oriented x4 Psych:  Alert and cooperative. Normal mood and affect.  Intake/Output from previous day: 01/29 0701 - 01/30 0700 In: 255 [I.V.:225; IV Piggyback:30] Out: -  Intake/Output this shift: Total I/O In: 480 [P.O.:480] Out: -   Lab Results: Recent Labs    09/10/21 1907 09/11/21 0454  WBC 6.2 6.0  HGB 13.3 12.3*  HCT 41.4 38.3*  PLT 140* 128*   BMET Recent Labs    09/10/21 1907 09/11/21 0454  NA 137 140  K 4.3 4.0  CL 108 110  CO2 24 24  GLUCOSE 117* 105*  BUN 45* 45*  CREATININE 2.33* 2.22*  CALCIUM 9.0 8.8*   LFT Recent Labs    09/10/21 1907 09/11/21 0454  PROT 6.7 6.0*  ALBUMIN 3.4* 3.0*  AST 42* 36  ALT 28 25  ALKPHOS 84 73  BILITOT 1.5* 1.3*   Lab Results  Component Value Date   IRON 41 (L) 08/16/2021   TIBC 344 08/16/2021   FERRITIN 76 08/16/2021     Studies/Results: CT ABDOMEN PELVIS WO CONTRAST  Result Date: 09/10/2021 CLINICAL DATA:  Central abdominal pain for 6 months. Nausea and vomiting. EXAM: CT ABDOMEN AND PELVIS WITHOUT CONTRAST TECHNIQUE: Multidetector CT imaging of the abdomen and pelvis was performed following the standard protocol without IV contrast. RADIATION DOSE REDUCTION: This exam was performed according to the departmental dose-optimization program which includes automated exposure control, adjustment of the mA and/or kV according to patient size and/or use of iterative reconstruction technique. COMPARISON:  None. FINDINGS: Lower chest: Small to moderate bilateral pleural effusions are seen with mild compressive atelectasis in the lung bases. Moderate cardiomegaly, without pericardial effusion. Hepatobiliary: No mass visualized on this unenhanced exam. Mild left lobe hypertrophy and capsular nodularity raises suspicion for cirrhosis. Mild ascites in abdomen and pelvis. Gallbladder is unremarkable. No evidence of biliary ductal dilatation. Pancreas: No mass or inflammatory process  visualized on this unenhanced exam. Spleen:  Within normal limits in size. Adrenals/Urinary tract: Multiple small less than 1 cm renal calculi are seen bilaterally. No evidence of ureteral calculi or hydronephrosis. Mild diffuse bladder wall thickening may be due to chronic bladder outlet obstruction or cystitis. Stomach/Bowel: Extensive colonic diverticulosis is noted. A focal area of wall thickening is seen involving the mid sigmoid colon with hazy opacity in the adjacent sigmoid mesocolon, consistent with mild diverticulitis. No evidence of bowel perforation or abscess. Vascular/Lymphatic: No pathologically enlarged lymph nodes identified. No evidence of abdominal aortic aneurysm. Aortic atherosclerotic calcification noted. Reproductive:  No mass or other significant abnormality. Other:  None. Musculoskeletal:  No suspicious bone lesions identified. IMPRESSION: Mild sigmoid diverticulitis. No evidence of bowel perforation or abscess. Bilateral nephrolithiasis. No evidence of ureteral calculi or hydronephrosis. Mild diffuse bladder wall thickening, which may be due to chronic bladder outlet obstruction or cystitis. Mild ascites. Possible hepatic cirrhosis. Recommend correlation with liver function tests and serology. Small to moderate bilateral pleural effusions and bibasilar atelectasis. Electronically Signed   By: Marlaine Hind M.D.   On: 09/10/2021 21:34   DG Chest Portable 1 View  Result Date: 09/10/2021 CLINICAL DATA:  Shortness of breath EXAM: PORTABLE CHEST 1 VIEW COMPARISON:  04/13/2021 FINDINGS: Cardiac shadow is mildly prominent but stable. Aortic calcifications are seen. Lungs are well aerated bilaterally. Mild atelectasis/scarring is noted in the bases bilaterally. Mild central vascular prominence is noted. Changes of prior granulomatous disease are again seen. IMPRESSION: Stable bibasilar atelectasis/scarring. Prior granulomatous disease. Mild vascular congestion. Electronically Signed   By: Inez Catalina M.D.   On: 09/10/2021 19:36    Impression: 88 y.o. 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 New finding of possible hepatic cirrhosis.   Diverticulitis: tolerated clear liquids today at lunch without difficulty. Will advance to soft foods. Continue with antibiotics.   Epigastric pain: agree with need for CTA to assess for chronic mesenteric ischemia,  but renal function will not allow this currently. Would consider cardiology clearance prior to EGD as previously outlined in Dr. Colman Cater noted on 08/16/21. ECHO today with EF of 20 to 25%, which is decreased from 2019 (previously 45-50%). If continues to improve and tolerating diet as inpatient, would continue treatment for diverticulitis and consider pursuing additional evaluations as outpatient. If persistent pain, will need CTA once renal function allows +/- EGD.  Possible cirrhosis on imaging: mild thrombocytopenia. He also has a macrocytic anemia noted. Suspect due to fatty liver. LFTs normal except for mildly increased Tbili at 1.3.  Check B12 and folate panel.   As of note, patient was on Eliquis as outpatient. Unclear last dose.    Plan: Soft diet for dinner Continue with diverticulitis treatment B12 and folate panel Recommend CTA when renal function allows EGD if cleared by cardiology: could consider as outpatient as long as abdominal pain resolves with treatment of diverticulitis Cirrhosis care as outpatient   Annitta Needs, PhD, ANP-BC [facility]      LOS: 1 day    09/11/2021, 11:40 AM

## 2021-09-12 DIAGNOSIS — R0902 Hypoxemia: Secondary | ICD-10-CM

## 2021-09-12 DIAGNOSIS — I5021 Acute systolic (congestive) heart failure: Secondary | ICD-10-CM | POA: Diagnosis not present

## 2021-09-12 DIAGNOSIS — I350 Nonrheumatic aortic (valve) stenosis: Secondary | ICD-10-CM | POA: Diagnosis not present

## 2021-09-12 DIAGNOSIS — N179 Acute kidney failure, unspecified: Secondary | ICD-10-CM | POA: Diagnosis not present

## 2021-09-12 DIAGNOSIS — K5792 Diverticulitis of intestine, part unspecified, without perforation or abscess without bleeding: Secondary | ICD-10-CM | POA: Diagnosis not present

## 2021-09-12 DIAGNOSIS — R932 Abnormal findings on diagnostic imaging of liver and biliary tract: Secondary | ICD-10-CM

## 2021-09-12 DIAGNOSIS — I4891 Unspecified atrial fibrillation: Secondary | ICD-10-CM

## 2021-09-12 LAB — CBC
HCT: 39 % (ref 39.0–52.0)
Hemoglobin: 12.7 g/dL — ABNORMAL LOW (ref 13.0–17.0)
MCH: 33.7 pg (ref 26.0–34.0)
MCHC: 32.6 g/dL (ref 30.0–36.0)
MCV: 103.4 fL — ABNORMAL HIGH (ref 80.0–100.0)
Platelets: 144 10*3/uL — ABNORMAL LOW (ref 150–400)
RBC: 3.77 MIL/uL — ABNORMAL LOW (ref 4.22–5.81)
RDW: 16.5 % — ABNORMAL HIGH (ref 11.5–15.5)
WBC: 6.2 10*3/uL (ref 4.0–10.5)
nRBC: 0 % (ref 0.0–0.2)

## 2021-09-12 LAB — FOLATE: Folate: 40.8 ng/mL (ref >5.9)

## 2021-09-12 LAB — BRAIN NATRIURETIC PEPTIDE: B Natriuretic Peptide: 1793 pg/mL — ABNORMAL HIGH (ref 0.0–100.0)

## 2021-09-12 LAB — COMPREHENSIVE METABOLIC PANEL
ALT: 23 U/L (ref 0–44)
AST: 34 U/L (ref 15–41)
Albumin: 2.9 g/dL — ABNORMAL LOW (ref 3.5–5.0)
Alkaline Phosphatase: 67 U/L (ref 38–126)
Anion gap: 8 (ref 5–15)
BUN: 41 mg/dL — ABNORMAL HIGH (ref 8–23)
CO2: 22 mmol/L (ref 22–32)
Calcium: 8.9 mg/dL (ref 8.9–10.3)
Chloride: 108 mmol/L (ref 98–111)
Creatinine, Ser: 2.15 mg/dL — ABNORMAL HIGH (ref 0.61–1.24)
GFR, Estimated: 29 mL/min — ABNORMAL LOW (ref >60)
Glucose, Bld: 98 mg/dL (ref 70–99)
Potassium: 5.2 mmol/L — ABNORMAL HIGH (ref 3.5–5.1)
Sodium: 138 mmol/L (ref 135–145)
Total Bilirubin: 1.2 mg/dL (ref 0.3–1.2)
Total Protein: 5.9 g/dL — ABNORMAL LOW (ref 6.5–8.1)

## 2021-09-12 MED ORDER — SODIUM ZIRCONIUM CYCLOSILICATE 5 G PO PACK
5.0000 g | PACK | Freq: Once | ORAL | Status: AC
  Administered 2021-09-12: 5 g via ORAL
  Filled 2021-09-12: qty 1

## 2021-09-12 MED ORDER — FUROSEMIDE 10 MG/ML IJ SOLN
40.0000 mg | Freq: Once | INTRAMUSCULAR | Status: AC
  Administered 2021-09-12: 40 mg via INTRAVENOUS
  Filled 2021-09-12: qty 4

## 2021-09-12 MED ORDER — ALUM & MAG HYDROXIDE-SIMETH 200-200-20 MG/5ML PO SUSP
30.0000 mL | Freq: Four times a day (QID) | ORAL | Status: DC | PRN
  Administered 2021-09-12: 30 mL via ORAL
  Filled 2021-09-12: qty 30

## 2021-09-12 MED ORDER — POLYETHYLENE GLYCOL 3350 17 G PO PACK
17.0000 g | PACK | Freq: Every day | ORAL | Status: DC
  Administered 2021-09-12 – 2021-09-15 (×3): 17 g via ORAL
  Filled 2021-09-12 (×4): qty 1

## 2021-09-12 MED ORDER — CALCIUM CARBONATE ANTACID 500 MG PO CHEW
1.0000 | CHEWABLE_TABLET | Freq: Once | ORAL | Status: AC
  Administered 2021-09-12: 200 mg via ORAL
  Filled 2021-09-12: qty 1

## 2021-09-12 MED ORDER — METOPROLOL SUCCINATE ER 25 MG PO TB24
12.5000 mg | ORAL_TABLET | Freq: Every day | ORAL | Status: DC
  Administered 2021-09-13 – 2021-09-15 (×3): 12.5 mg via ORAL
  Filled 2021-09-12 (×3): qty 1

## 2021-09-12 MED ORDER — SUCRALFATE 1 GM/10ML PO SUSP
1.0000 g | Freq: Three times a day (TID) | ORAL | Status: DC
  Administered 2021-09-12 – 2021-09-15 (×11): 1 g via ORAL
  Filled 2021-09-12 (×12): qty 10

## 2021-09-12 NOTE — Progress Notes (Addendum)
Progress Note    Ronald Colon  FIE:[id number] DOB: 06/02/1929  DOA: 09/10/2021 PCP: [name], MD      Medical records reviewed and are as summarized below:  Ronald Colon is an 88 y.o. 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 [name] (gastroenterology).  Patient reports worsening of the abdominal pain which was described as burning sensation with reflux into the midsternal chest area, pain was of a moderate intensity, it worsens after eating and was associated with nausea and occasional vomiting.  Echo done as planned for outpatient and EF found to be 20%.     Assessment/Plan:   Principal Problem:   Diverticulitis Active Problems:   GERD (gastroesophageal reflux disease)   Atrial fibrillation, chronic (HCC)   Hyperlipidemia, unspecified   Nausea with vomiting   Abdominal pain   Thrombocytopenia (HCC)   Hypoalbuminemia   AKI (acute kidney injury) ([place])   Respiratory failure with hypoxia (HCC)   Elevated MCV   Prolonged QT interval   Abdominal pain nausea, vomiting possibly secondary to multifactorial including gastritis, diverticulitis CT abdomen and pelvis was suggestive of mild sigmoid diverticulitis- but pain is upper and described as burning Continue IV Zosyn 3.375 q.8h Continue IV morphine 2 mg q.4h p.r.n. for moderate to severe pain Continue Protonix IV Continue IV Compazine p.r.n. Diet advanced to soft GI consult as work up has been planned out patient for CT scan with IV and EGD (needed echo prior)  Acute systolic CHF -echo shows EF 20%- new from 2019 -BNP 1793 -cards consult appreciated    GERD Continue Protonix  Acute kidney injury BUN/creatinine 45/2.33 (most recent lab for comparison was 1.4-1.6 about 4 years ago) Hold IVF as x ray shows  congestion- may actually need diuresis Renally adjust medications, avoid nephrotoxic agents/dehydration/hypotension  Elevated MCV MCV 102.2 B12> 1000  Thrombocytopenia possibly reactive Platelets 140- appears new -monitor closely  Hyperkalemia Lokelma x1 -no hemolysis seen -BMP in AM  Prolonged QTc  -monitor on tele  Does not appear to be malnourished   Hyperlipidemia Continue Lipitor and Zetia  Hypertension -per cards  Chronic atrial fibrillation Continue Eliquis and Lopressor -resume amiodarone- cards may change to coreg   Family Communication/Anticipated D/C date and plan/Code Status   DVT prophylaxis: scd Code Status: Full Code.  Disposition Plan: Status is: Inpatient  Remains inpatient appropriate because: new EF of 20%         Medical Consultants:   GI cards  Subjective:   Pain improved but patient concerned about why he has been having and how to prevent  Objective:    Vitals:   09/11/21 0601 09/11/21 1502 09/11/21 2223 09/12/21 0444  BP: 94/62 (!) 100/57 91/62 118/76  Pulse: 66 66 (!) 58 74  Resp: 19  18 18   Temp: 98 F (36.7 C) 98 F (36.7 C) 97.7 F (36.5 C) 97.9 F (36.6 C)  TempSrc:  Oral    SpO2: 97% 96% 90% 98%  Weight:      Height:        Intake/Output Summary (Last 24 hours) at 09/12/2021 1000 Last data filed at 09/12/2021 0934 Gross per 24 hour  Intake 1668.39 ml  Output 300 ml  Net 1368.39 ml   Filed Weights   09/10/21 1845  Weight: 88.5 kg    Exam:  General: Appearance:     Overweight male in no acute distress     Lungs:     respirations unlabored  Heart:    Normal heart rate. irregular  MS:   All extremities are intact.    Neurologic:   Awake, alert, pleasant and cooperative     Data Reviewed:   I have personally reviewed following labs and imaging studies:  Labs: Labs show the following:   Basic Metabolic Panel: Recent Labs  Lab 09/10/21 1907 09/11/21 0454 09/12/21 0544  NA 137 140 138   K 4.3 4.0 5.2*  CL 108 110 108  CO2 24 24 22   GLUCOSE 117* 105* 98  BUN 45* 45* 41*  CREATININE 2.33* 2.22* 2.15*  CALCIUM 9.0 8.8* 8.9  MG  --  2.5*  --   PHOS  --  4.1  --    GFR Estimated Creatinine Clearance: 26.6 mL/min (A) (by C-G formula based on SCr of 2.15 mg/dL (H)). Liver Function Tests: Recent Labs  Lab 09/10/21 1907 09/11/21 0454 09/12/21 0544  AST 42* 36 34  ALT 28 25 23   ALKPHOS 84 73 67  BILITOT 1.5* 1.3* 1.2  PROT 6.7 6.0* 5.9*  ALBUMIN 3.4* 3.0* 2.9*   Recent Labs  Lab 09/10/21 1907  LIPASE 49   No results for input(s): AMMONIA in the last 168 hours. Coagulation profile No results for input(s): INR, PROTIME in the last 168 hours.  CBC: Recent Labs  Lab 09/10/21 1907 09/11/21 0454 09/12/21 0544  WBC 6.2 6.0 6.2  NEUTROABS 4.6  --   --   HGB 13.3 12.3* 12.7*  HCT 41.4 38.3* 39.0  MCV 102.2* 103.8* 103.4*  PLT 140* 128* 144*   Cardiac Enzymes: No results for input(s): CKTOTAL, CKMB, CKMBINDEX, TROPONINI in the last 168 hours. BNP (last 3 results) No results for input(s): PROBNP in the last 8760 hours. CBG: No results for input(s): GLUCAP in the last 168 hours. D-Dimer: No results for input(s): DDIMER in the last 72 hours. Hgb A1c: No results for input(s): HGBA1C in the last 72 hours. Lipid Profile: No results for input(s): CHOL, HDL, LDLCALC, TRIG, CHOLHDL, LDLDIRECT in the last 72 hours. Thyroid function studies: No results for input(s): TSH, T4TOTAL, T3FREE, THYROIDAB in the last 72 hours.  Invalid input(s): FREET3 Anemia work up: Recent Labs    09/11/21 0454 09/11/21 1900  VITAMINB12 1,056* 1,055*  FOLATE 38.1 40.8   Sepsis Labs: Recent Labs  Lab 09/10/21 1907 09/10/21 1959 09/10/21 2114 09/11/21 0454 09/12/21 0544  WBC 6.2  --   --  6.0 6.2  LATICACIDVEN  --  1.4 1.7  --   --     Microbiology Recent Results (from the past 240 hour(s))  Resp Panel by RT-PCR (Flu A&B, Covid) Nasopharyngeal Swab     Status: None    Collection Time: 09/10/21 11:09 PM   Specimen: Nasopharyngeal Swab; Nasopharyngeal(NP) swabs in vial transport medium  Result Value Ref Range Status   SARS Coronavirus 2 by RT PCR NEGATIVE NEGATIVE Final    Comment: (NOTE) SARS-CoV-2 target nucleic acids are NOT DETECTED.  The SARS-CoV-2 RNA is generally detectable in upper respiratory specimens during the acute phase of infection. The lowest concentration of SARS-CoV-2 viral copies this assay can detect is 138 copies/mL. A negative result does not preclude SARS-Cov-2 infection and should not be used as the sole basis for treatment or other patient management decisions. A negative result may occur with  improper specimen collection/handling, submission of specimen other  than nasopharyngeal swab, presence of viral mutation(s) within the areas targeted by this assay, and inadequate number of viral copies(<138 copies/mL). A negative result must be combined with clinical observations, patient history, and epidemiological information. The expected result is Negative.  Fact Sheet for Patients:  EntrepreneurPulse.com.au  Fact Sheet for Healthcare Providers:  IncredibleEmployment.be  This test is no t yet approved or cleared by the [place] FDA and  has been authorized for detection and/or diagnosis of SARS-CoV-2 by FDA under an Emergency Use Authorization (EUA). This EUA will remain  in effect (meaning this test can be used) for the duration of the COVID-19 declaration under Section 564(b)(1) of the Act, 21 U.S.C.section 360bbb-3(b)(1), unless the authorization is terminated  or revoked sooner.       Influenza A by PCR NEGATIVE NEGATIVE Final   Influenza B by PCR NEGATIVE NEGATIVE Final    Comment: (NOTE) The Xpert Xpress SARS-CoV-2/FLU/RSV plus assay is intended as an aid in the diagnosis of influenza from Nasopharyngeal swab specimens and should not be used as a sole basis for treatment.  Nasal washings and aspirates are unacceptable for Xpert Xpress SARS-CoV-2/FLU/RSV testing.  Fact Sheet for Patients: EntrepreneurPulse.com.au  Fact Sheet for Healthcare Providers: IncredibleEmployment.be  This test is not yet approved or cleared by the [place] FDA and has been authorized for detection and/or diagnosis of SARS-CoV-2 by FDA under an Emergency Use Authorization (EUA). This EUA will remain in effect (meaning this test can be used) for the duration of the COVID-19 declaration under Section 564(b)(1) of the Act, 21 U.S.C. section 360bbb-3(b)(1), unless the authorization is terminated or revoked.  Performed at [facility], [address] [place], [place] [address]     Procedures and diagnostic studies:  CT ABDOMEN PELVIS WO CONTRAST  Result Date: 09/10/2021 CLINICAL DATA:  Central abdominal pain for 6 months. Nausea and vomiting. EXAM: CT ABDOMEN AND PELVIS WITHOUT CONTRAST TECHNIQUE: Multidetector CT imaging of the abdomen and pelvis was performed following the standard protocol without IV contrast. RADIATION DOSE REDUCTION: This exam was performed according to the departmental dose-optimization program which includes automated exposure control, adjustment of the mA and/or kV according to patient size and/or use of iterative reconstruction technique. COMPARISON:  None. FINDINGS: Lower chest: Small to moderate bilateral pleural effusions are seen with mild compressive atelectasis in the lung bases. Moderate cardiomegaly, without pericardial effusion. Hepatobiliary: No mass visualized on this unenhanced exam. Mild left lobe hypertrophy and capsular nodularity raises suspicion for cirrhosis. Mild ascites in abdomen and pelvis. Gallbladder is unremarkable. No evidence of biliary ductal dilatation. Pancreas: No mass or inflammatory process visualized on this unenhanced exam. Spleen:  Within normal limits in size. Adrenals/Urinary tract:  Multiple small less than 1 cm renal calculi are seen bilaterally. No evidence of ureteral calculi or hydronephrosis. Mild diffuse bladder wall thickening may be due to chronic bladder outlet obstruction or cystitis. Stomach/Bowel: Extensive colonic diverticulosis is noted. A focal area of wall thickening is seen involving the mid sigmoid colon with hazy opacity in the adjacent sigmoid mesocolon, consistent with mild diverticulitis. No evidence of bowel perforation or abscess. Vascular/Lymphatic: No pathologically enlarged lymph nodes identified. No evidence of abdominal aortic aneurysm. Aortic atherosclerotic calcification noted. Reproductive:  No mass or other significant abnormality. Other:  None. Musculoskeletal:  No suspicious bone lesions identified. IMPRESSION: Mild sigmoid diverticulitis. No evidence of bowel perforation or abscess. Bilateral nephrolithiasis. No evidence of ureteral calculi or hydronephrosis. Mild diffuse bladder wall thickening, which may be due to chronic bladder outlet  obstruction or cystitis. Mild ascites. Possible hepatic cirrhosis. Recommend correlation with liver function tests and serology. Small to moderate bilateral pleural effusions and bibasilar atelectasis. Electronically Signed   By: Marlaine Hind M.D.   On: 09/10/2021 21:34   DG Chest Portable 1 View  Result Date: 09/10/2021 CLINICAL DATA:  Shortness of breath EXAM: PORTABLE CHEST 1 VIEW COMPARISON:  04/13/2021 FINDINGS: Cardiac shadow is mildly prominent but stable. Aortic calcifications are seen. Lungs are well aerated bilaterally. Mild atelectasis/scarring is noted in the bases bilaterally. Mild central vascular prominence is noted. Changes of prior granulomatous disease are again seen. IMPRESSION: Stable bibasilar atelectasis/scarring. Prior granulomatous disease. Mild vascular congestion. Electronically Signed   By: Inez Catalina M.D.   On: 09/10/2021 19:36   ECHOCARDIOGRAM COMPLETE  Result Date: 09/11/2021     ECHOCARDIOGRAM REPORT   Patient Name:   Ronald Colon Date of Exam: 09/11/2021 Medical Rec #:  [id number]     Height:       74.0 in Accession #:    [id number]    Weight:       195.0 lb Date of Birth:  1929-03-15     BSA:          2.150 m Patient Age:    60 years      BP:           94/62 mmHg Patient Gender: M             HR:           66 bpm. Exam Location:  [facility] Procedure: 2D Echo, Cardiac Doppler, Color Doppler and Intracardiac            Opacification Agent Indications:    Dyspnea  History:        Patient has prior history of Echocardiogram examinations, most                 recent 08/17/2017. Stroke, Arrythmias:Atrial Fibrillation; Risk                 Factors:Hypertension, Dyslipidemia and Former Smoker.  Sonographer:    Wenda Low Referring Phys: Celina Comments: Technically difficult study due to poor echo windows. IMPRESSIONS  1. Left ventricular ejection fraction, by estimation, is 20 to 25%. The left ventricle has severely decreased function. The left ventricle demonstrates global hypokinesis. The left ventricular internal cavity size was moderately dilated. There is mild concentric left ventricular hypertrophy. Left ventricular diastolic parameters are consistent with Grade III diastolic dysfunction (restrictive).  2. Right ventricular systolic function is mildly reduced. The right ventricular size is mildly-to-moderately enlarged. There is moderately elevated pulmonary artery systolic pressure. The estimated right ventricular systolic pressure is 38.8 mmHg.  3. Left atrial size was severely dilated.  4. Right atrial size was severely dilated.  5. The mitral valve is abnormal. Mild mitral valve regurgitation. Moderate mitral annular calcification.  6. Tricuspid valve regurgitation is mild to moderate.  7. The aortic valve is tricuspid. There is moderate calcification of the aortic valve. There is moderate thickening of the aortic valve. Aortic valve regurgitation is  trivial.  8. Aortic dilatation noted. There is mild dilatation of the aortic root, measuring 38 mm.  9. The inferior vena cava is dilated in size with <50% respiratory variability, suggesting right atrial pressure of 15 mmHg. Comparison(s): Compared to prior TTE in 2019, the LVEF is no severely reduced at 20-25% (previously 45-50%). FINDINGS  Left Ventricle: Left ventricular ejection fraction, by estimation,  is 20 to 25%. The left ventricle has severely decreased function. The left ventricle demonstrates global hypokinesis. Definity contrast agent was given IV to delineate the left ventricular endocardial borders. The left ventricular internal cavity size was moderately dilated. There is mild concentric left ventricular hypertrophy. Left ventricular diastolic parameters are consistent with Grade III diastolic dysfunction (restrictive). Right Ventricle: The right ventricular size is mildly-to-moderately enlarged. Right vetricular wall thickness was not well visualized. Right ventricular systolic function is mildly reduced. There is moderately elevated pulmonary artery systolic pressure.  The tricuspid regurgitant velocity is 2.90 m/s, and with an assumed right atrial pressure of 15 mmHg, the estimated right ventricular systolic pressure is 13.2 mmHg. Left Atrium: Left atrial size was severely dilated. Right Atrium: Right atrial size was severely dilated. Pericardium: There is no evidence of pericardial effusion. Mitral Valve: The mitral valve is abnormal. There is mild thickening of the mitral valve leaflet(s). There is mild calcification of the mitral valve leaflet(s). Moderate mitral annular calcification. Mild mitral valve regurgitation. MV peak gradient, 5.2  mmHg. The mean mitral valve gradient is 1.0 mmHg. Tricuspid Valve: The tricuspid valve is normal in structure. Tricuspid valve regurgitation is mild to moderate. Aortic Valve: Suspect moderate low flow, low gradient aortic stenosis with AVA 0.91cm2, mean  gradient 56mmHg, Vmax 2.86m/s, DI 0.3, SVI 24. The aortic valve is tricuspid. There is moderate calcification of the aortic valve. There is moderate thickening of the aortic valve. Aortic valve regurgitation is trivial. Aortic valve mean gradient measures 13.0 mmHg. Aortic valve peak gradient measures 25.2 mmHg. Aortic valve area, by VTI measures 0.91 cm. Pulmonic Valve: The pulmonic valve was normal in structure. Pulmonic valve regurgitation is mild. Aorta: Aortic dilatation noted. There is mild dilatation of the aortic root, measuring 38 mm. Venous: The inferior vena cava is dilated in size with less than 50% respiratory variability, suggesting right atrial pressure of 15 mmHg. IAS/Shunts: The atrial septum is grossly normal.  LEFT VENTRICLE PLAX 2D LVIDd:         6.10 cm   Diastology LVIDs:         5.40 cm   LV e' medial:    3.60 cm/s LV PW:         1.20 cm   LV E/e' medial:  28.1 LV IVS:        1.40 cm   LV e' lateral:   6.70 cm/s LVOT diam:     2.00 cm   LV E/e' lateral: 15.1 LV SV:         50 LV SV Index:   23 LVOT Area:     3.14 cm  RIGHT VENTRICLE RV Basal diam:  4.00 cm RV Mid diam:    3.80 cm RV S prime:     6.13 cm/s TAPSE (M-mode): 1.8 cm LEFT ATRIUM              Index        RIGHT ATRIUM           Index LA diam:        4.50 cm  2.09 cm/m   RA Area:     30.80 cm LA Vol (A2C):   116.0 ml 53.95 ml/m  RA Volume:   110.00 ml 51.16 ml/m LA Vol (A4C):   105.0 ml 48.83 ml/m LA Biplane Vol: 113.0 ml 52.55 ml/m  AORTIC VALVE                     PULMONIC VALVE  AV Area (Vmax):    0.94 cm      PV Vmax:       0.54 m/s AV Area (Vmean):   0.89 cm      PV Peak grad:  1.2 mmHg AV Area (VTI):     0.91 cm AV Vmax:           251.00 cm/s AV Vmean:          168.000 cm/s AV VTI:            0.547 m AV Peak Grad:      25.2 mmHg AV Mean Grad:      13.0 mmHg LVOT Vmax:         75.10 cm/s LVOT Vmean:        47.800 cm/s LVOT VTI:          0.158 m LVOT/AV VTI ratio: 0.29  AORTA Ao Root diam: 3.80 cm Ao Asc diam:  3.20 cm  MITRAL VALVE                TRICUSPID VALVE MV Area (PHT): 4.89 cm     TR Peak grad:   33.6 mmHg MV Area VTI:   2.20 cm     TR Vmax:        290.00 cm/s MV Peak grad:  5.2 mmHg MV Mean grad:  1.0 mmHg     SHUNTS MV Vmax:       1.14 m/s     Systemic VTI:  0.16 m MV Vmean:      54.2 cm/s    Systemic Diam: 2.00 cm MV Decel Time: 155 msec MV E velocity: 101.00 cm/s MV A velocity: 31.30 cm/s MV E/A ratio:  3.23 Gwyndolyn Kaufman MD Electronically signed by Gwyndolyn Kaufman MD Signature Date/Time: 09/11/2021/4:45:31 PM    Final     Medications:    amiodarone  100 mg Oral Daily   apixaban  2.5 mg Oral BID   atorvastatin  40 mg Oral q1800   ezetimibe  10 mg Oral Daily   feeding supplement  237 mL Oral BID BM   metoprolol tartrate  12.5 mg Oral BID   pantoprazole (PROTONIX) IV  40 mg Intravenous Q12H   Continuous Infusions:  piperacillin-tazobactam (ZOSYN)  IV 3.375 g (09/12/21 0513)     LOS: 2 days   Geradine Girt  [facility]   How to contact the [facility] Attending or Consulting provider [place] or covering provider during after hours [place], for this patient?  Check the care team in [facility] and look for a) attending/consulting [facility] provider listed and b) the [facility] team listed Log into [email or web address] and use [place]'s universal password to access. If you do not have the password, please contact the hospital operator. Locate the [facility] provider you are looking for under [facility] and page to a number that you can be directly reached. If you still have difficulty reaching the provider, please page the [facility] (Director on Call) for the Hospitalists listed on amion for assistance.  09/12/2021, 10:00 AM

## 2021-09-12 NOTE — Progress Notes (Signed)
Subjective: Continues with intermittent epigastric burning, postprandial, associated nausea/vomiting.  After he vomits, symptoms resolved and he feels better.  He uses Mylanta as needed which will help with the burning.  Denies any radiation of pain.  No LLQ abdominal pain. Had sausage this morning which caused a lot of burning.  Ate a couple bites of chicken for lunch and again had burning, then vomiting.  No hematemesis or coffee-ground emesis.   He has been having symptoms for about 7 months now.   Chronically dark stools on oral iron.  Also with toilet tissue hematochezia that started about 5 weeks ago with a total of about 5 episodes.  He has known history of internal hemorrhoids.  Denies rectal pain.  Reports history of bleeding ulcer in his stomach years ago.  Denies NSAIDs.  Objective: Vital signs in last 24 hours: Temp:  [97.7 F (36.5 C)-98 F (36.7 C)] 97.9 F (36.6 C) (01/31 0444) Pulse Rate:  [58-74] 74 (01/31 0444) Resp:  [18] 18 (01/31 0444) BP: (91-118)/(57-76) 118/76 (01/31 0444) SpO2:  [90 %-98 %] 98 % (01/31 0444) Last BM Date: 09/10/21 General:   Alert and oriented, pleasant, NAD Head:  Normocephalic and atraumatic. Eyes:  No icterus, sclera clear. Conjuctiva pink.  Abdomen:  Bowel sounds present, soft, non-distended.  Mild TTP in epigastric and RUQ region.  No HSM or hernias noted. No rebound or guarding. No masses appreciated  Msk:  Symmetrical without gross deformities. Normal posture. Extremities:  Without edema. Neurologic:  Alert and  oriented x4;  grossly normal neurologically. Skin:  Warm and dry, intact without significant lesions.  Psych:  Normal mood and affect.  Intake/Output from previous day: 01/30 0701 - 01/31 0700 In: 1788.4 [P.O.:1660; IV Piggyback:128.4] Out: 200 [Urine:200] Intake/Output this shift: Total I/O In: 360 [P.O.:360] Out: 100 [Urine:100]  Lab Results: Recent Labs    09/10/21 1907 09/11/21 0454 09/12/21 0544  WBC 6.2  6.0 6.2  HGB 13.3 12.3* 12.7*  HCT 41.4 38.3* 39.0  PLT 140* 128* 144*   BMET Recent Labs    09/10/21 1907 09/11/21 0454 09/12/21 0544  NA 137 140 138  K 4.3 4.0 5.2*  CL 108 110 108  CO2 24 24 22   GLUCOSE 117* 105* 98  BUN 45* 45* 41*  CREATININE 2.33* 2.22* 2.15*  CALCIUM 9.0 8.8* 8.9   LFT Recent Labs    09/10/21 1907 09/11/21 0454 09/12/21 0544  PROT 6.7 6.0* 5.9*  ALBUMIN 3.4* 3.0* 2.9*  AST 42* 36 34  ALT 28 25 23   ALKPHOS 84 73 67  BILITOT 1.5* 1.3* 1.2    Studies/Results: CT ABDOMEN PELVIS WO CONTRAST  Result Date: 09/10/2021 CLINICAL DATA:  Central abdominal pain for 6 months. Nausea and vomiting. EXAM: CT ABDOMEN AND PELVIS WITHOUT CONTRAST TECHNIQUE: Multidetector CT imaging of the abdomen and pelvis was performed following the standard protocol without IV contrast. RADIATION DOSE REDUCTION: This exam was performed according to the departmental dose-optimization program which includes automated exposure control, adjustment of the mA and/or kV according to patient size and/or use of iterative reconstruction technique. COMPARISON:  None. FINDINGS: Lower chest: Small to moderate bilateral pleural effusions are seen with mild compressive atelectasis in the lung bases. Moderate cardiomegaly, without pericardial effusion. Hepatobiliary: No mass visualized on this unenhanced exam. Mild left lobe hypertrophy and capsular nodularity raises suspicion for cirrhosis. Mild ascites in abdomen and pelvis. Gallbladder is unremarkable. No evidence of biliary ductal dilatation. Pancreas: No mass or inflammatory process visualized on this  unenhanced exam. Spleen:  Within normal limits in size. Adrenals/Urinary tract: Multiple small less than 1 cm renal calculi are seen bilaterally. No evidence of ureteral calculi or hydronephrosis. Mild diffuse bladder wall thickening may be due to chronic bladder outlet obstruction or cystitis. Stomach/Bowel: Extensive colonic diverticulosis is noted. A  focal area of wall thickening is seen involving the mid sigmoid colon with hazy opacity in the adjacent sigmoid mesocolon, consistent with mild diverticulitis. No evidence of bowel perforation or abscess. Vascular/Lymphatic: No pathologically enlarged lymph nodes identified. No evidence of abdominal aortic aneurysm. Aortic atherosclerotic calcification noted. Reproductive:  No mass or other significant abnormality. Other:  None. Musculoskeletal:  No suspicious bone lesions identified. IMPRESSION: Mild sigmoid diverticulitis. No evidence of bowel perforation or abscess. Bilateral nephrolithiasis. No evidence of ureteral calculi or hydronephrosis. Mild diffuse bladder wall thickening, which may be due to chronic bladder outlet obstruction or cystitis. Mild ascites. Possible hepatic cirrhosis. Recommend correlation with liver function tests and serology. Small to moderate bilateral pleural effusions and bibasilar atelectasis. Electronically Signed   By: Marlaine Hind M.D.   On: 09/10/2021 21:34   DG Chest Portable 1 View  Result Date: 09/10/2021 CLINICAL DATA:  Shortness of breath EXAM: PORTABLE CHEST 1 VIEW COMPARISON:  04/13/2021 FINDINGS: Cardiac shadow is mildly prominent but stable. Aortic calcifications are seen. Lungs are well aerated bilaterally. Mild atelectasis/scarring is noted in the bases bilaterally. Mild central vascular prominence is noted. Changes of prior granulomatous disease are again seen. IMPRESSION: Stable bibasilar atelectasis/scarring. Prior granulomatous disease. Mild vascular congestion. Electronically Signed   By: Inez Catalina M.D.   On: 09/10/2021 19:36   ECHOCARDIOGRAM COMPLETE  Result Date: 09/11/2021    ECHOCARDIOGRAM REPORT   Patient Name:   SPIKE DESILETS Date of Exam: 09/11/2021 Medical Rec #:  [id number]     Height:       74.0 in Accession #:    [id number]    Weight:       195.0 lb Date of Birth:  February 23, 1929     BSA:          2.150 m Patient Age:    88 years      BP:            94/62 mmHg Patient Gender: M             HR:           66 bpm. Exam Location:  [facility] Procedure: 2D Echo, Cardiac Doppler, Color Doppler and Intracardiac            Opacification Agent Indications:    Dyspnea  History:        Patient has prior history of Echocardiogram examinations, most                 recent 08/17/2017. Stroke, Arrythmias:Atrial Fibrillation; Risk                 Factors:Hypertension, Dyslipidemia and Former Smoker.  Sonographer:    Wenda Low Referring Phys: Cr[place] Comments: Technically difficult study due to poor echo windows. IMPRESSIONS  1. Left ventricular ejection fraction, by estimation, is 20 to 25%. The left ventricle has severely decreased function. The left ventricle demonstrates global hypokinesis. The left ventricular internal cavity size was moderately dilated. There is mild concentric left ventricular hypertrophy. Left ventricular diastolic parameters are consistent with Grade III diastolic dysfunction (restrictive).  2. Right ventricular systolic function is mildly reduced. The right ventricular size is mildly-to-moderately  enlarged. There is moderately elevated pulmonary artery systolic pressure. The estimated right ventricular systolic pressure is 02.7 mmHg.  3. Left atrial size was severely dilated.  4. Right atrial size was severely dilated.  5. The mitral valve is abnormal. Mild mitral valve regurgitation. Moderate mitral annular calcification.  6. Tricuspid valve regurgitation is mild to moderate.  7. The aortic valve is tricuspid. There is moderate calcification of the aortic valve. There is moderate thickening of the aortic valve. Aortic valve regurgitation is trivial.  8. Aortic dilatation noted. There is mild dilatation of the aortic root, measuring 38 mm.  9. The inferior vena cava is dilated in size with <50% respiratory variability, suggesting right atrial pressure of 15 mmHg. Comparison(s): Compared to prior TTE in 2019, the LVEF is  no severely reduced at 20-25% (previously 45-50%). FINDINGS  Left Ventricle: Left ventricular ejection fraction, by estimation, is 20 to 25%. The left ventricle has severely decreased function. The left ventricle demonstrates global hypokinesis. Definity contrast agent was given IV to delineate the left ventricular endocardial borders. The left ventricular internal cavity size was moderately dilated. There is mild concentric left ventricular hypertrophy. Left ventricular diastolic parameters are consistent with Grade III diastolic dysfunction (restrictive). Right Ventricle: The right ventricular size is mildly-to-moderately enlarged. Right vetricular wall thickness was not well visualized. Right ventricular systolic function is mildly reduced. There is moderately elevated pulmonary artery systolic pressure.  The tricuspid regurgitant velocity is 2.90 m/s, and with an assumed right atrial pressure of 15 mmHg, the estimated right ventricular systolic pressure is 74.1 mmHg. Left Atrium: Left atrial size was severely dilated. Right Atrium: Right atrial size was severely dilated. Pericardium: There is no evidence of pericardial effusion. Mitral Valve: The mitral valve is abnormal. There is mild thickening of the mitral valve leaflet(s). There is mild calcification of the mitral valve leaflet(s). Moderate mitral annular calcification. Mild mitral valve regurgitation. MV peak gradient, 5.2  mmHg. The mean mitral valve gradient is 1.0 mmHg. Tricuspid Valve: The tricuspid valve is normal in structure. Tricuspid valve regurgitation is mild to moderate. Aortic Valve: Suspect moderate low flow, low gradient aortic stenosis with AVA 0.91cm2, mean gradient 25mmHg, Vmax 2.66m/s, DI 0.3, SVI 24. The aortic valve is tricuspid. There is moderate calcification of the aortic valve. There is moderate thickening of the aortic valve. Aortic valve regurgitation is trivial. Aortic valve mean gradient measures 13.0 mmHg. Aortic valve peak  gradient measures 25.2 mmHg. Aortic valve area, by VTI measures 0.91 cm. Pulmonic Valve: The pulmonic valve was normal in structure. Pulmonic valve regurgitation is mild. Aorta: Aortic dilatation noted. There is mild dilatation of the aortic root, measuring 38 mm. Venous: The inferior vena cava is dilated in size with less than 50% respiratory variability, suggesting right atrial pressure of 15 mmHg. IAS/Shunts: The atrial septum is grossly normal.  LEFT VENTRICLE PLAX 2D LVIDd:         6.10 cm   Diastology LVIDs:         5.40 cm   LV e' medial:    3.60 cm/s LV PW:         1.20 cm   LV E/e' medial:  28.1 LV IVS:        1.40 cm   LV e' lateral:   6.70 cm/s LVOT diam:     2.00 cm   LV E/e' lateral: 15.1 LV SV:         50 LV SV Index:   23 LVOT Area:  3.14 cm  RIGHT VENTRICLE RV Basal diam:  4.00 cm RV Mid diam:    3.80 cm RV S prime:     6.13 cm/s TAPSE (M-mode): 1.8 cm LEFT ATRIUM              Index        RIGHT ATRIUM           Index LA diam:        4.50 cm  2.09 cm/m   RA Area:     30.80 cm LA Vol (A2C):   116.0 ml 53.95 ml/m  RA Volume:   110.00 ml 51.16 ml/m LA Vol (A4C):   105.0 ml 48.83 ml/m LA Biplane Vol: 113.0 ml 52.55 ml/m  AORTIC VALVE                     PULMONIC VALVE AV Area (Vmax):    0.94 cm      PV Vmax:       0.54 m/s AV Area (Vmean):   0.89 cm      PV Peak grad:  1.2 mmHg AV Area (VTI):     0.91 cm AV Vmax:           251.00 cm/s AV Vmean:          168.000 cm/s AV VTI:            0.547 m AV Peak Grad:      25.2 mmHg AV Mean Grad:      13.0 mmHg LVOT Vmax:         75.10 cm/s LVOT Vmean:        47.800 cm/s LVOT VTI:          0.158 m LVOT/AV VTI ratio: 0.29  AORTA Ao Root diam: 3.80 cm Ao Asc diam:  3.20 cm MITRAL VALVE                TRICUSPID VALVE MV Area (PHT): 4.89 cm     TR Peak grad:   33.6 mmHg MV Area VTI:   2.20 cm     TR Vmax:        290.00 cm/s MV Peak grad:  5.2 mmHg MV Mean grad:  1.0 mmHg     SHUNTS MV Vmax:       1.14 m/s     Systemic VTI:  0.16 m MV Vmean:      54.2  cm/s    Systemic Diam: 2.00 cm MV Decel Time: 155 msec MV E velocity: 101.00 cm/s MV A velocity: 31.30 cm/s MV E/A ratio:  3.23 Gwyndolyn Kaufman MD Electronically signed by Gwyndolyn Kaufman MD Signature Date/Time: 09/11/2021/4:45:31 PM    Final     Assessment: 88 y.o. 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 New finding of possible hepatic cirrhosis.  Diverticulitis:  Clinically doing very well with this.  No LLQ abdominal pain.  More on the constipated side as he has not had a bowel movement in 3 days.  We will add MiraLAX daily.    Epigastric pain: 6+ months of postprandial epigastric burning with associated nausea and intermittent vomiting without hematemesis or coffee-ground emesis.  Patient reports history of bleeding ulcer many years ago.  Chronic dark stools on oral iron, hemoglobin stable this admission. He was on PPI daily outpatient.  Denies NSAIDs.  He is chronically on Eliquis, which has been continued this admission.  Previously recommended CT angiography to assess for chronic mesenteric ischemia, but renal function will not allow for this currently.  He would likely benefit from EGD to evaluate for esophagitis, gastritis, duodenitis, PUD, but would need cardiac clearance and discussion with anesthesia as echo completed this admission revealed EF of 20-25% which is decreased from 45-50% in 2019.  For now, we will continue PPI twice daily, add Carafate before meals and at bedtime, and continue soft diet as tolerated.  If he has significant trouble with his soft diet, may need to reduce back to clears.  Toilet tissue hematochezia: 5  episodes in the last 5 weeks, last occurred prior to admission.  Likely secondary to known internal hemorrhoids as he has been having some trouble with constipation/straining with bowel movements. Hemoglobin is stable in the upper 12 range.  Last colonoscopy in 2018 with multiple colon polyps removed (tubular adenomas and a sessile serrated polyp with low-grade dysplasia), single colonic angiodysplastic lesion s/p APC therapy, diverticulosis, internal hemorrhoids.   Possible cirrhosis on imaging: Mild left lobe hypertrophy and capsular nodularity raising suspicion for cirrhosis.  Spleen is within normal limits.  He does have mild thrombocytopenia, new this admission.  LFTs within normal limits yesterday.  Slightly elevated bilirubin at 1.3.  No symptoms of decompensated disease.  Could have cirrhosis due to fatty liver though not sure he truly has cirrhosis.  Consider elastography.  Can be worked up outpatient.  Plan: Continue soft diet for now.  May need to decrease back to full liquids or clear liquids if persistent postprandial upper abdominal pain with nausea/vomiting. Continue antibiotics for diverticulitis. MiraLAX 17 g daily. Continue PPI twice daily. Add Carafate 3 times daily before meals and at bedtime. CTA when renal function allows. Consider EGD this admission to further evaluate epigastric pain and nausea/vomiting.  Will need to have clearance from cardiology and discussion with anesthesia in light of reduced EF. Will also discuss with Dr. Laural Golden He would need to hold Eliquis x48 hours. Continue supportive measures.  Cirrhosis care outpatient. Consider [place] elastography.     LOS: 2 days    09/12/2021, 1:20 PM   Aliene Altes, [facility]

## 2021-09-12 NOTE — Consult Note (Addendum)
Cardiology Consultation:   Patient ID: Ronald Colon MRN: [id number]; DOB: [birth year]  Admit date: 09/10/2021 Date of Consult: 09/12/2021  PCP:  Ronald Sites, MD   [facility] Providers Cardiologist:  [name], MD        Patient Profile:   Ronald Colon is a 88 y.o. 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 Eliseo Squires.  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  Patient saw [name] 09/05/21 with worsening DOE and echo ordered.  Patient admitted 09/10/21 with abdominal pain, N/V felt secondary to gastritis, diverticulitis. AKI Crt 2.33 IVF held b/c xray-congestion. Echo yest EF 20-25% grade 3 DD(restrictive), RVSP 48.39mmHg, mod AS AVA 0.91, mean grad 13 mmHg.  Past Medical History:  Diagnosis Date   Aortic stenosis    Mild February 2018   Arthritis    Atrial fibrillation [facility])    Diagnosed January 2018   AVM (arteriovenous malformation)    Carotid stenosis    Diverticulosis    Essential hypertension    Gout    Hx of adenomatous colonic polyps    Hyperlipidemia    IDA (iron deficiency anemia)    Internal hemorrhoids    Rheumatic fever 1937   Stroke ([place]) 09/10/2016   Tubular adenoma of colon     Past Surgical History:  Procedure Laterality Date   CATARACT EXTRACTION W/PHACO Right 08/02/2014   Procedure: CATARACT EXTRACTION PHACO AND INTRAOCULAR LENS PLACEMENT; CDE:  12.77;  Surgeon: Williams Che, MD;  Location: [facility];  Service: Ophthalmology;  Laterality: Right;   CATARACT  EXTRACTION W/PHACO Left 02/15/2015   Procedure: CATARACT EXTRACTION PHACO AND INTRAOCULAR LENS PLACEMENT (IOC);  Surgeon: Williams Che, MD;  Location: [facility];  Service: Ophthalmology;  Laterality: Left;  CDE 18.00   COLONOSCOPY     COLONOSCOPY WITH PROPOFOL N/A 12/13/2016   ileum normal, polyp in cecum, ascending colon-multiple, transverse colon, diverticulosis in sigmoid descending and transverse, narrowing of colon in assoc with diverticular opening, internal hemorrhoids   ELBOW SURGERY Right    "grissle in there"   ENDARTERECTOMY Left 09/17/2016   Procedure: LEFT CAROTID ENDARTECTOMY;  Surgeon: Waynetta Sandy, MD;  Location: [facility];  Service: Vascular;  Laterality: Left;   ESOPHAGEAL DILATION  09/03/2013   Procedure: ESOPHAGEAL DILATION;  Surgeon: Rogene Houston, MD;  Location: [facility];  Service: Endoscopy;;   ESOPHAGOGASTRODUODENOSCOPY N/A 09/03/2013   Procedure: ESOPHAGOGASTRODUODENOSCOPY (EGD);  Surgeon: Rogene Houston, MD;  Location: [facility];  Service: Endoscopy;  Laterality: N/A;  255-rescheduled to 1030 Ann to notify pt   ESOPHAGOGASTRODUODENOSCOPY (EGD) WITH PROPOFOL N/A 12/13/2016   normal, no specimens   GIVENS CAPSULE STUDY N/A 08/17/2017   AVMs   PATCH ANGIOPLASTY Left 09/17/2016   Procedure: PATCH ANGIOPLASTY WITH Rueben Bash BIOLOGIC PATCH 1 CM X6 CM;  Surgeon: Waynetta Sandy, MD;  Location: [facility] OR;  Service: Vascular;  Laterality: Left;     Home Medications:  Prior to Admission medications   Medication Sig Start Date  End Date Taking? Authorizing Provider  acetaminophen (TYLENOL) 325 MG tablet Take 650 mg by mouth daily as needed for mild pain or headache.    Yes [provider]  allopurinol (ZYLOPRIM) 300 MG tablet Take 300 mg by mouth daily.   Yes [provider]  alum & mag hydroxide-simeth (MAALOX/MYLANTA) 200-200-20 MG/5ML suspension Take by mouth every 6 (six) hours as needed for indigestion or heartburn.   Yes  [provider]  amiodarone (PACERONE) 200 MG tablet Take 0.5 tablets (100 mg total) by mouth daily. 05/07/17  Yes Satira Sark, MD  apixaban (ELIQUIS) 2.5 MG TABS tablet TAKE 1 TABLET BY MOUTH TWICE A DAY 08/15/21  Yes Satira Sark, MD  atorvastatin (LIPITOR) 40 MG tablet Take 1 tablet (40 mg total) by mouth daily at 6 PM. 09/19/16  Yes Trinh, Janalyn Harder, PA-C  colchicine 0.6 MG tablet Take 0.6 mg by mouth 2 (two) times daily as needed (gout).   Yes [provider]  ezetimibe (ZETIA) 10 MG tablet Take 10 mg by mouth daily.   Yes [provider]  FeFum-FePoly-FA-B Cmp-C-Biot (FOLIVANE-PLUS) CAPS TAKE 1 CAPSULE BY MOUTH EVERY DAY IN THE MORNING Patient taking differently: Take 1 capsule by mouth daily. 10/29/18  Yes Pyrtle, Lajuan Lines, MD  fish oil-omega-3 fatty acids 1000 MG capsule Take 1 g by mouth daily.   Yes [provider]  furosemide (LASIX) 20 MG tablet Take 20 mg by mouth daily.   Yes [provider]  metoprolol tartrate (LOPRESSOR) 25 MG tablet Take 0.5 tablets (12.5 mg total) by mouth 2 (two) times daily. 01/08/17 09/10/21 Yes Satira Sark, MD  pantoprazole (PROTONIX) 40 MG tablet Take 1 tablet (40 mg total) by mouth 2 (two) times daily. Patient taking differently: Take by mouth daily. 07/03/21 09/10/21 Yes Carlan, Chelsea L, NP  prochlorperazine (COMPAZINE) 5 MG tablet Take 1 tablet (5 mg total) by mouth every 8 (eight) hours as needed for nausea or vomiting. 08/21/21  Yes Harvel Quale, MD  ondansetron (ZOFRAN) 4 MG tablet Take 1 tablet (4 mg total) by mouth every 8 (eight) hours as needed for nausea or vomiting. Patient not taking: Reported on 09/10/2021 08/16/21   Harvel Quale, MD  sucralfate (CARAFATE) 1 GM/10ML suspension Take 10 mLs (1 g total) by mouth 4 (four) times daily -  with meals and at bedtime. Patient not taking: Reported on 09/10/2021 07/03/21   Gabriel Rung, NP    Inpatient  Medications: Scheduled Meds:  amiodarone  100 mg Oral Daily   apixaban  2.5 mg Oral BID   atorvastatin  40 mg Oral q1800   ezetimibe  10 mg Oral Daily   feeding supplement  237 mL Oral BID BM   metoprolol tartrate  12.5 mg Oral BID   pantoprazole (PROTONIX) IV  40 mg Intravenous Q12H   Continuous Infusions:  piperacillin-tazobactam (ZOSYN)  IV 3.375 g (09/12/21 0513)   PRN Meds: morphine injection, prochlorperazine  Allergies:   No Known Allergies  Social History:   Social History   Socioeconomic History   Marital status: Married    Spouse name: Not on file   Number of children: 1   Years of education: Not on file   Highest education level: Not on file  Occupational History   Occupation: Retired  Tobacco Use   Smoking status: Former    Packs/day: 1.00    Years: 20.00    Pack years: 20.00    Types: Cigarettes  Quit date: 49    Years since quitting: 43.1   Smokeless tobacco: Never  Vaping Use   Vaping Use: Never used  Substance and Sexual Activity   Alcohol use: No   Drug use: No   Sexual activity: Not on file  Other Topics Concern   Not on file  Social History Narrative   Not on file   Social Determinants of Health   Financial Resource Strain: Not on file  Food Insecurity: Not on file  Transportation Needs: Not on file  Physical Activity: Not on file  Stress: Not on file  Social Connections: Not on file  Intimate Partner Violence: Not on file    Family History:     Family History  Problem Relation Age of Onset   CVA Mother 72   Pneumonia Father 32   Colon cancer Neg Hx      ROS:  Please see the history of present illness.  Review of Systems  Constitutional: Negative.  HENT: Negative.    Cardiovascular: Negative.   Respiratory: Negative.    Endocrine: Negative.   Hematologic/Lymphatic: Negative.   Musculoskeletal: Negative.   Gastrointestinal: Negative.   Genitourinary: Negative.   Neurological: Negative.    All other ROS reviewed  and negative.     Physical Exam/Data:   Vitals:   09/11/21 0601 09/11/21 1502 09/11/21 2223 09/12/21 0444  BP: 94/62 (!) 100/57 91/62 118/76  Pulse: 66 66 (!) 58 74  Resp: 19  18 18   Temp: 98 F (36.7 C) 98 F (36.7 C) 97.7 F (36.5 C) 97.9 F (36.6 C)  TempSrc:  Oral    SpO2: 97% 96% 90% 98%  Weight:      Height:        Intake/Output Summary (Last 24 hours) at 09/12/2021 0905 Last data filed at 09/12/2021 0700 Gross per 24 hour  Intake 1308.39 ml  Output 200 ml  Net 1108.39 ml   Last 3 Weights 09/10/2021 09/05/2021 08/16/2021  Weight (lbs) 195 lb 194 lb 12.8 oz (No Data)  Weight (kg) 88.451 kg 88.361 kg (No Data)     Body mass index is 25.04 kg/m.  General:  Well nourished, well developed, in no acute distress  HEENT: normal Neck: increased JVD Vascular: No carotid bruits; Distal pulses 2+ bilaterally Cardiac:  normal S1, O1;HYQMV irreg 2/6 systolic murmur LSB Lungs:  decreased breath sounds with bibasilar crackles Abd: soft, nontender, no hepatomegaly  Ext: no edema Musculoskeletal:  No deformities, BUE and BLE strength normal and equal Skin: warm and dry  Neuro:  CNs 2-12 intact, no focal abnormalities noted Psych:  Normal affect   EKG:  The EKG was personally reviewed and demonstrates:   Afib with LBBB Telemetry:  Telemetry was personally reviewed and demonstrates:  Afib controlled rate  Relevant CV Studies:   Echo 1.30/23 IMPRESSIONS     1. Left ventricular ejection fraction, by estimation, is 20 to 25%. The  left ventricle has severely decreased function. The left ventricle  demonstrates global hypokinesis. The left ventricular internal cavity size  was moderately dilated. There is mild  concentric left ventricular hypertrophy. Left ventricular diastolic  parameters are consistent with Grade III diastolic dysfunction  (restrictive).   2. Right ventricular systolic function is mildly reduced. The right  ventricular size is mildly-to-moderately enlarged.  There is moderately  elevated pulmonary artery systolic pressure. The estimated right  ventricular systolic pressure is 78.4 mmHg.   3. Left atrial size was severely dilated.   4. Right atrial size  was severely dilated.   5. The mitral valve is abnormal. Mild mitral valve regurgitation.  Moderate mitral annular calcification.   6. Tricuspid valve regurgitation is mild to moderate.   7. The aortic valve is tricuspid. There is moderate calcification of the  aortic valve. There is moderate thickening of the aortic valve. Aortic  valve regurgitation is trivial.   8. Aortic dilatation noted. There is mild dilatation of the aortic root,  measuring 38 mm.   9. The inferior vena cava is dilated in size with <50% respiratory  variability, suggesting right atrial pressure of 15 mmHg.   Comparison(s): Compared to prior TTE in 2019, the LVEF is no severely  reduced at 20-25% (previously 45-50%). Aortic Valve: Suspect moderate low flow, low gradient aortic stenosis with  AVA 0.91cm2, mean gradient 62mmHg, Vmax 2.76m/s, DI 0.3, SVI 24. The  aortic valve is tricuspid. There is moderate calcification of the aortic  valve. There is moderate thickening  of the aortic valve. Aortic valve regurgitation is trivial. Aortic valve  mean gradient measures 13.0 mmHg. Aortic valve peak gradient measures 25.2  mmHg. Aortic valve area, by VTI measures 0.91 cm.   Echocardiogram: 08/2017 Study Conclusions   - Left ventricle: The cavity size was normal. Wall thickness was    increased in a pattern of mild LVH. Systolic function was mildly    reduced. The estimated ejection fraction was in the range of 45%    to 50%. Diffuse hypokinesis. Features are consistent with a    pseudonormal left ventricular filling pattern, with concomitant    abnormal relaxation and increased filling pressure (grade 2    diastolic dysfunction). Doppler parameters are consistent with    high ventricular filling pressure.  - Aortic  valve: Moderately calcified annulus. Trileaflet;    moderately thickened leaflets. There was mild to moderate    stenosis. There was mild regurgitation. Valve area (VTI): 1.35    cm^2. Valve area (Vmax): 1.45 cm^2. Valve area (Vmean): 1.15    cm^2.  - Mitral valve: There was mild regurgitation.  - Left atrium: The atrium was moderately dilated.  - Right ventricle: The cavity size was mildly dilated.  - Right atrium: The atrium was mildly dilated.  - Atrial septum: No defect or patent foramen ovale was identified.  - Pulmonary arteries: Systolic pressure was moderately to severely    increased. PA peak pressure: 67 mm Hg (S).     Laboratory Data:  High Sensitivity Troponin:   Recent Labs  Lab 09/10/21 1907 09/10/21 2114  TROPONINIHS 16 16     Chemistry Recent Labs  Lab 09/10/21 1907 09/11/21 0454 09/12/21 0544  NA 137 140 138  K 4.3 4.0 5.2*  CL 108 110 108  CO2 24 24 22   GLUCOSE 117* 105* 98  BUN 45* 45* 41*  CREATININE 2.33* 2.22* 2.15*  CALCIUM 9.0 8.8* 8.9  MG  --  2.5*  --   GFRNONAA 26* 27* 29*  ANIONGAP 5 6 8     Recent Labs  Lab 09/10/21 1907 09/11/21 0454 09/12/21 0544  PROT 6.7 6.0* 5.9*  ALBUMIN 3.4* 3.0* 2.9*  AST 42* 36 34  ALT 28 25 23   ALKPHOS 84 73 67  BILITOT 1.5* 1.3* 1.2   Lipids No results for input(s): CHOL, TRIG, HDL, LABVLDL, LDLCALC, CHOLHDL in the last 168 hours.  Hematology Recent Labs  Lab 09/10/21 1907 09/11/21 0454 09/12/21 0544  WBC 6.2 6.0 6.2  RBC 4.05* 3.69* 3.77*  HGB 13.3 12.3* 12.7*  HCT 41.4 38.3* 39.0  MCV 102.2* 103.8* 103.4*  MCH 32.8 33.3 33.7  MCHC 32.1 32.1 32.6  RDW 16.7* 16.4* 16.5*  PLT 140* 128* 144*   Thyroid No results for input(s): TSH, FREET4 in the last 168 hours.  BNP Recent Labs  Lab 09/12/21 0544  BNP 1,793.0*    DDimer No results for input(s): DDIMER in the last 168 hours.   Radiology/Studies:  CT ABDOMEN PELVIS WO CONTRAST  Result Date: 09/10/2021 CLINICAL DATA:  Central abdominal  pain for 6 months. Nausea and vomiting. EXAM: CT ABDOMEN AND PELVIS WITHOUT CONTRAST TECHNIQUE: Multidetector CT imaging of the abdomen and pelvis was performed following the standard protocol without IV contrast. RADIATION DOSE REDUCTION: This exam was performed according to the departmental dose-optimization program which includes automated exposure control, adjustment of the mA and/or kV according to patient size and/or use of iterative reconstruction technique. COMPARISON:  None. FINDINGS: Lower chest: Small to moderate bilateral pleural effusions are seen with mild compressive atelectasis in the lung bases. Moderate cardiomegaly, without pericardial effusion. Hepatobiliary: No mass visualized on this unenhanced exam. Mild left lobe hypertrophy and capsular nodularity raises suspicion for cirrhosis. Mild ascites in abdomen and pelvis. Gallbladder is unremarkable. No evidence of biliary ductal dilatation. Pancreas: No mass or inflammatory process visualized on this unenhanced exam. Spleen:  Within normal limits in size. Adrenals/Urinary tract: Multiple small less than 1 cm renal calculi are seen bilaterally. No evidence of ureteral calculi or hydronephrosis. Mild diffuse bladder wall thickening may be due to chronic bladder outlet obstruction or cystitis. Stomach/Bowel: Extensive colonic diverticulosis is noted. A focal area of wall thickening is seen involving the mid sigmoid colon with hazy opacity in the adjacent sigmoid mesocolon, consistent with mild diverticulitis. No evidence of bowel perforation or abscess. Vascular/Lymphatic: No pathologically enlarged lymph nodes identified. No evidence of abdominal aortic aneurysm. Aortic atherosclerotic calcification noted. Reproductive:  No mass or other significant abnormality. Other:  None. Musculoskeletal:  No suspicious bone lesions identified. IMPRESSION: Mild sigmoid diverticulitis. No evidence of bowel perforation or abscess. Bilateral nephrolithiasis. No  evidence of ureteral calculi or hydronephrosis. Mild diffuse bladder wall thickening, which may be due to chronic bladder outlet obstruction or cystitis. Mild ascites. Possible hepatic cirrhosis. Recommend correlation with liver function tests and serology. Small to moderate bilateral pleural effusions and bibasilar atelectasis. Electronically Signed   By: Marlaine Hind M.D.   On: 09/10/2021 21:34   DG Chest Portable 1 View  Result Date: 09/10/2021 CLINICAL DATA:  Shortness of breath EXAM: PORTABLE CHEST 1 VIEW COMPARISON:  04/13/2021 FINDINGS: Cardiac shadow is mildly prominent but stable. Aortic calcifications are seen. Lungs are well aerated bilaterally. Mild atelectasis/scarring is noted in the bases bilaterally. Mild central vascular prominence is noted. Changes of prior granulomatous disease are again seen. IMPRESSION: Stable bibasilar atelectasis/scarring. Prior granulomatous disease. Mild vascular congestion. Electronically Signed   By: Inez Catalina M.D.   On: 09/10/2021 19:36   ECHOCARDIOGRAM COMPLETE  Result Date: 09/11/2021    ECHOCARDIOGRAM REPORT   Patient Name:   Ronald Colon Date of Exam: 09/11/2021 Medical Rec #:  [id number]     Height:       74.0 in Accession #:    [id number]    Weight:       195.0 lb Date of Birth:  1928/12/04     BSA:          2.150 m Patient Age:    30 years      BP:  94/62 mmHg Patient Gender: M             HR:           66 bpm. Exam Location:  [facility] Procedure: 2D Echo, Cardiac Doppler, Color Doppler and Intracardiac            Opacification Agent Indications:    Dyspnea  History:        Patient has prior history of Echocardiogram examinations, most                 recent 08/17/2017. Stroke, Arrythmias:Atrial Fibrillation; Risk                 Factors:Hypertension, Dyslipidemia and Former Smoker.  Sonographer:    Wenda Low Referring Phys: [place] Comments: Technically difficult study due to poor echo windows. IMPRESSIONS  1. Left  ventricular ejection fraction, by estimation, is 20 to 25%. The left ventricle has severely decreased function. The left ventricle demonstrates global hypokinesis. The left ventricular internal cavity size was moderately dilated. There is mild concentric left ventricular hypertrophy. Left ventricular diastolic parameters are consistent with Grade III diastolic dysfunction (restrictive).  2. Right ventricular systolic function is mildly reduced. The right ventricular size is mildly-to-moderately enlarged. There is moderately elevated pulmonary artery systolic pressure. The estimated right ventricular systolic pressure is 81.0 mmHg.  3. Left atrial size was severely dilated.  4. Right atrial size was severely dilated.  5. The mitral valve is abnormal. Mild mitral valve regurgitation. Moderate mitral annular calcification.  6. Tricuspid valve regurgitation is mild to moderate.  7. The aortic valve is tricuspid. There is moderate calcification of the aortic valve. There is moderate thickening of the aortic valve. Aortic valve regurgitation is trivial.  8. Aortic dilatation noted. There is mild dilatation of the aortic root, measuring 38 mm.  9. The inferior vena cava is dilated in size with <50% respiratory variability, suggesting right atrial pressure of 15 mmHg. Comparison(s): Compared to prior TTE in 2019, the LVEF is no severely reduced at 20-25% (previously 45-50%). FINDINGS  Left Ventricle: Left ventricular ejection fraction, by estimation, is 20 to 25%. The left ventricle has severely decreased function. The left ventricle demonstrates global hypokinesis. Definity contrast agent was given IV to delineate the left ventricular endocardial borders. The left ventricular internal cavity size was moderately dilated. There is mild concentric left ventricular hypertrophy. Left ventricular diastolic parameters are consistent with Grade III diastolic dysfunction (restrictive). Right Ventricle: The right ventricular size is  mildly-to-moderately enlarged. Right vetricular wall thickness was not well visualized. Right ventricular systolic function is mildly reduced. There is moderately elevated pulmonary artery systolic pressure.  The tricuspid regurgitant velocity is 2.90 m/s, and with an assumed right atrial pressure of 15 mmHg, the estimated right ventricular systolic pressure is 17.5 mmHg. Left Atrium: Left atrial size was severely dilated. Right Atrium: Right atrial size was severely dilated. Pericardium: There is no evidence of pericardial effusion. Mitral Valve: The mitral valve is abnormal. There is mild thickening of the mitral valve leaflet(s). There is mild calcification of the mitral valve leaflet(s). Moderate mitral annular calcification. Mild mitral valve regurgitation. MV peak gradient, 5.2  mmHg. The mean mitral valve gradient is 1.0 mmHg. Tricuspid Valve: The tricuspid valve is normal in structure. Tricuspid valve regurgitation is mild to moderate. Aortic Valve: Suspect moderate low flow, low gradient aortic stenosis with AVA 0.91cm2, mean gradient 9mmHg, Vmax 2.35m/s, DI 0.3, SVI 24. The aortic valve is tricuspid. There is  moderate calcification of the aortic valve. There is moderate thickening of the aortic valve. Aortic valve regurgitation is trivial. Aortic valve mean gradient measures 13.0 mmHg. Aortic valve peak gradient measures 25.2 mmHg. Aortic valve area, by VTI measures 0.91 cm. Pulmonic Valve: The pulmonic valve was normal in structure. Pulmonic valve regurgitation is mild. Aorta: Aortic dilatation noted. There is mild dilatation of the aortic root, measuring 38 mm. Venous: The inferior vena cava is dilated in size with less than 50% respiratory variability, suggesting right atrial pressure of 15 mmHg. IAS/Shunts: The atrial septum is grossly normal.  LEFT VENTRICLE PLAX 2D LVIDd:         6.10 cm   Diastology LVIDs:         5.40 cm   LV e' medial:    3.60 cm/s LV PW:         1.20 cm   LV E/e' medial:  28.1  LV IVS:        1.40 cm   LV e' lateral:   6.70 cm/s LVOT diam:     2.00 cm   LV E/e' lateral: 15.1 LV SV:         50 LV SV Index:   23 LVOT Area:     3.14 cm  RIGHT VENTRICLE RV Basal diam:  4.00 cm RV Mid diam:    3.80 cm RV S prime:     6.13 cm/s TAPSE (M-mode): 1.8 cm LEFT ATRIUM              Index        RIGHT ATRIUM           Index LA diam:        4.50 cm  2.09 cm/m   RA Area:     30.80 cm LA Vol (A2C):   116.0 ml 53.95 ml/m  RA Volume:   110.00 ml 51.16 ml/m LA Vol (A4C):   105.0 ml 48.83 ml/m LA Biplane Vol: 113.0 ml 52.55 ml/m  AORTIC VALVE                     PULMONIC VALVE AV Area (Vmax):    0.94 cm      PV Vmax:       0.54 m/s AV Area (Vmean):   0.89 cm      PV Peak grad:  1.2 mmHg AV Area (VTI):     0.91 cm AV Vmax:           251.00 cm/s AV Vmean:          168.000 cm/s AV VTI:            0.547 m AV Peak Grad:      25.2 mmHg AV Mean Grad:      13.0 mmHg LVOT Vmax:         75.10 cm/s LVOT Vmean:        47.800 cm/s LVOT VTI:          0.158 m LVOT/AV VTI ratio: 0.29  AORTA Ao Root diam: 3.80 cm Ao Asc diam:  3.20 cm MITRAL VALVE                TRICUSPID VALVE MV Area (PHT): 4.89 cm     TR Peak grad:   33.6 mmHg MV Area VTI:   2.20 cm     TR Vmax:        290.00 cm/s MV Peak grad:  5.2 mmHg MV Mean grad:  1.0  mmHg     SHUNTS MV Vmax:       1.14 m/s     Systemic VTI:  0.16 m MV Vmean:      54.2 cm/s    Systemic Diam: 2.00 cm MV Decel Time: 155 msec MV E velocity: 101.00 cm/s MV A velocity: 31.30 cm/s MV E/A ratio:  3.23 Gwyndolyn Kaufman MD Electronically signed by Gwyndolyn Kaufman MD Signature Date/Time: 09/11/2021/4:45:31 PM    Final      Assessment and Plan:   Acute systolic CHF BNP 3151 DOE with worsening LVEF 20-25%(previous 45-50% 2019). On metoprolol. Consider switching to coreg. Can't use ACEI/ARB/Entresto with AKI. Takes lasix 20 mg po at home but on hold here with AKI. May need gentle diuresis and renal to see. Not overtly fluid overloaded.  Abdominal pain secondary to  gastritis/diverticulitis followed by GI with plans for EGD while here  Moderate AS  PAF on Eliquis 2.5 mg bid, amio, metoprolol. Was in NSR 2019 but now in Afib.? Consider stopping amiodarone and rate control with Coreg. Will discuss with MD.  AKI Crt 2.15 today  History of CVA  HTN  HLD on lipitor/zetia   Risk Assessment/Risk Scores:        New York Heart Association (NYHA) Functional Class NYHA Class III  CHA2DS2-VASc Score = 7   This indicates a 11.2% annual risk of stroke. The patient's score is based upon: CHF History: 1 HTN History: 1 Diabetes History: 0 Stroke History: 2 Vascular Disease History: 1 Age Score: 2 Gender Score: 0         For questions or updates, please contact [facility] Please consult [email or web address] for contact info under    Signed, Ermalinda Barrios, PA-C  09/12/2021 9:05 AM  Attending note  Patient seen and discussed with PA Bonnell Public, I agree with her documentation. 88 yo male history of chronic sysotlic HF LVE 76-16%< PAF, carotid stenosis with left CEA, aortic stenosis, prior CVA, history of GI bleeding admitted with abdominal pain with nausea and occasional vomiting.     K 4.3 Cr 2.33 BUN 45 WBC 6.2 Hgb 13.3 Plt 140 Lactic acid 1.4 Trop 16-->16 COVID neg flu neg BNP 1793 (1266 4 years ago) CXR mild congestoin CT A/P: mild diverticulitis, possible cirrhosis,small to moderate bilateral pleural effusions Echo: LVEF 20-25%, global hypokinesis, grade III dd, mild RV dysfunction, severe BAE EKG afib, chronic LBBB   [facility] 195 lbs Jan 24 194 lbs 06/2021 189 lbs   Assessment and plan   1.Acute systolic HF - Jan 0737 echo: LVEF 45-50% - Jan 2023 echo LVEF 20-25%, global hypokinesis, grade III dd, mild RV dysfunction, severe BAE  - in setting of AKI and diverticulitis has received IVFs this admit - BNP 1793, CXR mild congestion. Decreased breath sounds bases on exam, CT A/P showed bilateral pleural effusions. Up 6 lbs since  06/2021 - dose IV lasix 40mg  x 1 today and monitor renal function.  -medical therapy limited by AKI and soft bp's. For now change lopressor to toprol starting with AM dose.  - patient 88 yo but lives independently, was driving up until about 2 weeks ago. We discussed how aggressive he would want to be with testing and interventions, for now he wishes to give some thought. We are limited by his renal function and active diverticulitis as well at this time.    2.Aortic stenosis - echo LVEF 20-25%, mean grad 13, AVA VTI 0.91, DI 0.29, SVI 23 - low flow low gradient moderate to  severe AS  - patient 88 yo but lives independently, was driving up until about 2 weeks ago. We discussed how aggressive he would want to be with testing and interventions, for now he wishes to give some thought. We are limited by his renal function and active diverticulitis as well at this time.    3.Hypoxia --from admit note sats 76% on RA on presentation, improved with [place] - currently apepars volume overloaded, dosing IV lasix 40mg  x 1.   4. AKI - baseline Cr 1.3-1.5, up to 2.33 on admission  - thought secondary to prerenal, has been receiving IVFs - currently appears volume overload. AKI could be more related to venous congestion and CHF. FOllow with IV diuresis today, d/c IVFs.    5. Afib - amio held on admit due to long QTc. Given LBBB QTc would be prolonged, would restart amio.  - 2019 EKG shows SR, afib in clinic Jan 24 and this admit. Unclear overall duration - rate controlled, if he stays in afib this admission likely just d/c amio and rate contorl him.   6. Diverticulitis - management per GI and primary team   Carlyle Dolly MD

## 2021-09-12 NOTE — Progress Notes (Signed)
Pt had c/o epigastric burning after eating. Pt was able to eat a couple of bites of his lunch and again had burning and vomiting. MD made aware. Antacids ordered and administered. Pt stated, "the medicine worked." Will continue to monitor.

## 2021-09-13 ENCOUNTER — Inpatient Hospital Stay (HOSPITAL_COMMUNITY): Payer: Medicare Other | Admitting: Certified Registered"

## 2021-09-13 ENCOUNTER — Encounter (HOSPITAL_COMMUNITY)
Admission: EM | Disposition: A | Discharge status: Home Health | Payer: Self-pay | Source: Home / Self Care | Attending: Student

## 2021-09-13 ENCOUNTER — Encounter (HOSPITAL_COMMUNITY): Payer: Self-pay | Admitting: Internal Medicine

## 2021-09-13 DIAGNOSIS — I5021 Acute systolic (congestive) heart failure: Secondary | ICD-10-CM

## 2021-09-13 DIAGNOSIS — K317 Polyp of stomach and duodenum: Secondary | ICD-10-CM

## 2021-09-13 DIAGNOSIS — K2970 Gastritis, unspecified, without bleeding: Secondary | ICD-10-CM

## 2021-09-13 DIAGNOSIS — I482 Chronic atrial fibrillation, unspecified: Secondary | ICD-10-CM

## 2021-09-13 DIAGNOSIS — I8500 Esophageal varices without bleeding: Secondary | ICD-10-CM

## 2021-09-13 DIAGNOSIS — R9431 Abnormal electrocardiogram [ECG] [EKG]: Secondary | ICD-10-CM

## 2021-09-13 DIAGNOSIS — R112 Nausea with vomiting, unspecified: Secondary | ICD-10-CM

## 2021-09-13 DIAGNOSIS — K5792 Diverticulitis of intestine, part unspecified, without perforation or abscess without bleeding: Secondary | ICD-10-CM | POA: Diagnosis not present

## 2021-09-13 DIAGNOSIS — R1013 Epigastric pain: Secondary | ICD-10-CM

## 2021-09-13 DIAGNOSIS — I5043 Acute on chronic combined systolic (congestive) and diastolic (congestive) heart failure: Secondary | ICD-10-CM

## 2021-09-13 DIAGNOSIS — N179 Acute kidney failure, unspecified: Secondary | ICD-10-CM | POA: Diagnosis not present

## 2021-09-13 DIAGNOSIS — R5381 Other malaise: Secondary | ICD-10-CM

## 2021-09-13 DIAGNOSIS — K311 Adult hypertrophic pyloric stenosis: Secondary | ICD-10-CM

## 2021-09-13 HISTORY — PX: ESOPHAGOGASTRODUODENOSCOPY (EGD) WITH PROPOFOL: SHX5813

## 2021-09-13 HISTORY — PX: POLYPECTOMY: SHX5525

## 2021-09-13 HISTORY — PX: BIOPSY: SHX5522

## 2021-09-13 LAB — BASIC METABOLIC PANEL
Anion gap: 6 (ref 5–15)
BUN: 41 mg/dL — ABNORMAL HIGH (ref 8–23)
CO2: 24 mmol/L (ref 22–32)
Calcium: 8.6 mg/dL — ABNORMAL LOW (ref 8.9–10.3)
Chloride: 106 mmol/L (ref 98–111)
Creatinine, Ser: 2.42 mg/dL — ABNORMAL HIGH (ref 0.61–1.24)
GFR, Estimated: 25 mL/min — ABNORMAL LOW (ref >60)
Glucose, Bld: 105 mg/dL — ABNORMAL HIGH (ref 70–99)
Potassium: 4.2 mmol/L (ref 3.5–5.1)
Sodium: 136 mmol/L (ref 135–145)

## 2021-09-13 LAB — CBC WITH DIFFERENTIAL/PLATELET
Abs Immature Granulocytes: 0.02 10*3/uL (ref 0.00–0.07)
Basophils Absolute: 0.1 10*3/uL (ref 0.0–0.1)
Basophils Relative: 1 %
Eosinophils Absolute: 0.2 10*3/uL (ref 0.0–0.5)
Eosinophils Relative: 4 %
HCT: 40.1 % (ref 39.0–52.0)
Hemoglobin: 13 g/dL (ref 13.0–17.0)
Immature Granulocytes: 0 %
Lymphocytes Relative: 12 %
Lymphs Abs: 0.7 10*3/uL (ref 0.7–4.0)
MCH: 33.8 pg (ref 26.0–34.0)
MCHC: 32.4 g/dL (ref 30.0–36.0)
MCV: 104.2 fL — ABNORMAL HIGH (ref 80.0–100.0)
Monocytes Absolute: 0.6 10*3/uL (ref 0.1–1.0)
Monocytes Relative: 10 %
Neutro Abs: 4.3 10*3/uL (ref 1.7–7.7)
Neutrophils Relative %: 73 %
Platelets: 161 10*3/uL (ref 150–400)
RBC: 3.85 MIL/uL — ABNORMAL LOW (ref 4.22–5.81)
RDW: 16.4 % — ABNORMAL HIGH (ref 11.5–15.5)
WBC: 5.9 10*3/uL (ref 4.0–10.5)
nRBC: 0 % (ref 0.0–0.2)

## 2021-09-13 LAB — CBC
HCT: 39.4 % (ref 39.0–52.0)
Hemoglobin: 12.4 g/dL — ABNORMAL LOW (ref 13.0–17.0)
MCH: 32.3 pg (ref 26.0–34.0)
MCHC: 31.5 g/dL (ref 30.0–36.0)
MCV: 102.6 fL — ABNORMAL HIGH (ref 80.0–100.0)
Platelets: 121 10*3/uL — ABNORMAL LOW (ref 150–400)
RBC: 3.84 MIL/uL — ABNORMAL LOW (ref 4.22–5.81)
RDW: 16.3 % — ABNORMAL HIGH (ref 11.5–15.5)
WBC: 6.1 10*3/uL (ref 4.0–10.5)
nRBC: 0 % (ref 0.0–0.2)

## 2021-09-13 LAB — LACTIC ACID, PLASMA: Lactic Acid, Venous: 1.5 mmol/L (ref 0.5–1.9)

## 2021-09-13 SURGERY — ESOPHAGOGASTRODUODENOSCOPY (EGD) WITH PROPOFOL
Anesthesia: General

## 2021-09-13 MED ORDER — PROPOFOL 500 MG/50ML IV EMUL
INTRAVENOUS | Status: DC | PRN
  Administered 2021-09-13: 150 ug/kg/min via INTRAVENOUS

## 2021-09-13 MED ORDER — SODIUM CHLORIDE 0.9 % IV SOLN
3.0000 g | Freq: Two times a day (BID) | INTRAVENOUS | Status: DC
  Administered 2021-09-13 – 2021-09-15 (×4): 3 g via INTRAVENOUS
  Filled 2021-09-13 (×8): qty 8

## 2021-09-13 MED ORDER — SODIUM CHLORIDE 0.9 % IV SOLN: INTRAVENOUS | Status: DC

## 2021-09-13 MED ORDER — PHENYLEPHRINE 40 MCG/ML (10ML) SYRINGE FOR IV PUSH (FOR BLOOD PRESSURE SUPPORT)
PREFILLED_SYRINGE | INTRAVENOUS | Status: DC | PRN
  Administered 2021-09-13 (×2): 80 ug via INTRAVENOUS

## 2021-09-13 MED ORDER — PROCHLORPERAZINE EDISYLATE 10 MG/2ML IJ SOLN: 10.0000 mg | Freq: Four times a day (QID) | INTRAMUSCULAR | Status: DC | PRN

## 2021-09-13 MED ORDER — PROPOFOL 10 MG/ML IV BOLUS
INTRAVENOUS | Status: DC | PRN
Start: 2021-09-13 — End: 2021-09-13
  Administered 2021-09-13: 30 mg via INTRAVENOUS

## 2021-09-13 MED ORDER — ONDANSETRON HCL 4 MG/2ML IJ SOLN: 4.0000 mg | Freq: Three times a day (TID) | INTRAMUSCULAR | Status: DC | PRN

## 2021-09-13 MED ORDER — LACTATED RINGERS IV SOLN
INTRAVENOUS | Status: DC | PRN
Start: 2021-09-13 — End: 2021-09-13

## 2021-09-13 MED ORDER — LACTATED RINGERS IV SOLN: INTRAVENOUS | Status: DC

## 2021-09-13 MED ORDER — LIDOCAINE 2% (20 MG/ML) 5 ML SYRINGE
INTRAMUSCULAR | Status: DC | PRN
Start: 2021-09-13 — End: 2021-09-13
  Administered 2021-09-13: 50 mg via INTRAVENOUS

## 2021-09-13 NOTE — Progress Notes (Signed)
Patient had emesis x 1 after eating dinner. Vitals signs: P-79, R-18, BP-125/77, O2-100% Room Air. Patient eating a mango pop now. MD [place] made aware.

## 2021-09-13 NOTE — Op Note (Signed)
[facility] Patient Name: Ronald Colon Procedure Date: 09/13/2021 10:10 AM MRN: [id number] Date of Birth: 07-Dec-1928 Attending MD: Hildred Laser , MD CSN: [id number] Age: 88 Admit Type: Inpatient Procedure:                Upper GI endoscopy Indications:              Epigastric abdominal pain, Nausea with vomiting Providers:                Hildred Laser, MD, Caprice Kluver, Kristine L. Risa Grill, Technician Referring MD:             Eulogio Bear, DO Medicines:                Propofol per Anesthesia Complications:            No immediate complications. Estimated Blood Loss:     Estimated blood loss was minimal. Procedure:                Pre-Anesthesia Assessment:                           - Prior to the procedure, a History and Physical                            was performed, and patient medications and                            allergies were reviewed. The patient's tolerance of                            previous anesthesia was also reviewed. The risks                            and benefits of the procedure and the sedation                            options and risks were discussed with the patient.                            All questions were answered, and informed consent                            was obtained. Prior Anticoagulants: The patient                            last took Eliquis (apixaban) 1 day prior to the                            procedure. ASA Grade Assessment: III - A patient                            with severe systemic disease. After reviewing the  risks and benefits, the patient was deemed in                            satisfactory condition to undergo the procedure.                           After obtaining informed consent, the endoscope was                            passed under direct vision. Throughout the                            procedure, the patient's blood pressure, pulse, and                             oxygen saturations were monitored continuously. The                            [id number]-[id number] ([id number]) scope was introduced through the                            mouth, and advanced to the second part of duodenum.                            The upper GI endoscopy was accomplished without                            difficulty. The patient tolerated the procedure                            well. Scope In: 10:38:33 AM Scope Out: 10:54:04 AM Total Procedure Duration: 0 hours 15 minutes 31 seconds  Findings:      The hypopharynx was normal.      The proximal esophagus and mid esophagus were normal.      Grade I varices were found in the distal esophagus.      The Z-line was regular and was found 45 cm from the incisors.      A single small sessile polyp with no stigmata of recent bleeding was       found on the lesser curvature of the gastric antrum. Biopsies were taken       with a cold forceps for histology. The pathology specimen was placed       into Bottle Number 1. For hemostasis, one hemostatic clip was       successfully placed (MR conditional). There was no bleeding at the end       of the procedure.      Localized mild inflammation characterized by congestion (edema) and       erythema was found in the prepyloric region of the stomach. This was       biopsied with a cold forceps for histology. The pathology specimen was       placed into Bottle Number 2.      A mild stenosis was found at the pylorus. This was traversed.      The exam of the stomach was otherwise normal.      The  duodenal bulb and second portion of the duodenum were normal. Impression:               - Normal hypopharynx.                           - Normal proximal esophagus and mid esophagus.                           - Grade I esophageal varices.                           - Z-line regular, 45 cm from the incisors.                           - A single gastric polyp. Biopsied. Clip (MR                             conditional) was placed.                           - Gastritis. Biopsied.                           - Gastric stenosis was found at the pylorus.                           - Normal duodenal bulb and second portion of the                            duodenum. Moderate Sedation:      Per Anesthesia Care Recommendation:           - Patient has a contact number available for                            emergencies. The signs and symptoms of potential                            delayed complications were discussed with the                            patient. Return to normal activities tomorrow.                            Written discharge instructions were provided to the                            patient.                           - Cardiac diet today.                           - Continue present medications.                           - Resume Eliquis (apixaban) at prior  dose tomorrow.                           - Await pathology results.                           - No MRI until clip has passed Procedure Code(s):        --- Professional ---                           [phone], Esophagogastroduodenoscopy, flexible,                            transoral; with biopsy, single or multiple Diagnosis Code(s):        --- Professional ---                           I85.00, Esophageal varices without bleeding                           K31.7, Polyp of stomach and duodenum                           K29.70, Gastritis, unspecified, without bleeding                           K31.1, Adult hypertrophic pyloric stenosis                           R10.13, Epigastric pain                           R11.2, Nausea with vomiting, unspecified CPT copyright 2019 American Medical Association. All rights reserved. The codes documented in this report are preliminary and upon coder review may  be revised to meet current compliance requirements. Hildred Laser, MD Hildred Laser, MD 09/13/2021 11:11:11 AM This report has  been signed electronically. Number of Addenda: 0

## 2021-09-13 NOTE — Anesthesia Postprocedure Evaluation (Signed)
Anesthesia Post Note  Patient: Ronald Colon  Procedure(s) Performed: ESOPHAGOGASTRODUODENOSCOPY (EGD) WITH PROPOFOL POLYPECTOMY BIOPSY  Patient location during evaluation: PACU Anesthesia Type: General Level of consciousness: awake and alert and oriented Pain management: pain level controlled Vital Signs Assessment: post-procedure vital signs reviewed and stable Respiratory status: spontaneous breathing, nonlabored ventilation and respiratory function stable Cardiovascular status: blood pressure returned to baseline and stable Postop Assessment: no apparent nausea or vomiting Anesthetic complications: no   No notable events documented.   Last Vitals:  Vitals:   09/13/21 1126 09/13/21 1140  BP: 111/76 122/76  Pulse: 60 66  Resp: 18 18  Temp:  36.5 C  SpO2: 100% 100%    Last Pain:  Vitals:   09/13/21 1140  TempSrc: Oral  PainSc:                  Vivaan Helseth C Ashelyn Mccravy

## 2021-09-13 NOTE — TOC Initial Note (Signed)
Transition of Care [facility]) - Initial/Assessment Note    Patient Details  Name: Ronald Colon MRN: [id number] Date of Birth: 02/07/1929  [facility]) CM/SW Contact:    Boneta Lucks, RN Phone Number: 09/13/2021, 4:22 PM  Clinical Narrative:      PT recommended HHPT, Patient agreeable, no preferences. Georgina Snell with [facility] accepted, MD updated to order.            Expected Discharge Plan: [place] Barriers to Discharge: Continued Medical Work up   Patient Goals and CMS Choice Patient states their goals for this hospitalization and ongoing recovery are:: to go home CMS Medicare.gov Compare Post Acute Care list provided to:: Patient Choice offered to / list presented to : Patient  Expected Discharge Plan and Services Expected Discharge Plan: [place]                    DME Agency: [facility] Date DME Agency Contacted: 09/13/21 Time DME Agency Contacted: [id number] Representative spoke with at DME Agency: Georgina Snell [place]: PT       Activities of Daily Living Home Assistive Devices/Equipment: [place] (specify quad or straight), Walker (specify type) ADL Screening (condition at time of admission) Patient's cognitive ability adequate to safely complete daily activities?: Yes Is the patient deaf or have difficulty hearing?: Yes Does the patient have difficulty seeing, even when wearing glasses/contacts?: No Does the patient have difficulty concentrating, remembering, or making decisions?: No Patient able to express need for assistance with ADLs?: Yes Does the patient have difficulty dressing or bathing?: No Independently performs ADLs?: Yes (appropriate for developmental age) Does the patient have difficulty walking or climbing stairs?: Yes Weakness of Legs: Both Weakness of Arms/Hands: None  Permission Sought/Granted        Emotional Assessment   Admission diagnosis:  Diverticulitis [K57.92] Acute kidney injury ([place])  [N17.9] Acute diverticulitis [K57.92] Patient Active Problem List   Diagnosis Date Noted   Abnormal CT of liver    Abdominal pain 09/11/2021   Thrombocytopenia ([place]) 09/11/2021   Hypoalbuminemia 09/11/2021   AKI (acute kidney injury) ([place]) 09/11/2021   Respiratory failure with hypoxia ([place]) 09/11/2021   Elevated MCV 09/11/2021   Prolonged QT interval 09/11/2021   Diverticulitis 09/10/2021   Abdominal pain, chronic, epigastric 08/16/2021   Nausea with vomiting 08/16/2021   Dyspnea on exertion 08/16/2017   Iron deficiency anemia    AVM (arteriovenous malformation) of colon    Benign neoplasm of cecum    Benign neoplasm of ascending colon    Benign neoplasm of transverse colon    Benign neoplasm of descending colon    Right arm numbness 10/02/2016   Cerebral infarction due to occlusion or stenosis of precerebral artery (HCC)    Hyperlipidemia, unspecified    Atrial fibrillation, chronic (HCC)    Anticoagulation adequate    Elevated troponin    Carotid stenosis 09/12/2016   Cerebrovascular accident (CVA) due to stenosis of left middle cerebral artery ([place]) 09/10/2016   Hyperglycemia 09/10/2016   CKD (chronic kidney disease), stage III ([place]) 09/10/2016   GERD (gastroesophageal reflux disease) 08/24/2013   Heart murmur 08/18/2013   Irregular heartbeat 08/18/2013   Essential hypertension, benign 08/18/2013   PCP:  [name], MD Pharmacy:   [id number] - [place], [address] AT [address] [place] [address] Phone: [phone] Fax: [phone]

## 2021-09-13 NOTE — Progress Notes (Signed)
PROGRESS NOTE  Ronald Colon UJW:[id number] DOB: 27-Nov-1928   PCP: Ronald Sites, MD  Patient is from: Home.   DOA: 09/10/2021 LOS: 3  Chief complaints:  Chief Complaint  Patient presents with   Abdominal Pain     Brief Narrative / Interim history: 88 y.o. 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 [name] (GI).  Patient reports worsening of the abdominal pain which was described as burning sensation with reflux into the midsternal chest area, pain was of a moderate intensity, it worsens after eating and was associated with nausea and occasional vomiting.  Echo done as planned for outpatient and EF found to be 20%.  Cardiology and GI following.  EGD with grade 1 esophageal varices, gastric polyp (biopsied), gastritis (biopsied) and pyloric stenosis which was dilated.  Subjective: Seen and examined earlier this morning.  No major events overnight of this morning.  No complaints other than "a little" epigastric pain.  He denies melena or hematochezia.  Denies shortness of breath, chest pain.  Patient's son at bedside.  Objective: Vitals:   09/13/21 1115 09/13/21 1126 09/13/21 1140 09/13/21 1520  BP: 109/70 111/76 122/76 110/66  Pulse: (!) 59 60 66 74  Resp: 12 18 18 18   Temp:   97.7 F (36.5 C) 97.7 F (36.5 C)  TempSrc:   Oral Oral  SpO2: 100% 100% 100% 100%  Weight:      Height:        Examination:  GENERAL: No apparent distress.  Nontoxic. HEENT: MMM.  Vision and hearing grossly intact.  NECK: Supple.  Notable JVD. RESP:  No IWOB.  Fair aeration bilaterally. CVS:  RRR. Heart sounds normal.  ABD/GI/GU: BS+. Abd soft, NTND.  MSK/EXT:  Moves extremities. No apparent deformity. No edema.  SKIN: no apparent skin lesion or wound NEURO: Awake, alert and oriented appropriately.   No apparent focal neuro deficit. PSYCH: Calm. Normal affect.   Procedures:  2/1-EGD with grade 1 esophageal varices, gastric polyp (biopsied), gastritis (biopsied) and pyloric stenosis which was dilated.  Microbiology summarized: COVID-19 and influenza PCR nonreactive.  Assessment and plan Acute combined CHF: TTE with LVEF of 20 to 25%, global hypokinesis, G3 DD, severe LAE and RAE and RVSP of 49.  Currently without respiratory distress but prominent JVD.  BNP 1800.  Received a dose of Lasix yesterday.  Intake and output was not well captured.  -Cardiology following. -Hold diuretics due to AKI -Monitor intake and output closely -Sodium and fluid restrictions.   Epigastric abdominal pain likely due to gastritis-EGD as above. Recent Labs    08/16/21 1544 09/10/21 1907 09/11/21 0454 09/12/21 0544 09/13/21 0548 09/13/21 1200  HGB 12.4* 13.3 12.3* 12.7* 12.4* 13.0  -Appreciate telemetry by gastroenterology. - Cardiac diet today. - Continue present medications. - Resume Eliquis (apixaban) at prior dose tomorrow. - Await pathology results. - No MRI until clip has passed  Sigmoid diverticulitis?  Abdominal exam benign. -Change IV Zosyn to IV Unasyn  AKI/azotemia: Cr 1.4 about 4 years ago. Recent Labs    09/10/21 1907 09/11/21 0454 09/12/21 0544 09/13/21 0548  BUN 45* 45* 41* 41*  CREATININE 2.33* 2.22* 2.15* 2.42*  -Continue holding diuretics -Change Zosyn to Unasyn  Chronic atrial fibrillation: Rate controlled.  CHA2DS2-VASc score> 3 -Continue home Lopressor. -Resume Eliquis on 09/14/2021   Hyperkalemia: Likely due  to AKI.  Resolved. -Recheck in the morning  Prolonged QTc  -Avoid or minimize QT prolonging drugs. -Optimize electrolytes   Hyperlipidemia -Continue Lipitor and Zetia  Essential hypertension: Normotensive. -Continue metoprolol  Thrombocytopenia: Reactive.  Resolved.  Physical deconditioning -PT/OT eval  Body mass index is 25.04 kg/m.          DVT prophylaxis:  SCDs Start: 09/11/21 0035  Code Status: Full code Family Communication: Updated patient's son at the bedside. Level of care: Telemetry Status is: Inpatient Remains inpatient appropriate because: Acute combined CHF, acute diverticulitis     Final disposition: TBD.      Consultants:  Cardiology Gastroenterology   Sch Meds:  Scheduled Meds:  amiodarone  100 mg Oral Daily   atorvastatin  40 mg Oral q1800   ezetimibe  10 mg Oral Daily   feeding supplement  237 mL Oral BID BM   metoprolol succinate  12.5 mg Oral Daily   pantoprazole (PROTONIX) IV  40 mg Intravenous Q12H   polyethylene glycol  17 g Oral Daily   sucralfate  1 g Oral TID WC & HS   Continuous Infusions:  ampicillin-sulbactam (UNASYN) IV 200 mL/hr at 09/13/21 1502   PRN Meds:.alum & mag hydroxide-simeth, morphine injection, prochlorperazine  Antimicrobials: Anti-infectives (From admission, onward)    Start     Dose/Rate Route Frequency Ordered Stop   09/13/21 1400  Ampicillin-Sulbactam (UNASYN) 3 g in sodium chloride 0.9 % 100 mL IVPB        3 g 200 mL/hr over 30 Minutes Intravenous Every 12 hours 09/13/21 1037     09/10/21 2230  piperacillin-tazobactam (ZOSYN) IVPB 3.375 g  Status:  Discontinued        3.375 g 12.5 mL/hr over 240 Minutes Intravenous Every 8 hours 09/10/21 2227 09/13/21 0810        I have personally reviewed the following labs and images: CBC: Recent Labs  Lab 09/10/21 1907 09/11/21 0454 09/12/21 0544 09/13/21 0548 09/13/21 1200  WBC 6.2 6.0 6.2 6.1 5.9  NEUTROABS 4.6  --   --   --  4.3  HGB 13.3 12.3* 12.7* 12.4* 13.0  HCT 41.4 38.3* 39.0 39.4 40.1  MCV 102.2* 103.8* 103.4* 102.6* 104.2*  PLT 140* 128* 144* 121* 161   BMP &GFR Recent Labs  Lab 09/10/21 1907 09/11/21 0454 09/12/21 0544 09/13/21 0548  NA 137 140 138 136  K 4.3 4.0 5.2* 4.2  CL 108 110 108 106  CO2 24 24 22 24   GLUCOSE 117* 105* 98 105*  BUN 45* 45* 41* 41*  CREATININE 2.33*  2.22* 2.15* 2.42*  CALCIUM 9.0 8.8* 8.9 8.6*  MG  --  2.5*  --   --   PHOS  --  4.1  --   --    Estimated Creatinine Clearance: 23.6 mL/min (A) (by C-G formula based on SCr of 2.42 mg/dL (H)). Liver & Pancreas: Recent Labs  Lab 09/10/21 1907 09/11/21 0454 09/12/21 0544  AST 42* 36 34  ALT 28 25 23   ALKPHOS 84 73 67  BILITOT 1.5* 1.3* 1.2  PROT 6.7 6.0* 5.9*  ALBUMIN 3.4* 3.0* 2.9*   Recent Labs  Lab 09/10/21 1907  LIPASE 49   No results for input(s): AMMONIA in the last 168 hours. Diabetic: No results for input(s): HGBA1C in the last 72 hours. No results for input(s): GLUCAP in the last 168 hours. Cardiac Enzymes: No results for input(s): CKTOTAL, CKMB, CKMBINDEX, TROPONINI in the last 168 hours. No results for input(s): PROBNP  in the last 8760 hours. Coagulation Profile: No results for input(s): INR, PROTIME in the last 168 hours. Thyroid Function Tests: No results for input(s): TSH, T4TOTAL, FREET4, T3FREE, THYROIDAB in the last 72 hours. Lipid Profile: No results for input(s): CHOL, HDL, LDLCALC, TRIG, CHOLHDL, LDLDIRECT in the last 72 hours. Anemia Panel: Recent Labs    09/11/21 0454 09/11/21 1900  VITAMINB12 1,056* 1,055*  FOLATE 38.1 40.8   Urine analysis:    Component Value Date/Time   COLORURINE YELLOW 09/10/2021 2103   APPEARANCEUR CLEAR 09/10/2021 2103   LABSPEC 1.020 09/10/2021 2103   PHURINE 5.5 09/10/2021 2103   GLUCOSEU NEGATIVE 09/10/2021 2103   HGBUR NEGATIVE 09/10/2021 2103   BILIRUBINUR NEGATIVE 09/10/2021 2103   KETONESUR NEGATIVE 09/10/2021 2103   PROTEINUR NEGATIVE 09/10/2021 2103   NITRITE NEGATIVE 09/10/2021 2103   LEUKOCYTESUR NEGATIVE 09/10/2021 2103   Sepsis Labs: Invalid input(s): PROCALCITONIN, [place]  Microbiology: Recent Results (from the past 240 hour(s))  Resp Panel by RT-PCR (Flu A&B, Covid) Nasopharyngeal Swab     Status: None   Collection Time: 09/10/21 11:09 PM   Specimen: Nasopharyngeal Swab;  Nasopharyngeal(NP) swabs in vial transport medium  Result Value Ref Range Status   SARS Coronavirus 2 by RT PCR NEGATIVE NEGATIVE Final    Comment: (NOTE) SARS-CoV-2 target nucleic acids are NOT DETECTED.  The SARS-CoV-2 RNA is generally detectable in upper respiratory specimens during the acute phase of infection. The lowest concentration of SARS-CoV-2 viral copies this assay can detect is 138 copies/mL. A negative result does not preclude SARS-Cov-2 infection and should not be used as the sole basis for treatment or other patient management decisions. A negative result may occur with  improper specimen collection/handling, submission of specimen other than nasopharyngeal swab, presence of viral mutation(s) within the areas targeted by this assay, and inadequate number of viral copies(<138 copies/mL). A negative result must be combined with clinical observations, patient history, and epidemiological information. The expected result is Negative.  Fact Sheet for Patients:  EntrepreneurPulse.com.au  Fact Sheet for Healthcare Providers:  IncredibleEmployment.be  This test is no t yet approved or cleared by the [place] FDA and  has been authorized for detection and/or diagnosis of SARS-CoV-2 by FDA under an Emergency Use Authorization (EUA). This EUA will remain  in effect (meaning this test can be used) for the duration of the COVID-19 declaration under Section 564(b)(1) of the Act, 21 U.S.C.section 360bbb-3(b)(1), unless the authorization is terminated  or revoked sooner.       Influenza A by PCR NEGATIVE NEGATIVE Final   Influenza B by PCR NEGATIVE NEGATIVE Final    Comment: (NOTE) The Xpert Xpress SARS-CoV-2/FLU/RSV plus assay is intended as an aid in the diagnosis of influenza from Nasopharyngeal swab specimens and should not be used as a sole basis for treatment. Nasal washings and aspirates are unacceptable for Xpert Xpress  SARS-CoV-2/FLU/RSV testing.  Fact Sheet for Patients: EntrepreneurPulse.com.au  Fact Sheet for Healthcare Providers: IncredibleEmployment.be  This test is not yet approved or cleared by the [place] FDA and has been authorized for detection and/or diagnosis of SARS-CoV-2 by FDA under an Emergency Use Authorization (EUA). This EUA will remain in effect (meaning this test can be used) for the duration of the COVID-19 declaration under Section 564(b)(1) of the Act, 21 U.S.C. section 360bbb-3(b)(1), unless the authorization is terminated or revoked.  Performed at [facility], [address], [place], [place] [address]     Radiology Studies: No results found.    Bretta Bang  Bettina Gavia [facility]  If 7PM-7AM, please contact night-coverage [email or web address] 09/13/2021, 4:10 PM

## 2021-09-13 NOTE — Plan of Care (Signed)
°  Problem: Acute Rehab PT Goals(only PT should resolve) Goal: Pt Will Go Supine/Side To Sit Outcome: Progressing Flowsheets (Taken 09/13/2021 1617) Pt will go Supine/Side to Sit: with modified independence Goal: Patient Will Transfer Sit To/From Stand Outcome: Progressing Flowsheets (Taken 09/13/2021 1617) Patient will transfer sit to/from stand:  with supervision  with modified independence Goal: Pt Will Transfer Bed To Chair/Chair To Bed Outcome: Progressing Flowsheets (Taken 09/13/2021 1617) Pt will Transfer Bed to Chair/Chair to Bed:  with supervision  with modified independence Goal: Pt Will Ambulate Outcome: Progressing Flowsheets (Taken 09/13/2021 1617) Pt will Ambulate:  75 feet  with supervision  with rolling walker   4:18 PM, 09/13/21 Lonell Grandchild, MPT Physical Therapist with [facility] [phone] office [phone] mobile phone

## 2021-09-13 NOTE — Transfer of Care (Signed)
Immediate Anesthesia Transfer of Care Note  Patient: Ronald Colon  Procedure(s) Performed: ESOPHAGOGASTRODUODENOSCOPY (EGD) WITH PROPOFOL POLYPECTOMY BIOPSY  Patient Location: PACU  Anesthesia Type:MAC  Level of Consciousness: sedated, patient cooperative and responds to stimulation  Airway & Oxygen Therapy: Patient Spontanous Breathing and Patient connected to nasal cannula oxygen  Post-op Assessment: Report given to RN and Post -op Vital signs reviewed and stable  Post vital signs: Reviewed and stable  Last Vitals:  Vitals Value Taken Time  BP 103/72 09/13/21 1058  Temp    Pulse 58 09/13/21 1059  Resp 14 09/13/21 1100  SpO2 90 % 09/13/21 1059  Vitals shown include unvalidated device data.  Last Pain:  Vitals:   09/13/21 1034  TempSrc:   PainSc: 0-No pain      Patients Stated Pain Goal: 3 (06/77/03 4035)  Complications: No notable events documented.

## 2021-09-13 NOTE — Progress Notes (Signed)
Subjective:  Patient continues to complain of midepigastric pain.  He says this mild pain this morning.  He is hungry.  He has not had any more episodes of nausea or vomiting since yesterday afternoon.  He denies chest pain or shortness of breath.  Current Medications:  Current Facility-Administered Medications:    0.9 %  sodium chloride infusion, , Intravenous, Continuous, Kaydenn Mclear, Mechele Dawley, MD   [MAR Hold] alum & mag hydroxide-simeth (MAALOX/MYLANTA) 200-200-20 MG/5ML suspension 30 mL, 30 mL, Oral, Q6H PRN, Arnoldo Lenis, MD, 30 mL at 09/12/21 1305   [MAR Hold] amiodarone (PACERONE) tablet 100 mg, 100 mg, Oral, Daily, Vann, Jessica U, DO, 100 mg at 09/13/21 0839   [MAR Hold] atorvastatin (LIPITOR) tablet 40 mg, 40 mg, Oral, q1800, Adefeso, Oladapo, DO, 40 mg at 09/12/21 1731   [MAR Hold] ezetimibe (ZETIA) tablet 10 mg, 10 mg, Oral, Daily, Adefeso, Oladapo, DO, 10 mg at 09/13/21 0838   [MAR Hold] feeding supplement (ENSURE ENLIVE / ENSURE PLUS) liquid 237 mL, 237 mL, Oral, BID BM, Adefeso, Oladapo, DO, 237 mL at 09/12/21 1408   [MAR Hold] metoprolol succinate (TOPROL-XL) 24 hr tablet 12.5 mg, 12.5 mg, Oral, Daily, Branch, Alphonse Guild, MD, 12.5 mg at 09/13/21 0839   [facility] Hold] morphine 2 MG/ML injection 2 mg, 2 mg, Intravenous, Q4H PRN, Adefeso, Oladapo, DO, 2 mg at 09/11/21 0129   [MAR Hold] pantoprazole (PROTONIX) injection 40 mg, 40 mg, Intravenous, Q12H, Adefeso, Oladapo, DO, 40 mg at 09/13/21 0837   [MAR Hold] polyethylene glycol (MIRALAX / GLYCOLAX) packet 17 g, 17 g, Oral, Daily, Harper, Kristen S, PA-C, 17 g at 09/12/21 1614   [MAR Hold] prochlorperazine (COMPAZINE) injection 10 mg, 10 mg, Intravenous, Q6H PRN, Adefeso, Oladapo, DO   [MAR Hold] sucralfate (CARAFATE) 1 GM/10ML suspension 1 g, 1 g, Oral, TID WC & HS, Harper, Kristen S, PA-C, 1 g at 09/12/21 2223   Objective: Blood pressure 114/72, pulse 70, temperature 98.9 F (37.2 C), temperature source Oral, resp. rate 15, height  6' 2"  (1.88 m), weight 88.5 kg, SpO2 98 %. Patient is alert and in no acute distress. Oropharyngeal mucosa is normal. No neck masses thyromegaly or lymphadenopathy noted. Cardiac exam with irregular rhythm normal S1 and S2.  No murmur or gallop noted. Auscultation of lungs reveals vesicular breath sounds bilaterally. He does not have peripheral edema.  Labs/studies Results:   CBC Latest Ref Rng & Units 09/13/2021 09/12/2021 09/11/2021  WBC 4.0 - 10.5 K/uL 6.1 6.2 6.0  Hemoglobin 13.0 - 17.0 g/dL 12.4(L) 12.7(L) 12.3(L)  Hematocrit 39.0 - 52.0 % 39.4 39.0 38.3(L)  Platelets 150 - 400 K/uL 121(L) 144(L) 128(L)    CMP Latest Ref Rng & Units 09/13/2021 09/12/2021 09/11/2021  Glucose 70 - 99 mg/dL 105(H) 98 105(H)  BUN 8 - 23 mg/dL 41(H) 41(H) 45(H)  Creatinine 0.61 - 1.24 mg/dL 2.42(H) 2.15(H) 2.22(H)  Sodium 135 - 145 mmol/L 136 138 140  Potassium 3.5 - 5.1 mmol/L 4.2 5.2(H) 4.0  Chloride 98 - 111 mmol/L 106 108 110  CO2 22 - 32 mmol/L 24 22 24   Calcium 8.9 - 10.3 mg/dL 8.6(L) 8.9 8.8(L)  Total Protein 6.5 - 8.1 g/dL - 5.9(L) 6.0(L)  Total Bilirubin 0.3 - 1.2 mg/dL - 1.2 1.3(H)  Alkaline Phos 38 - 126 U/L - 67 73  AST 15 - 41 U/L - 34 36  ALT 0 - 44 U/L - 23 25    Hepatic Function Latest Ref Rng & Units 09/12/2021 09/11/2021 09/10/2021  Total Protein 6.5 - 8.1 g/dL 5.9(L) 6.0(L) 6.7  Albumin 3.5 - 5.0 g/dL 2.9(L) 3.0(L) 3.4(L)  AST 15 - 41 U/L 34 36 42(H)  ALT 0 - 44 U/L 23 25 28   Alk Phosphatase 38 - 126 U/L 67 73 84  Total Bilirubin 0.3 - 1.2 mg/dL 1.2 1.3(H) 1.5(H)  Bilirubin, Direct 0.1 - 0.5 mg/dL - - -      Assessment:  Epigastric pain associate with nausea and vomiting and an elderly gentleman with multiple comorbidities including chronic kidney disease and cardiomyopathy.  He was admitted but was felt to be diverticulitis but his symptoms are primarily pertaining to upper GI tract.  Remains to be seen if he has peptic ulcer disease. Anticoagulant is on hold. Last Eliquis dose  was on 09/11/2021.  Plan:  Proceed with diagnostic esophagogastroduodenoscopy under monitored anesthesia care. I did try to reach out to his son Quita Skye but he did not answer his phone.

## 2021-09-13 NOTE — Anesthesia Preprocedure Evaluation (Signed)
Anesthesia Evaluation  Patient identified by MRN, date of birth, ID band Patient awake    Reviewed: Allergy & Precautions, NPO status , Patient's Chart, lab work & pertinent test results, reviewed documented beta blocker date and time   History of Anesthesia Complications Negative for: history of anesthetic complications  Airway Mallampati: II  TM Distance: >3 FB Neck ROM: Full    Dental  (+) Dental Advisory Given, Missing   Pulmonary former smoker,    Pulmonary exam normal breath sounds clear to auscultation       Cardiovascular hypertension, Pt. on medications and Pt. on home beta blockers + Peripheral Vascular Disease and +CHF  + dysrhythmias Atrial Fibrillation + Valvular Problems/Murmurs AS  Rhythm:Irregular Rate:Normal + Systolic murmurs 1. Left ventricular ejection fraction, by estimation, is 20 to 25%. The left ventricle has severely decreased function. The left ventricle demonstrates global hypokinesis. The left ventricular internal cavity size  was moderately dilated. There is mild  concentric left ventricular hypertrophy. Left ventricular diastolic parameters are consistent with Grade III diastolic dysfunction  (restrictive).  2. Right ventricular systolic function is mildly reduced. The right ventricular size is mildly-to-moderately enlarged. There is moderately  elevated pulmonary artery systolic pressure. The estimated right  ventricular systolic pressure is 08.8 mmHg.  3. Left atrial size was severely dilated.  4. Right atrial size was severely dilated.  5. The mitral valve is abnormal. Mild mitral valve regurgitation.  Moderate mitral annular calcification.  6. Tricuspid valve regurgitation is mild to moderate.  7. The aortic valve is tricuspid. There is moderate calcification of the aortic valve. There is moderate thickening of the aortic valve. Aortic valve regurgitation is trivial.  8. Aortic dilatation  noted. There is mild dilatation of the aortic root, measuring 38 mm.  9. The inferior vena cava is dilated in size with <50% respiratory variability, suggesting right atrial pressure of 15 mmHg.    Neuro/Psych CVA    GI/Hepatic GERD  Medicated and Controlled,  Endo/Other    Renal/GU Renal InsufficiencyRenal disease     Musculoskeletal  (+) Arthritis , Osteoarthritis,    Abdominal   Peds  Hematology  (+) Blood dyscrasia, anemia ,   Anesthesia Other Findings   Reproductive/Obstetrics                             Anesthesia Physical Anesthesia Plan  ASA: 4  Anesthesia Plan: General   Post-op Pain Management: Minimal or no pain anticipated   Induction: Intravenous  PONV Risk Score and Plan: TIVA  Airway Management Planned: Nasal Cannula and Natural Airway  Additional Equipment:   Intra-op Plan:   Post-operative Plan:   Informed Consent:     Dental advisory given  Plan Discussed with: CRNA and Surgeon  Anesthesia Plan Comments:         Anesthesia Quick Evaluation

## 2021-09-13 NOTE — Progress Notes (Signed)
Pharmacy Antibiotic Note  Ronald Colon is a 88 y.o. male admitted on 09/10/2021 with  intra-abdominal .  Pharmacy has been consulted for Unasyn dosing.  Plan: Unasyn 3000 mg IV every 12 hours. Monitor labs, c/s, and patient improvement.  Height: 6\' 2"  (188 cm) Weight: 88.5 kg (195 lb) IBW/kg (Calculated) : 82.2  Temp (24hrs), Avg:98.1 F (36.7 C), Min:97.6 F (36.4 C), Max:98.9 F (37.2 C)  Recent Labs  Lab 09/10/21 1907 09/10/21 1959 09/10/21 2114 09/11/21 0454 09/12/21 0544 09/13/21 0548  WBC 6.2  --   --  6.0 6.2 6.1  CREATININE 2.33*  --   --  2.22* 2.15* 2.42*  LATICACIDVEN  --  1.4 1.7  --   --   --     Estimated Creatinine Clearance: 23.6 mL/min (A) (by C-G formula based on SCr of 2.42 mg/dL (H)).    No Known Allergies  Antimicrobials this admission: Unasyn 2/1 >> Zosyn 1/29 >> 2/1  Microbiology results: 1/29 COVID: negative  Thank you for allowing pharmacy to be a part of this patients care.  Ronald Colon 09/13/2021 10:38 AM

## 2021-09-13 NOTE — Progress Notes (Signed)
Progress Note  Patient Name: Ronald Colon Date of Encounter: 09/13/2021  [facility] Cardiologist: [name], MD   Subjective   No complaints thsi AM  Inpatient Medications    Scheduled Meds:  amiodarone  100 mg Oral Daily   atorvastatin  40 mg Oral q1800   ezetimibe  10 mg Oral Daily   feeding supplement  237 mL Oral BID BM   metoprolol succinate  12.5 mg Oral Daily   pantoprazole (PROTONIX) IV  40 mg Intravenous Q12H   polyethylene glycol  17 g Oral Daily   sucralfate  1 g Oral TID WC & HS   Continuous Infusions:  PRN Meds: alum & mag hydroxide-simeth, morphine injection, prochlorperazine   Vital Signs    Vitals:   09/12/21 1422 09/12/21 2034 09/13/21 0529 09/13/21 0839  BP: 108/66 115/65 (!) 101/54 122/68  Pulse: 66 68 70 67  Resp:  19 19   Temp: 97.6 F (36.4 C) 98 F (36.7 C) 98 F (36.7 C)   TempSrc: Oral     SpO2: 100% 98% 96%   Weight:      Height:        Intake/Output Summary (Last 24 hours) at 09/13/2021 0935 Last data filed at 09/12/2021 1811 Gross per 24 hour  Intake 240 ml  Output --  Net 240 ml   Last 3 Weights 09/10/2021 09/05/2021 08/16/2021  Weight (lbs) 195 lb 194 lb 12.8 oz (No Data)  Weight (kg) 88.451 kg 88.361 kg (No Data)      Telemetry    Rate controlled afib - Personally Reviewed  ECG    N/a - Personally Reviewed  Physical Exam   GEN: No acute distress.   Neck: + JVD Cardiac: irreg, 3/6 systolic murmur rusb Respiratory: Clear to auscultation bilaterally. GI: Soft, nontender, non-distended  MS: No edema; No deformity. Neuro:  Nonfocal  Psych: Normal affect   Labs    High Sensitivity Troponin:   Recent Labs  Lab 09/10/21 1907 09/10/21 2114  TROPONINIHS 16 16     Chemistry Recent Labs  Lab 09/10/21 1907 09/11/21 0454 09/12/21 0544 09/13/21 0548  NA 137 140 138 136  K 4.3 4.0 5.2* 4.2  CL 108 110 108 106  CO2 24 24 22 24   GLUCOSE 117* 105* 98 105*  BUN 45* 45* 41* 41*  CREATININE 2.33* 2.22*  2.15* 2.42*  CALCIUM 9.0 8.8* 8.9 8.6*  MG  --  2.5*  --   --   PROT 6.7 6.0* 5.9*  --   ALBUMIN 3.4* 3.0* 2.9*  --   AST 42* 36 34  --   ALT 28 25 23   --   ALKPHOS 84 73 67  --   BILITOT 1.5* 1.3* 1.2  --   GFRNONAA 26* 27* 29* 25*  ANIONGAP 5 6 8 6     Lipids No results for input(s): CHOL, TRIG, HDL, LABVLDL, LDLCALC, CHOLHDL in the last 168 hours.  Hematology Recent Labs  Lab 09/11/21 0454 09/12/21 0544 09/13/21 0548  WBC 6.0 6.2 6.1  RBC 3.69* 3.77* 3.84*  HGB 12.3* 12.7* 12.4*  HCT 38.3* 39.0 39.4  MCV 103.8* 103.4* 102.6*  MCH 33.3 33.7 32.3  MCHC 32.1 32.6 31.5  RDW 16.4* 16.5* 16.3*  PLT 128* 144* 121*   Thyroid No results for input(s): TSH, FREET4 in the last 168 hours.  BNP Recent Labs  Lab 09/12/21 0544  BNP 1,793.0*    DDimer No results for input(s): DDIMER in the last 168  hours.   Radiology    ECHOCARDIOGRAM COMPLETE  Result Date: 09/11/2021    ECHOCARDIOGRAM REPORT   Patient Name:   Ronald Colon Date of Exam: 09/11/2021 Medical Rec #:  [id number]     Height:       74.0 in Accession #:    [id number]    Weight:       195.0 lb Date of Birth:  [birth year]     BSA:          2.150 m Patient Age:    65 years      BP:           94/62 mmHg Patient Gender: M             HR:           66 bpm. Exam Location:  [facility] Procedure: 2D Echo, Cardiac Doppler, Color Doppler and Intracardiac            Opacification Agent Indications:    Dyspnea  History:        Patient has prior history of Echocardiogram examinations, most                 recent 08/17/2017. Stroke, Arrythmias:Atrial Fibrillation; Risk                 Factors:Hypertension, Dyslipidemia and Former Smoker.  Sonographer:    Wenda Low Referring Phys: [place] Comments: Technically difficult study due to poor echo windows. IMPRESSIONS  1. Left ventricular ejection fraction, by estimation, is 20 to 25%. The left ventricle has severely decreased function. The left ventricle demonstrates  global hypokinesis. The left ventricular internal cavity size was moderately dilated. There is mild concentric left ventricular hypertrophy. Left ventricular diastolic parameters are consistent with Grade III diastolic dysfunction (restrictive).  2. Right ventricular systolic function is mildly reduced. The right ventricular size is mildly-to-moderately enlarged. There is moderately elevated pulmonary artery systolic pressure. The estimated right ventricular systolic pressure is 56.4 mmHg.  3. Left atrial size was severely dilated.  4. Right atrial size was severely dilated.  5. The mitral valve is abnormal. Mild mitral valve regurgitation. Moderate mitral annular calcification.  6. Tricuspid valve regurgitation is mild to moderate.  7. The aortic valve is tricuspid. There is moderate calcification of the aortic valve. There is moderate thickening of the aortic valve. Aortic valve regurgitation is trivial.  8. Aortic dilatation noted. There is mild dilatation of the aortic root, measuring 38 mm.  9. The inferior vena cava is dilated in size with <50% respiratory variability, suggesting right atrial pressure of 15 mmHg. Comparison(s): Compared to prior TTE in 2019, the LVEF is no severely reduced at 20-25% (previously 45-50%). FINDINGS  Left Ventricle: Left ventricular ejection fraction, by estimation, is 20 to 25%. The left ventricle has severely decreased function. The left ventricle demonstrates global hypokinesis. Definity contrast agent was given IV to delineate the left ventricular endocardial borders. The left ventricular internal cavity size was moderately dilated. There is mild concentric left ventricular hypertrophy. Left ventricular diastolic parameters are consistent with Grade III diastolic dysfunction (restrictive). Right Ventricle: The right ventricular size is mildly-to-moderately enlarged. Right vetricular wall thickness was not well visualized. Right ventricular systolic function is mildly reduced.  There is moderately elevated pulmonary artery systolic pressure.  The tricuspid regurgitant velocity is 2.90 m/s, and with an assumed right atrial pressure of 15 mmHg, the estimated right ventricular systolic pressure is 33.2 mmHg. Left Atrium: Left atrial size  was severely dilated. Right Atrium: Right atrial size was severely dilated. Pericardium: There is no evidence of pericardial effusion. Mitral Valve: The mitral valve is abnormal. There is mild thickening of the mitral valve leaflet(s). There is mild calcification of the mitral valve leaflet(s). Moderate mitral annular calcification. Mild mitral valve regurgitation. MV peak gradient, 5.2  mmHg. The mean mitral valve gradient is 1.0 mmHg. Tricuspid Valve: The tricuspid valve is normal in structure. Tricuspid valve regurgitation is mild to moderate. Aortic Valve: Suspect moderate low flow, low gradient aortic stenosis with AVA 0.91cm2, mean gradient 69mmHg, Vmax 2.45m/s, DI 0.3, SVI 24. The aortic valve is tricuspid. There is moderate calcification of the aortic valve. There is moderate thickening of the aortic valve. Aortic valve regurgitation is trivial. Aortic valve mean gradient measures 13.0 mmHg. Aortic valve peak gradient measures 25.2 mmHg. Aortic valve area, by VTI measures 0.91 cm. Pulmonic Valve: The pulmonic valve was normal in structure. Pulmonic valve regurgitation is mild. Aorta: Aortic dilatation noted. There is mild dilatation of the aortic root, measuring 38 mm. Venous: The inferior vena cava is dilated in size with less than 50% respiratory variability, suggesting right atrial pressure of 15 mmHg. IAS/Shunts: The atrial septum is grossly normal.  LEFT VENTRICLE PLAX 2D LVIDd:         6.10 cm   Diastology LVIDs:         5.40 cm   LV e' medial:    3.60 cm/s LV PW:         1.20 cm   LV E/e' medial:  28.1 LV IVS:        1.40 cm   LV e' lateral:   6.70 cm/s LVOT diam:     2.00 cm   LV E/e' lateral: 15.1 LV SV:         50 LV SV Index:   23 LVOT  Area:     3.14 cm  RIGHT VENTRICLE RV Basal diam:  4.00 cm RV Mid diam:    3.80 cm RV S prime:     6.13 cm/s TAPSE (M-mode): 1.8 cm LEFT ATRIUM              Index        RIGHT ATRIUM           Index LA diam:        4.50 cm  2.09 cm/m   RA Area:     30.80 cm LA Vol (A2C):   116.0 ml 53.95 ml/m  RA Volume:   110.00 ml 51.16 ml/m LA Vol (A4C):   105.0 ml 48.83 ml/m LA Biplane Vol: 113.0 ml 52.55 ml/m  AORTIC VALVE                     PULMONIC VALVE AV Area (Vmax):    0.94 cm      PV Vmax:       0.54 m/s AV Area (Vmean):   0.89 cm      PV Peak grad:  1.2 mmHg AV Area (VTI):     0.91 cm AV Vmax:           251.00 cm/s AV Vmean:          168.000 cm/s AV VTI:            0.547 m AV Peak Grad:      25.2 mmHg AV Mean Grad:      13.0 mmHg LVOT Vmax:  75.10 cm/s LVOT Vmean:        47.800 cm/s LVOT VTI:          0.158 m LVOT/AV VTI ratio: 0.29  AORTA Ao Root diam: 3.80 cm Ao Asc diam:  3.20 cm MITRAL VALVE                TRICUSPID VALVE MV Area (PHT): 4.89 cm     TR Peak grad:   33.6 mmHg MV Area VTI:   2.20 cm     TR Vmax:        290.00 cm/s MV Peak grad:  5.2 mmHg MV Mean grad:  1.0 mmHg     SHUNTS MV Vmax:       1.14 m/s     Systemic VTI:  0.16 m MV Vmean:      54.2 cm/s    Systemic Diam: 2.00 cm MV Decel Time: 155 msec MV E velocity: 101.00 cm/s MV A velocity: 31.30 cm/s MV E/A ratio:  3.23 Gwyndolyn Kaufman MD Electronically signed by Gwyndolyn Kaufman MD Signature Date/Time: 09/11/2021/4:45:31 PM    Final     Cardiac Studies     Patient Profile     Ronald Colon is a 88 y.o. 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 Eliseo Squires.  Assessment & Plan    1.Acute systolic HF - Jan 3875 echo: LVEF 45-50% - Jan 2023 echo LVEF 20-25%, global  hypokinesis, grade III dd, mild RV dysfunction, severe BAE  - in setting of AKI and diverticulitis has received IVFs this admit. Developed some fluid overload  - BNP 1793, CXR mild congestion. Decreased breath sounds bases on exam, CT A/P showed bilateral pleural effusions. Up 6 lbs since 06/2021 - given one dose IV lasix 40mg  yesterday. Incomplete I/Os, has not had weight yet this AM. Ordering daily standing weights. Signifiicant uptrend in Cr with IV lasix dose yesterday  - concern for possible low flow HF complicated by AS given significant worsening renal function with attempt at diuresis. Discussed with patient possible line placement for CVP and coox monitoring. With Cr would not be candidate for PICC, would need central line from surgery. He wishes to think over at this time and reassess in the AM. May require evaluation by advanced HF vs palliative care evaluation. Repeat CXR, BNP, labs, lactic acid in AM - patient 88 yo but lives independently, was driving up until about 2 weeks ago. We discussed how aggressive he would want to be with testing and interventions, for now he wishes to give some thought. We are limited by his renal function and active diverticulitis as well at this time.      2.Aortic stenosis - echo LVEF 20-25%, mean grad 13, AVA VTI 0.91, DI 0.29, SVI 23 - low flow low gradient moderate to severe AS   - patient 87 yo but lives independently, was driving up until about 2 weeks ago. We discussed how aggressive he would want to be with testing and interventions, for now he wishes to give some thought. We are limited by his renal function and active diverticulitis as well at this time.         3. AKI - baseline Cr 1.3-1.5, up to 2.33  on admission. Further worsening with attempt at diuresis yesterday.  - fhold diuretic today      5. Afib - 2019 EKG shows SR, afib in clinic Jan 24 and this admit. Unclear overall duration - rate controlled, if he stays in afib this  admission likely just d/c amio and rate contorl him. - has been on eliquis.      6. Diverticulitis - management per GI and primary team    For questions or updates, please contact [place] Please consult [email or web address] for contact info under        Signed, Carlyle Dolly, MD  09/13/2021, 9:35 AM

## 2021-09-13 NOTE — Progress Notes (Addendum)
Brief EGD note.  Small i.e. grade 1 distal esophageal varices. Normal GE junction at 45 cm from the incisors. Stomach empty.  Small antral polyp ablated via cold biopsy.  Single Hemoclip applied to the polypectomy site to control oozing. Prepyloric gastritis with pyloric stenosis.  Stricture dilated by passing the scope few times.  Biopsy taken from prepyloric mucosa. Normal bulbar and postbulbar mucosa. Patient tolerated the procedure well.

## 2021-09-13 NOTE — Evaluation (Signed)
Physical Therapy Evaluation Patient Details Name: Ronald Colon MRN: [id number] DOB: Jan 18, 1929 Today's Date: 09/13/2021  History of Present Illness  Ronald Colon is a 88 y.o. 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 [name] (gastroenterology).  Patient reports worsening of the abdominal pain which was described as burning sensation with reflux into the midsternal chest area, pain was of a moderate intensity, it worsens after eating and was associated with nausea and occasional vomiting.  Abdominal pain was worse today which resulted in him activating EMS and was taken to the ED for further evaluation and management.  He denies diarrhea, vomiting, fever, chills, chest pain, weakness, blurry vision or any sick contact.   Clinical Impression  Patient demonstrates slightly labored movement for sitting up at bedside with [facility] raised, required verbal cues for proper hand placement during sit to stands with fair/good carryover, ambulated in hallway with slightly flexed trunk, no loss of balance and limited mostly due to fatigue.  Patient tolerated sitting up in chair after therapy with his son present in room - nursing staff notified.  Patient will benefit from continued skilled physical therapy in hospital and recommended venue below to increase strength, balance, endurance for safe ADLs and gait.         Recommendations for follow up therapy are one component of a multi-disciplinary discharge planning process, led by the attending physician.  Recommendations may be updated based on patient status, additional functional criteria and insurance authorization.  Follow Up Recommendations Home health PT    Assistance Recommended at Discharge Set up Supervision/Assistance  Patient can return home with the  following  A little help with walking and/or transfers;A little help with bathing/dressing/bathroom;Help with stairs or ramp for entrance;Assistance with cooking/housework    Equipment Recommendations    Recommendations for Other Services       Functional Status Assessment Patient has had a recent decline in their functional status and demonstrates the ability to make significant improvements in function in a reasonable and predictable amount of time.     Precautions / Restrictions Precautions Precautions: Fall Restrictions Weight Bearing Restrictions: No      Mobility  Bed Mobility Overal bed mobility: Modified Independent             General bed mobility comments: HOB raised    Transfers Overall transfer level: Needs assistance Equipment used: Rolling walker (2 wheels) Transfers: Sit to/from Stand, Bed to chair/wheelchair/BSC Sit to Stand: Min guard, Min assist   Step pivot transfers: Min guard, Min assist       General transfer comment: increased time, labored movement    Ambulation/Gait Ambulation/Gait assistance: Min guard, Min assist Gait Distance (Feet): 60 Feet Assistive device: Rolling walker (2 wheels) Gait Pattern/deviations: Decreased step length - right, Decreased step length - left, Decreased stride length, Trunk flexed Gait velocity: decreased     General Gait Details: slow slightly labored cadence without loss of balance, limited mostly due to c/o fatigue  Stairs            Wheelchair Mobility    Modified Rankin (Stroke Patients Only)       Balance Overall balance assessment: Needs assistance Sitting-balance support: Feet supported, No upper extremity supported Sitting balance-Leahy Scale: Good Sitting balance - Comments: seated at EOB   Standing balance support: During functional activity, Bilateral  upper extremity supported Standing balance-Leahy Scale: Fair Standing balance comment: using RW                              Pertinent Vitals/Pain Pain Assessment Pain Assessment: Faces Faces Pain Scale: Hurts a little bit Pain Location: left side of low back Pain Descriptors / Indicators: Sore Pain Intervention(s): Monitored during session, Repositioned, Limited activity within patient's tolerance    Home Living Family/patient expects to be discharged to:: Private residence Living Arrangements: Spouse/significant other Available Help at Discharge: Family;Available 24 hours/day Type of Home: House Home Access: Stairs to enter Entrance Stairs-Rails: None Entrance Stairs-Number of Steps: 1   Home Layout: One level Home Equipment: Conservation officer, nature (2 wheels);Shower seat;Wheelchair - manual Additional Comments: uses walking stick outside    Prior Function Prior Level of Function : Independent/Modified Independent             Mobility Comments: household ambulator using RW, uses walking stick out side, occasionally drives ADLs Comments: assisted by family     Hand Dominance   Dominant Hand: Right    Extremity/Trunk Assessment   Upper Extremity Assessment Upper Extremity Assessment: Defer to OT evaluation    Lower Extremity Assessment Lower Extremity Assessment: Generalized weakness    Cervical / Trunk Assessment Cervical / Trunk Assessment: Kyphotic  Communication   Communication: No difficulties  Cognition Arousal/Alertness: Awake/alert Behavior During Therapy: WFL for tasks assessed/performed Overall Cognitive Status: Within Functional Limits for tasks assessed                                          General Comments      Exercises     Assessment/Plan    PT Assessment Patient needs continued PT services  PT Problem List Decreased strength;Decreased activity tolerance;Decreased balance;Decreased mobility       PT Treatment Interventions DME instruction;Gait training;Stair training;Functional mobility training;Therapeutic activities;Therapeutic  exercise;Balance training;Patient/family education    PT Goals (Current goals can be found in the Care Plan section)  Acute Rehab PT Goals Patient Stated Goal: return home with family to assist PT Goal Formulation: With patient/family Time For Goal Achievement: 09/16/21 Potential to Achieve Goals: Good    Frequency Min 3X/week     Co-evaluation               AM-PAC PT "6 Clicks" Mobility  Outcome Measure Help needed turning from your back to your side while in a flat bed without using bedrails?: None Help needed moving from lying on your back to sitting on the side of a flat bed without using bedrails?: A Little Help needed moving to and from a bed to a chair (including a wheelchair)?: A Little Help needed standing up from a chair using your arms (e.g., wheelchair or bedside chair)?: A Little Help needed to walk in hospital room?: A Little Help needed climbing 3-5 steps with a railing? : A Lot 6 Click Score: 18    End of Session   Activity Tolerance: Patient tolerated treatment well;Patient limited by fatigue Patient left: in chair;with call bell/phone within reach;with family/visitor present;with chair alarm set Nurse Communication: Mobility status PT Visit Diagnosis: Unsteadiness on feet (R26.81);Other abnormalities of gait and mobility (R26.89);Muscle weakness (generalized) (M62.81)    Time: 1440-1511 PT Time Calculation (min) (ACUTE ONLY): 31 min   Charges:   PT Evaluation $PT Eval  Moderate Complexity: 1 Mod PT Treatments $Therapeutic Activity: 23-37 mins        4:17 PM, 09/13/21 Lonell Grandchild, MPT Physical Therapist with [facility] [place] 336 [phone] office [phone] mobile phone

## 2021-09-14 ENCOUNTER — Inpatient Hospital Stay (HOSPITAL_COMMUNITY): Payer: Medicare Other

## 2021-09-14 ENCOUNTER — Encounter (HOSPITAL_COMMUNITY): Payer: Self-pay | Admitting: Internal Medicine

## 2021-09-14 ENCOUNTER — Telehealth: Payer: Self-pay | Admitting: Internal Medicine

## 2021-09-14 DIAGNOSIS — N179 Acute kidney failure, unspecified: Secondary | ICD-10-CM | POA: Diagnosis not present

## 2021-09-14 DIAGNOSIS — I4819 Other persistent atrial fibrillation: Secondary | ICD-10-CM | POA: Diagnosis not present

## 2021-09-14 DIAGNOSIS — Z7189 Other specified counseling: Secondary | ICD-10-CM

## 2021-09-14 DIAGNOSIS — I5021 Acute systolic (congestive) heart failure: Secondary | ICD-10-CM | POA: Diagnosis not present

## 2021-09-14 DIAGNOSIS — I350 Nonrheumatic aortic (valve) stenosis: Secondary | ICD-10-CM | POA: Diagnosis not present

## 2021-09-14 DIAGNOSIS — K5792 Diverticulitis of intestine, part unspecified, without perforation or abscess without bleeding: Secondary | ICD-10-CM | POA: Diagnosis not present

## 2021-09-14 DIAGNOSIS — Z515 Encounter for palliative care: Secondary | ICD-10-CM | POA: Diagnosis not present

## 2021-09-14 DIAGNOSIS — I482 Chronic atrial fibrillation, unspecified: Secondary | ICD-10-CM | POA: Diagnosis not present

## 2021-09-14 DIAGNOSIS — R9431 Abnormal electrocardiogram [ECG] [EKG]: Secondary | ICD-10-CM | POA: Diagnosis not present

## 2021-09-14 LAB — CBC
HCT: 38.5 % — ABNORMAL LOW (ref 39.0–52.0)
Hemoglobin: 12.4 g/dL — ABNORMAL LOW (ref 13.0–17.0)
MCH: 32.4 pg (ref 26.0–34.0)
MCHC: 32.2 g/dL (ref 30.0–36.0)
MCV: 100.5 fL — ABNORMAL HIGH (ref 80.0–100.0)
Platelets: 127 10*3/uL — ABNORMAL LOW (ref 150–400)
RBC: 3.83 MIL/uL — ABNORMAL LOW (ref 4.22–5.81)
RDW: 16 % — ABNORMAL HIGH (ref 11.5–15.5)
WBC: 5.6 10*3/uL (ref 4.0–10.5)
nRBC: 0 % (ref 0.0–0.2)

## 2021-09-14 LAB — RENAL FUNCTION PANEL
Albumin: 2.9 g/dL — ABNORMAL LOW (ref 3.5–5.0)
Anion gap: 10 (ref 5–15)
BUN: 40 mg/dL — ABNORMAL HIGH (ref 8–23)
CO2: 22 mmol/L (ref 22–32)
Calcium: 8.9 mg/dL (ref 8.9–10.3)
Chloride: 106 mmol/L (ref 98–111)
Creatinine, Ser: 2.26 mg/dL — ABNORMAL HIGH (ref 0.61–1.24)
GFR, Estimated: 27 mL/min — ABNORMAL LOW (ref >60)
Glucose, Bld: 101 mg/dL — ABNORMAL HIGH (ref 70–99)
Phosphorus: 2.8 mg/dL (ref 2.5–4.6)
Potassium: 4.3 mmol/L (ref 3.5–5.1)
Sodium: 138 mmol/L (ref 135–145)

## 2021-09-14 LAB — LACTIC ACID, PLASMA: Lactic Acid, Venous: 1.3 mmol/L (ref 0.5–1.9)

## 2021-09-14 LAB — BRAIN NATRIURETIC PEPTIDE: B Natriuretic Peptide: 2122 pg/mL — ABNORMAL HIGH (ref 0.0–100.0)

## 2021-09-14 LAB — MAGNESIUM: Magnesium: 2.4 mg/dL (ref 1.7–2.4)

## 2021-09-14 LAB — SURGICAL PATHOLOGY

## 2021-09-14 MED ORDER — PANTOPRAZOLE SODIUM 40 MG PO TBEC
40.0000 mg | DELAYED_RELEASE_TABLET | Freq: Every day | ORAL | Status: DC
  Administered 2021-09-14 – 2021-09-15 (×2): 40 mg via ORAL
  Filled 2021-09-14 (×2): qty 1

## 2021-09-14 MED ORDER — PROCHLORPERAZINE EDISYLATE 10 MG/2ML IJ SOLN: 5.0000 mg | Freq: Four times a day (QID) | INTRAMUSCULAR | Status: DC | PRN

## 2021-09-14 NOTE — Progress Notes (Signed)
OT Cancellation Note  Patient Details Name: Ronald Colon MRN: [id number] DOB: 05-22-1929   Cancelled Treatment:    Reason Eval/Treat Not Completed: OT screened, no needs identified, will sign off. Patient lives with wife and has family available to come and help him if needed. BUE strength is functional and he reports no concerns with returning home. Has all necessary DME including shower chair, RW, and manual w/c if needed. Thank you for the referral.   Ailene Ravel, OTR/L,CBIS  [phone]  09/14/2021, 8:50 AM

## 2021-09-14 NOTE — Consult Note (Signed)
Consultation Note Date: 09/14/2021   Patient Name: Ronald Colon  DOB: Jul 02, 1929  MRN: [id number]  Age / Sex: 88 y.o., male  PCP: [name], MD Referring Physician: Mercy Riding, MD  Reason for Consultation: Establishing goals of care  HPI/Patient Profile: 88 y.o. 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   Clinical Assessment and Goals of Care: I have reviewed medical records including EPIC notes, labs and imaging, received report from RN, assessed the patient.  [name] is lying quietly in bed, resting comfortably.  He is sleeping soundly, but wakes when I touch his arm and call his name.  He is alert and oriented, able to make his basic needs known.  Somewhat limited conversation today due to his acute health concerns and advanced age.  He is agreeable for me to call his son, Cap Massi for an update.  Call to son, [name].  Left voicemail message on home phone.  Call to cell phone, unable to leave voicemail message.    Call to daughter-in-law, [name] to discuss diagnosis prognosis, [place], EOL wishes, disposition and options.  I introduced Palliative Medicine as specialized medical care for people living with serious illness. It focuses on providing relief from the symptoms and stress of a serious illness. The goal is to improve quality of life for both the patient and the family.  We discussed a brief life review of the patient.  [name] is still married to [place].  They live  independently.  Margie's health is fair, but she has profound hearing loss.  Since the pandemic [name] has been getting groceries and cooking food, cleaning also.  [name] is able to do his ADLs.  We then focused on their current illness.  We talk in detail about GI consult and recommendations.  We also talked in detail about cardiology consult and recommendations.  The natural disease trajectory and expectations at EOL were discussed.  Palliative Care services outpatient were explained and offered.  At this point [name] is agreeable to outpatient palliative services, but feels like she needs to discuss this with her husband, [name], day old.  Share that PCP, Dr. Hilma Favors can set up outpatient palliative services at any point.  Discussed the importance of continued conversation with family and the medical providers regarding overall plan of care and treatment options, ensuring decisions are within the context of the patients values and GOCs.  Questions and concerns were addressed.  The family was encouraged to call with questions or concerns.  PMT will continue to support holistically.  Conference with attending, bedside nursing staff, transition of care team related to patient condition, needs, goals of care, disposition.  HCPOA NEXT OF KIN -only child, son [name], would be healthcare surrogate.  Mrs. Doughten is still alive, but has profound hearing loss.    SUMMARY OF RECOMMENDATIONS   At this point continue to treat the treatable but no CPR or intubation Follow-up with GI and cardiology outpatient Home with home health Considering outpatient palliative, will work with PCP   Code Status/Advance Care Planning: DNR  Symptom Management:  Per hospitalist, no additional needs at this time.  Palliative Prophylaxis:  Frequent Pain Assessment and Oral Care  Additional Recommendations (Limitations, Scope, Preferences): At this point continue to treat the treatable but no CPR or  intubation  Psycho-social/Spiritual:  Desire for further Chaplaincy support:no Additional Recommendations: Caregiving  Support/Resources  Prognosis:  Unable to determine, based on outcomes.  6 months or less would not be surprising based on chronic illness burden including heart failure, declining functional status, advanced age.  Discharge Planning:  At this point agreeable to home health services.  Would benefit from outpatient palliative services.       Primary Diagnoses: Present on Admission:  Nausea with vomiting  GERD (gastroesophageal reflux disease)   I have reviewed the medical record, interviewed the patient and family, and examined the patient. The following aspects are pertinent.  Past Medical History:  Diagnosis Date   Aortic stenosis    Mild February 2018   Arthritis    Atrial fibrillation [facility])    Diagnosed January 2018   AVM (arteriovenous malformation)    Carotid stenosis    Diverticulosis    Essential hypertension    Gout    Hx of adenomatous colonic polyps    Hyperlipidemia    IDA (iron deficiency anemia)    Internal hemorrhoids    Rheumatic fever 1937   Stroke ([place]) 09/10/2016   Tubular adenoma of colon    Social History   Socioeconomic History   Marital status: Married    Spouse name: Not on file   Number of children: 1   Years of education: Not on file   Highest education level: Not on file  Occupational History   Occupation: Retired  Tobacco Use   Smoking status: Former    Packs/day: 1.00    Years: 20.00    Pack years: 20.00    Types: Cigarettes    Quit date: 1980    Years since quitting: 43.1   Smokeless tobacco: Never  Vaping Use   Vaping Use: Never used  Substance and Sexual Activity   Alcohol use: No   Drug use: No   Sexual activity: Not on file  Other Topics Concern   Not on file  Social History Narrative   Not on file   Social Determinants of Health   Financial Resource Strain: Not on file  Food Insecurity: Not on  file  Transportation Needs: Not on file  Physical Activity: Not on file  Stress: Not on file  Social Connections: Not on file   Family History  Problem Relation Age of Onset   CVA Mother 62   Pneumonia Father 69   Colon cancer Neg Hx    Scheduled Meds:  amiodarone  100 mg Oral Daily   atorvastatin  40 mg Oral q1800   ezetimibe  10 mg Oral Daily   feeding supplement  237 mL Oral BID BM   metoprolol succinate  12.5 mg Oral Daily   pantoprazole (PROTONIX) IV  40 mg Intravenous Q12H   polyethylene glycol  17 g Oral Daily   sucralfate  1  g Oral TID WC & HS   Continuous Infusions:  ampicillin-sulbactam (UNASYN) IV Stopped (09/14/21 0309)   PRN Meds:.alum & mag hydroxide-simeth, morphine injection, ondansetron (ZOFRAN) IV, prochlorperazine Medications Prior to Admission:  Prior to Admission medications   Medication Sig Start Date End Date Taking? Authorizing Provider  acetaminophen (TYLENOL) 325 MG tablet Take 650 mg by mouth daily as needed for mild pain or headache.    Yes [provider]  allopurinol (ZYLOPRIM) 300 MG tablet Take 300 mg by mouth daily.   Yes [provider]  alum & mag hydroxide-simeth (MAALOX/MYLANTA) 200-200-20 MG/5ML suspension Take by mouth every 6 (six) hours as needed for indigestion or heartburn.   Yes [provider]  amiodarone (PACERONE) 200 MG tablet Take 0.5 tablets (100 mg total) by mouth daily. 05/07/17  Yes Satira Sark, MD  apixaban (ELIQUIS) 2.5 MG TABS tablet TAKE 1 TABLET BY MOUTH TWICE A DAY 08/15/21  Yes Satira Sark, MD  atorvastatin (LIPITOR) 40 MG tablet Take 1 tablet (40 mg total) by mouth daily at 6 PM. 09/19/16  Yes Trinh, Janalyn Harder, PA-C  colchicine 0.6 MG tablet Take 0.6 mg by mouth 2 (two) times daily as needed (gout).   Yes [provider]  ezetimibe (ZETIA) 10 MG tablet Take 10 mg by mouth daily.   Yes [provider]  FeFum-FePoly-FA-B Cmp-C-Biot (FOLIVANE-PLUS) CAPS TAKE 1 CAPSULE  BY MOUTH EVERY DAY IN THE MORNING Patient taking differently: Take 1 capsule by mouth daily. 10/29/18  Yes Pyrtle, Lajuan Lines, MD  fish oil-omega-3 fatty acids 1000 MG capsule Take 1 g by mouth daily.   Yes [provider]  furosemide (LASIX) 20 MG tablet Take 20 mg by mouth daily.   Yes [provider]  metoprolol tartrate (LOPRESSOR) 25 MG tablet Take 0.5 tablets (12.5 mg total) by mouth 2 (two) times daily. 01/08/17 09/10/21 Yes Satira Sark, MD  pantoprazole (PROTONIX) 40 MG tablet Take 1 tablet (40 mg total) by mouth 2 (two) times daily. Patient taking differently: Take by mouth daily. 07/03/21 09/10/21 Yes Carlan, Chelsea L, NP  prochlorperazine (COMPAZINE) 5 MG tablet Take 1 tablet (5 mg total) by mouth every 8 (eight) hours as needed for nausea or vomiting. 08/21/21  Yes Harvel Quale, MD  ondansetron (ZOFRAN) 4 MG tablet Take 1 tablet (4 mg total) by mouth every 8 (eight) hours as needed for nausea or vomiting. Patient not taking: Reported on 09/10/2021 08/16/21   Harvel Quale, MD  sucralfate (CARAFATE) 1 GM/10ML suspension Take 10 mLs (1 g total) by mouth 4 (four) times daily -  with meals and at bedtime. Patient not taking: Reported on 09/10/2021 07/03/21   Gabriel Rung, NP   No Known Allergies Review of Systems  Unable to perform ROS: Age   Physical Exam Vitals and nursing note reviewed.  HENT:     Head: Normocephalic and atraumatic.  Pulmonary:     Effort: Pulmonary effort is normal. No respiratory distress.  Skin:    General: Skin is warm and dry.  Neurological:     Mental Status: He is alert and oriented to person, place, and time.  Psychiatric:        Mood and Affect: Mood normal.        Behavior: Behavior normal.    Vital Signs: BP 133/79 (BP Location: Right Arm)    Pulse 77    Temp (!) 97.5 F (36.4 C) (Oral)    Resp 19  Ht 6\' 2"  (1.88 m)    Wt 87.6 kg    SpO2 97%    BMI 24.80 kg/m  Pain Scale: 0-10   Pain Score: 0-No  pain   SpO2: SpO2: 97 % O2 Device:SpO2: 97 % O2 Flow Rate: .O2 Flow Rate (L/min): 2 L/min  IO: Intake/output summary:  Intake/Output Summary (Last 24 hours) at 09/14/2021 1302 Last data filed at 09/14/2021 1045 Gross per 24 hour  Intake 550.07 ml  Output --  Net 550.07 ml    LBM: Last BM Date: 09/10/21 Baseline Weight: Weight: 88.5 kg Most recent weight: Weight: 87.6 kg     Palliative Assessment/Data:   Flowsheet Rows    Flowsheet Row Most Recent Value  Intake Tab   Referral Department Hospitalist  Unit at Time of Referral Cardiac/Telemetry Unit  Palliative Care Primary Diagnosis Cardiac  Date Notified 09/14/21  Palliative Care Type New Palliative care  Reason for referral Clarify Goals of Care  Date of Admission 09/10/21  Date first seen by Palliative Care 09/14/21  # of days Palliative referral response time 0 Day(s)  # of days IP prior to Palliative referral 4  Clinical Assessment   Palliative Performance Scale Score 40%  Pain Max last 24 hours Not able to report  Pain Min Last 24 hours Not able to report  Dyspnea Max Last 24 Hours Not able to report  Dyspnea Min Last 24 hours Not able to report  Psychosocial & Spiritual Assessment   Palliative Care Outcomes        Time In: 1030   Time Out: 1145 Time Total: 75 minutes  Greater than 50%  of this time was spent counseling and coordinating care related to the above assessment and plan.  Signed by: Drue Novel, NP   Please contact Palliative Medicine Team phone at [phone] for questions and concerns.  For individual provider: See Shea Evans

## 2021-09-14 NOTE — Progress Notes (Signed)
Progress Note  Patient Name: Ronald Colon Date of Encounter: 09/14/2021  [facility] Cardiologist: [name], MD   Subjective   EGD performed yesterday showed grade 1 esophageal varices with gastritis and pyloric stenosis which was dilated.  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  EF 20%.  Came in with abdominal discomfort  Currently sitting up in chair, breathing comfortably. Inpatient Medications    Scheduled Meds:  amiodarone  100 mg Oral Daily   atorvastatin  40 mg Oral q1800   ezetimibe  10 mg Oral Daily   feeding supplement  237 mL Oral BID BM   metoprolol succinate  12.5 mg Oral Daily   pantoprazole (PROTONIX) IV  40 mg Intravenous Q12H   polyethylene glycol  17 g Oral Daily   sucralfate  1 g Oral TID WC & HS   Continuous Infusions:  ampicillin-sulbactam (UNASYN) IV 3 g (09/14/21 0239)   PRN Meds: alum & mag hydroxide-simeth, morphine injection, ondansetron (ZOFRAN) IV, prochlorperazine   Vital Signs    Vitals:   09/13/21 1520 09/13/21 1958 09/13/21 2314 09/14/21 0545  BP: 110/66 113/72 126/90 133/79  Pulse: 74 69 72 77  Resp: 18 17 18 19   Temp: 97.7 F (36.5 C) 97.8 F (36.6 C) 97.7 F (36.5 C) (!) 97.5 F (36.4 C)  TempSrc: Oral Oral Oral Oral  SpO2: 100% 99% 99% 97%  Weight:    87.6 kg  Height:        Intake/Output Summary (Last 24 hours) at 09/14/2021 0906 Last data filed at 09/13/2021 1502 Gross per 24 hour  Intake 559.01 ml  Output --  Net 559.01 ml   Last 3 Weights 09/14/2021 09/10/2021 09/05/2021  Weight (lbs) 193 lb 2 oz 195 lb 194 lb 12.8 oz  Weight (kg) 87.6 kg 88.451 kg 88.361 kg      Telemetry    Rate controlled atrial fibrillation on amiodarone- Personally Reviewed  ECG    No new- Personally Reviewed  Physical Exam   GEN: No acute distress.   Neck: No JVD Cardiac: RRR, 2/6 murmur, rubs, or gallops.  Respiratory: Clear to auscultation bilaterally. GI: Soft, nontender, non-distended  MS: No edema; No deformity. Neuro:   Nonfocal  Psych: Normal affect   Labs    High Sensitivity Troponin:   Recent Labs  Lab 09/10/21 1907 09/10/21 2114  TROPONINIHS 16 16     Chemistry Recent Labs  Lab 09/10/21 1907 09/11/21 0454 09/12/21 0544 09/13/21 0548 09/14/21 0609  NA 137 140 138 136 138  K 4.3 4.0 5.2* 4.2 4.3  CL 108 110 108 106 106  CO2 24 24 22 24 22   GLUCOSE 117* 105* 98 105* 101*  BUN 45* 45* 41* 41* 40*  CREATININE 2.33* 2.22* 2.15* 2.42* 2.26*  CALCIUM 9.0 8.8* 8.9 8.6* 8.9  MG  --  2.5*  --   --  2.4  PROT 6.7 6.0* 5.9*  --   --   ALBUMIN 3.4* 3.0* 2.9*  --  2.9*  AST 42* 36 34  --   --   ALT 28 25 23   --   --   ALKPHOS 84 73 67  --   --   BILITOT 1.5* 1.3* 1.2  --   --   GFRNONAA 26* 27* 29* 25* 27*  ANIONGAP 5 6 8 6 10     Lipids No results for input(s): CHOL, TRIG, HDL, LABVLDL, LDLCALC, CHOLHDL in the last 168 hours.  Hematology Recent Labs  Lab 09/13/21 [phone]  09/13/21 1200 09/14/21 0609  WBC 6.1 5.9 5.6  RBC 3.84* 3.85* 3.83*  HGB 12.4* 13.0 12.4*  HCT 39.4 40.1 38.5*  MCV 102.6* 104.2* 100.5*  MCH 32.3 33.8 32.4  MCHC 31.5 32.4 32.2  RDW 16.3* 16.4* 16.0*  PLT 121* 161 127*   Thyroid No results for input(s): TSH, FREET4 in the last 168 hours.  BNP Recent Labs  Lab 09/12/21 0544 09/14/21 0609  BNP 1,793.0* 2,122.0*    DDimer No results for input(s): DDIMER in the last 168 hours.   Radiology    DG Chest Port 1 View  Result Date: 09/14/2021 CLINICAL DATA:  Shortness of breath EXAM: PORTABLE CHEST 1 VIEW COMPARISON:  Previous studies including the examination of 09/10/2021 FINDINGS: Transverse diameter of heart is increased. Central pulmonary vessels are prominent. There is improvement in aeration of left lower lung fields. Left lateral CP angle is clear. There is haziness in the right lower lung fields suggesting increase in right pleural effusion. No new focal pulmonary infiltrates are noted. There is slight prominence of interstitial markings in the right parahilar  region and right lower lung fields. There is no pneumothorax. IMPRESSION: Cardiomegaly. There is slight prominence of interstitial markings in the right parahilar region and right lower lung fields, possibly suggesting asymmetric pulmonary edema. Small right pleural effusion. There is interval decrease in the left pleural effusion. Electronically Signed   By: Elmer Picker M.D.   On: 09/14/2021 08:07    Cardiac Studies   Echo as described below  Patient Profile     88 y.o. male here with acute systolic heart failure EF 20% with secondary pulmonary hypertension 49 mmHg with epigastric pain/gastritis.  Chronic atrial fibrillation.  Attentionally low-flow severe aortic stenosis  Assessment & Plan    Acute systolic heart failure - EF is down from 45 to 50% to 20 to 25%. - BNP 793 with mild congestion noted on chest x-ray.  He was up a few pounds, 6 pounds since November.  Creatinine increased with IV Lasix dose. -He was living independently and up to 2 weeks ago was still driving.  Discussed with Dr. Cyndia Skeeters.  Palliative care team will get involved to help with future decision making. - Obviously goal-directed medical therapy is limited by renal function -Low-dose metoprolol succinate 12.5, amiodarone low-dose 100 mg a day.  I would not make any medication changes at this time.  Upon discharge may benefit from low-dose Lasix such as 20 mg daily.  Appears fairly compensated at this time.  Aortic stenosis - Likely underestimated secondary to EF of 25% may be low-flow low gradient moderate to severe aortic stenosis.  Dimensionless index was 0.29.  Mean gradient was 13 mmHg.  AKI - Creatinine at baseline was approximately 1.3, 2.3 on admission.  Diuresed him and creatinine worsened.  Persistent atrial fibrillation -Back in 2019 EKG showed sinus rhythm.  He was then noted to be in atrial fibrillation in January of this year.  Unclear duration.  Overall reasonably rate controlled. -Has been on  Eliquis for chronic anticoagulation.  Gastritis - Appreciate GI evaluation.  Grade 1 esophageal varices noted  Agree with palliative care consultation.  No aggressive medical therapies at this time.  Continue with oral medication management.  Discussed with him.  For questions or updates, please contact [place] Please consult [email or web address] for contact info under        Signed, Candee Furbish, MD  09/14/2021, 9:06 AM

## 2021-09-14 NOTE — Progress Notes (Signed)
Subjective: Patient states he is doing well today.  Tolerating diet.  Denies any abdominal pain.  Objective: Vital signs in last 24 hours: Temp:  [97.5 F (36.4 C)-97.8 F (36.6 C)] 97.5 F (36.4 C) (02/02 0545) Pulse Rate:  [66-77] 77 (02/02 0545) Resp:  [17-19] 19 (02/02 0545) BP: (110-133)/(66-90) 133/79 (02/02 0545) SpO2:  [97 %-100 %] 97 % (02/02 0545) Weight:  [87.6 kg] 87.6 kg (02/02 0545) Last BM Date: 09/10/21 General:   Alert and oriented, pleasant Head:  Normocephalic and atraumatic. Eyes:  No icterus, sclera clear. Conjuctiva pink.  Abdomen:  Bowel sounds present, soft, non-tender, non-distended. No HSM or hernias noted. No rebound or guarding. No masses appreciated  Msk:  Symmetrical without gross deformities. Normal posture. Extremities:  Without clubbing or edema. Neurologic:  Alert and  oriented x4;  grossly normal neurologically. Skin:  Warm and dry, intact without significant lesions.  Cervical Nodes:  No significant cervical adenopathy. Psych:  Alert and cooperative. Normal mood and affect.  Intake/Output from previous day: 02/01 0701 - 02/02 0700 In: 559 [I.V.:500; IV Piggyback:59] Out: -  Intake/Output this shift: Total I/O In: 491.1 [P.O.:350; IV Piggyback:141.1] Out: -   Lab Results: Recent Labs    09/13/21 0548 09/13/21 1200 09/14/21 0609  WBC 6.1 5.9 5.6  HGB 12.4* 13.0 12.4*  HCT 39.4 40.1 38.5*  PLT 121* 161 127*   BMET Recent Labs    09/12/21 0544 09/13/21 0548 09/14/21 0609  NA 138 136 138  K 5.2* 4.2 4.3  CL 108 106 106  CO2 22 24 22   GLUCOSE 98 105* 101*  BUN 41* 41* 40*  CREATININE 2.15* 2.42* 2.26*  CALCIUM 8.9 8.6* 8.9   LFT Recent Labs    09/12/21 0544 09/14/21 0609  PROT 5.9*  --   ALBUMIN 2.9* 2.9*  AST 34  --   ALT 23  --   ALKPHOS 67  --   BILITOT 1.2  --    PT/INR No results for input(s): LABPROT, INR in the last 72 hours. Hepatitis Panel No results for input(s): HEPBSAG, HCVAB, HEPAIGM, HEPBIGM in the  last 72 hours.   Studies/Results: DG Chest Port 1 View  Result Date: 09/14/2021 CLINICAL DATA:  Shortness of breath EXAM: PORTABLE CHEST 1 VIEW COMPARISON:  Previous studies including the examination of 09/10/2021 FINDINGS: Transverse diameter of heart is increased. Central pulmonary vessels are prominent. There is improvement in aeration of left lower lung fields. Left lateral CP angle is clear. There is haziness in the right lower lung fields suggesting increase in right pleural effusion. No new focal pulmonary infiltrates are noted. There is slight prominence of interstitial markings in the right parahilar region and right lower lung fields. There is no pneumothorax. IMPRESSION: Cardiomegaly. There is slight prominence of interstitial markings in the right parahilar region and right lower lung fields, possibly suggesting asymmetric pulmonary edema. Small right pleural effusion. There is interval decrease in the left pleural effusion. Electronically Signed   By: Elmer Picker M.D.   On: 09/14/2021 08:07    Assessment: *Acute sigmoid diverticulitis *Epigastric pain/discomfort *Cirrhosis with grade 1 esophageal varices  Plan: Patient continues to improve clinically.  Recommend full course of antibiotics for acute diverticulitis.  Tolerating diet without any issues.  Grade 1 esophageal varices on EGD 09/13/2021.  Will need GI follow-up for cirrhosis management which we will arrange.  Recommend daily PPI.  Okay to resume Eliquis today from GI standpoint.  We will follow-up on pathology from EGD.  If  the patient needs MRI for any reason in the coming months will need KUB prior given clip placement yesterday.  GI to sign off, please call with any concerns.  Elon Alas. Abbey Chatters, D.O. Gastroenterology and Hepatology [facility]   LOS: 4 days    09/14/2021, 11:35 AM

## 2021-09-14 NOTE — Progress Notes (Signed)
PROGRESS NOTE  Ronald Colon PNT:[id number] DOB: 1929/03/19   PCP: [name], MD  Patient is from: Home. Lives with wife.  Uses walker at baseline.  DOA: 09/10/2021 LOS: 4  Chief complaints:  Chief Complaint  Patient presents with   Abdominal Pain     Brief Narrative / Interim history: 88 y.o. 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 [name] (GI).  Patient reports worsening of the abdominal pain which was described as burning sensation with reflux into the midsternal chest area, pain was of a moderate intensity, it worsens after eating and was associated with nausea and occasional vomiting.  Echo done as planned for outpatient and EF found to be 20%.   EGD with grade 1 esophageal varices, gastric polyp (biopsied), gastritis (biopsied) and pyloric stenosis which was dilated.  Cardiology and palliative following.  Subjective: Seen and examined earlier this morning.  No major events overnight of this morning.  No complaints.  He denies chest pain, shortness of breath, GI or UTI symptoms.  Extensive discussion about CODE STATUS, pros and cons of CPR.   Objective: Vitals:   09/13/21 1958 09/13/21 2314 09/14/21 0545 09/14/21 1300  BP: 113/72 126/90 133/79 125/85  Pulse: 69 72 77 77  Resp: 17 18 19 20   Temp: 97.8 F (36.6 C) 97.7 F (36.5 C) (!) 97.5 F (36.4 C) (!) 97.4 F (36.3 C)  TempSrc: Oral Oral Oral Axillary  SpO2: 99% 99% 97% 99%  Weight:   87.6 kg   Height:        Examination:  GENERAL: Frail looking elderly male.  Nontoxic. HEENT: MMM.  Vision grossly intact.  Diminished hearing. NECK: Supple.  Notable JVD. RESP: 99% on RA at rest.  No IWOB.  Fair aeration bilaterally. CVS:  RRR. Heart sounds normal.  ABD/GI/GU: BS+. Abd soft, NTND.  MSK/EXT:  Moves  extremities. No apparent deformity. No edema.  SKIN: no apparent skin lesion or wound NEURO: Awake and alert. Oriented appropriately.  No apparent focal neuro deficit. PSYCH: Calm. Normal affect.   Procedures:  2/1-EGD with grade 1 esophageal varices, gastric polyp (biopsied), gastritis (biopsied) and pyloric stenosis which was dilated.  Microbiology summarized: COVID-19 and influenza PCR nonreactive.  Assessment and plan Acute combined CHF: TTE with LVEF of 20 to 25%, global hypokinesis, G3 DD, severe LAE and RAE and RVSP of 49.   BNP 1800>> 2000.  Not on diuretics due to AKI.  Intake and output was not captured well. Currently without respiratory distress but prominent JVD.  Slight improvement in creatinine. -Cardiology following -GDMT-on low-dose Toprol-XL.  Not a candidate for ARNI, ARB or ACEI due to renal function.  BiDil? -Monitor intake and output closely -Sodium and fluid restrictions.   Epigastric abdominal pain likely due to gastritis-EGD as above.  H&H stable. Recent Labs    08/16/21 1544 09/10/21 1907 09/11/21 0454 09/12/21 0544 09/13/21 0548 09/13/21 1200 09/14/21 0609  HGB 12.4* 13.3 12.3* 12.7* 12.4* 13.0 12.4*  -Appreciate GI recs  -Okay to resume Eliquis  -Daily PPI  -KUB prior to MRI given clips. -Recheck CBC in the morning  Sigmoid diverticulitis?  Abdominal exam benign. -Change IV Zosyn to IV Unasyn -Can be discharged on p.o. Augmentin for total of 7 days  AKI/azotemia: Cr 1.4 about 4 years ago.  Could be progressive CKD Recent  Labs    09/10/21 1907 09/11/21 0454 09/12/21 0544 09/13/21 0548 09/14/21 0609  BUN 45* 45* 41* 41* 40*  CREATININE 2.33* 2.22* 2.15* 2.42* 2.26*  -Continue holding diuretics -Avoid or minimize nephrotoxic meds.  Chronic atrial fibrillation: Rate controlled.  CHA2DS2-VASc score> 3 -Continue home Lopressor and amiodarone -Resume Eliquis   Hyperkalemia: Likely due to AKI.  Resolved.  Prolonged QTc-exaggerated by wide  QRS/RBBB -Minimize QT prolonging drugs. -Optimize K and Mg.   Hyperlipidemia -Continue Lipitor and Zetia  Essential hypertension: Normotensive. -Continue metoprolol  Thrombocytopenia: Reactive.  -Monitor.  Physical deconditioning -PT/OT eval  Goal of care counseling: Patient with comorbidity as above.  Poor long-term prognosis.  Ideal candidate for home hospice.  Discussed pros and cons of CPR and intubation.  Given his age and comorbidity, I believe those measures would pose more harm than benefit.  Patient voiced understanding and states "let me go when God says it is time".  -CODE STATUS changed to DNR/DNI. -Palliative medicine consulted for further goals of care discussion  Body mass index is 24.8 kg/m.         DVT prophylaxis:  SCDs Start: 09/11/21 0035  Code Status: Full code Family Communication: Updated patient's son at the bedside on 2/1.  None at bedside today. Level of care: Telemetry Status is: Inpatient Remains inpatient appropriate because: Acute combined CHF, acute diverticulitis     Final disposition: Likely home with home health.      Consultants:  Cardiology Gastroenterology Palliative medicine   Sch Meds:  Scheduled Meds:  amiodarone  100 mg Oral Daily   atorvastatin  40 mg Oral q1800   ezetimibe  10 mg Oral Daily   feeding supplement  237 mL Oral BID BM   metoprolol succinate  12.5 mg Oral Daily   pantoprazole  40 mg Oral Daily   polyethylene glycol  17 g Oral Daily   sucralfate  1 g Oral TID WC & HS   Continuous Infusions:  ampicillin-sulbactam (UNASYN) IV 3 g (09/14/21 1341)   PRN Meds:.alum & mag hydroxide-simeth, morphine injection, ondansetron (ZOFRAN) IV, prochlorperazine  Antimicrobials: Anti-infectives (From admission, onward)    Start     Dose/Rate Route Frequency Ordered Stop   09/13/21 1400  Ampicillin-Sulbactam (UNASYN) 3 g in sodium chloride 0.9 % 100 mL IVPB        3 g 200 mL/hr over 30 Minutes Intravenous Every  12 hours 09/13/21 1037     09/10/21 2230  piperacillin-tazobactam (ZOSYN) IVPB 3.375 g  Status:  Discontinued        3.375 g 12.5 mL/hr over 240 Minutes Intravenous Every 8 hours 09/10/21 2227 09/13/21 0810        I have personally reviewed the following labs and images: CBC: Recent Labs  Lab 09/10/21 1907 09/11/21 0454 09/12/21 0544 09/13/21 0548 09/13/21 1200 09/14/21 0609  WBC 6.2 6.0 6.2 6.1 5.9 5.6  NEUTROABS 4.6  --   --   --  4.3  --   HGB 13.3 12.3* 12.7* 12.4* 13.0 12.4*  HCT 41.4 38.3* 39.0 39.4 40.1 38.5*  MCV 102.2* 103.8* 103.4* 102.6* 104.2* 100.5*  PLT 140* 128* 144* 121* 161 127*   BMP &GFR Recent Labs  Lab 09/10/21 1907 09/11/21 0454 09/12/21 0544 09/13/21 0548 09/14/21 0609  NA 137 140 138 136 138  K 4.3 4.0 5.2* 4.2 4.3  CL 108 110 108 106 106  CO2 24 24 22 24 22   GLUCOSE 117* 105* 98 105* 101*  BUN 45* 45* 41*  41* 40*  CREATININE 2.33* 2.22* 2.15* 2.42* 2.26*  CALCIUM 9.0 8.8* 8.9 8.6* 8.9  MG  --  2.5*  --   --  2.4  PHOS  --  4.1  --   --  2.8   Estimated Creatinine Clearance: 25.3 mL/min (A) (by C-G formula based on SCr of 2.26 mg/dL (H)). Liver & Pancreas: Recent Labs  Lab 09/10/21 1907 09/11/21 0454 09/12/21 0544 09/14/21 0609  AST 42* 36 34  --   ALT 28 25 23   --   ALKPHOS 84 73 67  --   BILITOT 1.5* 1.3* 1.2  --   PROT 6.7 6.0* 5.9*  --   ALBUMIN 3.4* 3.0* 2.9* 2.9*   Recent Labs  Lab 09/10/21 1907  LIPASE 49   No results for input(s): AMMONIA in the last 168 hours. Diabetic: No results for input(s): HGBA1C in the last 72 hours. No results for input(s): GLUCAP in the last 168 hours. Cardiac Enzymes: No results for input(s): CKTOTAL, CKMB, CKMBINDEX, TROPONINI in the last 168 hours. No results for input(s): PROBNP in the last 8760 hours. Coagulation Profile: No results for input(s): INR, PROTIME in the last 168 hours. Thyroid Function Tests: No results for input(s): TSH, T4TOTAL, FREET4, T3FREE, THYROIDAB in the last  72 hours. Lipid Profile: No results for input(s): CHOL, HDL, LDLCALC, TRIG, CHOLHDL, LDLDIRECT in the last 72 hours. Anemia Panel: Recent Labs    09/11/21 1900  VITAMINB12 1,055*  FOLATE 40.8   Urine analysis:    Component Value Date/Time   COLORURINE YELLOW 09/10/2021 2103   APPEARANCEUR CLEAR 09/10/2021 2103   LABSPEC 1.020 09/10/2021 2103   PHURINE 5.5 09/10/2021 2103   GLUCOSEU NEGATIVE 09/10/2021 2103   HGBUR NEGATIVE 09/10/2021 2103   BILIRUBINUR NEGATIVE 09/10/2021 2103   KETONESUR NEGATIVE 09/10/2021 2103   PROTEINUR NEGATIVE 09/10/2021 2103   NITRITE NEGATIVE 09/10/2021 2103   LEUKOCYTESUR NEGATIVE 09/10/2021 2103   Sepsis Labs: Invalid input(s): PROCALCITONIN, [place]  Microbiology: Recent Results (from the past 240 hour(s))  Resp Panel by RT-PCR (Flu A&B, Covid) Nasopharyngeal Swab     Status: None   Collection Time: 09/10/21 11:09 PM   Specimen: Nasopharyngeal Swab; Nasopharyngeal(NP) swabs in vial transport medium  Result Value Ref Range Status   SARS Coronavirus 2 by RT PCR NEGATIVE NEGATIVE Final    Comment: (NOTE) SARS-CoV-2 target nucleic acids are NOT DETECTED.  The SARS-CoV-2 RNA is generally detectable in upper respiratory specimens during the acute phase of infection. The lowest concentration of SARS-CoV-2 viral copies this assay can detect is 138 copies/mL. A negative result does not preclude SARS-Cov-2 infection and should not be used as the sole basis for treatment or other patient management decisions. A negative result may occur with  improper specimen collection/handling, submission of specimen other than nasopharyngeal swab, presence of viral mutation(s) within the areas targeted by this assay, and inadequate number of viral copies(<138 copies/mL). A negative result must be combined with clinical observations, patient history, and epidemiological information. The expected result is Negative.  Fact Sheet for Patients:   EntrepreneurPulse.com.au  Fact Sheet for Healthcare Providers:  IncredibleEmployment.be  This test is no t yet approved or cleared by the [place] FDA and  has been authorized for detection and/or diagnosis of SARS-CoV-2 by FDA under an Emergency Use Authorization (EUA). This EUA will remain  in effect (meaning this test can be used) for the duration of the COVID-19 declaration under Section 564(b)(1) of the Act, 21 U.S.C.section 360bbb-3(b)(1), unless the authorization  is terminated  or revoked sooner.       Influenza A by PCR NEGATIVE NEGATIVE Final   Influenza B by PCR NEGATIVE NEGATIVE Final    Comment: (NOTE) The Xpert Xpress SARS-CoV-2/FLU/RSV plus assay is intended as an aid in the diagnosis of influenza from Nasopharyngeal swab specimens and should not be used as a sole basis for treatment. Nasal washings and aspirates are unacceptable for Xpert Xpress SARS-CoV-2/FLU/RSV testing.  Fact Sheet for Patients: EntrepreneurPulse.com.au  Fact Sheet for Healthcare Providers: IncredibleEmployment.be  This test is not yet approved or cleared by the [place] FDA and has been authorized for detection and/or diagnosis of SARS-CoV-2 by FDA under an Emergency Use Authorization (EUA). This EUA will remain in effect (meaning this test can be used) for the duration of the COVID-19 declaration under Section 564(b)(1) of the Act, 21 U.S.C. section 360bbb-3(b)(1), unless the authorization is terminated or revoked.  Performed at [facility], [address], [place], [place] [address]     Radiology Studies: [facility] Chest [facility] 1 View  Result Date: 09/14/2021 CLINICAL DATA:  Shortness of breath EXAM: PORTABLE CHEST 1 VIEW COMPARISON:  Previous studies including the examination of 09/10/2021 FINDINGS: Transverse diameter of heart is increased. Central pulmonary vessels are prominent. There is improvement in  aeration of left lower lung fields. Left lateral CP angle is clear. There is haziness in the right lower lung fields suggesting increase in right pleural effusion. No new focal pulmonary infiltrates are noted. There is slight prominence of interstitial markings in the right parahilar region and right lower lung fields. There is no pneumothorax. IMPRESSION: Cardiomegaly. There is slight prominence of interstitial markings in the right parahilar region and right lower lung fields, possibly suggesting asymmetric pulmonary edema. Small right pleural effusion. There is interval decrease in the left pleural effusion. Electronically Signed   By: Elmer Picker M.D.   On: 09/14/2021 08:07      Asir Bingley T. [place]  If 7PM-7AM, please contact night-coverage [email or web address] 09/14/2021, 2:02 PM

## 2021-09-14 NOTE — Telephone Encounter (Signed)
Can we please arrange hospital follow-up visit with Dr. [name]?  Thank you.  Liekly discharged in 1 to 2 days.

## 2021-09-15 DIAGNOSIS — I5021 Acute systolic (congestive) heart failure: Secondary | ICD-10-CM | POA: Diagnosis not present

## 2021-09-15 DIAGNOSIS — I480 Paroxysmal atrial fibrillation: Secondary | ICD-10-CM | POA: Diagnosis not present

## 2021-09-15 DIAGNOSIS — D696 Thrombocytopenia, unspecified: Secondary | ICD-10-CM

## 2021-09-15 DIAGNOSIS — N179 Acute kidney failure, unspecified: Secondary | ICD-10-CM | POA: Diagnosis not present

## 2021-09-15 DIAGNOSIS — E8809 Other disorders of plasma-protein metabolism, not elsewhere classified: Secondary | ICD-10-CM

## 2021-09-15 DIAGNOSIS — K2950 Unspecified chronic gastritis without bleeding: Secondary | ICD-10-CM

## 2021-09-15 DIAGNOSIS — K7030 Alcoholic cirrhosis of liver without ascites: Secondary | ICD-10-CM | POA: Diagnosis not present

## 2021-09-15 DIAGNOSIS — Z66 Do not resuscitate: Secondary | ICD-10-CM | POA: Diagnosis not present

## 2021-09-15 DIAGNOSIS — I350 Nonrheumatic aortic (valve) stenosis: Secondary | ICD-10-CM | POA: Diagnosis not present

## 2021-09-15 DIAGNOSIS — I482 Chronic atrial fibrillation, unspecified: Secondary | ICD-10-CM | POA: Diagnosis not present

## 2021-09-15 DIAGNOSIS — K5792 Diverticulitis of intestine, part unspecified, without perforation or abscess without bleeding: Secondary | ICD-10-CM | POA: Diagnosis not present

## 2021-09-15 DIAGNOSIS — K7460 Unspecified cirrhosis of liver: Secondary | ICD-10-CM | POA: Diagnosis present

## 2021-09-15 LAB — RENAL FUNCTION PANEL
Albumin: 2.8 g/dL — ABNORMAL LOW (ref 3.5–5.0)
Anion gap: 7 (ref 5–15)
BUN: 40 mg/dL — ABNORMAL HIGH (ref 8–23)
CO2: 22 mmol/L (ref 22–32)
Calcium: 8.7 mg/dL — ABNORMAL LOW (ref 8.9–10.3)
Chloride: 108 mmol/L (ref 98–111)
Creatinine, Ser: 2.4 mg/dL — ABNORMAL HIGH (ref 0.61–1.24)
GFR, Estimated: 25 mL/min — ABNORMAL LOW (ref >60)
Glucose, Bld: 105 mg/dL — ABNORMAL HIGH (ref 70–99)
Phosphorus: 2.6 mg/dL (ref 2.5–4.6)
Potassium: 4.4 mmol/L (ref 3.5–5.1)
Sodium: 137 mmol/L (ref 135–145)

## 2021-09-15 LAB — CBC
HCT: 37.8 % — ABNORMAL LOW (ref 39.0–52.0)
Hemoglobin: 12.1 g/dL — ABNORMAL LOW (ref 13.0–17.0)
MCH: 32.2 pg (ref 26.0–34.0)
MCHC: 32 g/dL (ref 30.0–36.0)
MCV: 100.5 fL — ABNORMAL HIGH (ref 80.0–100.0)
Platelets: 132 10*3/uL — ABNORMAL LOW (ref 150–400)
RBC: 3.76 MIL/uL — ABNORMAL LOW (ref 4.22–5.81)
RDW: 16 % — ABNORMAL HIGH (ref 11.5–15.5)
WBC: 5.2 10*3/uL (ref 4.0–10.5)
nRBC: 0 % (ref 0.0–0.2)

## 2021-09-15 LAB — MAGNESIUM: Magnesium: 2.5 mg/dL — ABNORMAL HIGH (ref 1.7–2.4)

## 2021-09-15 MED ORDER — FUROSEMIDE 20 MG PO TABS: 20.0000 mg | ORAL_TABLET | Freq: Every day | ORAL | Status: DC | PRN

## 2021-09-15 MED ORDER — ALLOPURINOL 100 MG PO TABS: 50.0000 mg | ORAL_TABLET | Freq: Every day | ORAL | 1 refills | Status: AC

## 2021-09-15 MED ORDER — METOPROLOL SUCCINATE ER 25 MG PO TB24: 25.0000 mg | ORAL_TABLET | Freq: Every day | ORAL | 1 refills | Status: AC

## 2021-09-15 MED ORDER — AMOXICILLIN-POT CLAVULANATE 875-125 MG PO TABS: 1.0000 | ORAL_TABLET | Freq: Every day | ORAL | 0 refills | Status: AC

## 2021-09-15 MED ORDER — COLCHICINE 0.6 MG PO TABS: 0.6000 mg | ORAL_TABLET | Freq: Every day | ORAL | Status: AC | PRN

## 2021-09-15 MED ORDER — APIXABAN 2.5 MG PO TABS: 2.5000 mg | ORAL_TABLET | Freq: Two times a day (BID) | ORAL | Status: DC

## 2021-09-15 MED ORDER — POLYETHYLENE GLYCOL 3350 17 GM/SCOOP PO POWD: 17.0000 g | Freq: Two times a day (BID) | ORAL | 2 refills | Status: AC | PRN

## 2021-09-15 NOTE — Discharge Summary (Signed)
Physician Discharge Summary  Ronald Colon OAC:[id number] DOB: 09-Feb-1929 DOA: 09/10/2021  PCP: [name], MD  Admit date: 09/10/2021 Discharge date: 09/15/2021 Admitted From: Home Disposition: Home Recommendations for Outpatient Follow-up:  Follow ups as below. Recommend palliative follow-up outpatient Please obtain CBC/BMP/Mag at follow up Please follow up on the following pending results: None Home Health: PT/OT Equipment/Devices: Not required Discharge Condition: Stable CODE STATUS: DNR/DNI  Follow-up Information     [facility].   Specialty: [place] Why: [facility] PT will call to schedule your first visit. Contact information: [place] [address] [phone]         [name], MD. Schedule an appointment as soon as possible for a visit in 1 week(s).   Specialty: Family Medicine Contact information: [address] [place] [address] [phone]         Satira Sark, MD .   Specialty: Cardiology Contact information: [place] [address] [phone]                Hospital Course: 88 year old M with PMH of combined CHF, A-fib on Eliquis, GERD, AVMs, HLD and gout presenting with several weeks of worsening abdominal pain with associated nausea, vomiting and shortness of breath, and admitted with working diagnosis of gastritis, acute diverticulitis and AKI.  Patient had no fever or leukocytosis.  BNP elevated to 1800 (unknown baseline).  CT abdomen and pelvis with mild sigmoid diverticulitis and possible hepatic cirrhosis.  Patient was started on IV Zosyn and IV Lasix.  Echocardiogram ordered.  GI consulted.  TTE with LVEF of 20 to 25%, G3 DD, severe LAE and Ronald and RVSP of 49 mmHg.  Cardiology consulted.  Diuretics discontinued due to worsening renal function.  He remained stable from cardiopulmonary standpoint.  Discharged on p.o. Lasix 20 mg as needed and  Toprol-XL 15 mg daily for outpatient follow-up by cardiology.  Patient had EGD on 09/13/2021 that showed  grade 1 esophageal varices, gastric polyp (biopsied and MRI condition clip placed), gastritis (biopsied) and pyloric stenosis which was dilated.  GI symptoms resolved.  Discharged on p.o. Protonix.   Palliative medicine consulted and recommended outpatient follow-up.  See individual problem list below for more on hospital course.  Discharge Diagnoses:  Acute on chronic combined CHF: TTE with LVEF of 20 to 25%, global hypokinesis, G3 DD, severe LAE and Ronald and RVSP of 49.   BNP 1800>> 2000.  Not on diuretics due to AKI.  Intake and output was not captured well. Currently without respiratory distress.  Creatinine remained elevated but stable. -Discharged on p.o. Lasix 20 mg daily as needed and Toprol XL 15 mg daily. -Outpatient follow-up with cardiology in 1 to 2 weeks -Counseled on sodium and fluid restrictions   Epigastric abdominal pain likely due to gastritis-EGD as above.  H&H stable. Recent Labs    08/16/21 1544 09/10/21 1907 09/11/21 0454 09/12/21 0544 09/13/21 0548 09/13/21 1200 09/14/21 0609 09/15/21 0808  HGB 12.4* 13.3 12.3* 12.7* 12.4* 13.0 12.4* 12.1*  -Appreciate GI recs             -Okay to resume Eliquis             -Daily PPI             -KUB prior to MRI given clips. -Recheck CBC in 1 to 2 weeks  Possible liver cirrhosis with esophageal varices: CT concerning for liver cirrhosis.  Mild esophageal varices  on EGD.  No ascites.   Sigmoid diverticulitis?  Abdominal exam benign. No fever or leukocytosis. -IV Zosyn>> IV Unasyn>> p.o. Augmentin to complete total of 7 days course  AKI/azotemia: Cr 1.4 about 4 years ago.  Could be progressive CKD Recent Labs    09/10/21 1907 09/11/21 0454 09/12/21 0544 09/13/21 0548 09/14/21 0609 09/15/21 0808  BUN 45* 45* 41* 41* 40* 40*  CREATININE 2.33* 2.22* 2.15* 2.42* 2.26* 2.40*  -Adjusted medications based on renal  clearance. -Recheck renal function in 1 to 2 weeks   Chronic atrial fibrillation: Rate controlled.  CHA2DS2-VASc score> 3 -Continue home Lopressor, amiodarone and Eliquis   Hyperkalemia: Likely due to AKI.  Resolved.  Prolonged QTc-exaggerated by wide QRS/RBBB -Minimize QT prolonging drugs.   Hyperlipidemia -Continue Lipitor and Zetia  Essential hypertension: Normotensive. -Continue metoprolol  Thrombocytopenia: Could be from liver cirrhosis. -Recheck CBC at follow-up   Physical deconditioning -HH PT/OT   Goal of care counseling: DNR/DNI. -Palliative follow-up outpatient  Body mass index is 24.91 kg/m.           Discharge Exam: Vitals:   09/14/21 1940 09/15/21 0100 09/15/21 0432 09/15/21 0826  BP: 120/83  108/66 115/67  Pulse: 67  68 76  Temp: 97.9 F (36.6 C)  98.4 F (36.9 C)   Resp: 16  15   Height:      Weight:  88 kg    SpO2: 98%  95%   TempSrc: Oral  Oral   BMI (Calculated):  24.9       GENERAL: Frail looking elderly male.  Nontoxic. HEENT: MMM.  Vision and hearing grossly intact.  NECK: Supple.  Some JVD. RESP: 95% on RA.  No IWOB.  Fair aeration bilaterally. CVS:  RRR. Heart sounds normal.  ABD/GI/GU: Bowel sounds present. Soft. Non tender.  MSK/EXT:  Moves extremities.  Significant muscle mass and subcu fat loss. SKIN: no apparent skin lesion or wound NEURO: Awake and alert.  Oriented appropriately.  No apparent focal neuro deficit. PSYCH: Calm. Normal affect.   Discharge Instructions  Discharge Instructions     (HEART FAILURE PATIENTS) Call MD:  Anytime you have any of the following symptoms: 1) 3 pound weight gain in 24 hours or 5 pounds in 1 week 2) shortness of breath, with or without a dry hacking cough 3) swelling in the hands, feet or stomach 4) if you have to sleep on extra pillows at night in order to breathe.   Complete by: As directed    Call MD for:  difficulty breathing, headache or visual disturbances   Complete by: As  directed    Call MD for:  extreme fatigue   Complete by: As directed    Call MD for:  persistant dizziness or light-headedness   Complete by: As directed    Call MD for:  severe uncontrolled pain   Complete by: As directed    Diet - low sodium heart healthy   Complete by: As directed    Discharge instructions   Complete by: As directed    It has been a pleasure taking care of you!  You were hospitalized due to acute heart failure and abdominal pain for which you have been treated.  Your symptoms improved with treatment to the point we think it is safe to let you go home and follow-up with your primary care doctor and cardiologist.  We recommend follow-up with your primary care doctor and cardiologist in 1 to 2 weeks or sooner if needed.  Please  review your new medication list and the directions on your medications before you take them.  In addition to taking your medications as prescribed, we also recommend you avoid alcohol or over-the-counter pain medication other than plain Tylenol, limit the amount of water/fluid you drink to less than 6 cups (1500 cc) a day,  limit your sodium (salt) intake to less than 2 g (2000 mg) a day and weigh yourself daily at the same time and keeping your weight log.     Take care,   Increase activity slowly   Complete by: As directed       Allergies as of 09/15/2021   No Known Allergies      Medication List     STOP taking these medications    alum & mag hydroxide-simeth 200-200-20 MG/5ML suspension Commonly known as: MAALOX/MYLANTA   metoprolol tartrate 25 MG tablet Commonly known as: LOPRESSOR   ondansetron 4 MG tablet Commonly known as: ZOFRAN   sucralfate 1 GM/10ML suspension Commonly known as: CARAFATE       TAKE these medications    acetaminophen 325 MG tablet Commonly known as: TYLENOL Take 650 mg by mouth daily as needed for mild pain or headache.   allopurinol 100 MG tablet Commonly known as: ZYLOPRIM Take 0.5 tablets  (50 mg total) by mouth daily. What changed:  medication strength how much to take   amiodarone 200 MG tablet Commonly known as: PACERONE Take 0.5 tablets (100 mg total) by mouth daily.   amoxicillin-clavulanate 875-125 MG tablet Commonly known as: Augmentin Take 1 tablet by mouth daily for 7 days.   atorvastatin 40 MG tablet Commonly known as: LIPITOR Take 1 tablet (40 mg total) by mouth daily at 6 PM.   colchicine 0.6 MG tablet Take 1 tablet (0.6 mg total) by mouth daily as needed (gout). What changed: when to take this   Eliquis 2.5 MG Tabs tablet Generic drug: apixaban TAKE 1 TABLET BY MOUTH TWICE A DAY   ezetimibe 10 MG tablet Commonly known as: ZETIA Take 10 mg by mouth daily.   fish oil-omega-3 fatty acids 1000 MG capsule Take 1 g by mouth daily.   Folivane-Plus Caps TAKE 1 CAPSULE BY MOUTH EVERY DAY IN THE MORNING What changed: See the new instructions.   furosemide 20 MG tablet Commonly known as: LASIX Take 1 tablet (20 mg total) by mouth daily as needed for edema or fluid (or weight gian over 2 lbs). What changed:  when to take this reasons to take this   metoprolol succinate 25 MG 24 hr tablet Commonly known as: TOPROL-XL Take 1 tablet (25 mg total) by mouth daily.   pantoprazole 40 MG tablet Commonly known as: PROTONIX Take 1 tablet (40 mg total) by mouth 2 (two) times daily. What changed:  how much to take when to take this   polyethylene glycol powder 17 GM/SCOOP powder Commonly known as: MiraLax Take 17 g by mouth 2 (two) times daily as needed for moderate constipation or mild constipation.   prochlorperazine 5 MG tablet Commonly known as: COMPAZINE Take 1 tablet (5 mg total) by mouth every 8 (eight) hours as needed for nausea or vomiting.        Consultations: Cardiology Gastroenterology  Procedures/Studies: - Normal hypopharynx. - Normal proximal esophagus and mid esophagus. - Grade I esophageal varices. - Z-line regular, 45 cm  from the incisors. - A single gastric polyp. Biopsied. Clip (MR conditional) was placed. - Gastritis. Biopsied. - Gastric stenosis was found at the  pylorus. - Normal duodenal bulb and second portion of the duodenum. Moderate Sedation: - Patient has a contact number available for emergencies. The signs and symptoms of potential delayed complications were discussed with the patient. Return to normal activities tomorrow. Written discharge instructions were provided to the patient. - Cardiac diet today. - Continue present medications. - Resume Eliquis (apixaban) at prior dose tomorrow. - Await pathology results. - No MRI until clip has passed  CT ABDOMEN PELVIS WO CONTRAST  Result Date: 09/10/2021 CLINICAL DATA:  Central abdominal pain for 6 months. Nausea and vomiting. EXAM: CT ABDOMEN AND PELVIS WITHOUT CONTRAST TECHNIQUE: Multidetector CT imaging of the abdomen and pelvis was performed following the standard protocol without IV contrast. RADIATION DOSE REDUCTION: This exam was performed according to the departmental dose-optimization program which includes automated exposure control, adjustment of the mA and/or kV according to patient size and/or use of iterative reconstruction technique. COMPARISON:  None. FINDINGS: Lower chest: Small to moderate bilateral pleural effusions are seen with mild compressive atelectasis in the lung bases. Moderate cardiomegaly, without pericardial effusion. Hepatobiliary: No mass visualized on this unenhanced exam. Mild left lobe hypertrophy and capsular nodularity raises suspicion for cirrhosis. Mild ascites in abdomen and pelvis. Gallbladder is unremarkable. No evidence of biliary ductal dilatation. Pancreas: No mass or inflammatory process visualized on this unenhanced exam. Spleen:  Within normal limits in size. Adrenals/Urinary tract: Multiple small less than 1 cm renal calculi are seen bilaterally. No evidence of ureteral calculi or hydronephrosis. Mild diffuse  bladder wall thickening may be due to chronic bladder outlet obstruction or cystitis. Stomach/Bowel: Extensive colonic diverticulosis is noted. A focal area of wall thickening is seen involving the mid sigmoid colon with hazy opacity in the adjacent sigmoid mesocolon, consistent with mild diverticulitis. No evidence of bowel perforation or abscess. Vascular/Lymphatic: No pathologically enlarged lymph nodes identified. No evidence of abdominal aortic aneurysm. Aortic atherosclerotic calcification noted. Reproductive:  No mass or other significant abnormality. Other:  None. Musculoskeletal:  No suspicious bone lesions identified. IMPRESSION: Mild sigmoid diverticulitis. No evidence of bowel perforation or abscess. Bilateral nephrolithiasis. No evidence of ureteral calculi or hydronephrosis. Mild diffuse bladder wall thickening, which may be due to chronic bladder outlet obstruction or cystitis. Mild ascites. Possible hepatic cirrhosis. Recommend correlation with liver function tests and serology. Small to moderate bilateral pleural effusions and bibasilar atelectasis. Electronically Signed   By: Marlaine Hind M.D.   On: 09/10/2021 21:34   DG Chest Port 1 View  Result Date: 09/14/2021 CLINICAL DATA:  Shortness of breath EXAM: PORTABLE CHEST 1 VIEW COMPARISON:  Previous studies including the examination of 09/10/2021 FINDINGS: Transverse diameter of heart is increased. Central pulmonary vessels are prominent. There is improvement in aeration of left lower lung fields. Left lateral CP angle is clear. There is haziness in the right lower lung fields suggesting increase in right pleural effusion. No new focal pulmonary infiltrates are noted. There is slight prominence of interstitial markings in the right parahilar region and right lower lung fields. There is no pneumothorax. IMPRESSION: Cardiomegaly. There is slight prominence of interstitial markings in the right parahilar region and right lower lung fields, possibly  suggesting asymmetric pulmonary edema. Small right pleural effusion. There is interval decrease in the left pleural effusion. Electronically Signed   By: Elmer Picker M.D.   On: 09/14/2021 08:07   DG Chest Portable 1 View  Result Date: 09/10/2021 CLINICAL DATA:  Shortness of breath EXAM: PORTABLE CHEST 1 VIEW COMPARISON:  04/13/2021 FINDINGS: Cardiac shadow is mildly  prominent but stable. Aortic calcifications are seen. Lungs are well aerated bilaterally. Mild atelectasis/scarring is noted in the bases bilaterally. Mild central vascular prominence is noted. Changes of prior granulomatous disease are again seen. IMPRESSION: Stable bibasilar atelectasis/scarring. Prior granulomatous disease. Mild vascular congestion. Electronically Signed   By: Inez Catalina M.D.   On: 09/10/2021 19:36   ECHOCARDIOGRAM COMPLETE  Result Date: 09/11/2021    ECHOCARDIOGRAM REPORT   Patient Name:   Ronald Colon Date of Exam: 09/11/2021 Medical Rec #:  [id number]     Height:       74.0 in Accession #:    [id number]    Weight:       195.0 lb Date of Birth:  1929/01/17     BSA:          2.150 m Patient Age:    74 years      BP:           94/62 mmHg Patient Gender: M             HR:           66 bpm. Exam Location:  [facility] Procedure: 2D Echo, Cardiac Doppler, Color Doppler and Intracardiac            Opacification Agent Indications:    Dyspnea  History:        Patient has prior history of Echocardiogram examinations, most                 recent 08/17/2017. Stroke, Arrythmias:Atrial Fibrillation; Risk                 Factors:Hypertension, Dyslipidemia and Former Smoker.  Sonographer:    Wenda Low Referring Phys: [place] Comments: Technically difficult study due to poor echo windows. IMPRESSIONS  1. Left ventricular ejection fraction, by estimation, is 20 to 25%. The left ventricle has severely decreased function. The left ventricle demonstrates global hypokinesis. The left ventricular internal  cavity size was moderately dilated. There is mild concentric left ventricular hypertrophy. Left ventricular diastolic parameters are consistent with Grade III diastolic dysfunction (restrictive).  2. Right ventricular systolic function is mildly reduced. The right ventricular size is mildly-to-moderately enlarged. There is moderately elevated pulmonary artery systolic pressure. The estimated right ventricular systolic pressure is 02.5 mmHg.  3. Left atrial size was severely dilated.  4. Right atrial size was severely dilated.  5. The mitral valve is abnormal. Mild mitral valve regurgitation. Moderate mitral annular calcification.  6. Tricuspid valve regurgitation is mild to moderate.  7. The aortic valve is tricuspid. There is moderate calcification of the aortic valve. There is moderate thickening of the aortic valve. Aortic valve regurgitation is trivial.  8. Aortic dilatation noted. There is mild dilatation of the aortic root, measuring 38 mm.  9. The inferior vena cava is dilated in size with <50% respiratory variability, suggesting right atrial pressure of 15 mmHg. Comparison(s): Compared to prior TTE in 2019, the LVEF is no severely reduced at 20-25% (previously 45-50%). FINDINGS  Left Ventricle: Left ventricular ejection fraction, by estimation, is 20 to 25%. The left ventricle has severely decreased function. The left ventricle demonstrates global hypokinesis. Definity contrast agent was given IV to delineate the left ventricular endocardial borders. The left ventricular internal cavity size was moderately dilated. There is mild concentric left ventricular hypertrophy. Left ventricular diastolic parameters are consistent with Grade III diastolic dysfunction (restrictive). Right Ventricle: The right ventricular size is mildly-to-moderately enlarged. Right vetricular wall  thickness was not well visualized. Right ventricular systolic function is mildly reduced. There is moderately elevated pulmonary artery  systolic pressure.  The tricuspid regurgitant velocity is 2.90 m/s, and with an assumed right atrial pressure of 15 mmHg, the estimated right ventricular systolic pressure is 75.1 mmHg. Left Atrium: Left atrial size was severely dilated. Right Atrium: Right atrial size was severely dilated. Pericardium: There is no evidence of pericardial effusion. Mitral Valve: The mitral valve is abnormal. There is mild thickening of the mitral valve leaflet(s). There is mild calcification of the mitral valve leaflet(s). Moderate mitral annular calcification. Mild mitral valve regurgitation. MV peak gradient, 5.2  mmHg. The mean mitral valve gradient is 1.0 mmHg. Tricuspid Valve: The tricuspid valve is normal in structure. Tricuspid valve regurgitation is mild to moderate. Aortic Valve: Suspect moderate low flow, low gradient aortic stenosis with AVA 0.91cm2, mean gradient 18mmHg, Vmax 2.28m/s, DI 0.3, SVI 24. The aortic valve is tricuspid. There is moderate calcification of the aortic valve. There is moderate thickening of the aortic valve. Aortic valve regurgitation is trivial. Aortic valve mean gradient measures 13.0 mmHg. Aortic valve peak gradient measures 25.2 mmHg. Aortic valve area, by VTI measures 0.91 cm. Pulmonic Valve: The pulmonic valve was normal in structure. Pulmonic valve regurgitation is mild. Aorta: Aortic dilatation noted. There is mild dilatation of the aortic root, measuring 38 mm. Venous: The inferior vena cava is dilated in size with less than 50% respiratory variability, suggesting right atrial pressure of 15 mmHg. IAS/Shunts: The atrial septum is grossly normal.  LEFT VENTRICLE PLAX 2D LVIDd:         6.10 cm   Diastology LVIDs:         5.40 cm   LV e' medial:    3.60 cm/s LV PW:         1.20 cm   LV E/e' medial:  28.1 LV IVS:        1.40 cm   LV e' lateral:   6.70 cm/s LVOT diam:     2.00 cm   LV E/e' lateral: 15.1 LV SV:         50 LV SV Index:   23 LVOT Area:     3.14 cm  RIGHT VENTRICLE RV Basal  diam:  4.00 cm RV Mid diam:    3.80 cm RV S prime:     6.13 cm/s TAPSE (M-mode): 1.8 cm LEFT ATRIUM              Index        RIGHT ATRIUM           Index LA diam:        4.50 cm  2.09 cm/m   RA Area:     30.80 cm LA Vol (A2C):   116.0 ml 53.95 ml/m  RA Volume:   110.00 ml 51.16 ml/m LA Vol (A4C):   105.0 ml 48.83 ml/m LA Biplane Vol: 113.0 ml 52.55 ml/m  AORTIC VALVE                     PULMONIC VALVE AV Area (Vmax):    0.94 cm      PV Vmax:       0.54 m/s AV Area (Vmean):   0.89 cm      PV Peak grad:  1.2 mmHg AV Area (VTI):     0.91 cm AV Vmax:           251.00 cm/s AV Vmean:  168.000 cm/s AV VTI:            0.547 m AV Peak Grad:      25.2 mmHg AV Mean Grad:      13.0 mmHg LVOT Vmax:         75.10 cm/s LVOT Vmean:        47.800 cm/s LVOT VTI:          0.158 m LVOT/AV VTI ratio: 0.29  AORTA Ao Root diam: 3.80 cm Ao Asc diam:  3.20 cm MITRAL VALVE                TRICUSPID VALVE MV Area (PHT): 4.89 cm     TR Peak grad:   33.6 mmHg MV Area VTI:   2.20 cm     TR Vmax:        290.00 cm/s MV Peak grad:  5.2 mmHg MV Mean grad:  1.0 mmHg     SHUNTS MV Vmax:       1.14 m/s     Systemic VTI:  0.16 m MV Vmean:      54.2 cm/s    Systemic Diam: 2.00 cm MV Decel Time: 155 msec MV E velocity: 101.00 cm/s MV A velocity: 31.30 cm/s MV E/A ratio:  3.23 Gwyndolyn Kaufman MD Electronically signed by Gwyndolyn Kaufman MD Signature Date/Time: 09/11/2021/4:45:31 PM    Final        The results of significant diagnostics from this hospitalization (including imaging, microbiology, ancillary and laboratory) are listed below for reference.     Microbiology: Recent Results (from the past 240 hour(s))  Resp Panel by RT-PCR (Flu A&B, Covid) Nasopharyngeal Swab     Status: None   Collection Time: 09/10/21 11:09 PM   Specimen: Nasopharyngeal Swab; Nasopharyngeal(NP) swabs in vial transport medium  Result Value Ref Range Status   SARS Coronavirus 2 by RT PCR NEGATIVE NEGATIVE Final    Comment: (NOTE) SARS-CoV-2  target nucleic acids are NOT DETECTED.  The SARS-CoV-2 RNA is generally detectable in upper respiratory specimens during the acute phase of infection. The lowest concentration of SARS-CoV-2 viral copies this assay can detect is 138 copies/mL. A negative result does not preclude SARS-Cov-2 infection and should not be used as the sole basis for treatment or other patient management decisions. A negative result may occur with  improper specimen collection/handling, submission of specimen other than nasopharyngeal swab, presence of viral mutation(s) within the areas targeted by this assay, and inadequate number of viral copies(<138 copies/mL). A negative result must be combined with clinical observations, patient history, and epidemiological information. The expected result is Negative.  Fact Sheet for Patients:  EntrepreneurPulse.com.au  Fact Sheet for Healthcare Providers:  IncredibleEmployment.be  This test is no t yet approved or cleared by the [place] FDA and  has been authorized for detection and/or diagnosis of SARS-CoV-2 by FDA under an Emergency Use Authorization (EUA). This EUA will remain  in effect (meaning this test can be used) for the duration of the COVID-19 declaration under Section 564(b)(1) of the Act, 21 U.S.C.section 360bbb-3(b)(1), unless the authorization is terminated  or revoked sooner.       Influenza A by PCR NEGATIVE NEGATIVE Final   Influenza B by PCR NEGATIVE NEGATIVE Final    Comment: (NOTE) The Xpert Xpress SARS-CoV-2/FLU/RSV plus assay is intended as an aid in the diagnosis of influenza from Nasopharyngeal swab specimens and should not be used as a sole basis for treatment. Nasal washings and aspirates are unacceptable for Xpert  Xpress SARS-CoV-2/FLU/RSV testing.  Fact Sheet for Patients: EntrepreneurPulse.com.au  Fact Sheet for Healthcare  Providers: IncredibleEmployment.be  This test is not yet approved or cleared by the [place] FDA and has been authorized for detection and/or diagnosis of SARS-CoV-2 by FDA under an Emergency Use Authorization (EUA). This EUA will remain in effect (meaning this test can be used) for the duration of the COVID-19 declaration under Section 564(b)(1) of the Act, 21 U.S.C. section 360bbb-3(b)(1), unless the authorization is terminated or revoked.  Performed at [facility], [address], [place], [place] [address]      Labs:  CBC: Recent Labs  Lab 09/10/21 1907 09/11/21 0454 09/12/21 0544 09/13/21 0548 09/13/21 1200 09/14/21 0609 09/15/21 0808  WBC 6.2   < > 6.2 6.1 5.9 5.6 5.2  NEUTROABS 4.6  --   --   --  4.3  --   --   HGB 13.3   < > 12.7* 12.4* 13.0 12.4* 12.1*  HCT 41.4   < > 39.0 39.4 40.1 38.5* 37.8*  MCV 102.2*   < > 103.4* 102.6* 104.2* 100.5* 100.5*  PLT 140*   < > 144* 121* 161 127* 132*   < > = values in this interval not displayed.   BMP &GFR Recent Labs  Lab 09/11/21 0454 09/12/21 0544 09/13/21 0548 09/14/21 0609 09/15/21 0808  NA 140 138 136 138 137  K 4.0 5.2* 4.2 4.3 4.4  CL 110 108 106 106 108  CO2 24 22 24 22 22   GLUCOSE 105* 98 105* 101* 105*  BUN 45* 41* 41* 40* 40*  CREATININE 2.22* 2.15* 2.42* 2.26* 2.40*  CALCIUM 8.8* 8.9 8.6* 8.9 8.7*  MG 2.5*  --   --  2.4 2.5*  PHOS 4.1  --   --  2.8 2.6   Estimated Creatinine Clearance: 23.8 mL/min (A) (by C-G formula based on SCr of 2.4 mg/dL (H)). Liver & Pancreas: Recent Labs  Lab 09/10/21 1907 09/11/21 0454 09/12/21 0544 09/14/21 0609 09/15/21 0808  AST 42* 36 34  --   --   ALT 28 25 23   --   --   ALKPHOS 84 73 67  --   --   BILITOT 1.5* 1.3* 1.2  --   --   PROT 6.7 6.0* 5.9*  --   --   ALBUMIN 3.4* 3.0* 2.9* 2.9* 2.8*   Recent Labs  Lab 09/10/21 1907  LIPASE 49   No results for input(s): AMMONIA in the last 168 hours. Diabetic: No results for input(s):  HGBA1C in the last 72 hours. No results for input(s): GLUCAP in the last 168 hours. Cardiac Enzymes: No results for input(s): CKTOTAL, CKMB, CKMBINDEX, TROPONINI in the last 168 hours. No results for input(s): PROBNP in the last 8760 hours. Coagulation Profile: No results for input(s): INR, PROTIME in the last 168 hours. Thyroid Function Tests: No results for input(s): TSH, T4TOTAL, FREET4, T3FREE, THYROIDAB in the last 72 hours. Lipid Profile: No results for input(s): CHOL, HDL, LDLCALC, TRIG, CHOLHDL, LDLDIRECT in the last 72 hours. Anemia Panel: No results for input(s): VITAMINB12, FOLATE, FERRITIN, TIBC, IRON, RETICCTPCT in the last 72 hours. Urine analysis:    Component Value Date/Time   COLORURINE YELLOW 09/10/2021 2103   APPEARANCEUR CLEAR 09/10/2021 2103   LABSPEC 1.020 09/10/2021 2103   PHURINE 5.5 09/10/2021 2103   GLUCOSEU NEGATIVE 09/10/2021 2103   HGBUR NEGATIVE 09/10/2021 2103   BILIRUBINUR NEGATIVE 09/10/2021 2103   KETONESUR NEGATIVE 09/10/2021 2103   PROTEINUR NEGATIVE  09/10/2021 2103   NITRITE NEGATIVE 09/10/2021 2103   LEUKOCYTESUR NEGATIVE 09/10/2021 2103   Sepsis Labs: Invalid input(s): PROCALCITONIN, LACTICIDVEN   Time coordinating discharge: 55 minutes  SIGNED:  Mercy Riding, MD  [facility] 09/15/2021, 7:51 PM

## 2021-09-15 NOTE — Progress Notes (Addendum)
Progress Note  Patient Name: Ronald Colon Date of Encounter: 09/15/2021  [facility] Cardiologist: [name], MD   [place] met with the patient and family yesterday and recommended to continue to treat the treatable but made DNR and will follow-up with outpatient palliative services.   In talking with the patient today, he reports feeling "great" and wants to go home. Breathing at baseline. No chest pain or palpitations. Abdominal pain significantly improved.   Inpatient Medications    Scheduled Meds:  amiodarone  100 mg Oral Daily   atorvastatin  40 mg Oral q1800   ezetimibe  10 mg Oral Daily   feeding supplement  237 mL Oral BID BM   metoprolol succinate  12.5 mg Oral Daily   pantoprazole  40 mg Oral Daily   polyethylene glycol  17 g Oral Daily   sucralfate  1 g Oral TID WC & HS   Continuous Infusions:  ampicillin-sulbactam (UNASYN) IV 3 g (09/15/21 0207)   PRN Meds: alum & mag hydroxide-simeth, morphine injection, prochlorperazine   Vital Signs    Vitals:   09/14/21 1940 09/15/21 0100 09/15/21 0432 09/15/21 0826  BP: 120/83  108/66 115/67  Pulse: 67  68 76  Resp: 16  15   Temp: 97.9 F (36.6 C)  98.4 F (36.9 C)   TempSrc: Oral  Oral   SpO2: 98%  95%   Weight:  88 kg    Height:        Intake/Output Summary (Last 24 hours) at 09/15/2021 1118 Last data filed at 09/15/2021 0500 Gross per 24 hour  Intake 780 ml  Output 300 ml  Net 480 ml   Last 3 Weights 09/15/2021 09/14/2021 09/10/2021  Weight (lbs) 194 lb 0.1 oz 193 lb 2 oz 195 lb  Weight (kg) 88 kg 87.6 kg 88.451 kg      Telemetry    Atrial fibrillation, HR in 70's.  - Personally Reviewed  ECG    No new tracings.   Physical Exam   GEN: Pleasant elderly male appearing in no acute distress.   Neck: No JVD Cardiac: Irregularly irregular. 2/6 SEM along RUSB.  Respiratory: Clear to auscultation bilaterally without wheezing or rales. GI: Soft, nontender, non-distended  MS:  No pitting edema; No deformity. Neuro:  Nonfocal  Psych: Normal affect   Labs    High Sensitivity Troponin:   Recent Labs  Lab 09/10/21 1907 09/10/21 2114  TROPONINIHS 16 16     Chemistry Recent Labs  Lab 09/10/21 1907 09/11/21 0454 09/12/21 0544 09/13/21 0548 09/14/21 0609 09/15/21 0808  NA 137 140 138 136 138 137  K 4.3 4.0 5.2* 4.2 4.3 4.4  CL 108 110 108 106 106 108  CO2 _0 GLUCOSE 117* 105* 98 105* 101* 105*  BUN 45* 45* 41* 41* 40* 40*  CREATININE 2.33* 2.22* 2.15* 2.42* 2.26* 2.40*  CALCIUM 9.0 8.8* 8.9 8.6* 8.9 8.7*  MG  --  2.5*  --   --  2.4 2.5*  PROT 6.7 6.0* 5.9*  --   --   --   ALBUMIN 3.4* 3.0* 2.9*  --  2.9* 2.8*  AST 42* 36 34  --   --   --   ALT _1 --   --   --   ALKPHOS 84 73 67  --   --   --   BILITOT 1.5* 1.3* 1.2  --   --   --  GFRNONAA 26* 27* 29* 25* 27* 25*  ANIONGAP _0 Lipids No results for input(s): CHOL, TRIG, HDL, LABVLDL, LDLCALC, CHOLHDL in the last 168 hours.  Hematology Recent Labs  Lab 09/13/21 1200 09/14/21 0609 09/15/21 0808  WBC 5.9 5.6 5.2  RBC 3.85* 3.83* 3.76*  HGB 13.0 12.4* 12.1*  HCT 40.1 38.5* 37.8*  MCV 104.2* 100.5* 100.5*  MCH 33.8 32.4 32.2  MCHC 32.4 32.2 32.0  RDW 16.4* 16.0* 16.0*  PLT 161 127* 132*   Thyroid No results for input(s): TSH, FREET4 in the last 168 hours.  BNP Recent Labs  Lab 09/12/21 0544 09/14/21 0609  BNP 1,793.0* 2,122.0*    DDimer No results for input(s): DDIMER in the last 168 hours.   Radiology    DG Chest Port 1 View  Result Date: 09/14/2021 CLINICAL DATA:  Shortness of breath EXAM: PORTABLE CHEST 1 VIEW COMPARISON:  Previous studies including the examination of 09/10/2021 FINDINGS: Transverse diameter of heart is increased. Central pulmonary vessels are prominent. There is improvement in aeration of left lower lung fields. Left lateral CP angle is clear. There is haziness in the right lower lung fields suggesting increase in right pleural  effusion. No new focal pulmonary infiltrates are noted. There is slight prominence of interstitial markings in the right parahilar region and right lower lung fields. There is no pneumothorax. IMPRESSION: Cardiomegaly. There is slight prominence of interstitial markings in the right parahilar region and right lower lung fields, possibly suggesting asymmetric pulmonary edema. Small right pleural effusion. There is interval decrease in the left pleural effusion. Electronically Signed   By: Elmer Picker M.D.   On: 09/14/2021 08:07    Cardiac Studies   Echocardiogram: 09/11/2021 IMPRESSIONS     1. Left ventricular ejection fraction, by estimation, is 20 to 25%. The  left ventricle has severely decreased function. The left ventricle  demonstrates global hypokinesis. The left ventricular internal cavity size  was moderately dilated. There is mild  concentric left ventricular hypertrophy. Left ventricular diastolic  parameters are consistent with Grade III diastolic dysfunction  (restrictive).   2. Right ventricular systolic function is mildly reduced. The right  ventricular size is mildly-to-moderately enlarged. There is moderately  elevated pulmonary artery systolic pressure. The estimated right  ventricular systolic pressure is 77.9 mmHg.   3. Left atrial size was severely dilated.   4. Right atrial size was severely dilated.   5. The mitral valve is abnormal. Mild mitral valve regurgitation.  Moderate mitral annular calcification.   6. Tricuspid valve regurgitation is mild to moderate.   7. The aortic valve is tricuspid. There is moderate calcification of the  aortic valve. There is moderate thickening of the aortic valve. Aortic  valve regurgitation is trivial.   8. Aortic dilatation noted. There is mild dilatation of the aortic root,  measuring 38 mm.   9. The inferior vena cava is dilated in size with <50% respiratory  variability, suggesting right atrial pressure of 15 mmHg.    Patient Profile     88 y.o. 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 Cardiology consulted due to progressive cardiomyopathy with EF at 20-25% this admission.   Assessment & Plan    1. Acute HFrEF - EF was previously at 45-50% in 2019, down to 20-25% by repeat echo  this admission with mildly reduced RV function. BNP was at 793 and he initially received IV Lasix but developed an AKI.  - GDMT has been limited secondary to his AKI and soft BP. Currently on Toprol-XL 12.14m daily. Was on Lasix 289mdaily prior to admission and would likely resume at discharge.   2. Aortic Stenosis - Felt to be underestimated in the setting of his reduced EF and likely moderate to severe. No plans for aggressive testing or work-up at this time.   3. Paroxysmal Atrial Fibrillation - Likely permanent as he has been in atrial fibrillation during office visits and has been in atrial fibrillation this admission. Remains on Toprol-XL and low-dose Amiodarone 10020maily.  - GI cleared him to resume Eliquis. Will order at 2.5mg73mD which is the appropriate dose at this time given his age of 89 a59 creatinine of 2.40.  4. AKI - Creatinine peaked at 2.42 on 09/13/2021, down to 2.26 yesterday but trending back up to 2.40 today.   5. Gastritis/Diverticulitis - EGD this admission showed prepyloric gastritis with pyloric stenosis. Also noted to have Grade 1 esophageal varices. Remains on antibiotic therapy and by review of notes has been cleared to resume Eliquis per GI.   For questions or updates, please contact [facility] consult [email or web address] for contact info under      Signed, BritErma Heritage-C  09/15/2021, 11:18 AM    Patient seen and examined   I agree with findings of B [place] above  Pt comfortable in chair   Eager to go  home   Feels "great" Neck:  No JVD  Lungs aer CTA  Cardiac   irreg irreg   II/VI systolic murmur LSB    Ext   No edema  Pt with Afib, AS (probable mod/severe) and HFrEF     Volume status is OK now   Note bump in creatinine    Watch wights and salt at home       OK to d/c    Will arrange f/u      PaulDorris Carnes /

## 2021-09-15 NOTE — TOC Transition Note (Signed)
Transition of Care [facility]) - CM/SW Discharge Note   Patient Details  Name: Ronald Colon MRN: [id number] Date of Birth: [birth year]  [facility] CM/SW Contact:  Boneta Lucks, RN Phone Number: 09/15/2021, 9:24 AM   Clinical Narrative:   Discharging home with home health. [facility] updated orders placed.   Final next level of care: [place] Barriers to Discharge: Barriers Resolved   Patient Goals and CMS Choice Patient states their goals for this hospitalization and ongoing recovery are:: to go home CMS Medicare.gov Compare Post Acute Care list provided to:: Patient Choice offered to / list presented to : Patient  Discharge Placement                Patient to be transferred to facility by: family   Patient and family notified of of transfer: 09/15/21  Discharge Plan and Services                DME Agency: [place] Date DME Agency Contacted: 09/13/21 Time DME Agency Contacted: [id number] Representative spoke with at DME Agency: [place] Arranged: PT      Readmission Risk Interventions Readmission Risk Prevention Plan 09/15/2021  Transportation Screening Complete  PCP or Specialist Appt within 5-7 Days Complete  Home Care Screening Complete  Medication Review (RN CM) Complete  Some recent data might be hidden

## 2021-09-18 ENCOUNTER — Ambulatory Visit (HOSPITAL_COMMUNITY): Payer: Medicare Other

## 2021-09-18 ENCOUNTER — Telehealth (INDEPENDENT_AMBULATORY_CARE_PROVIDER_SITE_OTHER): Payer: Self-pay | Admitting: *Deleted

## 2021-09-18 NOTE — Telephone Encounter (Signed)
Pt released from hospital on 2/3. Taking augmentin once daily for 7 days in the evenings for sigmoid diverticulitis.  States it is making his stomach burn for about 2-3 hours afterwards. He is not taking it with food. I advised him to take with food. He is also asking for carafate. It was on meds stopped on hospital discharge but he states he was not told why he could not take med.

## 2021-09-18 NOTE — Telephone Encounter (Signed)
Called and discussed with pt per chelsea -  looks like I saw him a while back, we stopped his carafate because he said it was causing issues with his sleep. The antibiotic he is on can be hard on the stomach, I would recommend he take it with food and take a probiotic while he is on this, I like culturelle daily digestive, which can be found over the counter,  just make sure to take it atleast 2 hours after the antibiotic.    Pt verbalized understanding of all and will take antibiotic with food and try probiotic.   He still wanted to try the carafate again even though it caused issues with his sleep.

## 2021-09-19 ENCOUNTER — Other Ambulatory Visit (INDEPENDENT_AMBULATORY_CARE_PROVIDER_SITE_OTHER): Payer: Self-pay | Admitting: *Deleted

## 2021-09-19 MED ORDER — SUCRALFATE 1 G PO TABS: ORAL_TABLET | ORAL | 0 refills | Status: DC

## 2021-09-19 NOTE — Telephone Encounter (Signed)
Med sent to pharm and called and discussed with pt. Pt verbalized understanding.

## 2021-09-21 DIAGNOSIS — K5732 Diverticulitis of large intestine without perforation or abscess without bleeding: Secondary | ICD-10-CM | POA: Diagnosis not present

## 2021-09-21 DIAGNOSIS — E875 Hyperkalemia: Secondary | ICD-10-CM | POA: Diagnosis not present

## 2021-09-21 DIAGNOSIS — I4819 Other persistent atrial fibrillation: Secondary | ICD-10-CM | POA: Diagnosis not present

## 2021-09-21 DIAGNOSIS — I5023 Acute on chronic systolic (congestive) heart failure: Secondary | ICD-10-CM | POA: Diagnosis not present

## 2021-09-21 DIAGNOSIS — R932 Abnormal findings on diagnostic imaging of liver and biliary tract: Secondary | ICD-10-CM | POA: Diagnosis not present

## 2021-09-21 DIAGNOSIS — N189 Chronic kidney disease, unspecified: Secondary | ICD-10-CM | POA: Diagnosis not present

## 2021-09-21 DIAGNOSIS — R531 Weakness: Secondary | ICD-10-CM | POA: Diagnosis not present

## 2021-09-25 ENCOUNTER — Other Ambulatory Visit: Payer: Self-pay

## 2021-09-25 ENCOUNTER — Encounter (INDEPENDENT_AMBULATORY_CARE_PROVIDER_SITE_OTHER): Payer: Self-pay | Admitting: Gastroenterology

## 2021-09-25 ENCOUNTER — Ambulatory Visit (INDEPENDENT_AMBULATORY_CARE_PROVIDER_SITE_OTHER): Payer: Medicare Other | Admitting: Gastroenterology

## 2021-09-25 VITALS — BP 111/71 | HR 76 | Temp 97.5°F | Ht 74.0 in

## 2021-09-25 DIAGNOSIS — I8500 Esophageal varices without bleeding: Secondary | ICD-10-CM | POA: Insufficient documentation

## 2021-09-25 DIAGNOSIS — K7460 Unspecified cirrhosis of liver: Secondary | ICD-10-CM | POA: Diagnosis not present

## 2021-09-25 DIAGNOSIS — G8929 Other chronic pain: Secondary | ICD-10-CM

## 2021-09-25 DIAGNOSIS — K311 Adult hypertrophic pyloric stenosis: Secondary | ICD-10-CM | POA: Diagnosis not present

## 2021-09-25 DIAGNOSIS — I8510 Secondary esophageal varices without bleeding: Secondary | ICD-10-CM

## 2021-09-25 DIAGNOSIS — R1013 Epigastric pain: Secondary | ICD-10-CM | POA: Diagnosis not present

## 2021-09-25 DIAGNOSIS — K55059 Acute (reversible) ischemia of intestine, part and extent unspecified: Secondary | ICD-10-CM | POA: Diagnosis not present

## 2021-09-25 MED ORDER — SUCRALFATE 1 G PO TABS: 1.0000 g | ORAL_TABLET | Freq: Three times a day (TID) | ORAL | 0 refills | Status: AC

## 2021-09-25 NOTE — Progress Notes (Signed)
[name], M.D. Gastroenterology & Hepatology [facility] [address] [place], [place] [address]  Primary Care Physician: [name], MD [address] [place] [address]  I will communicate my assessment and recommendations to the referring MD via EMR.  Problems: Chronic epigastric abdominal pain History of small bowel AVMs Liver cirrhosis Esophageal varices -grade 1  History of Present Illness: Ronald Colon is a 88 y.o. male with past medical history of liver cirrhosis mild aortic stenosis, atrial fibrillation, history of AVMs in his small bowel, , hypertension, gout, hyperlipidemia, stroke and rheumatic fever, who presents for follow up of chronic epigastric abdominal pain and iron deficiency anemia.  The patient was last seen on 08/16/2021. At that time, the patient was complaining of abdominal pain and he was advised to undergo a CT angio of the abdomen and pelvis with IV contrast.  He was also advised to follow-up with cardiology to obtain clearance to undergo esophagogastroduodenospy if his testing was negative.  Iron stores were checked which showed an iron of 41, saturation of 12% and TIBC of 344 with a ferritin of 76.  He was advised to continue on his oral iron as he had improvement of his hemoglobin.  Most recent hemoglobin on 09/15/2021 was 12.1 with MCV of 100.5.  Patient was seen by cardiology on 09/05/2021.  At that time he was complaining of shortness of breath which was initially attributed to possible deconditioning.  Notably, the patient was admitted to the hospital on 09/10/2021 after he presented an episode of uncomplicated acute diverticulitis for which he was given IV antibiotics with adequate response. A TTE was ordered prior to obtaining clearance for esophagogastroduodenospy.  He was found to have worsening heart failure with an ejection fraction between 20 to 25% and increased pulmonary hypertension.   Patient ultimately underwent an EGD by Dr. Laural Golden while hospitalized for evaluation of abdominal pain after obtaining clearance, he was found to have grade 1 esophageal varices, a single gastric polyp was found (biopsies showed hyperplastic polyp with rest of gastric biopsies within normal limits) which was clipped after it was biopsied, there was gastritis, and the stomach had presence of mild stenosis which was easily transversed, normal duodenum.  Patient did not proceed with the CT angio that was scheduled in the past due to kidney disease.   Patient comes with family.  States that he is still having persistent pain in his upper abdomen.  Does not know what to eat to avoid having the pain.  States that in the morning a few hours after eating he has persistent pain in the same area.  Reports that regarding his diverticulitis he is doing better as he has not present any more left lower quadrant abdominal pain.  Sometimes he feels when he eats regular pieces of food, he has nausea and sometimes vomits.  Has chronic shiortness of breath. Does not have any appointment with cardiology at the moment.  He feels the carafate that was prescribed in the hospital helped relieve the pain.  Has had recurrent pain in his upper abdomen for the last 6 months.  Patient was found to have possible hepatic cirrhosis on CT of the abdomen which was new.  He also had evidence of varices with more recent endoscopy.  The patient denies having any nausea, vomiting, fever, chills, hematochezia, melena, hematemesis, abdominal distention,, diarrhea, jaundice, pruritus or weight loss.  Last EGD: As above Last Colonoscopy: 2018 - The examined portion of the ileum  was normal. - One 8 mm polyp in the cecum, removed with a cold snare. Resected and retrieved. - One 3 mm polyp in the ascending colon, removed with a cold biopsy forceps. Resected and retrieved. - Five 4 to 7 mm polyps in the ascending colon, removed with a cold  snare. Resected and retrieved. - A single colonic angiodysplastic lesion. Treated with argon beam coagulation. - Three 4 to 7 mm polyps in the transverse colon, removed with a cold snare. Resected and retrieved. - One 8 mm polyp in the descending colon, removed with a cold snare. Resected and retrieved. - Mild diverticulosis in the sigmoid colon, in the descending colon and in the transverse colon. There was narrowing of the colon in association with the diverticular opening. - Internal hemorrhoids.  Past Medical History: Past Medical History:  Diagnosis Date   Aortic stenosis    Mild February 2018   Arthritis    Atrial fibrillation [facility])    Diagnosed January 2018   AVM (arteriovenous malformation)    Carotid stenosis    Diverticulosis    Essential hypertension    Gout    Hx of adenomatous colonic polyps    Hyperlipidemia    IDA (iron deficiency anemia)    Internal hemorrhoids    Rheumatic fever 1937   Stroke ([place]) 09/10/2016   Tubular adenoma of colon     Past Surgical History: Past Surgical History:  Procedure Laterality Date   BIOPSY  09/13/2021   Procedure: BIOPSY;  Surgeon: Rogene Houston, MD;  [facility];  Service: Endoscopy;;   CATARACT EXTRACTION W/PHACO Right 08/02/2014   Procedure: CATARACT EXTRACTION PHACO AND INTRAOCULAR LENS PLACEMENT; CDE:  12.77;  Surgeon: Williams Che, MD;  Location: [facility];  Service: Ophthalmology;  Laterality: Right;   CATARACT EXTRACTION W/PHACO Left 02/15/2015   Procedure: CATARACT EXTRACTION PHACO AND INTRAOCULAR LENS PLACEMENT (IOC);  Surgeon: Williams Che, MD;  Location: [facility];  Service: Ophthalmology;  Laterality: Left;  CDE 18.00   COLONOSCOPY     COLONOSCOPY WITH PROPOFOL N/A 12/13/2016   ileum normal, polyp in cecum, ascending colon-multiple, transverse colon, diverticulosis in sigmoid descending and transverse, narrowing of colon in assoc with diverticular opening, internal hemorrhoids   ELBOW SURGERY  Right    "grissle in there"   ENDARTERECTOMY Left 09/17/2016   Procedure: LEFT CAROTID ENDARTECTOMY;  Surgeon: Waynetta Sandy, MD;  Location: [place];  Service: Vascular;  Laterality: Left;   ESOPHAGEAL DILATION  09/03/2013   Procedure: ESOPHAGEAL DILATION;  Surgeon: Rogene Houston, MD;  [facility];  Service: Endoscopy;;   ESOPHAGOGASTRODUODENOSCOPY N/A 09/03/2013   Procedure: ESOPHAGOGASTRODUODENOSCOPY (EGD);  Surgeon: Rogene Houston, MD;  [facility];  Service: Endoscopy;  Laterality: N/A;  255-rescheduled to 1030 Ann to notify pt   ESOPHAGOGASTRODUODENOSCOPY (EGD) WITH PROPOFOL N/A 12/13/2016   normal, no specimens   ESOPHAGOGASTRODUODENOSCOPY (EGD) WITH PROPOFOL N/A 09/13/2021   Procedure: ESOPHAGOGASTRODUODENOSCOPY (EGD) WITH PROPOFOL;  Surgeon: Rogene Houston, MD;  [facility];  Service: Endoscopy;  Laterality: N/A;   GIVENS CAPSULE STUDY N/A 08/17/2017   AVMs   PATCH ANGIOPLASTY Left 09/17/2016   Procedure: PATCH ANGIOPLASTY WITH Rueben Bash BIOLOGIC PATCH 1 CM X6 CM;  Surgeon: Waynetta Sandy, MD;  Location: [facility] OR;  Service: Vascular;  Laterality: Left;   POLYPECTOMY  09/13/2021   Procedure: POLYPECTOMY;  Surgeon: Rogene Houston, MD;  [facility];  Service: Endoscopy;;  used bx forcep  Family History: Family History  Problem Relation Age of Onset   CVA Mother 45   Pneumonia Father 46   Colon cancer Neg Hx     Social History: Social History   Tobacco Use  Smoking Status Former   Packs/day: 1.00   Years: 20.00   Pack years: 20.00   Types: Cigarettes   Quit date: 1980   Years since quitting: 43.1  Smokeless Tobacco Never   Social History   Substance and Sexual Activity  Alcohol Use No   Social History   Substance and Sexual Activity  Drug Use No    Allergies: No Known Allergies  Medications: Current Outpatient Medications  Medication Sig Dispense Refill   acetaminophen (TYLENOL) 325 MG  tablet Take 650 mg by mouth daily as needed for mild pain or headache.      allopurinol (ZYLOPRIM) 100 MG tablet Take 0.5 tablets (50 mg total) by mouth daily. 30 tablet 1   amiodarone (PACERONE) 200 MG tablet Take 0.5 tablets (100 mg total) by mouth daily. 45 tablet 3   apixaban (ELIQUIS) 2.5 MG TABS tablet TAKE 1 TABLET BY MOUTH TWICE A DAY 180 tablet 0   atorvastatin (LIPITOR) 40 MG tablet Take 1 tablet (40 mg total) by mouth daily at 6 PM. (Patient taking differently: Take 40 mg by mouth daily.) 30 tablet 11   ezetimibe (ZETIA) 10 MG tablet Take 10 mg by mouth daily.     FeFum-FePoly-FA-B Cmp-C-Biot (FOLIVANE-PLUS) CAPS TAKE 1 CAPSULE BY MOUTH EVERY DAY IN THE MORNING (Patient taking differently: Take 1 capsule by mouth daily.) 90 capsule 0   fish oil-omega-3 fatty acids 1000 MG capsule Take 1 g by mouth daily.     metoprolol succinate (TOPROL-XL) 25 MG 24 hr tablet Take 1 tablet (25 mg total) by mouth daily. 90 tablet 1   pantoprazole (PROTONIX) 40 MG tablet Take 1 tablet (40 mg total) by mouth 2 (two) times daily. (Patient taking differently: Take by mouth daily.) 180 tablet 3   polyethylene glycol powder (MIRALAX) 17 GM/SCOOP powder Take 17 g by mouth 2 (two) times daily as needed for moderate constipation or mild constipation. 255 g 2   sucralfate (CARAFATE) 1 g tablet Take one tid with meals. 42 tablet 0   colchicine 0.6 MG tablet Take 1 tablet (0.6 mg total) by mouth daily as needed (gout). (Patient not taking: Reported on 09/25/2021)     furosemide (LASIX) 20 MG tablet Take 1 tablet (20 mg total) by mouth daily as needed for edema or fluid (or weight gian over 2 lbs). (Patient not taking: Reported on 09/25/2021) 30 tablet    prochlorperazine (COMPAZINE) 5 MG tablet Take 1 tablet (5 mg total) by mouth every 8 (eight) hours as needed for nausea or vomiting. (Patient not taking: Reported on 09/25/2021) 30 tablet 2   No current facility-administered medications for this visit.    Review of  Systems: GENERAL: negative for malaise, night sweats HEENT: No changes in hearing or vision, no nose bleeds or other nasal problems. NECK: Negative for lumps, goiter, pain and significant neck swelling RESPIRATORY: Negative for cough, wheezing CARDIOVASCULAR: Negative for chest pain, leg swelling, palpitations, orthopnea GI: SEE HPI MUSCULOSKELETAL: Negative for joint pain or swelling, back pain, and muscle pain. SKIN: Negative for lesions, rash PSYCH: Negative for sleep disturbance, mood disorder and recent psychosocial stressors. HEMATOLOGY Negative for prolonged bleeding, bruising easily, and swollen nodes. ENDOCRINE: Negative for cold or heat intolerance, polyuria, polydipsia and goiter. NEURO: negative for tremor, gait  imbalance, syncope and seizures. The remainder of the review of systems is noncontributory.   Physical Exam: BP 111/71 (BP Location: Left Arm, Patient Position: Sitting, Cuff Size: Large)    Pulse 76    Temp (!) 97.5 F (36.4 C) (Oral)    Ht 6\' 2"  (1.88 m)    BMI 24.91 kg/m  GENERAL: The patient is AO x3, in no acute distress. Sitting in wheelchair. HEENT: Head is normocephalic and atraumatic. EOMI are intact. Mouth is well hydrated and without lesions. NECK: Supple. No masses LUNGS: Clear to auscultation. No presence of rhonchi/wheezing/rales. Adequate chest expansion HEART: RRR, normal s1 and s2. ABDOMEN: tender to palpation in the epigastric area, no guarding, no peritoneal signs, and nondistended. BS +. No masses. EXTREMITIES: Without any cyanosis, clubbing, rash, lesions or edema. NEUROLOGIC: AOx3, no focal motor deficit. SKIN: no jaundice, no rashes  Imaging/Labs: as above  I personally reviewed and interpreted the available labs, imaging and endoscopic files.  Impression and Plan: Ronald Colon is a 88 y.o. male with past medical history of liver cirrhosis, mild aortic stenosis, atrial fibrillation, history of AVMs in his small bowel, , hypertension,  gout, hyperlipidemia, stroke and rheumatic fever, who presents for follow up of chronic epigastric abdominal pain and iron deficiency anemia.  The patient has presented persistent abdominal pain of unclear etiology.  Unfortunately, given his CKD he was not a candidate to undergo a CT study with IV contrast.  Given the fact that he has persistent with symptoms and has had negative endoscopic investigations, we will evaluate his symptoms further with an MRA of the abdomen and pelvis with IV contrast to rule out mesenteric ischemia.  Other potential etiologies that could explain his symptoms may include possible gastroparesis versus gastric outlet obstruction given endoscopic findings seen by Dr. Laural Golden.  I advised him to chew his food thoroughly and take his time eating as this will help avoiding slow emptying.  She had presented some improvement with the use of sucralfate, I will refill this medication  He was also found to have incidentally esophageal varices during his most recent endoscopic evaluation which along with the imaging findings of the most recent CT scan are consistent with liver cirrhosis.  He has not presented any other abnormalities concerning for decompensated liver disease.  At this moment, we will will look for autoimmune and viral etiologies for his liver disease, although it is likely that this is related to NASH.  We will also check repeat MELD labs and AFP.  Patient was given recommendations regarding his liver cirrhosis.  Finally, the patient has presented improvement of his anemia with oral iron intake.  Anemia is related to his AVMs but he should continue taking his oral iron on a daily basis.  - Schedule MRA abdomen with IV contrast -Check MELD labs, CBC, AFP, acute viral hepatitis, AIH panel - Eat smaller pieces and take time when eating -Sucralfate 1 g every 6 hours -Continue oral iron supplementation - Reduce salt intake to <2 g per day - Can take Tylenol max of 2 g per day  (650 mg q8h) for pain - Avoid NSAIDs for pain - Avoid eating raw oysters/shellfish - Protein shake (Ensure or Boost) every night before going to sleep  All questions were answered.      Harvel Quale, MD Gastroenterology and Hepatology [facility]

## 2021-09-25 NOTE — Patient Instructions (Addendum)
Schedule MRA abdomen with IV contrast Perform blood workup Eat smaller pieces and take your time eating - Reduce salt intake to <2 g per day - Can take Tylenol max of 2 g per day (650 mg q8h) for pain - Avoid NSAIDs for pain - Avoid eating raw oysters/shellfish - Protein shake (Ensure or Boost) every night before going to sleep

## 2021-09-26 ENCOUNTER — Ambulatory Visit (INDEPENDENT_AMBULATORY_CARE_PROVIDER_SITE_OTHER): Payer: Medicare Other | Admitting: Internal Medicine

## 2021-09-27 ENCOUNTER — Telehealth: Payer: Self-pay | Admitting: Cardiology

## 2021-09-27 NOTE — Telephone Encounter (Signed)
Pt c/o swelling: STAT is pt has developed SOB within 24 hours  How much weight have you gained and in what time span?  Unsure   If swelling, where is the swelling located?  Feet   Are you currently taking a fluid pill?  Patient was taken off Lasix while admitted in the hospital  Are you currently SOB?  No   Do you have a log of your daily weights (if so, list)?  No log available  Have you gained 3 pounds in a day or 5 pounds in a week?  Unsure   Have you traveled recently?  No

## 2021-09-27 NOTE — Telephone Encounter (Signed)
Spoke with daughter in law who states that pt has swelling to feet that was noticed today. Pt c/o being SOB but no increased SOB since  being released from hospital. Pt refuses to weigh daily. Current weight today is 193.6 lbs. Pt is eating take out and sandwiches. Daughter in law states that when she cooks she does not add salt. Please advise.

## 2021-09-28 LAB — CBC WITH DIFFERENTIAL/PLATELET
Absolute Monocytes: 583 cells/uL (ref 200–950)
Basophils Absolute: 70 cells/uL (ref 0–200)
Basophils Relative: 1.3 %
Eosinophils Absolute: 140 cells/uL (ref 15–500)
Eosinophils Relative: 2.6 %
HCT: 38.1 % — ABNORMAL LOW (ref 38.5–50.0)
Hemoglobin: 12.5 g/dL — ABNORMAL LOW (ref 13.2–17.1)
Lymphs Abs: 783 cells/uL — ABNORMAL LOW (ref 850–3900)
MCH: 32.1 pg (ref 27.0–33.0)
MCHC: 32.8 g/dL (ref 32.0–36.0)
MCV: 97.9 fL (ref 80.0–100.0)
MPV: 13.4 fL — ABNORMAL HIGH (ref 7.5–12.5)
Monocytes Relative: 10.8 %
Neutro Abs: 3823 cells/uL (ref 1500–7800)
Neutrophils Relative %: 70.8 %
Platelets: 189 10*3/uL (ref 140–400)
RBC: 3.89 10*6/uL — ABNORMAL LOW (ref 4.20–5.80)
RDW: 15.3 % — ABNORMAL HIGH (ref 11.0–15.0)
Total Lymphocyte: 14.5 %
WBC: 5.4 10*3/uL (ref 3.8–10.8)

## 2021-09-28 LAB — IGG: IgG (Immunoglobin G), Serum: 1134 mg/dL (ref 600–1540)

## 2021-09-28 LAB — COMPREHENSIVE METABOLIC PANEL
AG Ratio: 1.2 (calc) (ref 1.0–2.5)
ALT: 24 U/L (ref 9–46)
AST: 34 U/L (ref 10–35)
Albumin: 3.3 g/dL — ABNORMAL LOW (ref 3.6–5.1)
Alkaline phosphatase (APISO): 90 U/L (ref 35–144)
BUN/Creatinine Ratio: 18 (calc) (ref 6–22)
BUN: 47 mg/dL — ABNORMAL HIGH (ref 7–25)
CO2: 23 mmol/L (ref 20–32)
Calcium: 9.4 mg/dL (ref 8.6–10.3)
Chloride: 110 mmol/L (ref 98–110)
Creat: 2.55 mg/dL — ABNORMAL HIGH (ref 0.70–1.22)
Globulin: 2.7 g/dL (calc) (ref 1.9–3.7)
Glucose, Bld: 97 mg/dL (ref 65–99)
Potassium: 4.5 mmol/L (ref 3.5–5.3)
Sodium: 143 mmol/L (ref 135–146)
Total Bilirubin: 1.6 mg/dL — ABNORMAL HIGH (ref 0.2–1.2)
Total Protein: 6 g/dL — ABNORMAL LOW (ref 6.1–8.1)

## 2021-09-28 LAB — HEPATITIS PANEL, ACUTE
Hep A IgM: NONREACTIVE
Hep B C IgM: NONREACTIVE
Hepatitis B Surface Ag: NONREACTIVE
Hepatitis C Ab: NONREACTIVE
SIGNAL TO CUT-OFF: 0.11 (ref <1.00)

## 2021-09-28 LAB — AFP TUMOR MARKER: AFP-Tumor Marker: 1.7 ng/mL (ref <6.1)

## 2021-09-28 LAB — ANTI-SMOOTH MUSCLE ANTIBODY, IGG: Actin (Smooth Muscle) Antibody (IGG): <20 U (ref <20)

## 2021-09-28 LAB — ANA: Anti Nuclear Antibody (ANA): NEGATIVE

## 2021-09-28 LAB — PROTIME-INR
INR: 1.3 — ABNORMAL HIGH
Prothrombin Time: 12.8 s — ABNORMAL HIGH (ref 9.0–11.5)

## 2021-09-28 MED ORDER — FUROSEMIDE 20 MG PO TABS: 20.0000 mg | ORAL_TABLET | Freq: Every day | ORAL | 11 refills | Status: AC | PRN

## 2021-09-28 NOTE — Telephone Encounter (Signed)
[name] daughter in law notified that pt can take lasix 20 mg daily as needed. [name] voiced understanding.

## 2021-10-02 ENCOUNTER — Ambulatory Visit: Payer: Medicare Other | Admitting: Physician Assistant

## 2021-10-03 ENCOUNTER — Telehealth (INDEPENDENT_AMBULATORY_CARE_PROVIDER_SITE_OTHER): Payer: Self-pay

## 2021-10-03 DIAGNOSIS — K7460 Unspecified cirrhosis of liver: Secondary | ICD-10-CM | POA: Diagnosis not present

## 2021-10-03 DIAGNOSIS — I5042 Chronic combined systolic (congestive) and diastolic (congestive) heart failure: Secondary | ICD-10-CM | POA: Diagnosis not present

## 2021-10-03 DIAGNOSIS — Z515 Encounter for palliative care: Secondary | ICD-10-CM | POA: Diagnosis not present

## 2021-10-03 DIAGNOSIS — N184 Chronic kidney disease, stage 4 (severe): Secondary | ICD-10-CM | POA: Diagnosis not present

## 2021-10-03 NOTE — Telephone Encounter (Signed)
Ronald Severin, NP with [facility] called today from [phone] . Would like to speak with Dr. [name] regarding this patient and his goals.She states she visited him this am to discuss end of life care and he discussed [place] ordering a MRI/MRA and she would like to know, if patient is found to have ischemia what will be the outcome or treatment for this . He is not walking well and is needed assistance with transportation. Suanne Marker would like a call back regarding this patient.Please advise.

## 2021-10-03 NOTE — Telephone Encounter (Signed)
Spoke with Suanne Marker today regarding the patient's case.  I explained that if the MRI shows chronic mesenteric ischemia he may be potentially eligible for stent placement to improve his symptoms.  He is not interested in having any surgeries but this will be less invasive than surgery.  However, I stated that if he wanted to have some further work-up of his abdominal pain this will be the next step.  She understood and agreed.  Will discuss this with the patient and the family.

## 2021-10-04 ENCOUNTER — Telehealth: Payer: Self-pay | Admitting: Cardiology

## 2021-10-04 ENCOUNTER — Other Ambulatory Visit (HOSPITAL_COMMUNITY): Payer: Medicare Other

## 2021-10-04 DIAGNOSIS — H9190 Unspecified hearing loss, unspecified ear: Secondary | ICD-10-CM | POA: Diagnosis not present

## 2021-10-04 DIAGNOSIS — I5023 Acute on chronic systolic (congestive) heart failure: Secondary | ICD-10-CM | POA: Diagnosis not present

## 2021-10-04 DIAGNOSIS — R932 Abnormal findings on diagnostic imaging of liver and biliary tract: Secondary | ICD-10-CM | POA: Diagnosis not present

## 2021-10-04 DIAGNOSIS — Z792 Long term (current) use of antibiotics: Secondary | ICD-10-CM | POA: Diagnosis not present

## 2021-10-04 DIAGNOSIS — M109 Gout, unspecified: Secondary | ICD-10-CM | POA: Diagnosis not present

## 2021-10-04 DIAGNOSIS — I4819 Other persistent atrial fibrillation: Secondary | ICD-10-CM | POA: Diagnosis not present

## 2021-10-04 DIAGNOSIS — Z7901 Long term (current) use of anticoagulants: Secondary | ICD-10-CM | POA: Diagnosis not present

## 2021-10-04 DIAGNOSIS — Z8711 Personal history of peptic ulcer disease: Secondary | ICD-10-CM | POA: Diagnosis not present

## 2021-10-04 DIAGNOSIS — I4581 Long QT syndrome: Secondary | ICD-10-CM | POA: Diagnosis not present

## 2021-10-04 DIAGNOSIS — D696 Thrombocytopenia, unspecified: Secondary | ICD-10-CM | POA: Diagnosis not present

## 2021-10-04 DIAGNOSIS — N189 Chronic kidney disease, unspecified: Secondary | ICD-10-CM | POA: Diagnosis not present

## 2021-10-04 DIAGNOSIS — I130 Hypertensive heart and chronic kidney disease with heart failure and stage 1 through stage 4 chronic kidney disease, or unspecified chronic kidney disease: Secondary | ICD-10-CM | POA: Diagnosis not present

## 2021-10-04 DIAGNOSIS — Z9181 History of falling: Secondary | ICD-10-CM | POA: Diagnosis not present

## 2021-10-04 DIAGNOSIS — R419 Unspecified symptoms and signs involving cognitive functions and awareness: Secondary | ICD-10-CM | POA: Diagnosis not present

## 2021-10-04 DIAGNOSIS — E875 Hyperkalemia: Secondary | ICD-10-CM | POA: Diagnosis not present

## 2021-10-04 DIAGNOSIS — K219 Gastro-esophageal reflux disease without esophagitis: Secondary | ICD-10-CM | POA: Diagnosis not present

## 2021-10-04 DIAGNOSIS — K5732 Diverticulitis of large intestine without perforation or abscess without bleeding: Secondary | ICD-10-CM | POA: Diagnosis not present

## 2021-10-04 DIAGNOSIS — E785 Hyperlipidemia, unspecified: Secondary | ICD-10-CM | POA: Diagnosis not present

## 2021-10-04 NOTE — Telephone Encounter (Signed)
I spoke with family member, [name]. She gave RonaldColon approximately 6 ounces of cold water at 9 am today. He normally doesn't drink a lot but today he guzzled the whole cup at one time. He coughed immediately and spit some water out. He told [name] it felt strange in his stomach and he felt SOB. He is speaking freely now, with no coughing. [name] is going to bring her pulse oximeter over and check his O2 sat.I encouraged her to continue to monitor, only give him small sips of water now.She will call back with pulse ox reading and give update.     I will FYI B.Ahmed Prima, PA-C

## 2021-10-04 NOTE — Telephone Encounter (Signed)
Pt c/o Shortness Of Breath: STAT if SOB developed within the last 24 hours or pt is noticeably SOB on the phone  1. Are you currently SOB (can you hear that pt is SOB on the phone)? Yes per his relative  2. How long have you been experiencing SOB? 30 minutes  3. Are you SOB when sitting or when up moving around? Sitting  4. Are you currently experiencing any other symptoms? Pt was drinking water and got strangled this morning- been short of breath every since that time

## 2021-10-04 NOTE — Telephone Encounter (Signed)
° °  His episode sounds most concerning for aspiration. Agree with small sips of water and I would recommend they review with his PCP to see if a Speech Therapy evaluation is indicated for a swallow study if not obtained in the past as he may end up requiring thickened liquids.   Thanks,  [place]

## 2021-10-04 NOTE — Telephone Encounter (Signed)
Spoke to [employer], family member, who verbalized understanding. Ms. Zuercher will contact pt's PCP for follow up. Pt's family member had no other questions or concerns at this time.

## 2021-10-06 ENCOUNTER — Other Ambulatory Visit: Payer: Self-pay

## 2021-10-06 ENCOUNTER — Ambulatory Visit (HOSPITAL_COMMUNITY)
Admission: RE | Admit: 2021-10-06 | Discharge: 2021-10-06 | Disposition: A | Discharge status: Home / Self Care | Payer: Medicare Other | Source: Ambulatory Visit | Attending: Gastroenterology | Admitting: Gastroenterology

## 2021-10-06 DIAGNOSIS — G8929 Other chronic pain: Secondary | ICD-10-CM | POA: Insufficient documentation

## 2021-10-06 DIAGNOSIS — N200 Calculus of kidney: Secondary | ICD-10-CM | POA: Diagnosis not present

## 2021-10-06 DIAGNOSIS — K55059 Acute (reversible) ischemia of intestine, part and extent unspecified: Secondary | ICD-10-CM | POA: Insufficient documentation

## 2021-10-06 DIAGNOSIS — R188 Other ascites: Secondary | ICD-10-CM | POA: Diagnosis not present

## 2021-10-06 DIAGNOSIS — R1013 Epigastric pain: Secondary | ICD-10-CM | POA: Diagnosis not present

## 2021-10-06 DIAGNOSIS — K7460 Unspecified cirrhosis of liver: Secondary | ICD-10-CM | POA: Diagnosis not present

## 2021-10-06 MED ORDER — GADOBUTROL 1 MMOL/ML IV SOLN
9.0000 mL | Freq: Once | INTRAVENOUS | Status: AC | PRN
  Administered 2021-10-06: 9 mL via INTRAVENOUS

## 2021-10-09 ENCOUNTER — Other Ambulatory Visit: Payer: Self-pay | Admitting: Gastroenterology

## 2021-10-09 DIAGNOSIS — R1310 Dysphagia, unspecified: Secondary | ICD-10-CM | POA: Diagnosis not present

## 2021-10-09 DIAGNOSIS — I4581 Long QT syndrome: Secondary | ICD-10-CM | POA: Diagnosis not present

## 2021-10-09 DIAGNOSIS — I4819 Other persistent atrial fibrillation: Secondary | ICD-10-CM | POA: Diagnosis not present

## 2021-10-09 DIAGNOSIS — K551 Chronic vascular disorders of intestine: Secondary | ICD-10-CM

## 2021-10-09 DIAGNOSIS — N189 Chronic kidney disease, unspecified: Secondary | ICD-10-CM | POA: Diagnosis not present

## 2021-10-09 DIAGNOSIS — K5732 Diverticulitis of large intestine without perforation or abscess without bleeding: Secondary | ICD-10-CM | POA: Diagnosis not present

## 2021-10-09 DIAGNOSIS — I130 Hypertensive heart and chronic kidney disease with heart failure and stage 1 through stage 4 chronic kidney disease, or unspecified chronic kidney disease: Secondary | ICD-10-CM | POA: Diagnosis not present

## 2021-10-09 DIAGNOSIS — I5023 Acute on chronic systolic (congestive) heart failure: Secondary | ICD-10-CM | POA: Diagnosis not present

## 2021-10-09 DIAGNOSIS — N401 Enlarged prostate with lower urinary tract symptoms: Secondary | ICD-10-CM | POA: Diagnosis not present

## 2021-10-10 ENCOUNTER — Ambulatory Visit (INDEPENDENT_AMBULATORY_CARE_PROVIDER_SITE_OTHER): Payer: Medicare Other | Admitting: Internal Medicine

## 2021-10-11 ENCOUNTER — Other Ambulatory Visit (HOSPITAL_COMMUNITY): Payer: Self-pay | Admitting: Interventional Radiology

## 2021-10-11 ENCOUNTER — Encounter: Payer: Self-pay | Admitting: *Deleted

## 2021-10-11 ENCOUNTER — Ambulatory Visit
Admission: RE | Admit: 2021-10-11 | Discharge: 2021-10-11 | Disposition: A | Discharge status: Home / Self Care | Payer: Medicare Other | Source: Ambulatory Visit | Attending: Gastroenterology | Admitting: Gastroenterology

## 2021-10-11 ENCOUNTER — Other Ambulatory Visit: Payer: Self-pay | Admitting: Interventional Radiology

## 2021-10-11 ENCOUNTER — Other Ambulatory Visit: Payer: Self-pay

## 2021-10-11 ENCOUNTER — Other Ambulatory Visit (INDEPENDENT_AMBULATORY_CARE_PROVIDER_SITE_OTHER): Payer: Self-pay | Admitting: Gastroenterology

## 2021-10-11 DIAGNOSIS — R1084 Generalized abdominal pain: Secondary | ICD-10-CM

## 2021-10-11 DIAGNOSIS — J90 Pleural effusion, not elsewhere classified: Secondary | ICD-10-CM | POA: Diagnosis not present

## 2021-10-11 DIAGNOSIS — R188 Other ascites: Secondary | ICD-10-CM | POA: Diagnosis not present

## 2021-10-11 DIAGNOSIS — K551 Chronic vascular disorders of intestine: Secondary | ICD-10-CM

## 2021-10-11 HISTORY — PX: IR RADIOLOGIST EVAL & MGMT: IMG5224

## 2021-10-11 NOTE — Consult Note (Incomplete)
Chief Complaint: Abdominal pain, concern for mesenteric ischemia   Referring Physician(s): Castaneda Mayorga,Daniel  History of Present Illness: Ronald Colon is a 88 y.o. male with past medical history significant for hypertension, hyperlipidemia, chronic renal insufficiency, atrial fibrillation (on Eliquis), small bowel AVM and chronic anemia who is seen today in telemedicine consultation for evaluation for potential percutaneous management of chronic left upper quadrant abdominal pain and concern for chronic mesenteric ischemia.  The consultation is performed primarily in conjunction with the patient's daughter-in-law, Ronald Colon, as the patient and the patient's son are severely hard of hearing.  Per Ronald Colon's report, her father-in-law developed the onset of abdominal pain in September of last year.  The abdominal pain coincided with worsening shortness of breath and now chronic congestive heart failure.  She states that his pain is associated with a relatively persistent burning sensation, worse within the epigastrium and is associated with occasional nausea and vomiting.  She denies the patient is ever experienced vomiting of blood nor has he had any bloody or melanotic stools.  His abdominal discomfort is not particularly associated with food ingestion and he does not have any food phobia.  The patient has not had any unintentional weight loss since the onset of his abdominal discomfort though this may be attributable to his persistent CHF since that time.  Patient has history of chronic renal insufficiency with most recent creatinine of 2.6 on 09/25/2021.   Past Medical History:  Diagnosis Date   Aortic stenosis    Mild February 2018   Arthritis    Atrial fibrillation [facility])    Diagnosed January 2018   AVM (arteriovenous malformation)    Carotid stenosis    Diverticulosis    Essential hypertension    Gout    Hx of adenomatous colonic polyps    Hyperlipidemia     IDA (iron deficiency anemia)    Internal hemorrhoids    Rheumatic fever 1937   Stroke ([place]) 09/10/2016   Tubular adenoma of colon     Past Surgical History:  Procedure Laterality Date   BIOPSY  09/13/2021   Procedure: BIOPSY;  Surgeon: Rogene Houston, MD;  [facility];  Service: Endoscopy;;   CATARACT EXTRACTION W/PHACO Right 08/02/2014   Procedure: CATARACT EXTRACTION PHACO AND INTRAOCULAR LENS PLACEMENT; CDE:  12.77;  Surgeon: Williams Che, MD;  Location: [facility];  Service: Ophthalmology;  Laterality: Right;   CATARACT EXTRACTION W/PHACO Left 02/15/2015   Procedure: CATARACT EXTRACTION PHACO AND INTRAOCULAR LENS PLACEMENT (IOC);  Surgeon: Williams Che, MD;  Location: [facility];  Service: Ophthalmology;  Laterality: Left;  CDE 18.00   COLONOSCOPY     COLONOSCOPY WITH PROPOFOL N/A 12/13/2016   ileum normal, polyp in cecum, ascending colon-multiple, transverse colon, diverticulosis in sigmoid descending and transverse, narrowing of colon in assoc with diverticular opening, internal hemorrhoids   ELBOW SURGERY Right    "grissle in there"   ENDARTERECTOMY Left 09/17/2016   Procedure: LEFT CAROTID ENDARTECTOMY;  Surgeon: Waynetta Sandy, MD;  Location: [place];  Service: Vascular;  Laterality: Left;   ESOPHAGEAL DILATION  09/03/2013   Procedure: ESOPHAGEAL DILATION;  Surgeon: Rogene Houston, MD;  [facility];  Service: Endoscopy;;   ESOPHAGOGASTRODUODENOSCOPY N/A 09/03/2013   Procedure: ESOPHAGOGASTRODUODENOSCOPY (EGD);  Surgeon: Rogene Houston, MD;  [facility];  Service: Endoscopy;  Laterality: N/A;  255-rescheduled to 1030 Ann to notify pt   ESOPHAGOGASTRODUODENOSCOPY (EGD) WITH PROPOFOL N/A 12/13/2016   normal, no specimens  ESOPHAGOGASTRODUODENOSCOPY (EGD) WITH PROPOFOL N/A 09/13/2021   Procedure: ESOPHAGOGASTRODUODENOSCOPY (EGD) WITH PROPOFOL;  Surgeon: Rogene Houston, MD;  [facility];  Service: Endoscopy;   Laterality: N/A;   GIVENS CAPSULE STUDY N/A 08/17/2017   AVMs   PATCH ANGIOPLASTY Left 09/17/2016   Procedure: PATCH ANGIOPLASTY WITH Rueben Bash BIOLOGIC PATCH 1 CM X6 CM;  Surgeon: Waynetta Sandy, MD;  Location: [facility] OR;  Service: Vascular;  Laterality: Left;   POLYPECTOMY  09/13/2021   Procedure: POLYPECTOMY;  Surgeon: Rogene Houston, MD;  [facility];  Service: Endoscopy;;  used bx forcep    Allergies: Patient has no known allergies.  Medications: Prior to Admission medications   Medication Sig Start Date End Date Taking? Authorizing Provider  acetaminophen (TYLENOL) 325 MG tablet Take 650 mg by mouth daily as needed for mild pain or headache.     [provider]  allopurinol (ZYLOPRIM) 100 MG tablet Take 0.5 tablets (50 mg total) by mouth daily. 09/15/21   Mercy Riding, MD  amiodarone (PACERONE) 200 MG tablet Take 0.5 tablets (100 mg total) by mouth daily. 05/07/17   Satira Sark, MD  apixaban (ELIQUIS) 2.5 MG TABS tablet TAKE 1 TABLET BY MOUTH TWICE A DAY 08/15/21   Satira Sark, MD  atorvastatin (LIPITOR) 40 MG tablet Take 1 tablet (40 mg total) by mouth daily at 6 PM. Patient taking differently: Take 40 mg by mouth daily. 09/19/16   Alvia Grove, PA-C  colchicine 0.6 MG tablet Take 1 tablet (0.6 mg total) by mouth daily as needed (gout). Patient not taking: Reported on 09/25/2021 09/15/21   Mercy Riding, MD  ezetimibe (ZETIA) 10 MG tablet Take 10 mg by mouth daily.    [provider]  FeFum-FePoly-FA-B Cmp-C-Biot (FOLIVANE-PLUS) CAPS TAKE 1 CAPSULE BY MOUTH EVERY DAY IN THE MORNING Patient taking differently: Take 1 capsule by mouth daily. 10/29/18   Pyrtle, Lajuan Lines, MD  fish oil-omega-3 fatty acids 1000 MG capsule Take 1 g by mouth daily.    [provider]  furosemide (LASIX) 20 MG tablet Take 1 tablet (20 mg total) by mouth daily as needed for edema or fluid (or weight gian over 2 lbs). 09/28/21   Satira Sark, MD   metoprolol succinate (TOPROL-XL) 25 MG 24 hr tablet Take 1 tablet (25 mg total) by mouth daily. 09/15/21   Mercy Riding, MD  pantoprazole (PROTONIX) 40 MG tablet Take 1 tablet (40 mg total) by mouth 2 (two) times daily. Patient taking differently: Take by mouth daily. 07/03/21 09/25/21  Carlan, Deatra Robinson, NP  polyethylene glycol powder (MIRALAX) 17 GM/SCOOP powder Take 17 g by mouth 2 (two) times daily as needed for moderate constipation or mild constipation. 09/15/21   Mercy Riding, MD  prochlorperazine (COMPAZINE) 5 MG tablet Take 1 tablet (5 mg total) by mouth every 8 (eight) hours as needed for nausea or vomiting. Patient not taking: Reported on 09/25/2021 08/21/21   Harvel Quale, MD  sucralfate (CARAFATE) 1 g tablet Take 1 tablet (1 g total) by mouth 4 (four) times daily -  with meals and at bedtime. Take one tid with meals. 09/25/21   Harvel Quale, MD     Family History  Problem Relation Age of Onset   CVA Mother 26   Pneumonia Father 39   Colon cancer Neg Hx     Social History   Socioeconomic History   Marital status: Married    Spouse name:  Not on file   Number of children: 1   Years of education: Not on file   Highest education level: Not on file  Occupational History   Occupation: Retired  Tobacco Use   Smoking status: Former    Packs/day: 1.00    Years: 20.00    Pack years: 20.00    Types: Cigarettes    Quit date: 1980    Years since quitting: 43.1   Smokeless tobacco: Never  Vaping Use   Vaping Use: Never used  Substance and Sexual Activity   Alcohol use: No   Drug use: No   Sexual activity: Not on file  Other Topics Concern   Not on file  Social History Narrative   Not on file   Social Determinants of Health   Financial Resource Strain: Not on file  Food Insecurity: Not on file  Transportation Needs: Not on file  Physical Activity: Not on file  Stress: Not on file  Social Connections: Not on file    ECOG  Status: 1 - Symptomatic but completely ambulatory  Review of Systems  Review of Systems: A 12 point ROS discussed and pertinent positives are indicated in the HPI above.  All other systems are negative.  Physical Exam No direct physical exam was performed (except for noted visual exam findings with Video Visits).   Vital Signs: There were no vitals taken for this visit.  Imaging:  Following examinations were personally reviewed in detail: Abdominal MRA-10/06/2021; CT abdomen pelvis-09/10/2021  Personal review of abdominal MRA performed 10/06/2021 suggests hemodynamically significant narrowing involving the origin and proximal aspect of the celiac artery as well as the IMA with patency of the SMA.    Re-demonstrated small bilateral effusions and associated small to moderate volume intra-abdominal ascites, progressed compared to abdominal CT performed 09/10/2021.  There is mild nodularity hepatic contour compatible with known history of cirrhosis.  Review of noncontrast CT scan abdomen pelvis performed 09/10/2021 demonstrates a moderate to large amount of slightly irregular prominently calcified atherosclerotic plaque throughout the abdominal aorta as well as the bilateral common iliac arteries and the right common femoral artery, so evaluated secondary to lack of intravenous contrast.  MR ANGIO ABDOMEN W WO CONTRAST  Result Date: 10/06/2021 CLINICAL DATA:  88 year old male with left upper quadrant abdominal pain EXAM: MRI ABDOMEN WITHOUT AND WITH CONTRAST TECHNIQUE: Multiplanar multisequence MR imaging of the abdomen was performed both before and after the administration of intravenous contrast. CONTRAST:  51mL GADAVIST GADOBUTROL 1 MMOL/ML IV SOLN COMPARISON:  Abdominal CT 09/10/2021 FINDINGS: NONVASCULAR: Lower chest: Bilateral pleural effusions. Associated atelectatic changes. Cardiomegaly Hepatobiliary: Artifact somewhat limits evaluation of the solid organs. Relatively uniform signal within  the liver parenchyma, with no focal lesion. Unremarkable gallbladder. Pancreas:  Unremarkable signal of the pancreas Spleen:  Unremarkable spleen Adrenals/Urinary Tract:  Unremarkable bilateral adrenal glands. Symmetric cortical thinning of the bilateral kidneys. No evidence of right-sided hydronephrosis. There are a few punctate foci of signal loss in the lower pole collecting system which are favored to represent small nonobstructing calcified stones. No evidence of left-sided hydronephrosis. T2 intense lesion within the lateral cortex most compatible with a cyst although artifact somewhat limits evaluation on the current study. Linear signal loss within the upper pole collecting system compatible with nonobstructing stone versus vascular calcification. Stomach/Bowel: Unremarkable stomach, small bowel, colon on the MRI. Motion artifact limits evaluation of the GI tract. Mesentery/peritoneal: Moderate ascites Other:  Body wall edema/anasarca.  Mesenteric edema/anasarca Musculoskeletal: Degenerative changes of the spine. VASCULAR: Aorta:  Mild irregularity of the abdominal aorta compatible with mild atherosclerotic changes. No dissection or wall thickening. Celiac artery: Configuration of the course of the celiac artery suggests some compression by the overlying diaphragmatic cruise. There is focus of signal loss within the proximal celiac artery, either a critical stenosis or occlusion just after the origin. Maintained flow signal beyond this site of stenosis/occlusion, includes the branch vessels. SMA: Flow signal maintained within the proximal SMA without evidence of high-grade stenosis. Branch vessels demonstrate maintained flow signal. IMA: Attenuation the flow signal at the proximal IMA, compatible with high-grade stenosis. Superior rectal arteries and the left colonic artery demonstrate maintained flow signal. Renal arteries: Right renal artery: Single right renal artery. No evidence of high-grade stenosis at  the origin. Some irregularity in the mid segment is compatible with atherosclerotic change. Left renal artery: Single left renal artery. No evidence of high-grade stenosis at the origin. Some irregularity in the mid segment compatible with atherosclerotic change. Right lower extremity: Flow signal maintained of the right CIA, hypogastric artery, EIA, with no high-grade stenosis or occlusion. Left lower extremity: Flow signal maintained in the left CIA, hypogastric artery, EIA, with no high-grade stenosis or occlusion. 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 Stenoses of 2/3 of the mesenteric arteries would support the possibility of chronic mesenteric ischemia. Aortic Atherosclerosis (ICD10-I70.0). Evidence of fluid positive state with bilateral pleural effusions, body wall and mesenteric anasarca/edema, and ascites. Signed, Dulcy Fanny. Dellia Nims, RPVI [facility] Electronically Signed   By: Corrie Mckusick D.O.   On: 10/06/2021 15:08   DG Chest Port 1 View  Result Date: 09/14/2021 CLINICAL DATA:  Shortness of breath EXAM: PORTABLE CHEST 1 VIEW COMPARISON:  Previous studies including the examination of 09/10/2021 FINDINGS: Transverse diameter of heart is increased. Central pulmonary vessels are prominent. There is improvement in aeration of left lower lung fields. Left lateral CP angle is clear. There is haziness in the right lower lung fields suggesting increase in right pleural effusion. No new focal pulmonary infiltrates are noted. There is slight prominence of interstitial markings in the right parahilar region and right lower lung fields. There is no pneumothorax. IMPRESSION: Cardiomegaly. There is slight prominence of interstitial markings in the right parahilar region and right  lower lung fields, possibly suggesting asymmetric pulmonary edema. Small right pleural effusion. There is interval decrease in the left pleural effusion. Electronically Signed   By: Elmer Picker M.D.   On: 09/14/2021 08:07   ECHOCARDIOGRAM COMPLETE  Result Date: 09/11/2021    ECHOCARDIOGRAM REPORT   Patient Name:   MALEEK CRAVER Date of Exam: 09/11/2021 Medical Rec #:  [id number]     Height:       74.0 in Accession #:    [id number]    Weight:       195.0 lb Date of Birth:  02/02/1929     BSA:          2.150 m Patient Age:    38 years      BP:           94/62 mmHg Patient Gender: M             HR:           66 bpm. Exam Location:  [facility] Procedure: 2D Echo, Cardiac Doppler, Color Doppler and Intracardiac  Opacification Agent Indications:    Dyspnea  History:        Patient has prior history of Echocardiogram examinations, most                 recent 08/17/2017. Stroke, Arrythmias:Atrial Fibrillation; Risk                 Factors:Hypertension, Dyslipidemia and Former Smoker.  Sonographer:    Wenda Low Referring Phys: [place] Comments: Technically difficult study due to poor echo windows. IMPRESSIONS  1. Left ventricular ejection fraction, by estimation, is 20 to 25%. The left ventricle has severely decreased function. The left ventricle demonstrates global hypokinesis. The left ventricular internal cavity size was moderately dilated. There is mild concentric left ventricular hypertrophy. Left ventricular diastolic parameters are consistent with Grade III diastolic dysfunction (restrictive).  2. Right ventricular systolic function is mildly reduced. The right ventricular size is mildly-to-moderately enlarged. There is moderately elevated pulmonary artery systolic pressure. The estimated right ventricular systolic pressure is 32.4 mmHg.  3. Left atrial size was severely dilated.  4. Right atrial size was severely dilated.  5. The mitral valve is abnormal. Mild mitral  valve regurgitation. Moderate mitral annular calcification.  6. Tricuspid valve regurgitation is mild to moderate.  7. The aortic valve is tricuspid. There is moderate calcification of the aortic valve. There is moderate thickening of the aortic valve. Aortic valve regurgitation is trivial.  8. Aortic dilatation noted. There is mild dilatation of the aortic root, measuring 38 mm.  9. The inferior vena cava is dilated in size with <50% respiratory variability, suggesting right atrial pressure of 15 mmHg. Comparison(s): Compared to prior TTE in 2019, the LVEF is no severely reduced at 20-25% (previously 45-50%). FINDINGS  Left Ventricle: Left ventricular ejection fraction, by estimation, is 20 to 25%. The left ventricle has severely decreased function. The left ventricle demonstrates global hypokinesis. Definity contrast agent was given IV to delineate the left ventricular endocardial borders. The left ventricular internal cavity size was moderately dilated. There is mild concentric left ventricular hypertrophy. Left ventricular diastolic parameters are consistent with Grade III diastolic dysfunction (restrictive). Right Ventricle: The right ventricular size is mildly-to-moderately enlarged. Right vetricular wall thickness was not well visualized. Right ventricular systolic function is mildly reduced. There is moderately elevated pulmonary artery systolic pressure.  The tricuspid regurgitant velocity is 2.90 m/s, and with an assumed right atrial pressure of 15 mmHg, the estimated right ventricular systolic pressure is 40.1 mmHg. Left Atrium: Left atrial size was severely dilated. Right Atrium: Right atrial size was severely dilated. Pericardium: There is no evidence of pericardial effusion. Mitral Valve: The mitral valve is abnormal. There is mild thickening of the mitral valve leaflet(s). There is mild calcification of the mitral valve leaflet(s). Moderate mitral annular calcification. Mild mitral valve  regurgitation. MV peak gradient, 5.2  mmHg. The mean mitral valve gradient is 1.0 mmHg. Tricuspid Valve: The tricuspid valve is normal in structure. Tricuspid valve regurgitation is mild to moderate. Aortic Valve: Suspect moderate low flow, low gradient aortic stenosis with AVA 0.91cm2, mean gradient 24mmHg, Vmax 2.65m/s, DI 0.3, SVI 24. The aortic valve is tricuspid. There is moderate calcification of the aortic valve. There is moderate thickening of the aortic valve. Aortic valve regurgitation is trivial. Aortic valve mean gradient measures 13.0 mmHg. Aortic valve peak gradient measures 25.2 mmHg. Aortic valve area, by VTI measures 0.91 cm. Pulmonic Valve: The pulmonic valve was normal in structure. Pulmonic valve regurgitation is mild.  Aorta: Aortic dilatation noted. There is mild dilatation of the aortic root, measuring 38 mm. Venous: The inferior vena cava is dilated in size with less than 50% respiratory variability, suggesting right atrial pressure of 15 mmHg. IAS/Shunts: The atrial septum is grossly normal.  LEFT VENTRICLE PLAX 2D LVIDd:         6.10 cm   Diastology LVIDs:         5.40 cm   LV e' medial:    3.60 cm/s LV PW:         1.20 cm   LV E/e' medial:  28.1 LV IVS:        1.40 cm   LV e' lateral:   6.70 cm/s LVOT diam:     2.00 cm   LV E/e' lateral: 15.1 LV SV:         50 LV SV Index:   23 LVOT Area:     3.14 cm  RIGHT VENTRICLE RV Basal diam:  4.00 cm RV Mid diam:    3.80 cm RV S prime:     6.13 cm/s TAPSE (M-mode): 1.8 cm LEFT ATRIUM              Index        RIGHT ATRIUM           Index LA diam:        4.50 cm  2.09 cm/m   RA Area:     30.80 cm LA Vol (A2C):   116.0 ml 53.95 ml/m  RA Volume:   110.00 ml 51.16 ml/m LA Vol (A4C):   105.0 ml 48.83 ml/m LA Biplane Vol: 113.0 ml 52.55 ml/m  AORTIC VALVE                     PULMONIC VALVE AV Area (Vmax):    0.94 cm      PV Vmax:       0.54 m/s AV Area (Vmean):   0.89 cm      PV Peak grad:  1.2 mmHg AV Area (VTI):     0.91 cm AV Vmax:            251.00 cm/s AV Vmean:          168.000 cm/s AV VTI:            0.547 m AV Peak Grad:      25.2 mmHg AV Mean Grad:      13.0 mmHg LVOT Vmax:         75.10 cm/s LVOT Vmean:        47.800 cm/s LVOT VTI:          0.158 m LVOT/AV VTI ratio: 0.29  AORTA Ao Root diam: 3.80 cm Ao Asc diam:  3.20 cm MITRAL VALVE                TRICUSPID VALVE MV Area (PHT): 4.89 cm     TR Peak grad:   33.6 mmHg MV Area VTI:   2.20 cm     TR Vmax:        290.00 cm/s MV Peak grad:  5.2 mmHg MV Mean grad:  1.0 mmHg     SHUNTS MV Vmax:       1.14 m/s     Systemic VTI:  0.16 m MV Vmean:      54.2 cm/s    Systemic Diam: 2.00 cm MV Decel Time: 155 msec MV E velocity: 101.00 cm/s MV A velocity: 31.30  cm/s MV E/A ratio:  3.23 Gwyndolyn Kaufman MD Electronically signed by Gwyndolyn Kaufman MD Signature Date/Time: 09/11/2021/4:45:31 PM    Final     Labs:  CBC: Recent Labs    09/13/21 1200 09/14/21 0609 09/15/21 0808 09/25/21 1433  WBC 5.9 5.6 5.2 5.4  HGB 13.0 12.4* 12.1* 12.5*  HCT 40.1 38.5* 37.8* 38.1*  PLT 161 127* 132* 189    COAGS: Recent Labs    09/11/21 0454 09/25/21 1433  INR  --  1.3*  APTT 32  --     BMP: Recent Labs    09/12/21 0544 09/13/21 0548 09/14/21 0609 09/15/21 0808 09/25/21 1433  NA 138 136 138 137 143  K 5.2* 4.2 4.3 4.4 4.5  CL 108 106 106 108 110  CO2 22 24 22 22 23   GLUCOSE 98 105* 101* 105* 97  BUN 41* 41* 40* 40* 47*  CALCIUM 8.9 8.6* 8.9 8.7* 9.4  CREATININE 2.15* 2.42* 2.26* 2.40* 2.55*  GFRNONAA 29* 25* 27* 25*  --     LIVER FUNCTION TESTS: Recent Labs    09/10/21 1907 09/11/21 0454 09/12/21 0544 09/14/21 0609 09/15/21 0808 09/25/21 1433  BILITOT 1.5* 1.3* 1.2  --   --  1.6*  AST 42* 36 34  --   --  34  ALT 28 25 23   --   --  24  ALKPHOS 84 73 67  --   --   --   PROT 6.7 6.0* 5.9*  --   --  6.0*  ALBUMIN 3.4* 3.0* 2.9* 2.9* 2.8*  --     TUMOR MARKERS: Recent Labs    09/25/21 1433  AFPTM 1.7    Assessment and Plan:  JAMEL HOLZMANN is a 88 y.o. male with  past medical history significant for hypertension, hyperlipidemia, chronic renal insufficiency, atrial fibrillation (on Eliquis), small bowel AVM and chronic anemia who is seen today in telemedicine consultation for evaluation for potential percutaneous management of chronic left upper quadrant abdominal pain and concern for chronic mesenteric ischemia.  Following examinations were personally reviewed in detail: Abdominal MRA-10/06/2021; CT abdomen pelvis-09/10/2021  Personal review of abdominal MRA performed 10/06/2021 suggests hemodynamically significant narrowing involving the origin and proximal aspect of the celiac artery as well as the IMA with patency of the SMA.  Unfortunately, CT scan performed 09/10/2021 was performed without intravenous contrast secondary to patient's chronic renal insufficiency.  We demonstrated small bilateral effusions and associated small to moderate volume intra-abdominal ascites, new compared to abdominal CT performed 09/10/2021.  There is mild nodularity hepatic contour compatible with known history of cirrhosis.  Review of noncontrast CT scan abdomen pelvis performed 09/10/2021 demonstrates a moderate to large amount of slightly irregular prominently calcified atherosclerotic plaque throughout the abdominal aorta as well as the bilateral common iliac arteries and the right common femoral artery, incompletely evaluated secondary to lack of intravenous contrast.    The patient has a nontypical presentation for chronic mesenteric ischemia.  Specifically, there is no definitive association with his abdominal discomfort and the ingestion of food.  Additionally, his described burning abdominal sensation could also be associated with the worsening intra-abdominal ascites as was demonstrated on recent abdominal MRI performed 10/06/2021, worsening compared to the 09/10/2021 examination.  Thank you for this interesting consult.  I greatly enjoyed meeting Ronald Colon and look forward  to participating in their care.  A copy of this report was sent to the requesting provider on this date.  Electronically Signed: Sandi Mariscal 10/11/2021, 11:55  AM   I spent a total of 15 Minutes in remote  clinical consultation, greater than 50% of which was counseling/coordinating care for abdominal pain, concern for mesenteric ischemia.    Visit type: Audio only (telephone). Audio (no video) only due to patient's lack of internet/smartphone capability. Alternative for in-person consultation at [facility], [address] [place], [place], [place]. This visit type was conducted due to national recommendations for restrictions regarding the COVID-19 Pandemic (e.g. social distancing).  This format is felt to be most appropriate for this patient at this time.  All issues noted in this document were discussed and addressed.

## 2021-10-11 NOTE — Consult Note (Signed)
Chief Complaint: Abdominal pain, concern for mesenteric ischemia   Referring Physician(s): Castaneda Mayorga,Daniel  History of Present Illness: Ronald Colon is a 88 y.o. male with past medical history significant for hypertension, hyperlipidemia, chronic renal insufficiency, atrial fibrillation (on Eliquis), small bowel AVM and chronic anemia who is seen today in telemedicine consultation for evaluation for potential percutaneous management of chronic left upper quadrant abdominal pain and concern for chronic mesenteric ischemia.  The consultation is performed primarily in conjunction with the patient's daughter-in-law, Ronald Colon, as the patient and the patient's son are severely hard of hearing.  Per Cheryl's report, her father-in-law developed the onset of abdominal pain in September of last year.  The abdominal pain coincided with worsening shortness of breath and now chronic congestive heart failure.  She states that his pain is associated with a relatively persistent burning sensation, worse within the epigastrium and is associated with occasional nausea and vomiting.  She denies the patient is ever experienced vomiting of blood nor has he had any bloody or melanotic stools.  His abdominal discomfort is not particularly associated with food ingestion and he does not have any food phobia.  The patient has not had any unintentional weight loss since the onset of his abdominal discomfort though this may be attributable to his persistent CHF since that time.  Patient has history of chronic renal insufficiency with most recent creatinine of 2.6 on 09/25/2021.   Past Medical History:  Diagnosis Date   Aortic stenosis    Mild February 2018   Arthritis    Atrial fibrillation [facility])    Diagnosed January 2018   AVM (arteriovenous malformation)    Carotid stenosis    Diverticulosis    Essential hypertension    Gout    Hx of adenomatous colonic polyps    Hyperlipidemia    IDA (iron  deficiency anemia)    Internal hemorrhoids    Rheumatic fever 1937   Stroke ([place]) 09/10/2016   Tubular adenoma of colon     Past Surgical History:  Procedure Laterality Date   BIOPSY  09/13/2021   Procedure: BIOPSY;  Surgeon: Rogene Houston, MD;  [facility];  Service: Endoscopy;;   CATARACT EXTRACTION W/PHACO Right 08/02/2014   Procedure: CATARACT EXTRACTION PHACO AND INTRAOCULAR LENS PLACEMENT; CDE:  12.77;  Surgeon: Williams Che, MD;  Location: [facility];  Service: Ophthalmology;  Laterality: Right;   CATARACT EXTRACTION W/PHACO Left 02/15/2015   Procedure: CATARACT EXTRACTION PHACO AND INTRAOCULAR LENS PLACEMENT (IOC);  Surgeon: Williams Che, MD;  Location: [facility];  Service: Ophthalmology;  Laterality: Left;  CDE 18.00   COLONOSCOPY     COLONOSCOPY WITH PROPOFOL N/A 12/13/2016   ileum normal, polyp in cecum, ascending colon-multiple, transverse colon, diverticulosis in sigmoid descending and transverse, narrowing of colon in assoc with diverticular opening, internal hemorrhoids   ELBOW SURGERY Right    "grissle in there"   ENDARTERECTOMY Left 09/17/2016   Procedure: LEFT CAROTID ENDARTECTOMY;  Surgeon: Waynetta Sandy, MD;  Location: [place];  Service: Vascular;  Laterality: Left;   ESOPHAGEAL DILATION  09/03/2013   Procedure: ESOPHAGEAL DILATION;  Surgeon: Rogene Houston, MD;  [facility];  Service: Endoscopy;;   ESOPHAGOGASTRODUODENOSCOPY N/A 09/03/2013   Procedure: ESOPHAGOGASTRODUODENOSCOPY (EGD);  Surgeon: Rogene Houston, MD;  [facility];  Service: Endoscopy;  Laterality: N/A;  255-rescheduled to 1030 Ann to notify pt   ESOPHAGOGASTRODUODENOSCOPY (EGD) WITH PROPOFOL N/A 12/13/2016   normal, no specimens  ESOPHAGOGASTRODUODENOSCOPY (EGD) WITH PROPOFOL N/A 09/13/2021   Procedure: ESOPHAGOGASTRODUODENOSCOPY (EGD) WITH PROPOFOL;  Surgeon: Rogene Houston, MD;  [facility];  Service: Endoscopy;  Laterality: N/A;   GIVENS  CAPSULE STUDY N/A 08/17/2017   AVMs   PATCH ANGIOPLASTY Left 09/17/2016   Procedure: PATCH ANGIOPLASTY WITH Rueben Bash BIOLOGIC PATCH 1 CM X6 CM;  Surgeon: Waynetta Sandy, MD;  [facility]: [facility];  Service: Vascular;  Laterality: Left;   POLYPECTOMY  09/13/2021   Procedure: POLYPECTOMY;  Surgeon: Rogene Houston, MD;  [facility];  Service: Endoscopy;;  used bx forcep    Allergies: Patient has no known allergies.  Medications: Prior to Admission medications   Medication Sig Start Date End Date Taking? Authorizing Provider  acetaminophen (TYLENOL) 325 MG tablet Take 650 mg by mouth daily as needed for mild pain or headache.     [provider]  allopurinol (ZYLOPRIM) 100 MG tablet Take 0.5 tablets (50 mg total) by mouth daily. 09/15/21   Mercy Riding, MD  amiodarone (PACERONE) 200 MG tablet Take 0.5 tablets (100 mg total) by mouth daily. 05/07/17   Satira Sark, MD  apixaban (ELIQUIS) 2.5 MG TABS tablet TAKE 1 TABLET BY MOUTH TWICE A DAY 08/15/21   Satira Sark, MD  atorvastatin (LIPITOR) 40 MG tablet Take 1 tablet (40 mg total) by mouth daily at 6 PM. Patient taking differently: Take 40 mg by mouth daily. 09/19/16   Alvia Grove, PA-C  colchicine 0.6 MG tablet Take 1 tablet (0.6 mg total) by mouth daily as needed (gout). Patient not taking: Reported on 09/25/2021 09/15/21   Mercy Riding, MD  ezetimibe (ZETIA) 10 MG tablet Take 10 mg by mouth daily.    [provider]  FeFum-FePoly-FA-B Cmp-C-Biot (FOLIVANE-PLUS) CAPS TAKE 1 CAPSULE BY MOUTH EVERY DAY IN THE MORNING Patient taking differently: Take 1 capsule by mouth daily. 10/29/18   Pyrtle, Lajuan Lines, MD  fish oil-omega-3 fatty acids 1000 MG capsule Take 1 g by mouth daily.    [provider]  furosemide (LASIX) 20 MG tablet Take 1 tablet (20 mg total) by mouth daily as needed for edema or fluid (or weight gian over 2 lbs). 09/28/21   Satira Sark, MD  metoprolol succinate (TOPROL-XL) 25  MG 24 hr tablet Take 1 tablet (25 mg total) by mouth daily. 09/15/21   Mercy Riding, MD  pantoprazole (PROTONIX) 40 MG tablet Take 1 tablet (40 mg total) by mouth 2 (two) times daily. Patient taking differently: Take by mouth daily. 07/03/21 09/25/21  Carlan, Deatra Robinson, NP  polyethylene glycol powder (MIRALAX) 17 GM/SCOOP powder Take 17 g by mouth 2 (two) times daily as needed for moderate constipation or mild constipation. 09/15/21   Mercy Riding, MD  prochlorperazine (COMPAZINE) 5 MG tablet Take 1 tablet (5 mg total) by mouth every 8 (eight) hours as needed for nausea or vomiting. Patient not taking: Reported on 09/25/2021 08/21/21   Harvel Quale, MD  sucralfate (CARAFATE) 1 g tablet Take 1 tablet (1 g total) by mouth 4 (four) times daily -  with meals and at bedtime. Take one tid with meals. 09/25/21   Harvel Quale, MD     Family History  Problem Relation Age of Onset   CVA Mother 35   Pneumonia Father 49   Colon cancer Neg Hx     Social History   Socioeconomic History   Marital status: Married    Spouse name:  Not on file   Number of children: 1   Years of education: Not on file   Highest education level: Not on file  Occupational History   Occupation: Retired  Tobacco Use   Smoking status: Former    Packs/day: 1.00    Years: 20.00    Pack years: 20.00    Types: Cigarettes    Quit date: 1980    Years since quitting: 43.1   Smokeless tobacco: Never  Vaping Use   Vaping Use: Never used  Substance and Sexual Activity   Alcohol use: No   Drug use: No   Sexual activity: Not on file  Other Topics Concern   Not on file  Social History Narrative   Not on file   Social Determinants of Health   Financial Resource Strain: Not on file  Food Insecurity: Not on file  Transportation Needs: Not on file  Physical Activity: Not on file  Stress: Not on file  Social Connections: Not on file    ECOG Status: 1 - Symptomatic but completely ambulatory  Review  of Systems  Review of Systems: A 12 point ROS discussed and pertinent positives are indicated in the HPI above.  All other systems are negative.  Physical Exam No direct physical exam was performed (except for noted visual exam findings with Video Visits).   Vital Signs: There were no vitals taken for this visit.  Imaging:  Following examinations were personally reviewed in detail:  Abdominal MRA - 10/06/2021 CT abdomen and pelvis - 09/10/2021  Personal review of abdominal MRA performed 10/06/2021 suggests hemodynamically significant narrowing involving the origin and proximal aspect of the celiac artery as well as the IMA with patency of the SMA.    Re-demonstrated small bilateral effusions and associated small to moderate volume intra-abdominal ascites, progressed compared to abdominal CT performed 09/10/2021.  There is mild nodularity hepatic contour compatible with known history of cirrhosis.  Review of noncontrast CT scan abdomen pelvis performed 09/10/2021 demonstrates a moderate to large amount of slightly irregular prominently calcified atherosclerotic plaque throughout the abdominal aorta as well as the bilateral common iliac arteries and the right common femoral artery, suboptimally evaluated secondary to lack of intravenous contrast.  MR ANGIO ABDOMEN W WO CONTRAST  Result Date: 10/06/2021 CLINICAL DATA:  88 year old male with left upper quadrant abdominal pain EXAM: MRI ABDOMEN WITHOUT AND WITH CONTRAST TECHNIQUE: Multiplanar multisequence MR imaging of the abdomen was performed both before and after the administration of intravenous contrast. CONTRAST:  22mL GADAVIST GADOBUTROL 1 MMOL/ML IV SOLN COMPARISON:  Abdominal CT 09/10/2021 FINDINGS: NONVASCULAR: Lower chest: Bilateral pleural effusions. Associated atelectatic changes. Cardiomegaly Hepatobiliary: Artifact somewhat limits evaluation of the solid organs. Relatively uniform signal within the liver parenchyma, with no focal lesion.  Unremarkable gallbladder. Pancreas:  Unremarkable signal of the pancreas Spleen:  Unremarkable spleen Adrenals/Urinary Tract:  Unremarkable bilateral adrenal glands. Symmetric cortical thinning of the bilateral kidneys. No evidence of right-sided hydronephrosis. There are a few punctate foci of signal loss in the lower pole collecting system which are favored to represent small nonobstructing calcified stones. No evidence of left-sided hydronephrosis. T2 intense lesion within the lateral cortex most compatible with a cyst although artifact somewhat limits evaluation on the current study. Linear signal loss within the upper pole collecting system compatible with nonobstructing stone versus vascular calcification. Stomach/Bowel: Unremarkable stomach, small bowel, colon on the MRI. Motion artifact limits evaluation of the GI tract. Mesentery/peritoneal: Moderate ascites Other:  Body wall edema/anasarca.  Mesenteric edema/anasarca Musculoskeletal: Degenerative  changes of the spine. VASCULAR: Aorta: Mild irregularity of the abdominal aorta compatible with mild atherosclerotic changes. No dissection or wall thickening. Celiac artery: Configuration of the course of the celiac artery suggests some compression by the overlying diaphragmatic cruise. There is focus of signal loss within the proximal celiac artery, either a critical stenosis or occlusion just after the origin. Maintained flow signal beyond this site of stenosis/occlusion, includes the branch vessels. SMA: Flow signal maintained within the proximal SMA without evidence of high-grade stenosis. Branch vessels demonstrate maintained flow signal. IMA: Attenuation the flow signal at the proximal IMA, compatible with high-grade stenosis. Superior rectal arteries and the left colonic artery demonstrate maintained flow signal. Renal arteries: Right renal artery: Single right renal artery. No evidence of high-grade stenosis at the origin. Some irregularity in the mid  segment is compatible with atherosclerotic change. Left renal artery: Single left renal artery. No evidence of high-grade stenosis at the origin. Some irregularity in the mid segment compatible with atherosclerotic change. Right lower extremity: Flow signal maintained of the right CIA, hypogastric artery, EIA, with no high-grade stenosis or occlusion. Left lower extremity: Flow signal maintained in the left CIA, hypogastric artery, EIA, with no high-grade stenosis or occlusion. 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 Stenoses of 2/3 of the mesenteric arteries would support the possibility of chronic mesenteric ischemia. Aortic Atherosclerosis (ICD10-I70.0). Evidence of fluid positive state with bilateral pleural effusions, body wall and mesenteric anasarca/edema, and ascites. Signed, Dulcy Fanny. Dellia Nims, RPVI [facility] Radiology Electronically Signed   By: Corrie Mckusick D.O.   On: 10/06/2021 15:08   DG Chest Port 1 View  Result Date: 09/14/2021 CLINICAL DATA:  Shortness of breath EXAM: PORTABLE CHEST 1 VIEW COMPARISON:  Previous studies including the examination of 09/10/2021 FINDINGS: Transverse diameter of heart is increased. Central pulmonary vessels are prominent. There is improvement in aeration of left lower lung fields. Left lateral CP angle is clear. There is haziness in the right lower lung fields suggesting increase in right pleural effusion. No new focal pulmonary infiltrates are noted. There is slight prominence of interstitial markings in the right parahilar region and right lower lung fields. There is no pneumothorax. IMPRESSION: Cardiomegaly. There is slight prominence of interstitial markings in the right parahilar region and right lower lung fields, possibly suggesting  asymmetric pulmonary edema. Small right pleural effusion. There is interval decrease in the left pleural effusion. Electronically Signed   By: Elmer Picker M.D.   On: 09/14/2021 08:07   ECHOCARDIOGRAM COMPLETE  Result Date: 09/11/2021    ECHOCARDIOGRAM REPORT   Patient Name:   JHOVANY WEIDINGER Date of Exam: 09/11/2021 Medical Rec #:  [id number]     Height:       74.0 in Accession #:    [id number]    Weight:       195.0 lb Date of Birth:  1929/07/16     BSA:          2.150 m Patient Age:    32 years      BP:           94/62 mmHg Patient Gender: M             HR:           66 bpm. Exam Location:  [facility] Procedure: 2D Echo, Cardiac  Doppler, Color Doppler and Intracardiac            Opacification Agent Indications:    Dyspnea  History:        Patient has prior history of Echocardiogram examinations, most                 recent 08/17/2017. Stroke, Arrythmias:Atrial Fibrillation; Risk                 Factors:Hypertension, Dyslipidemia and Former Smoker.  Sonographer:    Wenda Low Referring Phys: [place] Comments: Technically difficult study due to poor echo windows. IMPRESSIONS  1. Left ventricular ejection fraction, by estimation, is 20 to 25%. The left ventricle has severely decreased function. The left ventricle demonstrates global hypokinesis. The left ventricular internal cavity size was moderately dilated. There is mild concentric left ventricular hypertrophy. Left ventricular diastolic parameters are consistent with Grade III diastolic dysfunction (restrictive).  2. Right ventricular systolic function is mildly reduced. The right ventricular size is mildly-to-moderately enlarged. There is moderately elevated pulmonary artery systolic pressure. The estimated right ventricular systolic pressure is 40.9 mmHg.  3. Left atrial size was severely dilated.  4. Right atrial size was severely dilated.  5. The mitral valve is abnormal. Mild mitral valve regurgitation. Moderate mitral  annular calcification.  6. Tricuspid valve regurgitation is mild to moderate.  7. The aortic valve is tricuspid. There is moderate calcification of the aortic valve. There is moderate thickening of the aortic valve. Aortic valve regurgitation is trivial.  8. Aortic dilatation noted. There is mild dilatation of the aortic root, measuring 38 mm.  9. The inferior vena cava is dilated in size with <50% respiratory variability, suggesting right atrial pressure of 15 mmHg. Comparison(s): Compared to prior TTE in 2019, the LVEF is no severely reduced at 20-25% (previously 45-50%). FINDINGS  Left Ventricle: Left ventricular ejection fraction, by estimation, is 20 to 25%. The left ventricle has severely decreased function. The left ventricle demonstrates global hypokinesis. Definity contrast agent was given IV to delineate the left ventricular endocardial borders. The left ventricular internal cavity size was moderately dilated. There is mild concentric left ventricular hypertrophy. Left ventricular diastolic parameters are consistent with Grade III diastolic dysfunction (restrictive). Right Ventricle: The right ventricular size is mildly-to-moderately enlarged. Right vetricular wall thickness was not well visualized. Right ventricular systolic function is mildly reduced. There is moderately elevated pulmonary artery systolic pressure.  The tricuspid regurgitant velocity is 2.90 m/s, and with an assumed right atrial pressure of 15 mmHg, the estimated right ventricular systolic pressure is 81.1 mmHg. Left Atrium: Left atrial size was severely dilated. Right Atrium: Right atrial size was severely dilated. Pericardium: There is no evidence of pericardial effusion. Mitral Valve: The mitral valve is abnormal. There is mild thickening of the mitral valve leaflet(s). There is mild calcification of the mitral valve leaflet(s). Moderate mitral annular calcification. Mild mitral valve regurgitation. MV peak gradient, 5.2  mmHg. The  mean mitral valve gradient is 1.0 mmHg. Tricuspid Valve: The tricuspid valve is normal in structure. Tricuspid valve regurgitation is mild to moderate. Aortic Valve: Suspect moderate low flow, low gradient aortic stenosis with AVA 0.91cm2, mean gradient 67mmHg, Vmax 2.22m/s, DI 0.3, SVI 24. The aortic valve is tricuspid. There is moderate calcification of the aortic valve. There is moderate thickening of the aortic valve. Aortic valve regurgitation is trivial. Aortic valve mean gradient measures 13.0 mmHg. Aortic valve peak gradient measures 25.2 mmHg. Aortic valve area, by VTI measures  0.91 cm. Pulmonic Valve: The pulmonic valve was normal in structure. Pulmonic valve regurgitation is mild. Aorta: Aortic dilatation noted. There is mild dilatation of the aortic root, measuring 38 mm. Venous: The inferior vena cava is dilated in size with less than 50% respiratory variability, suggesting right atrial pressure of 15 mmHg. IAS/Shunts: The atrial septum is grossly normal.  LEFT VENTRICLE PLAX 2D LVIDd:         6.10 cm   Diastology LVIDs:         5.40 cm   LV e' medial:    3.60 cm/s LV PW:         1.20 cm   LV E/e' medial:  28.1 LV IVS:        1.40 cm   LV e' lateral:   6.70 cm/s LVOT diam:     2.00 cm   LV E/e' lateral: 15.1 LV SV:         50 LV SV Index:   23 LVOT Area:     3.14 cm  RIGHT VENTRICLE RV Basal diam:  4.00 cm RV Mid diam:    3.80 cm RV S prime:     6.13 cm/s TAPSE (M-mode): 1.8 cm LEFT ATRIUM              Index        RIGHT ATRIUM           Index LA diam:        4.50 cm  2.09 cm/m   RA Area:     30.80 cm LA Vol (A2C):   116.0 ml 53.95 ml/m  RA Volume:   110.00 ml 51.16 ml/m LA Vol (A4C):   105.0 ml 48.83 ml/m LA Biplane Vol: 113.0 ml 52.55 ml/m  AORTIC VALVE                     PULMONIC VALVE AV Area (Vmax):    0.94 cm      PV Vmax:       0.54 m/s AV Area (Vmean):   0.89 cm      PV Peak grad:  1.2 mmHg AV Area (VTI):     0.91 cm AV Vmax:           251.00 cm/s AV Vmean:          168.000 cm/s AV  VTI:            0.547 m AV Peak Grad:      25.2 mmHg AV Mean Grad:      13.0 mmHg LVOT Vmax:         75.10 cm/s LVOT Vmean:        47.800 cm/s LVOT VTI:          0.158 m LVOT/AV VTI ratio: 0.29  AORTA Ao Root diam: 3.80 cm Ao Asc diam:  3.20 cm MITRAL VALVE                TRICUSPID VALVE MV Area (PHT): 4.89 cm     TR Peak grad:   33.6 mmHg MV Area VTI:   2.20 cm     TR Vmax:        290.00 cm/s MV Peak grad:  5.2 mmHg MV Mean grad:  1.0 mmHg     SHUNTS MV Vmax:       1.14 m/s     Systemic VTI:  0.16 m MV Vmean:      54.2 cm/s    Systemic Diam:  2.00 cm MV Decel Time: 155 msec MV E velocity: 101.00 cm/s MV A velocity: 31.30 cm/s MV E/A ratio:  3.23 Gwyndolyn Kaufman MD Electronically signed by Gwyndolyn Kaufman MD Signature Date/Time: 09/11/2021/4:45:31 PM    Final     Labs:  CBC: Recent Labs    09/13/21 1200 09/14/21 0609 09/15/21 0808 09/25/21 1433  WBC 5.9 5.6 5.2 5.4  HGB 13.0 12.4* 12.1* 12.5*  HCT 40.1 38.5* 37.8* 38.1*  PLT 161 127* 132* 189    COAGS: Recent Labs    09/11/21 0454 09/25/21 1433  INR  --  1.3*  APTT 32  --     BMP: Recent Labs    09/12/21 0544 09/13/21 0548 09/14/21 0609 09/15/21 0808 09/25/21 1433  NA 138 136 138 137 143  K 5.2* 4.2 4.3 4.4 4.5  CL 108 106 106 108 110  CO2 22 24 22 22 23   GLUCOSE 98 105* 101* 105* 97  BUN 41* 41* 40* 40* 47*  CALCIUM 8.9 8.6* 8.9 8.7* 9.4  CREATININE 2.15* 2.42* 2.26* 2.40* 2.55*  GFRNONAA 29* 25* 27* 25*  --     LIVER FUNCTION TESTS: Recent Labs    09/10/21 1907 09/11/21 0454 09/12/21 0544 09/14/21 0609 09/15/21 0808 09/25/21 1433  BILITOT 1.5* 1.3* 1.2  --   --  1.6*  AST 42* 36 34  --   --  34  ALT 28 25 23   --   --  24  ALKPHOS 84 73 67  --   --   --   PROT 6.7 6.0* 5.9*  --   --  6.0*  ALBUMIN 3.4* 3.0* 2.9* 2.9* 2.8*  --     TUMOR MARKERS: Recent Labs    09/25/21 1433  AFPTM 1.7    Assessment and Plan:  Ronald Colon is a 88 y.o. male with past medical history significant for  hypertension, hyperlipidemia, chronic renal insufficiency, atrial fibrillation (on Eliquis), small bowel AVM and chronic anemia who is seen today in telemedicine consultation for evaluation for potential percutaneous management of chronic left upper quadrant abdominal pain and concern for chronic mesenteric ischemia.  Following examinations were personally reviewed in detail:  Abdominal MRA - 10/06/2021 CT abdomen and pelvis - 09/10/2021  Personal review of abdominal MRA performed 10/06/2021 suggests hemodynamically significant narrowing involving the origin and proximal aspect of the celiac artery as well as the IMA with patency of the SMA.    Re-demonstrated small bilateral effusions and associated small to moderate volume intra-abdominal ascites, progressed compared to abdominal CT performed 09/10/2021.  There is mild nodularity hepatic contour compatible with known history of cirrhosis.  Review of noncontrast CT scan abdomen pelvis performed 09/10/2021 demonstrates a moderate to large amount of slightly irregular prominently calcified atherosclerotic plaque throughout the abdominal aorta as well as the bilateral common iliac arteries and the right common femoral artery, suboptimally evaluated secondary to lack of intravenous contrast.  The patient has a nontypical presentation for chronic mesenteric ischemia.  Specifically, there is no definitive association with his abdominal discomfort and the ingestion of food.  Additionally, his described burning abdominal sensation could also be associated with the worsening intra-abdominal ascites as was demonstrated on recent abdominal MRI performed 10/06/2021, worsening compared to the 09/10/2021 examination.  Given the patient's advanced age as well as multiple medical comorbidities, including to chronic congestive heart failure, atrial fibrillation (on Eliquis), cirrhosis and chronic renal insufficiency, the patient is a poor candidate for any significantly  invasive procedure.  As such  and given the nonspecificity of his abdominal complaints, we will first proceed with the less invasive attempted image guided paracentesis.    If patient's abdominal discomfort, nausea and vomiting are improved following the paracentesis this would argue against the presence of a clinically significant hemodynamically significant narrowing.  If the patient symptomatology is unaffected by a paracentesis, further conversations will be held with the patient and the patient's family regarding the benefits and risks of catheter directed mesenteric angiogram with potential intervention.  Given the patient's renal insufficiency, the majority of the mesenteric arteriogram would likely be performed with CO2 though a small amount of contrast would likely be necessary during the procedure.  (Note, per my conversation with Ronald Colon, the patient has been able to hold Eliquis for previous procedures.)  The patient's daughter-in-law, Ronald Colon, demonstrated excellent understanding of the above conversation and is agreement with the proposed plan of care.  Plan: - Proceed with ultrasound-guided paracentesis at [facility] at the next available appointment.  Note, this was discussed with providing interventional radiology physician assistant, Jannifer Franklin, who will attempt to arrange for this procedure to be performed tomorrow, 3/2. - Follow-up telemedicine consultation will be performed 1 to 2 days following the paracentesis to evaluate for patient's subjective improvement in abdominal pain prior to the potential of any ascitic reaccumulation.  Thank you for this interesting consult.  I greatly enjoyed meeting Ronald Colon and look forward to participating in their care.  A copy of this report was sent to the requesting provider on this date.  Electronically Signed: Sandi Mariscal 10/11/2021, 11:55 AM   I spent a total of 15 Minutes in remote  clinical consultation, greater than 50% of  which was counseling/coordinating care for abdominal pain, concern for mesenteric ischemia.    Visit type: Audio only (telephone). Audio (no video) only due to patient's lack of internet/smartphone capability. Alternative for in-person consultation at [facility], [place] [address] [place], [place], [place]. This visit type was conducted due to national recommendations for restrictions regarding the COVID-19 Pandemic (e.g. social distancing).  This format is felt to be most appropriate for this patient at this time.  All issues noted in this document were discussed and addressed.

## 2021-10-11 NOTE — Progress Notes (Signed)
error 

## 2021-10-12 ENCOUNTER — Ambulatory Visit (HOSPITAL_COMMUNITY)
Admission: RE | Admit: 2021-10-12 | Discharge: 2021-10-12 | Disposition: A | Discharge status: Home / Self Care | Payer: Medicare Other | Source: Ambulatory Visit | Attending: Interventional Radiology | Admitting: Interventional Radiology

## 2021-10-12 ENCOUNTER — Other Ambulatory Visit: Payer: Self-pay

## 2021-10-12 ENCOUNTER — Encounter (HOSPITAL_COMMUNITY): Payer: Self-pay

## 2021-10-12 DIAGNOSIS — R1084 Generalized abdominal pain: Secondary | ICD-10-CM | POA: Insufficient documentation

## 2021-10-12 DIAGNOSIS — R188 Other ascites: Secondary | ICD-10-CM | POA: Diagnosis not present

## 2021-10-12 NOTE — Progress Notes (Signed)
Patient tolerated right sided paracentesis procedure well today and 2.6 Liters of ascites removed. PT transported back to waiting area at this time via wheelchair to son and son/patient verbalized understanding of discharge instructions. PT vital signs remained stable and no acute distress noted at discharge.  ?

## 2021-10-12 NOTE — Procedures (Signed)
? ?  US guided RLQ paracentesis ? ?2.6 L yellow fluid  ?No labs per MD ?Ebl: none ? ?Tolerated well ? ?

## 2021-10-16 ENCOUNTER — Other Ambulatory Visit: Payer: Self-pay | Admitting: Interventional Radiology

## 2021-10-16 DIAGNOSIS — G8929 Other chronic pain: Secondary | ICD-10-CM

## 2021-10-17 DIAGNOSIS — K7460 Unspecified cirrhosis of liver: Secondary | ICD-10-CM | POA: Diagnosis not present

## 2021-10-17 DIAGNOSIS — I5042 Chronic combined systolic (congestive) and diastolic (congestive) heart failure: Secondary | ICD-10-CM | POA: Diagnosis not present

## 2021-10-17 DIAGNOSIS — N184 Chronic kidney disease, stage 4 (severe): Secondary | ICD-10-CM | POA: Diagnosis not present

## 2021-10-17 DIAGNOSIS — Z515 Encounter for palliative care: Secondary | ICD-10-CM | POA: Diagnosis not present

## 2021-10-18 ENCOUNTER — Telehealth: Payer: Self-pay | Admitting: *Deleted

## 2021-10-18 DIAGNOSIS — I509 Heart failure, unspecified: Secondary | ICD-10-CM | POA: Diagnosis not present

## 2021-10-18 DIAGNOSIS — M1990 Unspecified osteoarthritis, unspecified site: Secondary | ICD-10-CM | POA: Diagnosis not present

## 2021-10-18 DIAGNOSIS — K7460 Unspecified cirrhosis of liver: Secondary | ICD-10-CM | POA: Diagnosis not present

## 2021-10-18 DIAGNOSIS — I10 Essential (primary) hypertension: Secondary | ICD-10-CM | POA: Diagnosis not present

## 2021-10-18 DIAGNOSIS — I639 Cerebral infarction, unspecified: Secondary | ICD-10-CM | POA: Diagnosis not present

## 2021-10-18 DIAGNOSIS — N184 Chronic kidney disease, stage 4 (severe): Secondary | ICD-10-CM | POA: Diagnosis not present

## 2021-10-18 DIAGNOSIS — I4891 Unspecified atrial fibrillation: Secondary | ICD-10-CM | POA: Diagnosis not present

## 2021-10-18 DIAGNOSIS — K314 Gastric diverticulum: Secondary | ICD-10-CM | POA: Diagnosis not present

## 2021-10-18 DIAGNOSIS — E785 Hyperlipidemia, unspecified: Secondary | ICD-10-CM | POA: Diagnosis not present

## 2021-10-18 DIAGNOSIS — K219 Gastro-esophageal reflux disease without esophagitis: Secondary | ICD-10-CM | POA: Diagnosis not present

## 2021-10-18 DIAGNOSIS — R1110 Vomiting, unspecified: Secondary | ICD-10-CM | POA: Diagnosis not present

## 2021-10-18 DIAGNOSIS — M109 Gout, unspecified: Secondary | ICD-10-CM | POA: Diagnosis not present

## 2021-10-18 DIAGNOSIS — D509 Iron deficiency anemia, unspecified: Secondary | ICD-10-CM | POA: Diagnosis not present

## 2021-10-18 NOTE — Telephone Encounter (Signed)
Patient wife called and states she and her husband/family has talked it over and have decided not to pursue treatment at this time for chronic mesenteric ischemia.  ?I did advise to call if they change their mind./vm ?

## 2021-10-19 ENCOUNTER — Telehealth: Payer: Medicare Other

## 2021-10-19 DIAGNOSIS — K219 Gastro-esophageal reflux disease without esophagitis: Secondary | ICD-10-CM | POA: Diagnosis not present

## 2021-10-19 DIAGNOSIS — N184 Chronic kidney disease, stage 4 (severe): Secondary | ICD-10-CM | POA: Diagnosis not present

## 2021-10-19 DIAGNOSIS — I4891 Unspecified atrial fibrillation: Secondary | ICD-10-CM | POA: Diagnosis not present

## 2021-10-19 DIAGNOSIS — I10 Essential (primary) hypertension: Secondary | ICD-10-CM | POA: Diagnosis not present

## 2021-10-19 DIAGNOSIS — K7460 Unspecified cirrhosis of liver: Secondary | ICD-10-CM | POA: Diagnosis not present

## 2021-10-19 DIAGNOSIS — I509 Heart failure, unspecified: Secondary | ICD-10-CM | POA: Diagnosis not present

## 2021-10-20 DIAGNOSIS — K219 Gastro-esophageal reflux disease without esophagitis: Secondary | ICD-10-CM | POA: Diagnosis not present

## 2021-10-20 DIAGNOSIS — R5381 Other malaise: Secondary | ICD-10-CM | POA: Diagnosis not present

## 2021-10-20 DIAGNOSIS — R531 Weakness: Secondary | ICD-10-CM | POA: Diagnosis not present

## 2021-10-20 DIAGNOSIS — N184 Chronic kidney disease, stage 4 (severe): Secondary | ICD-10-CM | POA: Diagnosis not present

## 2021-10-20 DIAGNOSIS — K7460 Unspecified cirrhosis of liver: Secondary | ICD-10-CM | POA: Diagnosis not present

## 2021-10-20 DIAGNOSIS — I10 Essential (primary) hypertension: Secondary | ICD-10-CM | POA: Diagnosis not present

## 2021-10-20 DIAGNOSIS — I4891 Unspecified atrial fibrillation: Secondary | ICD-10-CM | POA: Diagnosis not present

## 2021-10-20 DIAGNOSIS — I509 Heart failure, unspecified: Secondary | ICD-10-CM | POA: Diagnosis not present

## 2021-10-21 DIAGNOSIS — I4891 Unspecified atrial fibrillation: Secondary | ICD-10-CM | POA: Diagnosis not present

## 2021-10-21 DIAGNOSIS — K7460 Unspecified cirrhosis of liver: Secondary | ICD-10-CM | POA: Diagnosis not present

## 2021-10-21 DIAGNOSIS — K219 Gastro-esophageal reflux disease without esophagitis: Secondary | ICD-10-CM | POA: Diagnosis not present

## 2021-10-21 DIAGNOSIS — I509 Heart failure, unspecified: Secondary | ICD-10-CM | POA: Diagnosis not present

## 2021-10-21 DIAGNOSIS — N184 Chronic kidney disease, stage 4 (severe): Secondary | ICD-10-CM | POA: Diagnosis not present

## 2021-10-21 DIAGNOSIS — I10 Essential (primary) hypertension: Secondary | ICD-10-CM | POA: Diagnosis not present

## 2021-10-22 DIAGNOSIS — K7460 Unspecified cirrhosis of liver: Secondary | ICD-10-CM | POA: Diagnosis not present

## 2021-10-22 DIAGNOSIS — N184 Chronic kidney disease, stage 4 (severe): Secondary | ICD-10-CM | POA: Diagnosis not present

## 2021-10-22 DIAGNOSIS — I4891 Unspecified atrial fibrillation: Secondary | ICD-10-CM | POA: Diagnosis not present

## 2021-10-22 DIAGNOSIS — K219 Gastro-esophageal reflux disease without esophagitis: Secondary | ICD-10-CM | POA: Diagnosis not present

## 2021-10-22 DIAGNOSIS — I10 Essential (primary) hypertension: Secondary | ICD-10-CM | POA: Diagnosis not present

## 2021-10-22 DIAGNOSIS — I509 Heart failure, unspecified: Secondary | ICD-10-CM | POA: Diagnosis not present

## 2021-10-23 DIAGNOSIS — N184 Chronic kidney disease, stage 4 (severe): Secondary | ICD-10-CM | POA: Diagnosis not present

## 2021-10-23 DIAGNOSIS — K7460 Unspecified cirrhosis of liver: Secondary | ICD-10-CM | POA: Diagnosis not present

## 2021-10-23 DIAGNOSIS — N189 Chronic kidney disease, unspecified: Secondary | ICD-10-CM | POA: Diagnosis not present

## 2021-10-23 DIAGNOSIS — I10 Essential (primary) hypertension: Secondary | ICD-10-CM | POA: Diagnosis not present

## 2021-10-23 DIAGNOSIS — I509 Heart failure, unspecified: Secondary | ICD-10-CM | POA: Diagnosis not present

## 2021-10-23 DIAGNOSIS — I4891 Unspecified atrial fibrillation: Secondary | ICD-10-CM | POA: Diagnosis not present

## 2021-10-23 DIAGNOSIS — K219 Gastro-esophageal reflux disease without esophagitis: Secondary | ICD-10-CM | POA: Diagnosis not present

## 2021-10-23 DIAGNOSIS — I130 Hypertensive heart and chronic kidney disease with heart failure and stage 1 through stage 4 chronic kidney disease, or unspecified chronic kidney disease: Secondary | ICD-10-CM | POA: Diagnosis not present

## 2021-10-23 DIAGNOSIS — I5023 Acute on chronic systolic (congestive) heart failure: Secondary | ICD-10-CM | POA: Diagnosis not present

## 2021-10-23 DIAGNOSIS — K5732 Diverticulitis of large intestine without perforation or abscess without bleeding: Secondary | ICD-10-CM | POA: Diagnosis not present

## 2021-10-24 DIAGNOSIS — N184 Chronic kidney disease, stage 4 (severe): Secondary | ICD-10-CM | POA: Diagnosis not present

## 2021-10-24 DIAGNOSIS — I509 Heart failure, unspecified: Secondary | ICD-10-CM | POA: Diagnosis not present

## 2021-10-24 DIAGNOSIS — K219 Gastro-esophageal reflux disease without esophagitis: Secondary | ICD-10-CM | POA: Diagnosis not present

## 2021-10-24 DIAGNOSIS — I4891 Unspecified atrial fibrillation: Secondary | ICD-10-CM | POA: Diagnosis not present

## 2021-10-24 DIAGNOSIS — K7460 Unspecified cirrhosis of liver: Secondary | ICD-10-CM | POA: Diagnosis not present

## 2021-10-24 DIAGNOSIS — I10 Essential (primary) hypertension: Secondary | ICD-10-CM | POA: Diagnosis not present

## 2021-10-25 DIAGNOSIS — I10 Essential (primary) hypertension: Secondary | ICD-10-CM | POA: Diagnosis not present

## 2021-10-25 DIAGNOSIS — I4891 Unspecified atrial fibrillation: Secondary | ICD-10-CM | POA: Diagnosis not present

## 2021-10-25 DIAGNOSIS — K7460 Unspecified cirrhosis of liver: Secondary | ICD-10-CM | POA: Diagnosis not present

## 2021-10-25 DIAGNOSIS — I509 Heart failure, unspecified: Secondary | ICD-10-CM | POA: Diagnosis not present

## 2021-10-25 DIAGNOSIS — K219 Gastro-esophageal reflux disease without esophagitis: Secondary | ICD-10-CM | POA: Diagnosis not present

## 2021-10-25 DIAGNOSIS — N184 Chronic kidney disease, stage 4 (severe): Secondary | ICD-10-CM | POA: Diagnosis not present

## 2021-10-26 DIAGNOSIS — I509 Heart failure, unspecified: Secondary | ICD-10-CM | POA: Diagnosis not present

## 2021-10-26 DIAGNOSIS — K7460 Unspecified cirrhosis of liver: Secondary | ICD-10-CM | POA: Diagnosis not present

## 2021-10-26 DIAGNOSIS — K219 Gastro-esophageal reflux disease without esophagitis: Secondary | ICD-10-CM | POA: Diagnosis not present

## 2021-10-26 DIAGNOSIS — I4891 Unspecified atrial fibrillation: Secondary | ICD-10-CM | POA: Diagnosis not present

## 2021-10-26 DIAGNOSIS — N184 Chronic kidney disease, stage 4 (severe): Secondary | ICD-10-CM | POA: Diagnosis not present

## 2021-10-26 DIAGNOSIS — I10 Essential (primary) hypertension: Secondary | ICD-10-CM | POA: Diagnosis not present

## 2021-10-27 DIAGNOSIS — N184 Chronic kidney disease, stage 4 (severe): Secondary | ICD-10-CM | POA: Diagnosis not present

## 2021-10-27 DIAGNOSIS — I4891 Unspecified atrial fibrillation: Secondary | ICD-10-CM | POA: Diagnosis not present

## 2021-10-27 DIAGNOSIS — K7460 Unspecified cirrhosis of liver: Secondary | ICD-10-CM | POA: Diagnosis not present

## 2021-10-27 DIAGNOSIS — I10 Essential (primary) hypertension: Secondary | ICD-10-CM | POA: Diagnosis not present

## 2021-10-27 DIAGNOSIS — I509 Heart failure, unspecified: Secondary | ICD-10-CM | POA: Diagnosis not present

## 2021-10-27 DIAGNOSIS — K219 Gastro-esophageal reflux disease without esophagitis: Secondary | ICD-10-CM | POA: Diagnosis not present

## 2021-11-11 DEATH — deceased

## 2021-11-20 ENCOUNTER — Ambulatory Visit (INDEPENDENT_AMBULATORY_CARE_PROVIDER_SITE_OTHER): Payer: Medicare Other | Admitting: Gastroenterology

## 2021-12-25 ENCOUNTER — Ambulatory Visit (INDEPENDENT_AMBULATORY_CARE_PROVIDER_SITE_OTHER): Payer: Medicare Other | Admitting: Gastroenterology

## 2022-01-15 ENCOUNTER — Ambulatory Visit (INDEPENDENT_AMBULATORY_CARE_PROVIDER_SITE_OTHER): Payer: Medicare Other | Admitting: Gastroenterology
